# Patient Record
Sex: Female | Born: 1975 | Race: White | Hispanic: No | Marital: Married | Smoking: Current some day smoker
Health system: Southern US, Community
[De-identification: ages and names within clinical notes are randomized; demographics above are authoritative.]

## PROBLEM LIST (undated history)

## (undated) ENCOUNTER — Inpatient Hospital Stay (HOSPITAL_COMMUNITY): Payer: Self-pay

## (undated) DIAGNOSIS — E119 Type 2 diabetes mellitus without complications: Secondary | ICD-10-CM

## (undated) DIAGNOSIS — R569 Unspecified convulsions: Secondary | ICD-10-CM

## (undated) HISTORY — DX: Type 2 diabetes mellitus without complications: E11.9

---

## 2012-07-09 ENCOUNTER — Emergency Department: Payer: Self-pay | Admitting: Emergency Medicine

## 2012-08-19 ENCOUNTER — Encounter (HOSPITAL_COMMUNITY): Payer: Self-pay | Admitting: Emergency Medicine

## 2012-08-19 ENCOUNTER — Emergency Department (HOSPITAL_COMMUNITY)
Admission: EM | Admit: 2012-08-19 | Discharge: 2012-08-19 | Disposition: A | Discharge status: Home / Self Care | Payer: Medicare Other | Attending: Emergency Medicine | Admitting: Emergency Medicine

## 2012-08-19 ENCOUNTER — Emergency Department (HOSPITAL_COMMUNITY): Payer: Medicare Other

## 2012-08-19 DIAGNOSIS — R110 Nausea: Secondary | ICD-10-CM | POA: Insufficient documentation

## 2012-08-19 DIAGNOSIS — Z3490 Encounter for supervision of normal pregnancy, unspecified, unspecified trimester: Secondary | ICD-10-CM

## 2012-08-19 DIAGNOSIS — R109 Unspecified abdominal pain: Secondary | ICD-10-CM

## 2012-08-19 DIAGNOSIS — Z8669 Personal history of other diseases of the nervous system and sense organs: Secondary | ICD-10-CM | POA: Insufficient documentation

## 2012-08-19 DIAGNOSIS — Z794 Long term (current) use of insulin: Secondary | ICD-10-CM | POA: Insufficient documentation

## 2012-08-19 DIAGNOSIS — Z3201 Encounter for pregnancy test, result positive: Secondary | ICD-10-CM | POA: Insufficient documentation

## 2012-08-19 DIAGNOSIS — O9989 Other specified diseases and conditions complicating pregnancy, childbirth and the puerperium: Secondary | ICD-10-CM | POA: Insufficient documentation

## 2012-08-19 HISTORY — DX: Unspecified convulsions: R56.9

## 2012-08-19 LAB — COMPREHENSIVE METABOLIC PANEL
BUN: 14 mg/dL (ref 6–23)
CO2: 20 mEq/L (ref 19–32)
Calcium: 9 mg/dL (ref 8.4–10.5)
Creatinine, Ser: 0.5 mg/dL (ref 0.50–1.10)
GFR calc Af Amer: >90 mL/min (ref >90)
GFR calc non Af Amer: >90 mL/min (ref >90)
Glucose, Bld: 277 mg/dL — ABNORMAL HIGH (ref 70–99)

## 2012-08-19 LAB — CBC WITH DIFFERENTIAL/PLATELET
Basophils Absolute: 0 10*3/uL (ref 0.0–0.1)
Eosinophils Relative: 1 % (ref 0–5)
HCT: 37.1 % (ref 36.0–46.0)
Lymphocytes Relative: 32 % (ref 12–46)
MCV: 80.1 fL (ref 78.0–100.0)
Monocytes Absolute: 0.5 10*3/uL (ref 0.1–1.0)
Monocytes Relative: 4 % (ref 3–12)
RDW: 12.6 % (ref 11.5–15.5)
WBC: 12.4 10*3/uL — ABNORMAL HIGH (ref 4.0–10.5)

## 2012-08-19 LAB — URINE MICROSCOPIC-ADD ON

## 2012-08-19 LAB — URINALYSIS, ROUTINE W REFLEX MICROSCOPIC
Nitrite: NEGATIVE
Protein, ur: 100 mg/dL — AB
Specific Gravity, Urine: 1.037 — ABNORMAL HIGH (ref 1.005–1.030)
Urobilinogen, UA: 0.2 mg/dL (ref 0.0–1.0)

## 2012-08-19 LAB — POCT PREGNANCY, URINE: Preg Test, Ur: POSITIVE — AB

## 2012-08-19 MED ORDER — ONDANSETRON HCL 4 MG/2ML IJ SOLN
4.0000 mg | Freq: Once | INTRAMUSCULAR | Status: AC
  Administered 2012-08-19: 4 mg via INTRAVENOUS
  Filled 2012-08-19: qty 2

## 2012-08-19 MED ORDER — SODIUM CHLORIDE 0.9 % IV BOLUS (SEPSIS)
1000.0000 mL | Freq: Once | INTRAVENOUS | Status: AC
  Administered 2012-08-19: 1000 mL via INTRAVENOUS

## 2012-08-19 MED ORDER — ACETAMINOPHEN-CODEINE #3 300-30 MG PO TABS: 1.0000 | ORAL_TABLET | Freq: Four times a day (QID) | ORAL | Status: DC | PRN

## 2012-08-19 MED ORDER — MORPHINE SULFATE 4 MG/ML IJ SOLN
4.0000 mg | Freq: Once | INTRAMUSCULAR | Status: AC
  Administered 2012-08-19: 4 mg via INTRAVENOUS
  Filled 2012-08-19: qty 1

## 2012-08-19 MED ORDER — ONDANSETRON 8 MG PO TBDP: 8.0000 mg | ORAL_TABLET | Freq: Three times a day (TID) | ORAL | Status: DC | PRN

## 2012-08-19 NOTE — ED Notes (Signed)
Pt c/o right sided headach for 3 days, unrelieved by ibuprofen. Pt also c/o right side abd pain, tender to palpation. Pt states she has vomited 2 times this morning. C/o mild nausea at this time.

## 2012-08-19 NOTE — ED Notes (Signed)
Pt advised of the wait time 

## 2012-08-19 NOTE — ED Provider Notes (Signed)
History     CSN: [id number]  Arrival date & time 08/19/12  1429   First MD Initiated Contact with Patient 08/19/12 1727      Chief Complaint  Patient presents with  . Abdominal Pain    (Consider location/radiation/quality/duration/timing/severity/associated sxs/prior treatment) HPI Comments: Pt comes in with cc of abd pain. Pt has hx of RUQ abd pain, started few days back - but now constant. There is some nausea, no emesis, and no uti like sx, no hx of renal stones, GERD, surgeries. The pain has bo specific aggravating or relieving factors. No hx of gall stones.  Patient is a 37 y.o. female presenting with abdominal pain. The history is provided by the patient.  Abdominal Pain Associated symptoms: nausea   Associated symptoms: no chest pain, no diarrhea, no dysuria, no shortness of breath and no vomiting     Past Medical History  Diagnosis Date  . Seizures     History reviewed. No pertinent past surgical history.  History reviewed. No pertinent family history.  History  Substance Use Topics  . Smoking status: Never Smoker   . Smokeless tobacco: Not on file  . Alcohol Use: No    OB History   Grav Para Term Preterm Abortions TAB SAB Ect Mult Living                  Review of Systems  Constitutional: Negative for activity change.  HENT: Negative for facial swelling and neck pain.   Respiratory: Negative for shortness of breath.   Cardiovascular: Negative for chest pain.  Gastrointestinal: Positive for nausea and abdominal pain. Negative for vomiting and diarrhea.  Genitourinary: Negative for dysuria.  Neurological: Negative for headaches.    Allergies  Dilantin; Levemir; Phenobarbital; Ciprofloxacin; Humalog; Lantus; Novolin 70-30; and Penicillins  Home Medications   Current Outpatient Rx  Name  Route  Sig  Dispense  Refill  . insulin glulisine (APIDRA) 100 UNIT/ML injection   Subcutaneous   Inject 30 Units into the skin 2 (two) times daily after a  meal.           BP 141/91  Pulse 109  Temp(Src) 98.5 F (36.9 C) (Oral)  Resp 18  SpO2 96%  Physical Exam  Nursing note and vitals reviewed. Constitutional: She is oriented to person, place, and time. She appears well-developed and well-nourished.  HENT:  Head: Normocephalic and atraumatic.  Eyes: EOM are normal. Pupils are equal, round, and reactive to light.  Neck: Neck supple.  Cardiovascular: Normal rate, regular rhythm and normal heart sounds.   No murmur heard. Pulmonary/Chest: Effort normal. No respiratory distress.  Abdominal: Soft. She exhibits no distension. There is tenderness. There is no rebound and no guarding.  RUQ tenderness, no murphy's.  Neurological: She is alert and oriented to person, place, and time.  Skin: Skin is warm and dry.    ED Course  Procedures (including critical care time)  Labs Reviewed  CBC WITH DIFFERENTIAL - Abnormal; Notable for the following:    WBC 12.4 (*)    Neutro Abs 7.8 (*)    All other components within normal limits  COMPREHENSIVE METABOLIC PANEL - Abnormal; Notable for the following:    Sodium 131 (*)    Glucose, Bld 277 (*)    Albumin 3.0 (*)    All other components within normal limits  URINALYSIS, ROUTINE W REFLEX MICROSCOPIC - Abnormal; Notable for the following:    APPearance CLOUDY (*)    Specific Gravity, Urine 1.037 (*)  Glucose, UA >1000 (*)    Protein, ur 100 (*)    All other components within normal limits  URINE MICROSCOPIC-ADD ON - Abnormal; Notable for the following:    Squamous Epithelial / LPF MANY (*)    Casts HYALINE CASTS (*)    All other components within normal limits  HCG, QUANTITATIVE, PREGNANCY - Abnormal; Notable for the following:    hCG, Beta Chain, Quant, S 9739 (*)    All other components within normal limits  POCT PREGNANCY, URINE - Abnormal; Notable for the following:    Preg Test, Ur POSITIVE (*)    All other components within normal limits   No results found.   No diagnosis  found.    MDM  DDx includes: Pancreatitis Hepatobiliary pathology including cholecystitis Gastritis/PUD SBO  Pt comes in with cc of RUQ pain. Her Upreg is also positive. She was last pregnant 17 years ago.  We will get [place] abd and pelvis.   Derwood Kaplan, MD 08/19/12 2003

## 2012-08-19 NOTE — ED Notes (Signed)
Pt c/o RLQ pain with N/V and HA x 3 days

## 2012-09-22 DIAGNOSIS — E109 Type 1 diabetes mellitus without complications: Secondary | ICD-10-CM | POA: Insufficient documentation

## 2012-09-22 DIAGNOSIS — G40909 Epilepsy, unspecified, not intractable, without status epilepticus: Secondary | ICD-10-CM | POA: Insufficient documentation

## 2012-09-27 ENCOUNTER — Encounter: Payer: Medicare Other | Admitting: Obstetrics and Gynecology

## 2012-10-04 ENCOUNTER — Encounter: Payer: Medicare Other | Admitting: Advanced Practice Midwife

## 2012-10-23 ENCOUNTER — Emergency Department: Payer: Self-pay | Admitting: Emergency Medicine

## 2012-10-23 LAB — COMPREHENSIVE METABOLIC PANEL
Anion Gap: 8 (ref 7–16)
BUN: 8 mg/dL (ref 7–18)
Bilirubin,Total: 0.3 mg/dL (ref 0.2–1.0)
Calcium, Total: 8.6 mg/dL (ref 8.5–10.1)
Chloride: 103 mmol/L (ref 98–107)
EGFR (Non-African Amer.): >60
Osmolality: 274 (ref 275–301)
SGOT(AST): 13 U/L — ABNORMAL LOW (ref 15–37)
Total Protein: 6.9 g/dL (ref 6.4–8.2)

## 2012-10-23 LAB — URINALYSIS, COMPLETE
Glucose,UR: >=500 mg/dL (ref 0–75)
Nitrite: NEGATIVE
Ph: 6 (ref 4.5–8.0)
Protein: NEGATIVE
WBC UR: 2 /HPF (ref 0–5)

## 2012-10-23 LAB — CBC
HCT: 34 % — ABNORMAL LOW (ref 35.0–47.0)
Platelet: 367 10*3/uL (ref 150–440)

## 2012-10-25 ENCOUNTER — Other Ambulatory Visit (HOSPITAL_COMMUNITY)
Admission: RE | Admit: 2012-10-25 | Discharge: 2012-10-25 | Disposition: A | Discharge status: Home / Self Care | Payer: Medicare Other | Source: Ambulatory Visit | Attending: Obstetrics & Gynecology | Admitting: Obstetrics & Gynecology

## 2012-10-25 ENCOUNTER — Other Ambulatory Visit: Payer: Self-pay | Admitting: Obstetrics & Gynecology

## 2012-10-25 ENCOUNTER — Encounter: Payer: Self-pay | Admitting: Obstetrics & Gynecology

## 2012-10-25 ENCOUNTER — Encounter (HOSPITAL_COMMUNITY): Payer: Self-pay | Admitting: Obstetrics & Gynecology

## 2012-10-25 ENCOUNTER — Ambulatory Visit (INDEPENDENT_AMBULATORY_CARE_PROVIDER_SITE_OTHER): Payer: Medicaid Other | Admitting: Obstetrics & Gynecology

## 2012-10-25 VITALS — BP 150/97 | Temp 97.3°F | Ht 63.0 in | Wt 194.2 lb

## 2012-10-25 DIAGNOSIS — O10919 Unspecified pre-existing hypertension complicating pregnancy, unspecified trimester: Secondary | ICD-10-CM | POA: Insufficient documentation

## 2012-10-25 DIAGNOSIS — O26899 Other specified pregnancy related conditions, unspecified trimester: Secondary | ICD-10-CM | POA: Insufficient documentation

## 2012-10-25 DIAGNOSIS — IMO0002 Reserved for concepts with insufficient information to code with codable children: Secondary | ICD-10-CM | POA: Insufficient documentation

## 2012-10-25 DIAGNOSIS — O09529 Supervision of elderly multigravida, unspecified trimester: Secondary | ICD-10-CM

## 2012-10-25 DIAGNOSIS — Z01419 Encounter for gynecological examination (general) (routine) without abnormal findings: Secondary | ICD-10-CM | POA: Insufficient documentation

## 2012-10-25 DIAGNOSIS — Z113 Encounter for screening for infections with a predominantly sexual mode of transmission: Secondary | ICD-10-CM | POA: Insufficient documentation

## 2012-10-25 DIAGNOSIS — O0992 Supervision of high risk pregnancy, unspecified, second trimester: Secondary | ICD-10-CM

## 2012-10-25 DIAGNOSIS — O34219 Maternal care for unspecified type scar from previous cesarean delivery: Secondary | ICD-10-CM | POA: Insufficient documentation

## 2012-10-25 DIAGNOSIS — E118 Type 2 diabetes mellitus with unspecified complications: Secondary | ICD-10-CM

## 2012-10-25 DIAGNOSIS — O219 Vomiting of pregnancy, unspecified: Secondary | ICD-10-CM

## 2012-10-25 DIAGNOSIS — B9789 Other viral agents as the cause of diseases classified elsewhere: Secondary | ICD-10-CM

## 2012-10-25 DIAGNOSIS — O24312 Unspecified pre-existing diabetes mellitus in pregnancy, second trimester: Secondary | ICD-10-CM

## 2012-10-25 DIAGNOSIS — O9989 Other specified diseases and conditions complicating pregnancy, childbirth and the puerperium: Secondary | ICD-10-CM

## 2012-10-25 DIAGNOSIS — O10912 Unspecified pre-existing hypertension complicating pregnancy, second trimester: Secondary | ICD-10-CM

## 2012-10-25 DIAGNOSIS — O10019 Pre-existing essential hypertension complicating pregnancy, unspecified trimester: Secondary | ICD-10-CM

## 2012-10-25 DIAGNOSIS — O9853 Other viral diseases complicating the puerperium: Secondary | ICD-10-CM

## 2012-10-25 DIAGNOSIS — O24919 Unspecified diabetes mellitus in pregnancy, unspecified trimester: Secondary | ICD-10-CM

## 2012-10-25 DIAGNOSIS — Z1151 Encounter for screening for human papillomavirus (HPV): Secondary | ICD-10-CM | POA: Insufficient documentation

## 2012-10-25 DIAGNOSIS — IMO0001 Reserved for inherently not codable concepts without codable children: Secondary | ICD-10-CM

## 2012-10-25 DIAGNOSIS — R519 Headache, unspecified: Secondary | ICD-10-CM

## 2012-10-25 DIAGNOSIS — O26892 Other specified pregnancy related conditions, second trimester: Secondary | ICD-10-CM

## 2012-10-25 DIAGNOSIS — O24319 Unspecified pre-existing diabetes mellitus in pregnancy, unspecified trimester: Secondary | ICD-10-CM | POA: Insufficient documentation

## 2012-10-25 DIAGNOSIS — O09522 Supervision of elderly multigravida, second trimester: Secondary | ICD-10-CM

## 2012-10-25 LAB — CBC
MCH: 28.6 pg (ref 26.0–34.0)
MCV: 82.5 fL (ref 78.0–100.0)
Platelets: 380 10*3/uL (ref 150–400)
RBC: 4.05 MIL/uL (ref 3.87–5.11)
RDW: 13.7 % (ref 11.5–15.5)

## 2012-10-25 LAB — POCT URINALYSIS DIP (DEVICE)
Bilirubin Urine: NEGATIVE
Glucose, UA: 500 mg/dL — AB
Leukocytes, UA: NEGATIVE
Nitrite: NEGATIVE
Urobilinogen, UA: 0.2 mg/dL (ref 0.0–1.0)

## 2012-10-25 LAB — HEMOGLOBIN A1C: Hgb A1c MFr Bld: 10 % — ABNORMAL HIGH (ref <5.7)

## 2012-10-25 MED ORDER — BUTALBITAL-APAP-CAFFEINE 50-500-40 MG PO TABS: 1.0000 | ORAL_TABLET | ORAL | Status: DC | PRN

## 2012-10-25 MED ORDER — ONDANSETRON 8 MG PO TBDP: 8.0000 mg | ORAL_TABLET | Freq: Three times a day (TID) | ORAL | Status: DC | PRN

## 2012-10-25 NOTE — Progress Notes (Signed)
Nutrition note: 1st visit consult Pt has h/o diabetes and obesity. Pt has gained 10.2# @ [redacted]w[redacted]d, which is > expected. Pt reports eating 3 meals & 3 snacks/d. Pt is taking PNV. Pt reports having N&V and heartburn. NKFA. Pt reports walking most days.  Pt received verbal & written education on general nutrition during pregnancy. Disc tips to decrease N&V and heartburn. Disc wt gain goals of 11-20# or 0.5#/wk.  Pt agrees to follow DM diet with 3 meals & 3 snacks including proper CHO/ protein combination.  Pt has WIC in [place] & plans to BF. F/u in 2-4 wks Blondell Reveal, MS, RD, LDN

## 2012-10-25 NOTE — Progress Notes (Signed)
  Subjective:    Kristy Mcmillan is a 37 y.o. G2P1001 [redacted]w[redacted]d being seen today for her first obstetrical visit.  Her obstetrical history is significant for Type II diabetes diagnosed in 1996 after her previous pregnancy. She reports being allergic to many forms of insulin including NPH, Lantus, Novolog, Humalog or regular insulin.  Only tolerates Apidra (insulin glulisine) which is rapid acting like Novolog, as per [facility] May our RDE.   No current endocrinologist or PCP, she takes 30 units of Apidra twice a day and has variable range of blood sugars.  Also reports hisotry of headaches/migraines, only alleviated by Tylenol #3, wants a Rx of this..  Also wants Zofran as needed for nausea.  Patient does intend to breast feed. Pregnancy history fully reviewed.  Patient Active Problem List   Diagnosis Date Noted  . Preexisting diabetes complicating pregnancy, antepartum 10/25/2012  . Chronic hypertension complicating pregnancy, antepartum 10/25/2012  . Advanced maternal age, antepartum 10/25/2012  . Previous cesarean section complicating pregnancy, antepartum condition or complication 10/25/2012    HISTORY: OB History   Grav Para Term Preterm Abortions TAB SAB Ect Mult Living   2 1 1       1      # Outc Date GA Lbr Len/2nd Wgt Sex Del Anes PTL Lv   1 TRM 11/96 [redacted]w[redacted]d  6lb2oz(2.778kg)   None No Yes   2 CUR              Past Medical History  Diagnosis Date  . Seizures   . Diabetes mellitus without complication    Past Surgical History  Procedure Laterality Date  . Cesarean section     Family History  Problem Relation Age of Onset  . Diabetes Mother   . Diabetes Father    Exam    Uterus:     Pelvic Exam:    Perineum: No Hemorrhoids, Normal Perineum   Vulva: normal   Vagina:  normal mucosa, normal discharge   Cervix: no bleeding following Pap   Adnexa: normal adnexa and no mass, fullness, tenderness   Bony Pelvis: gynecoid  System: Breast:  normal appearance, no masses or tenderness    Skin: normal coloration and turgor, no rashes   Neurologic: oriented, normal   Extremities: normal strength, tone, and muscle mass   HEENT PERRLA   Mouth/Teeth mucous membranes moist, pharynx normal without lesions and dental hygiene good   Neck supple and no masses   Cardiovascular: regular rate and rhythm   Respiratory:  appears well, vitals normal, no respiratory distress, acyanotic, normal RR, chest clear, no wheezing, crepitations, rhonchi, normal symmetric air entry   Abdomen: soft, non-tender; bowel sounds normal; no masses,  no organomegaly   Urinary: urethral meatus normal      Assessment:    Pregnancy: G2P1001 Patient Active Problem List   Diagnosis Date Noted  . Preexisting diabetes complicating pregnancy, antepartum 10/25/2012  . Chronic hypertension complicating pregnancy, antepartum 10/25/2012  . Advanced maternal age, antepartum 10/25/2012  . Previous cesarean section complicating pregnancy, antepartum condition or complication 10/25/2012     Plan:     Initial labs drawn.  Continue Prenatal vitamins. Problem list reviewed and updated. Referred to Endocrinology for help in treating her DM given her allergies; may need insulin pump with Apidra if that is all she can tolerate.   Genetic Screening discussed; Harmony test ordered at MFM Ultrasound discussed; fetal survey: ordered. Follow up in 2 weeks.    Kristy Mcmillan A 10/25/2012

## 2012-10-25 NOTE — Progress Notes (Signed)
Patient checked her sugar this morning and it was 229.  She has a high blood pressure reading this morning and is c/o of extreme dizziness and extreme shaking.  HR= 107

## 2012-10-25 NOTE — Progress Notes (Signed)
ML on [facility] VM to call to set up an appointment for this patient.([phone])MFM appointment for [facility] and U/S scheduled 11/12/12 at 215 pm. Patient given contact information for Fetal Echo and Endocrinology and advised she will be called with these appointments. Patient given an appointment with Dr. Elvera Lennox ([place]) for 10/26/12 at 3 pm.

## 2012-10-25 NOTE — Patient Instructions (Addendum)
Vaginal Birth After Cesarean Delivery Vaginal birth after Cesarean delivery (VBAC) is giving birth vaginally after previously delivering a baby by a cesarean. In the past, if a woman had a Cesarean delivery, all births afterwards would be done by Cesarean delivery. This is no longer true. It can be safe for the mother to try a vaginal delivery after having a Cesarean. The final decision to have a VBAC or repeat Cesarean delivery should be between the patient and her caregiver. The risks and benefits can be discussed relative to the reason for, and the type of the previous Cesarean delivery. WOMEN WHO PLAN TO HAVE A VBAC SHOULD CHECK WITH THEIR DOCTOR TO BE SURE THAT:  The previous Cesarean was done with a low transverse uterine incision (not a vertical classical incision).  The birth canal is big enough for the baby.  There were no other operations on the uterus.  They will have an electronic fetal monitor (EFM) on at all times during labor.  An operating room would be available and ready in case an emergency Cesarean is needed.  A doctor and surgical nursing staff would be available at all times during labor to be ready to do an emergency Cesarean if necessary.  An anesthesiologist would be present in case an emergency Cesarean is needed.  The nursery is prepared and has adequate personnel and necessary equipment available to care for the baby in case of an emergency Cesarean. BENEFITS OF VBAC:  Shorter stay in the hospital.  Lower delivery, nursery and hospital costs.  Less blood loss and need for blood transfusions.  Less fever and discomfort from major surgery.  Lower risk of blood clots.  Lower risk of infection.  Shorter recovery after going home.  Lower risk of other surgical complications, such as opening of the incision or hernia in the incision.  Decreased risk of injury to other organs.  Decreased risk for having to remove the uterus (hysterectomy).  Decreased risk  for the placenta to completely or partially cover the opening of the uterus (placenta previa) with a future pregnancy.  Ability to have a larger family if desired. RISKS OF A VBAC:  Rupture of the uterus.  Having to remove the uterus (hysterectomy) if it ruptures.  All the complications of major surgery and/or injury to other organs.  Excessive bleeding, blood clots and infection.  Lower Apgar scores (method to evaluate the newborn based on appearance, pulse, grimace, activity, and respiration) and more risks to the baby.  There is a higher risk of uterine rupture if you induce or augment labor.  There is a higher risk of uterine rupture if you use medications to ripen the cervix. VBAC SHOULD NOT BE DONE IF:  The previous Cesarean was done with a vertical (classical) or T-shaped incision, or you do not know what kind of an incision was made.  You had a ruptured uterus.  You had surgery on your uterus.  You have medical or obstetrical problems.  There are problems with the baby.  There were two previous Cesarean deliveries and no vaginal deliveries. OTHER FACTS TO KNOW ABOUT VBAC:  It is safe to have an epidural anesthetic with VBAC.  It is safe to turn the baby from a breech position (attempt an external cephalic version).  It is safe to try a VBAC with twins.  Pregnancies later than 40 weeks have not been successful with VBAC.  There is an increased failure rate of a VBAC in obese pregnant women.  There is  an increased failure rate with VABC if the baby weighs 8.8 pounds (4000 grams) or more.  There is an increased failure rate if the time between the Cesarean and VBAC is less than 19 months.  There is an increased failure rate if pre-eclampsia is present (high blood pressure, protein in the urine and swelling of face and extremities).  VBAC is very successful if there was a previous vaginal birth.  VBAC is very successful when the labor starts spontaneously before  the due date.  Delivery of VBAC is similar to having a normal spontaneous vaginal delivery. It is important to discuss VBAC with your caregiver early in the pregnancy so you can understand the risks, benefits and options. It will give you time to decide what is best in your particular case relevant to the reason for your previous Cesarean delivery. It should be understood that medical changes in the mother or pregnancy may occur during the pregnancy, which make it necessary to change you or your caregiver's initial decision. The counseling, concerns and decisions should be documented in the medical record and signed by all parties. Document Released: 12/07/2006 Document Revised: 09/08/2011 Document Reviewed: 07/28/2008 [facility] Patient Information 2013 [place], [place].   Hypertension During Pregnancy Hypertension is also called high blood pressure. It can occur at any time in life and during pregnancy. When you have hypertension, there is extra pressure inside your blood vessels that carry blood from the heart to the rest of your body (arteries). Hypertension during pregnancy can cause problems for you and your baby. Your baby might not weigh as much as it should at birth or might be born early (premature). Very bad cases of hypertension during pregnancy can be life-threatening.  There are different types of hypertension during pregnancy.   Chronic hypertension. This happens when a woman has hypertension before pregnancy and it continues during pregnancy.  Gestational hypertension. This is when hypertension develops during pregnancy.  Preeclampsia or toxemia of pregnancy. This is a very serious type of hypertension that develops only during pregnancy. It is a disease that affects the whole body (systemic) and can be very dangerous for both mother and baby.  Gestational hypertension and preeclampsia usually go away after your baby is born. Blood pressure generally stabilizes within 6 weeks. Women  who have hypertension during pregnancy have a greater chance of developing hypertension later in life or with future pregnancies. UNDERSTANDING BLOOD PRESSURE Blood pressure moves blood in your body. Sometimes, the force that moves the blood becomes too strong.  A blood pressure reading is given in 2 numbers and looks like a fraction.  The top number is called the systolic pressure. When your heart beats, it forces more blood to flow through the arteries. Pressure inside the arteries goes up.  The bottom number is the diastolic pressure. Pressure goes down between beats. That is when the heart is resting.  You may have hypertension if:  Your systolic blood pressure is above 140.  Your diastolic pressure is above 90. RISK FACTORS Some factors make you more likely to develop hypertension during pregnancy. Risk factors include:  Having hypertension before pregnancy.  Having hypertension during a previous pregnancy.  Being overweight.  Being older than 40.  Being pregnant with more than 1 baby (multiples).  Having diabetes or kidney problems. SYMPTOMS Chronic and gestational hypertension may not cause symptoms. Preeclampsia has symptoms, which may include:  Increased protein in your urine. Your caregiver will check for this at every prenatal visit.  Swelling of your  hands and face.  Rapid weight gain.  Headaches.  Visual changes.  Being bothered by light.  Abdominal pain, especially in the right upper area.  Chest pain.  Shortness of breath.  Increased reflexes.  Seizures. Seizures occur with a more severe form of preeclampsia, called eclampsia. DIAGNOSIS   You may be diagnosed with hypertension during pregnancy during a regular prenatal exam. At each visit, tests may include:  Blood pressure checks.  A urine test to check for protein in your urine.  The type of hypertension you are diagnosed with depends on when you developed it. It also depends on your  specific blood pressure reading.  Developing hypertension before 20 weeks of pregnancy is consistent with chronic hypertension.  Developing hypertension after 20 weeks of pregnancy is consistent with gestational hypertension.  Hypertension with increased urinary protein is diagnosed as preeclampsia.  Blood pressure measurements that stay above 160 systolic or 110 diastolic are a sign of severe preeclampsia. TREATMENT Treatment for hypertension during pregnancy varies. Treatment depends on the type of hypertension and how serious it is.  If you take medicine for chronic hypertension, you may need to switch medicines.  Drugs called ACE inhibitors should not be taken during pregnancy.  Low-dose aspirin may be suggested for women who have risk factors for preeclampsia.  If you have gestational hypertension, you may need to take a blood pressure medicine that is safe during pregnancy. Your caregiver will recommend the appropriate medicine.  If you have severe preeclampsia, you may need to be in the hospital. Caregivers will watch you and the baby very closely. You also may need to take medicine (magnesium sulfate) to prevent seizures and lower blood pressure.  Sometimes an early delivery is needed. This may be the case if the condition worsens. It would be done to protect you and the baby. The only cure for preeclampsia is delivery. HOME CARE INSTRUCTIONS  Schedule and keep all of your regular prenatal care.  Follow your caregiver's instructions for taking medicines. Tell your caregiver about all medicines you take. This includes over-the-counter medicines.  Eat as little salt as possible.  Get regular exercise.  Do not drink alcohol.  Do not use tobacco products.  Do not drink products with caffeine.  Lie on your left side when resting.  Tell your doctor if you have any preeclampsia symptoms. SEEK IMMEDIATE MEDICAL CARE IF:  You have severe abdominal pain.  You have sudden  swelling in the hands, ankles, or face.  You gain 4 pounds (1.8 kg) or more in 1 week.  You vomit repeatedly.  You have vaginal bleeding.  You do not feel the baby moving as much.  You have a headache.  You have blurred or double vision.  You have muscle twitching or spasms.  You have shortness of breath.  You have blue fingernails and lips.  You have blood in your urine. MAKE SURE YOU:  Understand these instructions.  Will watch your condition.  Will get help right away if you are not doing well. Document Released: 03/04/2011 Document Revised: 09/08/2011 Document Reviewed: 03/04/2011 [facility] Patient Information 2013 [place], [place].

## 2012-10-26 ENCOUNTER — Ambulatory Visit: Payer: Medicaid Other | Admitting: Internal Medicine

## 2012-10-26 ENCOUNTER — Encounter: Payer: Self-pay | Admitting: Obstetrics & Gynecology

## 2012-10-26 LAB — OBSTETRIC PANEL
Antibody Screen: NEGATIVE
Basophils Relative: 0 % (ref 0–1)
Eosinophils Absolute: 0.2 10*3/uL (ref 0.0–0.7)
HCT: 33.4 % — ABNORMAL LOW (ref 36.0–46.0)
Hemoglobin: 11.6 g/dL — ABNORMAL LOW (ref 12.0–15.0)
Hepatitis B Surface Ag: NEGATIVE
Lymphs Abs: 3.2 10*3/uL (ref 0.7–4.0)
MCH: 28.6 pg (ref 26.0–34.0)
MCHC: 34.7 g/dL (ref 30.0–36.0)
Monocytes Absolute: 0.4 10*3/uL (ref 0.1–1.0)
Monocytes Relative: 3 % (ref 3–12)
Neutro Abs: 7.6 10*3/uL (ref 1.7–7.7)
RBC: 4.05 MIL/uL (ref 3.87–5.11)
Rh Type: POSITIVE

## 2012-10-26 LAB — COMPREHENSIVE METABOLIC PANEL
ALT: 9 U/L (ref 0–35)
AST: 8 U/L (ref 0–37)
Alkaline Phosphatase: 46 U/L (ref 39–117)
CO2: 20 mEq/L (ref 19–32)
Creat: 0.59 mg/dL (ref 0.50–1.10)
Sodium: 133 mEq/L — ABNORMAL LOW (ref 135–145)
Total Bilirubin: 0.5 mg/dL (ref 0.3–1.2)
Total Protein: 6.6 g/dL (ref 6.0–8.3)

## 2012-10-27 ENCOUNTER — Telehealth: Payer: Self-pay | Admitting: *Deleted

## 2012-10-27 LAB — CULTURE, OB URINE: Colony Count: 25000

## 2012-10-27 NOTE — Telephone Encounter (Addendum)
Message copied by Jill Side on Wed Oct 27, 2012  2:49 PM ------      Message from: Jaynie Collins A      Created: Tue Oct 26, 2012  9:00 PM       Patient was supposed to see the endocrinologist on 10/26/12 and there is no note, vitals or encounter there as of 2100 on 10/26/12.  Please call patient and find out if she went to this appointment. If not, please advise her to reschedule as the endocrinologist is critical in helping [place] manage her poorly controlled diabetes with hemoglobin A1C of 10.0. ------called pt and phone was answered by her husband. He stated that she is not available right now. I asked him to have Asha call and leave message on the nurse voice mail as to when she can be reached.

## 2012-10-27 NOTE — Telephone Encounter (Addendum)
Message copied by Barbara Cower on Wed Oct 27, 2012 11:55 AM ------      Message from: Jaynie Collins A      Created: Mon Oct 25, 2012 10:42 AM      Regarding: Schedule follow up appointment       Patient seen for initial visit [facility] at [facility]; lives in [place]. Schedule followup with me or Dr.Pratt in two weeks.  Call patient with appointment.  Thanks!            Ugonna ------Patient will set up appointment to be seen sooner.  She is still not feeling well.

## 2012-10-28 NOTE — Telephone Encounter (Signed)
Called pt and left message that we are trying to reach her concerning an appt.  If she could please give [place] a call at the clinics.

## 2012-10-28 NOTE — Telephone Encounter (Signed)
Pt returned call and I asked pt if she went to appt and pt stated that she could not go to the appt due to other events but she has rescheduled for May 15th.  I highly encourage pt to please go to the appt due to having uncontrolled diabetes and being pregnant.  Pt stated understanding and did not have any other questions.

## 2012-11-04 ENCOUNTER — Inpatient Hospital Stay (HOSPITAL_COMMUNITY)
Admission: AD | Admit: 2012-11-04 | Discharge: 2012-11-06 | DRG: 781 | Disposition: A | Discharge status: Home / Self Care | Payer: Medicare Other | Source: Ambulatory Visit | Attending: Obstetrics & Gynecology | Admitting: Obstetrics & Gynecology

## 2012-11-04 ENCOUNTER — Ambulatory Visit (INDEPENDENT_AMBULATORY_CARE_PROVIDER_SITE_OTHER): Payer: Medicare Other | Admitting: Obstetrics & Gynecology

## 2012-11-04 ENCOUNTER — Encounter (HOSPITAL_COMMUNITY): Payer: Self-pay | Admitting: Obstetrics and Gynecology

## 2012-11-04 VITALS — BP 156/91 | Wt 196.0 lb

## 2012-11-04 DIAGNOSIS — E119 Type 2 diabetes mellitus without complications: Secondary | ICD-10-CM | POA: Diagnosis present

## 2012-11-04 DIAGNOSIS — O10019 Pre-existing essential hypertension complicating pregnancy, unspecified trimester: Secondary | ICD-10-CM | POA: Diagnosis present

## 2012-11-04 DIAGNOSIS — O99891 Other specified diseases and conditions complicating pregnancy: Secondary | ICD-10-CM | POA: Diagnosis present

## 2012-11-04 DIAGNOSIS — O0990 Supervision of high risk pregnancy, unspecified, unspecified trimester: Secondary | ICD-10-CM

## 2012-11-04 DIAGNOSIS — Z794 Long term (current) use of insulin: Secondary | ICD-10-CM

## 2012-11-04 DIAGNOSIS — O09529 Supervision of elderly multigravida, unspecified trimester: Secondary | ICD-10-CM

## 2012-11-04 DIAGNOSIS — O1002 Pre-existing essential hypertension complicating childbirth: Secondary | ICD-10-CM

## 2012-11-04 DIAGNOSIS — O09522 Supervision of elderly multigravida, second trimester: Secondary | ICD-10-CM

## 2012-11-04 DIAGNOSIS — O0992 Supervision of high risk pregnancy, unspecified, second trimester: Secondary | ICD-10-CM

## 2012-11-04 DIAGNOSIS — O24919 Unspecified diabetes mellitus in pregnancy, unspecified trimester: Principal | ICD-10-CM | POA: Diagnosis present

## 2012-11-04 DIAGNOSIS — O10912 Unspecified pre-existing hypertension complicating pregnancy, second trimester: Secondary | ICD-10-CM

## 2012-11-04 DIAGNOSIS — R51 Headache: Secondary | ICD-10-CM | POA: Diagnosis present

## 2012-11-04 DIAGNOSIS — O219 Vomiting of pregnancy, unspecified: Secondary | ICD-10-CM

## 2012-11-04 DIAGNOSIS — O34219 Maternal care for unspecified type scar from previous cesarean delivery: Secondary | ICD-10-CM | POA: Diagnosis present

## 2012-11-04 LAB — GLUCOSE, CAPILLARY: Glucose-Capillary: 148 mg/dL — ABNORMAL HIGH (ref 70–99)

## 2012-11-04 MED ORDER — ACETAMINOPHEN-CODEINE #3 300-30 MG PO TABS: 1.0000 | ORAL_TABLET | Freq: Four times a day (QID) | ORAL | Status: DC | PRN

## 2012-11-04 MED ORDER — LABETALOL HCL 100 MG PO TABS: 200.0000 mg | ORAL_TABLET | Freq: Two times a day (BID) | ORAL | Status: DC

## 2012-11-04 MED ORDER — INSULIN ASPART 100 UNIT/ML ~~LOC~~ SOLN
0.0000 [IU] | Freq: Three times a day (TID) | SUBCUTANEOUS | Status: DC
  Administered 2012-11-05: 4 [IU] via SUBCUTANEOUS

## 2012-11-04 MED ORDER — PRENATAL MULTIVITAMIN CH
1.0000 | ORAL_TABLET | Freq: Every day | ORAL | Status: DC
  Administered 2012-11-05 – 2012-11-06 (×2): 1 via ORAL
  Filled 2012-11-04 (×2): qty 1

## 2012-11-04 MED ORDER — DOCUSATE SODIUM 100 MG PO CAPS
100.0000 mg | ORAL_CAPSULE | Freq: Every day | ORAL | Status: DC
  Filled 2012-11-04: qty 1

## 2012-11-04 MED ORDER — METFORMIN HCL 500 MG PO TABS
500.0000 mg | ORAL_TABLET | Freq: Two times a day (BID) | ORAL | Status: DC
  Administered 2012-11-05: 500 mg via ORAL
  Filled 2012-11-04: qty 1

## 2012-11-04 MED ORDER — CALCIUM CARBONATE ANTACID 500 MG PO CHEW: 2.0000 | CHEWABLE_TABLET | ORAL | Status: DC | PRN

## 2012-11-04 MED ORDER — INSULIN ASPART 100 UNIT/ML ~~LOC~~ SOLN: 6.0000 [IU] | Freq: Three times a day (TID) | SUBCUTANEOUS | Status: DC

## 2012-11-04 MED ORDER — ONDANSETRON 4 MG PO TBDP: 4.0000 mg | ORAL_TABLET | Freq: Three times a day (TID) | ORAL | Status: DC | PRN

## 2012-11-04 MED ORDER — ZOLPIDEM TARTRATE 5 MG PO TABS
5.0000 mg | ORAL_TABLET | Freq: Every evening | ORAL | Status: DC | PRN
  Administered 2012-11-05: 5 mg via ORAL
  Filled 2012-11-04: qty 1

## 2012-11-04 MED ORDER — ACETAMINOPHEN 325 MG PO TABS
650.0000 mg | ORAL_TABLET | ORAL | Status: DC | PRN
  Administered 2012-11-04 – 2012-11-05 (×3): 650 mg via ORAL
  Filled 2012-11-04 (×3): qty 2

## 2012-11-04 NOTE — H&P (Signed)
Kristy Mcmillan is a 37 y.o. female presenting for  Diabetic control, diabetes education, diet education, dental consult re: caries, infected. History  Patient with unique diabetic history, with alleged "allergies" to multiple insulin regimens including lantus, nph,   With pt having a rash with the medicines. No seizures or anaphylactic responses.  Patient history challenging to elicit  Current management on Apidra is atypical, as pt takes it twice a day , not with meals, and has the expected low blood sugars after taking it,when higher doses (30u)taken, so pt has reduced doses to 13 units bid to eliminate hypoglycemia. She describes her goal as keeping CBG below 200.  Since Apidra not aviailable in hospital will substitute Novolog 6 units with meals and monitor response.  Will begin Metformin 500  Bid also. Pt desribes prior use of Metformin with associated loose bm's    OB History   Grav Para Term Preterm Abortions TAB SAB Ect Mult Living   2 1 1       1      Past Medical History  Diagnosis Date  . Seizures   . Diabetes mellitus without complication    Past Surgical History  Procedure Laterality Date  . Cesarean section    allegedly had seizure then taken for cesarean Family History: family history includes Diabetes in her father and mother. Social History:  reports that she has never smoked. She does not have any smokeless tobacco history on file. She reports that she does not drink alcohol or use illicit drugs.   ROS + for dental caries, that are frequently infected and drain. Currently mild soreness left upper molar where tooth broken off    Blood pressure 131/83, pulse 11, temperature 98.2 F (36.8 C), temperature source Oral, resp. rate 18, height 5\' 3"  (1.6 m), weight 196 lb (88.905 kg), last menstrual period 06/25/2012, SpO2 100.00%. Exam Physical Exam  Physical Examination: General appearance - alert, well appearing, and in no distress, oriented to person, place, and time,  overweight  Mental status - alert, oriented to person, place, and time, slow affect, with limited understanding of diabetes, medical care of self Eyes - pupils equal and reactive, extraocular eye movements intact Mouth - mucous membranes moist, pharynx normal without lesions, dental hygiene poor and broken caries at gum on left upper and right upper molar, numerous missing molars Neck - supple, no significant adenopathy bruise 2 cm under chin in midline, attributed to fall in shower. Lymphatics -  Chest - clear to auscultation, no wheezes, rales or rhonchi, symmetric air entry Heart - normal rate and regular rhythm Abdomen - soft, nontender, nondistended, no masses or organomegaly Obesity limits exam : pt notes llq pain lateral to ant superior iliac crest that she thinks is the baby moving ( unlikely) Breasts - bruising on left upper breast from recent fall in shower. Pelvic - examination not indicated Extremities - peripheral pulses normal, no pedal edema, no clubbing or cyanosis,  No injuries or bruising Skin - bruise under chin, on upper left breast, and an injection bruise on upper abd on left  Prenatal labs: ABO, Rh: O/POS/-- (04/28 1207) Antibody: NEG (04/28 1207) Rubella: 5.66 (04/28 1207) RPR: NON REAC (04/28 1207)  HBsAg: NEGATIVE (04/28 1207)  HIV: NON REACTIVE (04/28 1207)  GBS:     Assessment/Plan : Class C diabetes mellitus, poorly controlled Pregnancy 15+weeks Poor medical understanding of diabetes Poor diabetic compliance Alleged insulin intolerances due to rash Poor dentition with caries, infected right upper molar chronic  Plan:  Begin Novolog 6 units with meals, with SSI backup Begin Metformin 500 bid 10a/ hs Diabetic educator Consult made to Dr Everardo All, DDS .Message left Dietary consult.  Kristy Mcmillan V 11/04/2012, 8:15 PM

## 2012-11-04 NOTE — Progress Notes (Signed)
P - 110 - Pt states she has bad headache and the caffeine tabs that were prescribed are making headaches worse

## 2012-11-04 NOTE — Patient Instructions (Addendum)
Return to clinic for any obstetric concerns or go to [facility] for evaluation  

## 2012-11-05 ENCOUNTER — Encounter (HOSPITAL_COMMUNITY): Payer: Self-pay | Admitting: *Deleted

## 2012-11-05 LAB — RAPID URINE DRUG SCREEN, HOSP PERFORMED
Amphetamines: NOT DETECTED
Barbiturates: NOT DETECTED
Benzodiazepines: NOT DETECTED
Tetrahydrocannabinol: NOT DETECTED

## 2012-11-05 LAB — HEMOGLOBIN A1C: Hgb A1c MFr Bld: 8.5 % — ABNORMAL HIGH (ref <5.7)

## 2012-11-05 LAB — GLUCOSE, CAPILLARY
Glucose-Capillary: 200 mg/dL — ABNORMAL HIGH (ref 70–99)
Glucose-Capillary: 144 mg/dL — ABNORMAL HIGH (ref 70–99)

## 2012-11-05 MED ORDER — INSULIN ASPART 100 UNIT/ML ~~LOC~~ SOLN: 12.0000 [IU] | Freq: Three times a day (TID) | SUBCUTANEOUS | Status: DC

## 2012-11-05 MED ORDER — IBUPROFEN 600 MG PO TABS
600.0000 mg | ORAL_TABLET | Freq: Four times a day (QID) | ORAL | Status: DC | PRN
  Administered 2012-11-06 (×2): 600 mg via ORAL
  Filled 2012-11-05 (×3): qty 1

## 2012-11-05 MED ORDER — INSULIN ASPART 100 UNIT/ML ~~LOC~~ SOLN
6.0000 [IU] | Freq: Three times a day (TID) | SUBCUTANEOUS | Status: DC
  Administered 2012-11-05 (×2): 6 [IU] via SUBCUTANEOUS

## 2012-11-05 MED ORDER — INSULIN ASPART 100 UNIT/ML ~~LOC~~ SOLN
4.0000 [IU] | Freq: Three times a day (TID) | SUBCUTANEOUS | Status: DC
  Administered 2012-11-05 – 2012-11-06 (×2): 4 [IU] via SUBCUTANEOUS

## 2012-11-05 MED ORDER — INSULIN NPH (HUMAN) (ISOPHANE) 100 UNIT/ML ~~LOC~~ SUSP
13.0000 [IU] | Freq: Two times a day (BID) | SUBCUTANEOUS | Status: DC
  Administered 2012-11-05 – 2012-11-06 (×2): 13 [IU] via SUBCUTANEOUS
  Filled 2012-11-05: qty 10

## 2012-11-05 MED ORDER — METFORMIN HCL 500 MG PO TABS
500.0000 mg | ORAL_TABLET | Freq: Two times a day (BID) | ORAL | Status: DC
  Filled 2012-11-05 (×2): qty 1

## 2012-11-05 MED ORDER — INSULIN ASPART 100 UNIT/ML ~~LOC~~ SOLN
3.0000 [IU] | Freq: Four times a day (QID) | SUBCUTANEOUS | Status: DC
  Administered 2012-11-05 – 2012-11-06 (×2): 6 [IU] via SUBCUTANEOUS

## 2012-11-05 NOTE — Progress Notes (Signed)
Novolog insulin administered, subcutaneous, right upper arm at 0820.  Patient denies itching or discomfort at this time.  Area is free of redness.  Will continue to monitor.  Osvaldo Angst, RN-----------

## 2012-11-05 NOTE — Progress Notes (Signed)
Patient has a persistent headache and wants Tylenol #3.  Not taking Apidra anymore, self medicating with 40 units twice a day of an old supply of Levemir (which she said gives her headaches and is listed as an allergy).  She missed the appointment scheduled for her with an endocrinologist at [facility].  She is not checking her blood sugars at home.  It is very difficult to talk to her and she does not understand how serious her Class C DM is and her [facility]!!!  Recommended inpatient admission for glycemic and hypertensive control; needs to be seen by Lenor Coffin.  Patient will go to hospital for admission; [facility] staff aware.

## 2012-11-05 NOTE — Progress Notes (Signed)
   Nutrition Dx: Food and nutrition-related knowledge deficit r/t limited comprehension of previous education aeb pt report.  Diet education completed in [facility] on 10/25/12  Nutrition education consult for Carbohydrate Modified Gestational Diabetic Diet completed.  "Meal  plan for gestational diabetics" handout given to patient.  Basic concepts reviewed.  Questions answered.  Patient verbalizes understanding but may not be strictly compliant to diet as she is addiment about consumption of some foods ( sweet tea ) despite being made aware of consequences to her baby.  Elisabeth Cara M.Odis Luster LDN Neonatal Nutrition Support Specialist Pager [phone]

## 2012-11-05 NOTE — Progress Notes (Signed)
Noted CM referral to verify that pt's Medicaid is active,  CM spoke w/ admitting at [facility] who did verify that the Medicaid is active.  Notified pt's Nurse.  CM available to assist as needed.  TJohnson, RNBSN   [phone]

## 2012-11-05 NOTE — Progress Notes (Signed)
Inpatient Diabetes Program Recommendations  Diabetes Treatment Program Recommendations  ADA Standards of Care 2012 Diabetes in Pregnancy Target Glucose Ranges:  Fasting: 60 - 90 mg/dL Preprandial: 60 - 454 mg/dL 1 hr postprandial: Less than 140mg /dL (from first bite of meal) 2 hr postprandial: Less than 120 mg/dL (from first bit of meal)   Reason for Visit: Consult received for medication regimen and education Noted dietician consult has been ordered.  Noted that patient has allergy to all insulins except for apidra. Allergies to the other insulins is typically caused by the zinc; apidra is the only insulin that does not have zinc. I have calculated doses of NPH and analogue should we be able to use without causing a reaction. Spoke with Dr. Penne Lash however and will go to [facility] around 12:45 to assess further the situation.   The other option to be safe would be to use an insulin pump using apidra.  The pump can be pre-set so that pt only has to enter cbg's and food intake.   Will order a case management consult to be sure pt has Medicaid to cover an insulin pump and supplies. Will see pt mid-day and consult with Dr. Penne Lash.   Thank you, Lenor Coffin, RN, CNS, Diabetes Coordinator [phone])

## 2012-11-05 NOTE — Progress Notes (Signed)
FACULTY PRACTICE ANTEPARTUM(COMPREHENSIVE) NOTE  Kristy Mcmillan is a 37 y.o. G2P1001 at [redacted]w[redacted]d by early ultrasound who is admitted for diabetic regulation.    Length of Stay:  1  Days  Subjective: Pt did not receive insulin or metformin last night due to ordering miscommunication. CBG's are without any meds. CBG (last 3)   Recent Labs  11/05/12 0033 11/05/12 0545 11/05/12 0804  GLUCAP 219* 180* 200*     Vitals:  Blood pressure 101/69, pulse 94, temperature 97.5 F (36.4 C), temperature source Oral, resp. rate 18, height 5\' 3"  (1.6 m), weight 91.201 kg (201 lb 1 oz), last menstrual period 06/25/2012, SpO2 97.00%. Physical Examination:  General appearance - alert, well appearing, and in no distress and overweight Fetal Monitoring:  daily fht check ordered  Labs:  Results for orders placed during the hospital encounter of 11/04/12 (from the past 24 hour(s))  GLUCOSE, CAPILLARY   Collection Time    11/04/12  5:43 PM      Result Value Range   Glucose-Capillary 148 (*) 70 - 99 mg/dL  HEMOGLOBIN Z6X   Collection Time    11/04/12  8:30 PM      Result Value Range   Hemoglobin A1C 8.5 (*) <5.7 %   Mean Plasma Glucose 197 (*) <117 mg/dL  GLUCOSE, CAPILLARY   Collection Time    11/05/12 12:33 AM      Result Value Range   Glucose-Capillary 219 (*) 70 - 99 mg/dL  GLUCOSE, CAPILLARY   Collection Time    11/05/12  5:45 AM      Result Value Range   Glucose-Capillary 180 (*) 70 - 99 mg/dL  GLUCOSE, CAPILLARY   Collection Time    11/05/12  8:04 AM      Result Value Range   Glucose-Capillary 200 (*) 70 - 99 mg/dL    Medications:  Scheduled . docusate sodium  100 mg Oral Daily  . insulin aspart  0-20 Units Subcutaneous TID WC  . insulin aspart  6 Units Subcutaneous TID WC  . metFORMIN  500 mg Oral BID  . prenatal multivitamin  1 tablet Oral Q1200   I have reviewed the patient's current medications.  ASSESSMENT: Patient Active Problem List   Diagnosis Date Noted  .  Preexisting diabetes complicating pregnancy, antepartum 10/25/2012  . Chronic hypertension complicating pregnancy, antepartum 10/25/2012  . Advanced maternal age, antepartum 10/25/2012  . Previous cesarean section complicating pregnancy, antepartum condition or complication 10/25/2012  . Headache in pregnancy, antepartum 10/25/2012    PLAN: Insulin novolog 6 units tid c meals, started metformin 500 bid to be taken 8a,1800. 5x daily cbg's ordered  Kristy Mcmillan V 11/05/2012,8:06 AM

## 2012-11-05 NOTE — Progress Notes (Addendum)
Have talked with patient and attended session with dietician.  I am not sure pt will follow a diet and insulin regimen given to her, but all we can do is teach and try. Regarding the "insulin allergy", pt states that sometimes she gets a rash at the injection site, but more importantly she states it makes her headache worse. Pt got 6 units Novolog for lunch today, and there is no rash or reaction at injection site. Pt states that she [place] Apidra [place] and was told to take to cover all her insulin needs.  This obviously caused hypoglycemia several times per day. (Apidra has not been researched or tried in pregnancy). I would like to try the gestational age/wt based protocol tonight with NPH and Novolog and RN's are to observe pt closely for local rash or other reaction. Pt has not had her morning NPH, so her glucose may run high this afternoon and HS.  True test will start tomorrow am. Have ordered one correction scale for fasting and 2 hrs post-prandial in order to simplify for all.  Pt is agreeable to try this regimen as explained. I am on call all weekend and am glad to review glucose results and recommend insulin adjustments/changes. Called Dr. Penne Lash who gave verbal orders to discontinue the previous orders and enter the orders per the diabetic pregnant patient order set.  At discharge, please order pt a glucose meter approved by her medicaid.Marland Kitchen  Her medicaid is active and will cover a one touch meter (which is preferable to the meter she is using. She can take the prescription to the pharmacy and pharmacist can give her choices and preferences.  Thank you, Lenor Coffin, RN, CNS, Diabetes Coordinator [phone]) [phone])

## 2012-11-06 LAB — GLUCOSE, CAPILLARY
Glucose-Capillary: 164 mg/dL — ABNORMAL HIGH (ref 70–99)
Glucose-Capillary: 81 mg/dL (ref 70–99)
Glucose-Capillary: 113 mg/dL — ABNORMAL HIGH (ref 70–99)

## 2012-11-06 MED ORDER — ACETAMINOPHEN-CODEINE #3 300-30 MG PO TABS: 1.0000 | ORAL_TABLET | ORAL | Status: DC | PRN

## 2012-11-06 MED ORDER — ACCU-CHEK NANO SMARTVIEW W/DEVICE KIT: 1.0000 | PACK | Status: DC

## 2012-11-06 MED ORDER — INSULIN ASPART 100 UNIT/ML ~~LOC~~ SOLN: 3.0000 [IU] | Freq: Four times a day (QID) | SUBCUTANEOUS | Status: DC

## 2012-11-06 MED ORDER — INSULIN ASPART 100 UNIT/ML ~~LOC~~ SOLN: 4.0000 [IU] | Freq: Three times a day (TID) | SUBCUTANEOUS | Status: DC

## 2012-11-06 MED ORDER — ONDANSETRON 4 MG PO TBDP: 4.0000 mg | ORAL_TABLET | Freq: Four times a day (QID) | ORAL | Status: DC | PRN

## 2012-11-06 MED ORDER — GLUCOSE BLOOD VI STRP: ORAL_STRIP | Status: DC

## 2012-11-06 MED ORDER — INSULIN NPH (HUMAN) (ISOPHANE) 100 UNIT/ML ~~LOC~~ SUSP: 13.0000 [IU] | Freq: Two times a day (BID) | SUBCUTANEOUS | Status: DC

## 2012-11-06 NOTE — Discharge Summary (Signed)
Physician Discharge Summary  Patient ID: Kristy Mcmillan MRN: [id number] DOB/AGE: 1975-09-06 37 y.o.  Admit date: 11/04/2012 Discharge date: 11/06/2012  Admission Diagnoses: Class C DM, pregnancy, for diabetic control  Discharge Diagnoses: same Active Problems:   * No active hospital problems. *   Discharged Condition: good  Hospital Course: She was admitted for diabetic control. Her insulin management was changed from Apidra and metformin to NPH and Novalog. Her HBA1C was 10, and now is 8. Her sugars were initially greater than 200 and by the day of discharge were 70-160. She and her husband state that they are comfortable with the insulin changes. They did request a receipt for another glucometer as her is not accurate. She requested another prescription for Tylenol #3 for the treatment of her headaches. She already has an appt for an anatomy scan on 11-12-12.  Consults: Lenor Coffin  Significant Diagnostic Studies: labs: as above  Treatments: insulin: Humalog and NPH  Discharge Exam: Blood pressure 102/68, pulse 95, temperature 98 F (36.7 C), temperature source Oral, resp. rate 16, height 5\' 3"  (1.6 m), weight 91.797 kg (202 lb 6 oz), last menstrual period 06/25/2012, SpO2 97.00%. General appearance: alert Resp: clear to auscultation bilaterally Cardio: regular rate and rhythm, S1, S2 normal, no murmur, click, rub or gallop GI: soft, non-tender; bowel sounds normal; no masses,  no organomegaly  Disposition: 01-Home or Self Care   Future Appointments Provider Department Dept Phone   11/12/2012 2:15 PM [facility] [place] [facility] [phone]   11/12/2012 3:00 PM [facility]ing Rm [facility] [phone]   11/12/2012 4:00 PM [facility] Lab [facility] [phone]   12/03/2012 10:45 AM Catalina Antigua, MD [facility] [phone]       Medication List    STOP taking these  medications       insulin glulisine 100 UNIT/ML injection  Commonly known as:  APIDRA      TAKE these medications       ACCU-CHEK NANO SMARTVIEW W/DEVICE Kit  1 Device by Does not apply route as directed. Check fasting, and two hours after breakfast, lunch and dinner     acetaminophen-codeine 300-30 MG per tablet  Commonly known as:  TYLENOL #3  Take 1-2 tablets by mouth every 6 (six) hours as needed for pain.     acetaminophen-codeine 300-30 MG per tablet  Commonly known as:  TYLENOL #3  Take 1 tablet by mouth every 4 (four) hours as needed for pain.     glucose blood test strip  Commonly known as:  ACCU-CHEK SMARTVIEW  Use as instructed to check blood sugars     insulin aspart 100 UNIT/ML injection  Commonly known as:  novoLOG  Inject 3-21 Units into the skin 4 (four) times daily.     insulin aspart 100 UNIT/ML injection  Commonly known as:  novoLOG  Inject 4-8 Units into the skin 3 (three) times daily with meals.     insulin NPH 100 UNIT/ML injection  Commonly known as:  HUMULIN N,NOVOLIN N  Inject 13 Units into the skin 2 (two) times daily.     naproxen sodium 220 MG tablet  Commonly known as:  ANAPROX  Take 880 mg by mouth 2 (two) times daily as needed (for pain).     ondansetron 4 MG disintegrating tablet  Commonly known as:  ZOFRAN ODT  Take 1-2 tablets (4-8 mg total) by mouth every 8 (eight) hours  as needed for nausea.     ondansetron 4 MG disintegrating tablet  Commonly known as:  ZOFRAN ODT  Take 1 tablet (4 mg total) by mouth every 6 (six) hours as needed for nausea.     prenatal multivitamin Tabs  Take 1 tablet by mouth at bedtime.           Follow-up Information   Follow up with [facility]. Schedule an appointment as soon as possible for a visit in 1 week.   Contact information:   [address] [place] [address] [phone]      Signed: Allie Bossier. 11/06/2012, 3:02 PM

## 2012-11-06 NOTE — Progress Notes (Signed)
Discharge instructions given to patient and significant other at bedside.  Diet teaching and handouts provided.  Teaching in regards to insulin administration provided.  Patient able to teach back information provided.  No questions at this time.  Patient left unit in stable condition with all personal belongings and tylenol 3 prescription.  Osvaldo Angst, RN-------

## 2012-11-09 ENCOUNTER — Telehealth: Payer: Self-pay | Admitting: Obstetrics & Gynecology

## 2012-11-09 ENCOUNTER — Telehealth: Payer: Self-pay | Admitting: *Deleted

## 2012-11-09 ENCOUNTER — Other Ambulatory Visit: Payer: Self-pay | Admitting: Obstetrics & Gynecology

## 2012-11-09 DIAGNOSIS — O24019 Pre-existing diabetes mellitus, type 1, in pregnancy, unspecified trimester: Secondary | ICD-10-CM

## 2012-11-09 DIAGNOSIS — O09529 Supervision of elderly multigravida, unspecified trimester: Secondary | ICD-10-CM

## 2012-11-09 MED ORDER — GLUCOSE BLOOD VI STRP: ORAL_STRIP | Status: DC

## 2012-11-09 NOTE — Telephone Encounter (Signed)
Patient called to ask about an appointment. Appointment was given for 11/11/2012 @ 9:30. Patient stated she was suppose to be going to [facility] per Dr. Macon Large. I asked her what she wanted to do because Dr. Marice Potter said you were to f/u here. She stated Dr. Marice Potter must know what she's talking about. I said okay. So you have an appointment on Thursday at 9:30. She said that is not a week and Dr. Marice Potter said a week. I told her Dr. Marice Potter wanted her to be seen ASAP, so this is the next appointment. She hung up.

## 2012-11-09 NOTE — Telephone Encounter (Addendum)
Message received by Kristy Mcmillan stating that Kristy Mcmillan needs a Rx for different diabetes test strips because she got a new meter and her old ones do not fit. I called pt's pharmacy to verify the type of meter that she received. I was told that she received the Accu-chek Nano and that Medicare requires a paper Rx. I called pt and left message that she can pick up the Rx today by 1530 or tomorrow from 0730-1700.

## 2012-11-10 ENCOUNTER — Telehealth: Payer: Self-pay | Admitting: *Deleted

## 2012-11-10 ENCOUNTER — Other Ambulatory Visit: Payer: Self-pay | Admitting: Family Medicine

## 2012-11-10 MED ORDER — GLUCOSE BLOOD VI STRP: ORAL_STRIP | Status: DC

## 2012-11-10 MED ORDER — ZOLPIDEM TARTRATE 10 MG PO TABS: 10.0000 mg | ORAL_TABLET | Freq: Every evening | ORAL | Status: DC | PRN

## 2012-11-10 NOTE — Telephone Encounter (Signed)
Called pt to be sure that she had received my message from yesterday regarding need to pick up paper Rx here at clinic. Pt asked if she could get the Rx from the [facility] office so that she would not have to drive so far. She also asked for Rx for Ambien. I stated that I would talk with Dr. Shawnie Pons since she is at the [facility] office today. Kristy Mcmillan may pick up her Rx today before 3pm.  Pt voiced understanding.

## 2012-11-11 ENCOUNTER — Encounter: Payer: Medicare Other | Admitting: Obstetrics & Gynecology

## 2012-11-12 ENCOUNTER — Ambulatory Visit (HOSPITAL_COMMUNITY): Payer: Medicare Other

## 2012-11-12 ENCOUNTER — Encounter: Payer: Self-pay | Admitting: Obstetrics & Gynecology

## 2012-11-12 LAB — HARMONY WITH XY ANALYSIS: NUMBER OF FETUSES: 1

## 2012-11-18 ENCOUNTER — Encounter: Payer: Self-pay | Admitting: *Deleted

## 2012-11-19 ENCOUNTER — Ambulatory Visit (HOSPITAL_COMMUNITY): Admission: RE | Admit: 2012-11-19 | Payer: Medicare Other | Source: Ambulatory Visit

## 2012-11-19 ENCOUNTER — Ambulatory Visit (HOSPITAL_COMMUNITY)
Admission: RE | Admit: 2012-11-19 | Discharge: 2012-11-19 | Disposition: A | Discharge status: Home / Self Care | Payer: Medicare Other | Source: Ambulatory Visit | Attending: Obstetrics & Gynecology | Admitting: Obstetrics & Gynecology

## 2012-11-19 ENCOUNTER — Encounter (HOSPITAL_COMMUNITY): Payer: Self-pay

## 2012-11-19 VITALS — BP 117/72 | HR 95 | Wt 199.8 lb

## 2012-11-19 DIAGNOSIS — O10912 Unspecified pre-existing hypertension complicating pregnancy, second trimester: Secondary | ICD-10-CM

## 2012-11-19 DIAGNOSIS — Z363 Encounter for antenatal screening for malformations: Secondary | ICD-10-CM | POA: Insufficient documentation

## 2012-11-19 DIAGNOSIS — O24919 Unspecified diabetes mellitus in pregnancy, unspecified trimester: Secondary | ICD-10-CM | POA: Insufficient documentation

## 2012-11-19 DIAGNOSIS — O34219 Maternal care for unspecified type scar from previous cesarean delivery: Secondary | ICD-10-CM | POA: Insufficient documentation

## 2012-11-19 DIAGNOSIS — O09522 Supervision of elderly multigravida, second trimester: Secondary | ICD-10-CM

## 2012-11-19 DIAGNOSIS — O99350 Diseases of the nervous system complicating pregnancy, unspecified trimester: Secondary | ICD-10-CM | POA: Insufficient documentation

## 2012-11-19 DIAGNOSIS — O10019 Pre-existing essential hypertension complicating pregnancy, unspecified trimester: Secondary | ICD-10-CM | POA: Insufficient documentation

## 2012-11-19 DIAGNOSIS — O358XX0 Maternal care for other (suspected) fetal abnormality and damage, not applicable or unspecified: Secondary | ICD-10-CM | POA: Insufficient documentation

## 2012-11-19 DIAGNOSIS — O09529 Supervision of elderly multigravida, unspecified trimester: Secondary | ICD-10-CM

## 2012-11-19 DIAGNOSIS — G40909 Epilepsy, unspecified, not intractable, without status epilepticus: Secondary | ICD-10-CM | POA: Insufficient documentation

## 2012-11-19 DIAGNOSIS — Z1389 Encounter for screening for other disorder: Secondary | ICD-10-CM | POA: Insufficient documentation

## 2012-11-19 DIAGNOSIS — O24019 Pre-existing diabetes mellitus, type 1, in pregnancy, unspecified trimester: Secondary | ICD-10-CM

## 2012-11-19 NOTE — Progress Notes (Signed)
[facility]  Indication: 37 yr old G2P1001 at [redacted]w[redacted]d with chronic hypertension, type II diabetes, seizure disorder, and obesity for fetal anatomic survey.  Findings:  1. Single intrauterine pregnancy. 2. Fetal biometry is consistent with dating. 3. Posterior placenta without evidence of previa. 4. Normal amniotic fluid volume. 5. Normal transabdominal cervical length. 6. The views of the posterior fossa, palate, orbits, heart, spine, ankles, diaphragm, and hands are limited. 7. The remainder of the limited anatomy survey is normal.  Recommendations: 1. Appropriate fetal growth. 2. Limited anatomy survey: - recommend follow up in 3 weeks to complete fetal anatomic survey 3. Chronic hypertension: - no consult requested - not on medication - recommend start anti-hypertensives if BPs >150/100 - normal baseline labs; needs baseline 24 hour urine or protein/creatinine ratio - recommend fetal growth every 4 weeks - recommend antenatal testing starting at [redacted] weeks gestation - recommend close surveillance for the development of signs/symptoms of preeclampsia - recommend delivery by estimated due date but not prior to 38 weeks in the absence of other complications 4. Diabetes: - no consult requested - on insulin - recommend strict glucose control - fetal growth, antenatal testing, and delivery recommendations as above - HgbA1C was 10%; recommend fetal echocardiogram at 20-[redacted] weeks gestation- scheduled 5. Seizure disorder: - no consult requested - not on medication 6. Advanced maternal age: - patient met with the genetic counselor; see separate report - had normal [place] screen  Eulis Foster, MD

## 2012-11-23 NOTE — Progress Notes (Signed)
Genetic Counseling  High-Risk Gestation Note  Appointment Date:  11/19/2012 Referred By: Tereso Newcomer, MD Date of Birth:  04-21-1976    Pregnancy History: [id number] Estimated Date of Delivery: 04/16/13 Estimated Gestational Age: [redacted]w[redacted]d Attending: Eulis Foster, MD    [name] was seen for genetic counseling regarding a maternal age of 37 y.o..  She was counseled regarding maternal age and the association with risk for chromosome conditions due to nondisjunction with aging of the ova.   We reviewed chromosomes, nondisjunction, and the associated 1 in 26 risk for fetal aneuploidy related to a maternal age of 37 y.o. at [redacted]w[redacted]d weeks gestation.  She was counseled that the risk for aneuploidy decreases as gestational age increases, accounting for those pregnancies which spontaneously abort.  We specifically discussed Down syndrome (trisomy 30), trisomies 47 and 40, and sex chromosome aneuploidies (47,XXX and 47,XXY) including the common features and prognoses of each.   We also reviewed [name] cell free fetal DNA testing/noninvasive prenatal screening (NIPS) result ([facility]) that was performed through her primary OB. We discussed that NIPS analyzes cell free fetal DNA found in the maternal circulation. This test is not diagnostic for chromosome conditions, but can provide information regarding the presence or absence of extra fetal DNA for chromosomes 13, 18, and 21, as well as additional or missing material from chromosomes X and Y. Thus, it would not identify or rule out all fetal aneuploidy. The reported detection rate is greater than 99% for Trisomy 21, greater than 98% for Trisomy 18, and is approximately 80% (8 out of 10) for Trisomy 13. The false positive rate is reported to be less than 0.1% for any of these conditions. We reviewed that Kristy Mcmillan's results are within normal limits, showing a less than 1 in 10,000 risk for trisomies 21, 18 and 13. Testing was also performed  for X and Y chromosome analysis. This did not show evidence of aneuploidy for X or Y. It was also consistent with female gender. Y analysis has a detection rate of approximately 99%.   We reviewed the approximate 1 in 300-500 risk for complications, including spontaneous pregnancy loss. We reviewed the available screening option of detailed ultrasound.   She was also counseled regarding diagnostic testing via amniocentesis. We reviewed the approximate 1 in 300-500 risk for complications for amniocentesis, including spontaneous pregnancy loss.  Kristy Mcmillan declined amniocentesis and elected to proceed with detailed ultrasound only.  A complete ultrasound was performed today. The ultrasound report will be sent under separate cover. There were no visualized fetal anomalies or markers suggestive of aneuploidy. We discussed that screening tests cannot rule out all birth defects or genetic syndromes. The patient was advised of this limitation and states she still does not want additional testing at this time.   [name] was provided with written information regarding cystic fibrosis (CF) including the carrier frequency and incidence in the Caucasian population, the availability of carrier testing and prenatal diagnosis if indicated.  In addition, we discussed that CF is routinely screened for as part of the [place] newborn screening panel.  She declined testing today.   Both family histories were reviewed and found to be contributory for the couple's son born with an extra ureter on his kidney, requiring surgical correction. He is currently 37 years old and otherwise healthy. The patient reported that she was taking Depakote during that pregnancy with her son. We discussed that use of Depakote (valproic acid) is associated with an increased  risk for open neural tube defects. Its use in pregnancy has been reported, less commonly with additional birth defects. We reviewed that if this was the underlying  etiology for her son's additional ureter, then recurrence risk would not likely be increased for the current pregnancy, given that the patient does not report taking valproic acid during this pregnancy. However, in the case of a different underlying cause or in the case of multifactorial inheritance, recurrence risk may be increased. We reviewed that detailed ultrasound is available in pregnancy to assess fetal growth and anatomy. The patient understands that ultrasound cannot diagnose or rule out all birth defect prenatally. Without further information regarding the provided family history, an accurate genetic risk cannot be calculated. Further genetic counseling is warranted if more information is obtained.  Ms. [name] denied exposure to environmental toxins or chemical agents. She denied the use of alcohol, tobacco or street drugs. She denied significant viral illnesses during the course of her pregnancy. Her medical and surgical histories were contributory for diabetes, for which she is currently treated with Novolog. [name] has previously met with diabetes education regarding diabetic management in pregnancy. [name] reported a history of seizures but reported that her last seizure occurred in 2005. She reported that she has not required medication for treatment of seizures since 2005.    I counseled [name] regarding the above risks and available options.  The approximate face-to-face time with the genetic counselor was 25 minutes.  Quinn Plowman, MS Certified Genetic Counselor 11/23/2012

## 2012-11-30 ENCOUNTER — Encounter: Payer: Medicare Other | Admitting: Family Medicine

## 2012-12-01 ENCOUNTER — Ambulatory Visit (INDEPENDENT_AMBULATORY_CARE_PROVIDER_SITE_OTHER): Payer: Medicare Other | Admitting: Family Medicine

## 2012-12-01 ENCOUNTER — Encounter: Payer: Self-pay | Admitting: Family Medicine

## 2012-12-01 VITALS — BP 150/87 | Wt 197.0 lb

## 2012-12-01 DIAGNOSIS — O10019 Pre-existing essential hypertension complicating pregnancy, unspecified trimester: Secondary | ICD-10-CM

## 2012-12-01 DIAGNOSIS — Z3480 Encounter for supervision of other normal pregnancy, unspecified trimester: Secondary | ICD-10-CM

## 2012-12-01 DIAGNOSIS — O34219 Maternal care for unspecified type scar from previous cesarean delivery: Secondary | ICD-10-CM

## 2012-12-01 DIAGNOSIS — R519 Headache, unspecified: Secondary | ICD-10-CM

## 2012-12-01 DIAGNOSIS — O24312 Unspecified pre-existing diabetes mellitus in pregnancy, second trimester: Secondary | ICD-10-CM

## 2012-12-01 DIAGNOSIS — K219 Gastro-esophageal reflux disease without esophagitis: Secondary | ICD-10-CM

## 2012-12-01 DIAGNOSIS — O9989 Other specified diseases and conditions complicating pregnancy, childbirth and the puerperium: Secondary | ICD-10-CM

## 2012-12-01 DIAGNOSIS — S30861A Insect bite (nonvenomous) of abdominal wall, initial encounter: Secondary | ICD-10-CM

## 2012-12-01 DIAGNOSIS — W57XXXA Bitten or stung by nonvenomous insect and other nonvenomous arthropods, initial encounter: Secondary | ICD-10-CM | POA: Insufficient documentation

## 2012-12-01 DIAGNOSIS — O24919 Unspecified diabetes mellitus in pregnancy, unspecified trimester: Secondary | ICD-10-CM

## 2012-12-01 DIAGNOSIS — O10912 Unspecified pre-existing hypertension complicating pregnancy, second trimester: Secondary | ICD-10-CM

## 2012-12-01 DIAGNOSIS — O26892 Other specified pregnancy related conditions, second trimester: Secondary | ICD-10-CM

## 2012-12-01 MED ORDER — SULFAMETHOXAZOLE-TRIMETHOPRIM 800-160 MG PO TABS: 1.0000 | ORAL_TABLET | Freq: Two times a day (BID) | ORAL | Status: DC

## 2012-12-01 MED ORDER — LABETALOL HCL 200 MG PO TABS: 200.0000 mg | ORAL_TABLET | Freq: Two times a day (BID) | ORAL | Status: DC

## 2012-12-01 MED ORDER — ACETAMINOPHEN-CODEINE #3 300-30 MG PO TABS: 1.0000 | ORAL_TABLET | ORAL | Status: DC | PRN

## 2012-12-01 MED ORDER — OMEPRAZOLE MAGNESIUM 20 MG PO TBEC: 20.0000 mg | DELAYED_RELEASE_TABLET | Freq: Every day | ORAL | Status: DC

## 2012-12-01 NOTE — Progress Notes (Signed)
P - 100 - Pt thinks she was bit by a tic - she also states she needs meds for acid reflux

## 2012-12-01 NOTE — Progress Notes (Signed)
BP remains elevated--start labetalol Requests Tylenol #3 refill-given # 10 Requests rx for GERD-Prilosec Tic bite with superficial infection--bactrim ds x 10 days No BS--unclear if taking insulin correctly--lengthy amount of time taken to work out diet, and insulin regimen Advised risk of stillbirth Fetal ECHO is scheduled No 24 hr urine done as yet--she will bring it back on return Schedule optho

## 2012-12-01 NOTE — Patient Instructions (Addendum)
Vaginal Birth After Cesarean Delivery Vaginal birth after Cesarean delivery (VBAC) is giving birth vaginally after previously delivering a baby by a cesarean. In the past, if a woman had a Cesarean delivery, all births afterwards would be done by Cesarean delivery. This is no longer true. It can be safe for the mother to try a vaginal delivery after having a Cesarean. The final decision to have a VBAC or repeat Cesarean delivery should be between the patient and her caregiver. The risks and benefits can be discussed relative to the reason for, and the type of the previous Cesarean delivery. WOMEN WHO PLAN TO HAVE A VBAC SHOULD CHECK WITH THEIR DOCTOR TO BE SURE THAT:  The previous Cesarean was done with a low transverse uterine incision (not a vertical classical incision).  The birth canal is big enough for the baby.  There were no other operations on the uterus.  They will have an electronic fetal monitor (EFM) on at all times during labor.  An operating room would be available and ready in case an emergency Cesarean is needed.  A doctor and surgical nursing staff would be available at all times during labor to be ready to do an emergency Cesarean if necessary.  An anesthesiologist would be present in case an emergency Cesarean is needed.  The nursery is prepared and has adequate personnel and necessary equipment available to care for the baby in case of an emergency Cesarean. BENEFITS OF VBAC:  Shorter stay in the hospital.  Lower delivery, nursery and hospital costs.  Less blood loss and need for blood transfusions.  Less fever and discomfort from major surgery.  Lower risk of blood clots.  Lower risk of infection.  Shorter recovery after going home.  Lower risk of other surgical complications, such as opening of the incision or hernia in the incision.  Decreased risk of injury to other organs.  Decreased risk for having to remove the uterus (hysterectomy).  Decreased risk  for the placenta to completely or partially cover the opening of the uterus (placenta previa) with a future pregnancy.  Ability to have a larger family if desired. RISKS OF A VBAC:  Rupture of the uterus.  Having to remove the uterus (hysterectomy) if it ruptures.  All the complications of major surgery and/or injury to other organs.  Excessive bleeding, blood clots and infection.  Lower Apgar scores (method to evaluate the newborn based on appearance, pulse, grimace, activity, and respiration) and more risks to the baby.  There is a higher risk of uterine rupture if you induce or augment labor.  There is a higher risk of uterine rupture if you use medications to ripen the cervix. VBAC SHOULD NOT BE DONE IF:  The previous Cesarean was done with a vertical (classical) or T-shaped incision, or you do not know what kind of an incision was made.  You had a ruptured uterus.  You had surgery on your uterus.  You have medical or obstetrical problems.  There are problems with the baby.  There were two previous Cesarean deliveries and no vaginal deliveries. OTHER FACTS TO KNOW ABOUT VBAC:  It is safe to have an epidural anesthetic with VBAC.  It is safe to turn the baby from a breech position (attempt an external cephalic version).  It is safe to try a VBAC with twins.  Pregnancies later than 40 weeks have not been successful with VBAC.  There is an increased failure rate of a VBAC in obese pregnant women.  There is  an increased failure rate with VABC if the baby weighs 8.8 pounds (4000 grams) or more.  There is an increased failure rate if the time between the Cesarean and VBAC is less than 19 months.  There is an increased failure rate if pre-eclampsia is present (high blood pressure, protein in the urine and swelling of face and extremities).  VBAC is very successful if there was a previous vaginal birth.  VBAC is very successful when the labor starts spontaneously before  the due date.  Delivery of VBAC is similar to having a normal spontaneous vaginal delivery. It is important to discuss VBAC with your caregiver early in the pregnancy so you can understand the risks, benefits and options. It will give you time to decide what is best in your particular case relevant to the reason for your previous Cesarean delivery. It should be understood that medical changes in the mother or pregnancy may occur during the pregnancy, which make it necessary to change you or your caregiver's initial decision. The counseling, concerns and decisions should be documented in the medical record and signed by all parties. Document Released: 12/07/2006 Document Revised: 09/08/2011 Document Reviewed: 07/28/2008 [facility] Patient Information 2014 [place], [place].  Gestational Diabetes Mellitus Gestational diabetes mellitus, often simply referred to as gestational diabetes, is a type of diabetes that some women develop during pregnancy. In gestational diabetes, the pancreas does not make enough insulin (a hormone), the cells are less responsive to the insulin that is made (insulin resistance), or both.Normally, insulin moves sugars from food into the tissue cells. The tissue cells use the sugars for energy. The lack of insulin or the lack of normal response to insulin causes excess sugars to build up in the blood instead of going into the tissue cells. As a result, high blood sugar (hyperglycemia) develops. The effect of high sugar (glucose) levels can cause many complications.  RISK FACTORS You have an increased chance of developing gestational diabetes if you have a family history of diabetes and also have one or more of the following risk factors:  A body mass index over 30 (obesity).  A previous pregnancy with gestational diabetes.  An older age at the time of pregnancy. If blood glucose levels are kept in the normal range during pregnancy, women can have a healthy pregnancy. If your blood  glucose levels are not well controlled, there may be risks to you, your unborn baby (fetus), your labor and delivery, or your newborn baby.  SYMPTOMS  If symptoms are experienced, they are much like symptoms you would normally expect during pregnancy. The symptoms of gestational diabetes include:   Increased thirst (polydipsia).  Increased urination (polyuria).  Increased urination during the night (nocturia).  Weight loss. This weight loss may be rapid.  Frequent, recurring infections.  Tiredness (fatigue).  Weakness.  Vision changes, such as blurred vision.  Fruity smell to your breath.  Abdominal pain. DIAGNOSIS Diabetes is diagnosed when blood glucose levels are increased. Your blood glucose level may be checked by one or more of the following blood tests:  A fasting blood glucose test. You will not be allowed to eat for at least 8 hours before a blood sample is taken.  A random blood glucose test. Your blood glucose is checked at any time of the day regardless of when you ate.  A hemoglobin A1c blood glucose test. A hemoglobin A1c test provides information about blood glucose control over the previous 3 months.  An oral glucose tolerance test (OGTT). Your blood  glucose is measured after you have not eaten (fasted) for 1 3 hours and then after you drink a glucose-containing beverage. Since the hormones that cause insulin resistance are highest at about 24 28 weeks of a pregnancy, an OGTT is usually performed during that time. If you have risk factors for gestational diabetes, your caregiver may test you for gestational diabetes earlier than 24 weeks of pregnancy. TREATMENT   You will need to take diabetes medicine or insulin daily to keep blood glucose levels in the desired range.  You will need to match insulin dosing with exercise and healthy food choices. The treatment goal is to maintain the before meal (preprandial), bedtime, and overnight blood glucose level at 60 99  mg/dL during pregnancy. The treatment goal is to further maintain peak after meal blood sugar (postprandial glucose) level at 100 140 mg/dL.  HOME CARE INSTRUCTIONS   Have your hemoglobin A1c level checked twice a year.  Perform daily blood glucose monitoring as directed by your caregiver. It is common to perform frequent blood glucose monitoring.  Monitor urine ketones when you are ill and as directed by your caregiver.  Take your diabetes medicine and insulin as directed by your caregiver to maintain your blood glucose level in the desired range.  Never run out of diabetes medicine or insulin. It is needed every day.  Adjust insulin based on your intake of carbohydrates. Carbohydrates can raise blood glucose levels but need to be included in your diet. Carbohydrates provide vitamins, minerals, and fiber which are an essential part of a healthy diet. Carbohydrates are found in fruits, vegetables, whole grains, dairy products, legumes, and foods containing added sugars.    Eat healthy foods. Alternate 3 meals with 3 snacks.  Maintain a healthy weight gain. The usual total expected weight gain varies according to your prepregnancy body mass index (BMI).  Carry a medical alert card or wear your medical alert jewelry.  Carry a 15 gram carbohydrate snack with you at all times to treat low blood glucose (hypoglycemia). Some examples of 15 gram carbohydrate snacks include:  Glucose tablets, 3 or 4   Glucose gel, 15 gram tube  Raisins, 2 tablespoons (24 g)  Jelly beans, 6  Animal crackers, 8  Fruit juice, regular soda, or low fat milk, 4 ounces (120 mL)  Gummy treats, 9    Recognize hypoglycemia. Hypoglycemia during pregnancy occurs with blood glucose levels of 60 mg/dL and below. The risk for hypoglycemia increases when fasting or skipping meals, during or after intense exercise, and during sleep. Hypoglycemia symptoms can include:  Tremors or shakes.  Decreased ability to  concentrate.  Sweating.  Increased heart rate.  Headache.  Dry mouth.  Hunger.  Irritability.  Anxiety.  Restless sleep.  Altered speech or coordination.  Confusion.  Treat hypoglycemia promptly. If you are alert and able to safely swallow, follow the 15:15 rule:  Take 15 20 grams of rapid-acting glucose or carbohydrate. Rapid-acting options include glucose gel, glucose tablets, or 4 ounces (120 mL) of fruit juice, regular soda, or low fat milk.  Check your blood glucose level 15 minutes after taking the glucose.   Take 15 20 grams more of glucose if the repeat blood glucose level is still 70 mg/dL or below.  Eat a meal or snack within 1 hour once blood glucose levels return to normal.  Be alert to polyuria and polydipsia which are early signs of hyperglycemia. An early awareness of hyperglycemia allows for prompt treatment. Treat hyperglycemia as directed  by your caregiver.  Engage in at least 30 minutes of physical activity a day or as directed by your caregiver. Ten minutes of physical activity timed 30 minutes after each meal is encouraged to control postprandial blood glucose levels.  Adjust your insulin dosing and food intake as needed if you start a new exercise or sport.  Follow your sick day plan at any time you are unable to eat or drink as usual.  Avoid tobacco and alcohol use.  Follow up with your caregiver regularly.  Follow the advice of your caregiver regarding your prenatal and post-delivery (postpartum) appointments, meal planning, exercise, medicines, vitamins, blood tests, other medical tests, and physical activities.  Perform daily skin and foot care. Examine your skin and feet daily for cuts, bruises, redness, nail problems, bleeding, blisters, or sores.  Brush your teeth and gums at least twice a day and floss at least once a day. Follow up with your dentist regularly.  Schedule an eye exam during the first trimester of your pregnancy or as  directed by your caregiver.  Share your diabetes management plan with your workplace or school.  Stay up-to-date with immunizations.  Learn to manage stress.  Obtain ongoing diabetes education and support as needed. SEEK MEDICAL CARE IF:   You are unable to eat food or drink fluids for more than 6 hours.  You have nausea and vomiting for more than 6 hours.  You have a blood glucose level of 200 mg/dL and you have ketones in your urine.  There is a change in mental status.  You develop vision problems.  You have a persistent headache.  You have upper abdominal pain or discomfort.  You develop an additional serious illness.  You have diarrhea for more than 6 hours.  You have been sick or have had a fever for a couple of days and are not getting better. SEEK IMMEDIATE MEDICAL CARE IF:   You have difficulty breathing.  You no longer feel the baby moving.  You are bleeding or have discharge from your vagina.  You start having premature contractions or labor. MAKE SURE YOU:  Understand these instructions.  Will watch your condition.  Will get help right away if you are not doing well or get worse. Document Released: 09/22/2000 Document Revised: 03/10/2012 Document Reviewed: 01/13/2012 [facility] Patient Information 2014 [place], [place].  Pregnancy - Second Trimester The second trimester of pregnancy (3 to 6 months) is a period of rapid growth for you and your baby. At the end of the sixth month, your baby is about 9 inches long and weighs 1 1/2 pounds. You will begin to feel the baby move between 18 and 20 weeks of the pregnancy. This is called quickening. Weight gain is faster. A clear fluid (colostrum) may leak out of your breasts. You may feel small contractions of the womb (uterus). This is known as false labor or Braxton-Hicks contractions. This is like a practice for labor when the baby is ready to be born. Usually, the problems with morning sickness have usually  passed by the end of your first trimester. Some women develop small dark blotches (called cholasma, mask of pregnancy) on their face that usually goes away after the baby is born. Exposure to the sun makes the blotches worse. Acne may also develop in some pregnant women and pregnant women who have acne, may find that it goes away. PRENATAL EXAMS  Blood work may continue to be done during prenatal exams. These tests are done to check  on your health and the probable health of your baby. Blood work is used to follow your blood levels (hemoglobin). Anemia (low hemoglobin) is common during pregnancy. Iron and vitamins are given to help prevent this. You will also be checked for diabetes between 24 and 28 weeks of the pregnancy. Some of the previous blood tests may be repeated.  The size of the uterus is measured during each visit. This is to make sure that the baby is continuing to grow properly according to the dates of the pregnancy.  Your blood pressure is checked every prenatal visit. This is to make sure you are not getting toxemia.  Your urine is checked to make sure you do not have an infection, diabetes or protein in the urine.  Your weight is checked often to make sure gains are happening at the suggested rate. This is to ensure that both you and your baby are growing normally.  Sometimes, an ultrasound is performed to confirm the proper growth and development of the baby. This is a test which bounces harmless sound waves off the baby so your caregiver can more accurately determine due dates. Sometimes, a test is done on the amniotic fluid surrounding the baby. This test is called an amniocentesis. The amniotic fluid is obtained by sticking a needle into the belly (abdomen). This is done to check the chromosomes in instances where there is a concern about possible genetic problems with the baby. It is also sometimes done near the end of pregnancy if an early delivery is required. In this case, it  is done to help make sure the baby's lungs are mature enough for the baby to live outside of the womb. CHANGES OCCURING IN THE SECOND TRIMESTER OF PREGNANCY Your body goes through many changes during pregnancy. They vary from person to person. Talk to your caregiver about changes you notice that you are concerned about.  During the second trimester, you will likely have an increase in your appetite. It is normal to have cravings for certain foods. This varies from person to person and pregnancy to pregnancy.  Your lower abdomen will begin to bulge.  You may have to urinate more often because the uterus and baby are pressing on your bladder. It is also common to get more bladder infections during pregnancy. You can help this by drinking lots of fluids and emptying your bladder before and after intercourse.  You may begin to get stretch marks on your hips, abdomen, and breasts. These are normal changes in the body during pregnancy. There are no exercises or medicines to take that prevent this change.  You may begin to develop swollen and bulging veins (varicose veins) in your legs. Wearing support hose, elevating your feet for 15 minutes, 3 to 4 times a day and limiting salt in your diet helps lessen the problem.  Heartburn may develop as the uterus grows and pushes up against the stomach. Antacids recommended by your caregiver helps with this problem. Also, eating smaller meals 4 to 5 times a day helps.  Constipation can be treated with a stool softener or adding bulk to your diet. Drinking lots of fluids, and eating vegetables, fruits, and whole grains are helpful.  Exercising is also helpful. If you have been very active up until your pregnancy, most of these activities can be continued during your pregnancy. If you have been less active, it is helpful to start an exercise program such as walking.  Hemorrhoids may develop at the end of the  second trimester. Warm sitz baths and hemorrhoid cream  recommended by your caregiver helps hemorrhoid problems.  Backaches may develop during this time of your pregnancy. Avoid heavy lifting, wear low heal shoes, and practice good posture to help with backache problems.  Some pregnant women develop tingling and numbness of their hand and fingers because of swelling and tightening of ligaments in the wrist (carpel tunnel syndrome). This goes away after the baby is born.  As your breasts enlarge, you may have to get a bigger bra. Get a comfortable, cotton, support bra. Do not get a nursing bra until the last month of the pregnancy if you will be nursing the baby.  You may get a dark line from your belly button to the pubic area called the linea nigra.  You may develop rosy cheeks because of increase blood flow to the face.  You may develop spider looking lines of the face, neck, arms, and chest. These go away after the baby is born. HOME CARE INSTRUCTIONS   It is extremely important to avoid all smoking, herbs, alcohol, and unprescribed drugs during your pregnancy. These chemicals affect the formation and growth of the baby. Avoid these chemicals throughout the pregnancy to ensure the delivery of a healthy infant.  Most of your home care instructions are the same as suggested for the first trimester of your pregnancy. Keep your caregiver's appointments. Follow your caregiver's instructions regarding medicine use, exercise, and diet.  During pregnancy, you are providing food for you and your baby. Continue to eat regular, well-balanced meals. Choose foods such as meat, fish, milk and other low fat dairy products, vegetables, fruits, and whole-grain breads and cereals. Your caregiver will tell you of the ideal weight gain.  A physical sexual relationship may be continued up until near the end of pregnancy if there are no other problems. Problems could include early (premature) leaking of amniotic fluid from the membranes, vaginal bleeding, abdominal  pain, or other medical or pregnancy problems.  Exercise regularly if there are no restrictions. Check with your caregiver if you are unsure of the safety of some of your exercises. The greatest weight gain will occur in the last 2 trimesters of pregnancy. Exercise will help you:  Control your weight.  Get you in shape for labor and delivery.  Lose weight after you have the baby.  Wear a good support or jogging bra for breast tenderness during pregnancy. This may help if worn during sleep. Pads or tissues may be used in the bra if you are leaking colostrum.  Do not use hot tubs, steam rooms or saunas throughout the pregnancy.  Wear your seat belt at all times when driving. This protects you and your baby if you are in an accident.  Avoid raw meat, uncooked cheese, cat litter boxes, and soil used by cats. These carry germs that can cause birth defects in the baby.  The second trimester is also a good time to visit your dentist for your dental health if this has not been done yet. Getting your teeth cleaned is okay. Use a soft toothbrush. Brush gently during pregnancy.  It is easier to leak urine during pregnancy. Tightening up and strengthening the pelvic muscles will help with this problem. Practice stopping your urination while you are going to the bathroom. These are the same muscles you need to strengthen. It is also the muscles you would use as if you were trying to stop from passing gas. You can practice tightening these muscles up  10 times a set and repeating this about 3 times per day. Once you know what muscles to tighten up, do not perform these exercises during urination. It is more likely to contribute to an infection by backing up the urine.  Ask for help if you have financial, counseling, or nutritional needs during pregnancy. Your caregiver will be able to offer counseling for these needs as well as refer you for other special needs.  Your skin may become oily. If so, wash your  face with mild soap, use non-greasy moisturizer and oil or cream based makeup. MEDICINES AND DRUG USE IN PREGNANCY  Take prenatal vitamins as directed. The vitamin should contain 1 milligram of folic acid. Keep all vitamins out of reach of children. Only a couple vitamins or tablets containing iron may be fatal to a baby or young child when ingested.  Avoid use of all medicines, including herbs, over-the-counter medicines, not prescribed or suggested by your caregiver. Only take over-the-counter or prescription medicines for pain, discomfort, or fever as directed by your caregiver. Do not use aspirin.  Let your caregiver also know about herbs you may be using.  Alcohol is related to a number of birth defects. This includes fetal alcohol syndrome. All alcohol, in any form, should be avoided completely. Smoking will cause low birth rate and premature babies.  Street or illegal drugs are very harmful to the baby. They are absolutely forbidden. A baby born to an addicted mother will be addicted at birth. The baby will go through the same withdrawal an adult does. SEEK MEDICAL CARE IF:  You have any concerns or worries during your pregnancy. It is better to call with your questions if you feel they cannot wait, rather than worry about them. SEEK IMMEDIATE MEDICAL CARE IF:   An unexplained oral temperature above 102 F (38.9 C) develops, or as your caregiver suggests.  You have leaking of fluid from the vagina (birth canal). If leaking membranes are suspected, take your temperature and tell your caregiver of this when you call.  There is vaginal spotting, bleeding, or passing clots. Tell your caregiver of the amount and how many pads are used. Light spotting in pregnancy is common, especially following intercourse.  You develop a bad smelling vaginal discharge with a change in the color from clear to white.  You continue to feel sick to your stomach (nauseated) and have no relief from remedies  suggested. You vomit blood or coffee ground-like materials.  You lose more than 2 pounds of weight or gain more than 2 pounds of weight over 1 week, or as suggested by your caregiver.  You notice swelling of your face, hands, feet, or legs.  You get exposed to [place] measles and have never had them.  You are exposed to fifth disease or chickenpox.  You develop belly (abdominal) pain. Round ligament discomfort is a common non-cancerous (benign) cause of abdominal pain in pregnancy. Your caregiver still must evaluate you.  You develop a bad headache that does not go away.  You develop fever, diarrhea, pain with urination, or shortness of breath.  You develop visual problems, blurry, or double vision.  You fall or are in a car accident or any kind of trauma.  There is mental or physical violence at home. Document Released: 06/10/2001 Document Revised: 03/10/2012 Document Reviewed: 12/13/2008 [place] [facility] Patient Information 2014 [place], [place].  Contraception Choices Contraception (birth control) is the use of any methods or devices to prevent pregnancy. Below are some methods to help  avoid pregnancy. HORMONAL METHODS   Contraceptive implant. This is a thin, plastic tube containing progesterone hormone. It does not contain estrogen hormone. Your caregiver inserts the tube in the inner part of the upper arm. The tube can remain in place for up to 3 years. After 3 years, the implant must be removed. The implant prevents the ovaries from releasing an egg (ovulation), thickens the cervical mucus which prevents sperm from entering the uterus, and thins the lining of the inside of the uterus.  Progesterone-only injections. These injections are given every 3 months by your caregiver to prevent pregnancy. This synthetic progesterone hormone stops the ovaries from releasing eggs. It also thickens cervical mucus and changes the uterine lining. This makes it harder for sperm to survive in the  uterus.  Birth control pills. These pills contain estrogen and progesterone hormone. They work by stopping the egg from forming in the ovary (ovulation). Birth control pills are prescribed by a caregiver.Birth control pills can also be used to treat heavy periods.  Minipill. This type of birth control pill contains only the progesterone hormone. They are taken every day of each month and must be prescribed by your caregiver.  Birth control patch. The patch contains hormones similar to those in birth control pills. It must be changed once a week and is prescribed by a caregiver.  Vaginal ring. The ring contains hormones similar to those in birth control pills. It is left in the vagina for 3 weeks, removed for 1 week, and then a new one is put back in place. The patient must be comfortable inserting and removing the ring from the vagina.A caregiver's prescription is necessary.  Emergency contraception. Emergency contraceptives prevent pregnancy after unprotected sexual intercourse. This pill can be taken right after sex or up to 5 days after unprotected sex. It is most effective the sooner you take the pills after having sexual intercourse. Emergency contraceptive pills are available without a prescription. Check with your pharmacist. Do not use emergency contraception as your only form of birth control. BARRIER METHODS   Female condom. This is a thin sheath (latex or rubber) that is worn over the penis during sexual intercourse. It can be used with spermicide to increase effectiveness.  Female condom. This is a soft, loose-fitting sheath that is put into the vagina before sexual intercourse.  Diaphragm. This is a soft, latex, dome-shaped barrier that must be fitted by a caregiver. It is inserted into the vagina, along with a spermicidal jelly. It is inserted before intercourse. The diaphragm should be left in the vagina for 6 to 8 hours after intercourse.  Cervical cap. This is a round, soft, latex  or plastic cup that fits over the cervix and must be fitted by a caregiver. The cap can be left in place for up to 48 hours after intercourse.  Sponge. This is a soft, circular piece of polyurethane foam. The sponge has spermicide in it. It is inserted into the vagina after wetting it and before sexual intercourse.  Spermicides. These are chemicals that kill or block sperm from entering the cervix and uterus. They come in the form of creams, jellies, suppositories, foam, or tablets. They do not require a prescription. They are inserted into the vagina with an applicator before having sexual intercourse. The process must be repeated every time you have sexual intercourse. INTRAUTERINE CONTRACEPTION  Intrauterine device (IUD). This is a T-shaped device that is put in a woman's uterus during a menstrual period to prevent pregnancy. There  are 2 types:  Copper IUD. This type of IUD is wrapped in copper wire and is placed inside the uterus. Copper makes the uterus and fallopian tubes produce a fluid that kills sperm. It can stay in place for 10 years.  Hormone IUD. This type of IUD contains the hormone progestin (synthetic progesterone). The hormone thickens the cervical mucus and prevents sperm from entering the uterus, and it also thins the uterine lining to prevent implantation of a fertilized egg. The hormone can weaken or kill the sperm that get into the uterus. It can stay in place for 5 years. PERMANENT METHODS OF CONTRACEPTION  Female tubal ligation. This is when the woman's fallopian tubes are surgically sealed, tied, or blocked to prevent the egg from traveling to the uterus.  Female sterilization. This is when the female has the tubes that carry sperm tied off (vasectomy).This blocks sperm from entering the vagina during sexual intercourse. After the procedure, the man can still ejaculate fluid (semen). NATURAL PLANNING METHODS  Natural family planning. This is not having sexual intercourse or  using a barrier method (condom, diaphragm, cervical cap) on days the woman could become pregnant.  Calendar method. This is keeping track of the length of each menstrual cycle and identifying when you are fertile.  Ovulation method. This is avoiding sexual intercourse during ovulation.  Symptothermal method. This is avoiding sexual intercourse during ovulation, using a thermometer and ovulation symptoms.  Post-ovulation method. This is timing sexual intercourse after you have ovulated. Regardless of which type or method of contraception you choose, it is important that you use condoms to protect against the transmission of sexually transmitted diseases (STDs). Talk with your caregiver about which form of contraception is most appropriate for you. Document Released: 06/16/2005 Document Revised: 09/08/2011 Document Reviewed: 10/23/2010 [facility] Patient Information 2014 [place], [place].  Breastfeeding A change in hormones during your pregnancy causes growth of your breast tissue and an increase in number and size of milk ducts. The hormone prolactin allows proteins, sugars, and fats from your blood supply to make breast milk in your milk-producing glands. The hormone progesterone prevents breast milk from being released before the birth of your baby. After the birth of your baby, your progesterone level decreases allowing breast milk to be released. Thoughts of your baby, as well as his or her sucking or crying, can stimulate the release of milk from the milk-producing glands. Deciding to breastfeed (nurse) is one of the best choices you can make for you and your baby. The information that follows gives a brief review of the benefits, as well as other important skills to know about breastfeeding. BENEFITS OF BREASTFEEDING For your baby  The first milk (colostrum) helps your baby's digestive system function better.   There are antibodies in your milk that help your baby fight off infections.    Your baby has a lower incidence of asthma, allergies, and sudden infant death syndrome (SIDS).   The nutrients in breast milk are better for your baby than infant formulas.  Breast milk improves your baby's brain development.   Your baby will have less gas, colic, and constipation.  Your baby is less likely to develop other conditions, such as childhood obesity, asthma, or diabetes mellitus. For you  Breastfeeding helps develop a very special bond between you and your baby.   Breastfeeding is convenient, always available at the correct temperature, and costs nothing.   Breastfeeding helps to burn calories and helps you lose the weight gained during pregnancy.  Breastfeeding makes your uterus contract back down to normal size faster and slows bleeding following delivery.   Breastfeeding mothers have a lower risk of developing osteoporosis or breast or ovarian cancer later in life.  BREASTFEEDING FREQUENCY  A healthy, full-term baby may breastfeed as often as every hour or space his or her feedings to every 3 hours. Breastfeeding frequency will vary from baby to baby.   Newborns should be fed no less than every 2 3 hours during the day and every 4 5 hours during the night. You should breastfeed a minimum of 8 feedings in a 24 hour period.  Awaken your baby to breastfeed if it has been 3 4 hours since the last feeding.  Breastfeed when you feel the need to reduce the fullness of your breasts or when your newborn shows signs of hunger. Signs that your baby may be hungry include:  Increased alertness or activity.  Stretching.  Movement of the head from side to side.  Movement of the head and opening of the mouth when the corner of the mouth or cheek is stroked (rooting).  Increased sucking sounds, smacking lips, cooing, sighing, or squeaking.  Hand-to-mouth movements.  Increased sucking of fingers or hands.  Fussing.  Intermittent crying.  Signs of extreme  hunger will require calming and consoling before you try to feed your baby. Signs of extreme hunger may include:  Restlessness.  A loud, strong cry.  Screaming.  Frequent feeding will help you make more milk and will help prevent problems, such as sore nipples and engorgement of the breasts.  BREASTFEEDING   Whether lying down or sitting, be sure that the baby's abdomen is facing your abdomen.   Support your breast with 4 fingers under your breast and your thumb above your nipple. Make sure your fingers are well away from your nipple and your baby's mouth.   Stroke your baby's lips gently with your finger or nipple.   When your baby's mouth is open wide enough, place all of your nipple and as much of the colored area around your nipple (areola) as possible into your baby's mouth.  More areola should be visible above his or her upper lip than below his or her lower lip.  Your baby's tongue should be between his or her lower gum and your breast.  Ensure that your baby's mouth is correctly positioned around the nipple (latched). Your baby's lips should create a seal on your breast.  Signs that your baby has effectively latched onto your nipple include:  Tugging or sucking without pain.  Swallowing heard between sucks.  Absent click or smacking sound.  Muscle movement above and in front of his or her ears with sucking.  Your baby must suck about 2 3 minutes in order to get your milk. Allow your baby to feed on each breast as long as he or she wants. Nurse your baby until he or she unlatches or falls asleep at the first breast, then offer the second breast.  Signs that your baby is full and satisfied include:  A gradual decrease in the number of sucks or complete cessation of sucking.  Falling asleep.  Extension or relaxation of his or her body.  Retention of a small amount of milk in his or her mouth.  Letting go of your breast by himself or herself.  Signs of  effective breastfeeding in you include:  Breasts that have increased firmness, weight, and size prior to feeding.  Breasts that are softer after  nursing.  Increased milk volume, as well as a change in milk consistency and color by the 5th day of breastfeeding.  Breast fullness relieved by breastfeeding.  Nipples are not sore, cracked, or bleeding.  If needed, break the suction by putting your finger into the corner of your baby's mouth and sliding your finger between his or her gums. Then, remove your breast from his or her mouth.  It is common for babies to spit up a small amount after a feeding.  Babies often swallow air during feeding. This can make babies fussy. Burping your baby between breasts can help with this.  Vitamin D supplements are recommended for babies who get only breast milk.  Avoid using a pacifier during your baby's first 4 6 weeks.  Avoid supplemental feedings of water, formula, or juice in place of breastfeeding. Breast milk is all the food your baby needs. It is not necessary for your baby to have water or formula. Your breasts will make more milk if supplemental feedings are avoided during the early weeks. HOW TO TELL WHETHER YOUR BABY IS GETTING ENOUGH BREAST MILK Wondering whether or not your baby is getting enough milk is a common concern among mothers. You can be assured that your baby is getting enough milk if:   Your baby is actively sucking and you hear swallowing.   Your baby seems relaxed and satisfied after a feeding.   Your baby nurses at least 8 12 times in a 24 hour time period.  During the first 81 32 days of age:  Your baby is wetting at least 3 5 diapers in a 24 hour period. The urine should be clear and pale yellow.  Your baby is having at least 3 4 stools in a 24 hour period. The stool should be soft and yellow.  At 62 52 days of age, your baby is having at least 3 6 stools in a 24 hour period. The stool should be seedy and yellow by 8  days of age.  Your baby has a weight loss less than 7 10% during the first 65 days of age.  Your baby does not lose weight after 86 65 days of age.  Your baby gains 4 7 ounces each week after he or she is 60 days of age.  Your baby gains weight by 59 days of age and is back to birth weight within 2 weeks. ENGORGEMENT In the first week after your baby is born, you may experience extremely full breasts (engorgement). When engorged, your breasts may feel heavy, warm, or tender to the touch. Engorgement peaks within 24 48 hours after delivery of your baby.  Engorgement may be reduced by:  Continuing to breastfeed.  Increasing the frequency of breastfeeding.  Taking warm showers or applying warm, moist heat to your breasts just before each feeding. This increases circulation and helps the milk flow.   Gently massaging your breast before and during the feedings. With your fingertips, massage from your chest wall towards your nipple in a circular motion.   Ensuring that your baby empties at least one breast at every feeding. It also helps to start the next feeding on the opposite breast.   Expressing breast milk by hand or by using a breast pump to empty the breasts if your baby is sleepy, or not nursing well. You may also want to express milk if you are returning to work oryou feel you are getting engorged.  Ensuring your baby is latched on and positioned  properly while breastfeeding. If you follow these suggestions, your engorgement should improve in 24 48 hours. If you are still experiencing difficulty, call your lactation consultant or caregiver.  CARING FOR YOURSELF Take care of your breasts.  Bathe or shower daily.   Avoid using soap on your nipples.   Wear a supportive bra. Avoid wearing underwire style bras.  Air dry your nipples for a 3 after each feeding.   Use only cotton bra pads to absorb breast milk leakage. Leaking of breast milk between feedings is normal.    Use only pure lanolin on your nipples after nursing. You do not need to wash it off before feeding your baby again. Another option is to express a few drops of breast milk and gently massage that milk into your nipples.  Continue breast self-awareness checks. Take care of yourself.  Eat healthy foods. Alternate 3 meals with 3 snacks.  Avoid foods that you notice affect your baby in a bad way.  Drink milk, fruit juice, and water to satisfy your thirst (about 8 glasses a day).   Rest often, relax, and take your prenatal vitamins to prevent fatigue, stress, and anemia.  Avoid chewing and smoking tobacco.  Avoid alcohol and drug use.  Take over-the-counter and prescribed medicine only as directed by your caregiver or pharmacist. You should always check with your caregiver or pharmacist before taking any new medicine, vitamin, or herbal supplement.  Know that pregnancy is possible while breastfeeding. If desired, talk to your caregiver about family planning and safe birth control methods that may be used while breastfeeding. SEEK MEDICAL CARE IF:   You feel like you want to stop breastfeeding or have become frustrated with breastfeeding.  You have painful breasts or nipples.  Your nipples are cracked or bleeding.  Your breasts are red, tender, or warm.  You have a swollen area on either breast.  You have a fever or chills.  You have nausea or vomiting.  You have drainage from your nipples.  Your breasts do not become full before feedings by the 5th day after delivery.  You feel sad and depressed.  Your baby is too sleepy to eat well.  Your baby is having trouble sleeping.   Your baby is wetting less than 3 diapers in a 24 hour period.  Your baby has less than 3 stools in a 24 hour period.  Your baby's skin or the white part of his or her eyes becomes more yellow.   Your baby is not gaining weight by 33 days of age. MAKE SURE YOU:   Understand these  instructions.  Will watch your condition.  Will get help right away if you are not doing well or get worse. Document Released: 06/16/2005 Document Revised: 03/10/2012 Document Reviewed: 01/21/2012 [facility] Patient Information 2014 [place], [place]. Deer Tick Bite Deer ticks are brown arachnids (spider family) that vary in size from as small as the head of a pin to 1/4 inch (1/2 cm) diameter. They thrive in wooded areas. Deer are the preferred host of adult deer ticks. Small rodents are the host of young ticks (nymphs). When a person walks in a field or wooded area, young and adult ticks in the surrounding grass and vegetation can attach themselves to the skin. They can suck blood for hours to days if unnoticed. Ticks are found all over the [place] Some ticks carry a specific bacteria (Borrelia burgdorferi) that causes an infection called Lyme disease. The bacteria is typically passed into a person  during the blood sucking process. This happens after the tick has been attached for at least a number of hours. While ticks can be found all over the [place] those carrying the bacteria that causes Lyme disease are most common in [place] and the [place]. Only a small proportion of ticks in these areas carry the Lyme disease bacteria and cause human infections. Ticks usually attach to warm spots on the body, such as the:  Head.  Back.  Neck.  Armpits.  Groin. SYMPTOMS  Most of the time, a deer tick bite will not be felt. You may or may not see the attached tick. You may notice mild irritation or redness around the bite site. If the deer tick passes the Lyme disease bacteria to a person, a round, red rash may be noticed 2 to 3 days after the bite. The rash may be clear in the middle, like a bull's-eye or target. If not treated, other symptoms may develop several days to weeks after the onset of the rash. These symptoms may include:  New rash lesions.  Fatigue and weakness.  General ill feeling and  achiness.  Chills.  Headache and neck pain.  Swollen lymph glands.  Sore muscles and joints. 5 to 15% of untreated people with Lyme disease may develop more severe illnesses after several weeks to months. This may include inflammation of the brain lining (meningitis), nerve palsies, an abnormal heartbeat, or severe muscle and joint pain and inflammation (myositis or arthritis). DIAGNOSIS   Physical exam and medical history.  Viewing the tick if it was saved for confirmation.  Blood tests (to check or confirm the presence of Lyme disease). TREATMENT  Most ticks do not carry disease. If found, an attached tick should be removed using tweezers. Tweezers should be placed under the body of the tick so it is removed by its attachment parts (pincers). If there are signs or symptoms of being sick, or Lyme disease is confirmed, medicines (antibiotics) that kill germs are usually prescribed. In more severe cases, antibiotics may be given through an intravenous (IV) access. HOME CARE INSTRUCTIONS   Always remove ticks with tweezers. Do not use petroleum jelly or other methods to kill or remove the tick. Slide the tweezers under the body and pull out as much as you can. If you are not sure what it is, save it in a jar and show your caregiver.  Once you remove the tick, the skin will heal on its own. Wash your hands and the affected area with water and soap. You may place a bandage on the affected area.  Take medicine as directed. You may be advised to take a full course of antibiotics.  Follow up with your caregiver as recommended. FINDING OUT THE RESULTS OF YOUR TEST Not all test results are available during your visit. If your test results are not back during the visit, make an appointment with your caregiver to find out the results. Do not assume everything is normal if you have not heard from your caregiver or the medical facility. It is important for you to follow up on all of your test  results. PROGNOSIS  If Lyme disease is confirmed, early treatment with antibiotics is very effective. Following preventive guidelines is important since it is possible to get the disease more than once. PREVENTION   Wear long sleeves and long pants in wooded or grassy areas. Tuck your pants into your socks.  Use an insect repellent while hiking.  Check yourself, your  children, and your pets regularly for ticks after playing outside.  Clear piles of leaves or brush from your yard. Ticks might live there. SEEK MEDICAL CARE IF:   You or your child has an oral temperature above 102 F (38.9 C).  You develop a severe headache following the bite.  You feel generally ill.  You notice a rash.  You are having trouble removing the tick.  The bite area has red skin or yellow drainage. SEEK IMMEDIATE MEDICAL CARE IF:   Your face is weak and droopy or you have other neurological symptoms.  You have severe joint pain or weakness. MAKE SURE YOU:   Understand these instructions.  Will watch your condition.  Will get help right away if you are not doing well or get worse. FOR MORE INFORMATION Centers for Disease Control and Prevention: FootballExhibition.com.br American [employer]: www.https://powers.com/ Document Released: 09/10/2009 Document Revised: 09/08/2011 Document Reviewed: 09/10/2009 [facility] Patient Information 2014 [place], [place].

## 2012-12-03 ENCOUNTER — Encounter: Payer: Medicare Other | Admitting: Obstetrics and Gynecology

## 2012-12-03 ENCOUNTER — Encounter: Payer: Medicaid Other | Admitting: Obstetrics and Gynecology

## 2012-12-03 ENCOUNTER — Other Ambulatory Visit: Payer: Self-pay | Admitting: Family Medicine

## 2012-12-09 ENCOUNTER — Ambulatory Visit (HOSPITAL_COMMUNITY)
Admission: RE | Admit: 2012-12-09 | Discharge: 2012-12-09 | Disposition: A | Discharge status: Home / Self Care | Payer: Medicare Other | Source: Ambulatory Visit | Attending: Obstetrics & Gynecology | Admitting: Obstetrics & Gynecology

## 2012-12-09 DIAGNOSIS — O10912 Unspecified pre-existing hypertension complicating pregnancy, second trimester: Secondary | ICD-10-CM

## 2012-12-09 DIAGNOSIS — O09522 Supervision of elderly multigravida, second trimester: Secondary | ICD-10-CM

## 2012-12-09 DIAGNOSIS — O10019 Pre-existing essential hypertension complicating pregnancy, unspecified trimester: Secondary | ICD-10-CM | POA: Insufficient documentation

## 2012-12-09 DIAGNOSIS — G40909 Epilepsy, unspecified, not intractable, without status epilepticus: Secondary | ICD-10-CM | POA: Insufficient documentation

## 2012-12-09 DIAGNOSIS — O24919 Unspecified diabetes mellitus in pregnancy, unspecified trimester: Secondary | ICD-10-CM | POA: Insufficient documentation

## 2012-12-09 DIAGNOSIS — O99350 Diseases of the nervous system complicating pregnancy, unspecified trimester: Secondary | ICD-10-CM | POA: Insufficient documentation

## 2012-12-09 DIAGNOSIS — O09529 Supervision of elderly multigravida, unspecified trimester: Secondary | ICD-10-CM | POA: Insufficient documentation

## 2012-12-14 ENCOUNTER — Encounter: Payer: Medicare Other | Admitting: Obstetrics & Gynecology

## 2012-12-27 ENCOUNTER — Other Ambulatory Visit: Payer: Self-pay | Admitting: Obstetrics & Gynecology

## 2012-12-27 DIAGNOSIS — E119 Type 2 diabetes mellitus without complications: Secondary | ICD-10-CM

## 2012-12-27 DIAGNOSIS — O24919 Unspecified diabetes mellitus in pregnancy, unspecified trimester: Secondary | ICD-10-CM

## 2012-12-29 ENCOUNTER — Ambulatory Visit (HOSPITAL_COMMUNITY): Payer: Medicare Other | Attending: Obstetrics & Gynecology

## 2013-02-14 ENCOUNTER — Ambulatory Visit (INDEPENDENT_AMBULATORY_CARE_PROVIDER_SITE_OTHER): Payer: Medicare Other | Admitting: Obstetrics & Gynecology

## 2013-02-14 VITALS — BP 150/99 | Wt 209.0 lb

## 2013-02-14 DIAGNOSIS — R519 Headache, unspecified: Secondary | ICD-10-CM

## 2013-02-14 DIAGNOSIS — O10019 Pre-existing essential hypertension complicating pregnancy, unspecified trimester: Secondary | ICD-10-CM

## 2013-02-14 DIAGNOSIS — O24313 Unspecified pre-existing diabetes mellitus in pregnancy, third trimester: Secondary | ICD-10-CM

## 2013-02-14 DIAGNOSIS — O10913 Unspecified pre-existing hypertension complicating pregnancy, third trimester: Secondary | ICD-10-CM

## 2013-02-14 DIAGNOSIS — Z3480 Encounter for supervision of other normal pregnancy, unspecified trimester: Secondary | ICD-10-CM

## 2013-02-14 DIAGNOSIS — O26893 Other specified pregnancy related conditions, third trimester: Secondary | ICD-10-CM

## 2013-02-14 DIAGNOSIS — O09523 Supervision of elderly multigravida, third trimester: Secondary | ICD-10-CM

## 2013-02-14 DIAGNOSIS — O24919 Unspecified diabetes mellitus in pregnancy, unspecified trimester: Secondary | ICD-10-CM

## 2013-02-14 DIAGNOSIS — O09529 Supervision of elderly multigravida, unspecified trimester: Secondary | ICD-10-CM

## 2013-02-14 DIAGNOSIS — O9989 Other specified diseases and conditions complicating pregnancy, childbirth and the puerperium: Secondary | ICD-10-CM

## 2013-02-14 DIAGNOSIS — O34219 Maternal care for unspecified type scar from previous cesarean delivery: Secondary | ICD-10-CM

## 2013-02-14 NOTE — Progress Notes (Signed)
[id number] 

## 2013-02-14 NOTE — Progress Notes (Signed)
Patient has not been to clinic in 11 weeks. She said she was moving and could not come to her visits.  Does not know what her blood sugar levels are; she said they "could be between 150-200", has 4+ glucosuria today. Only taking one tablet of Labetalol daily, instead of bid as prescribed, and BP 150/99 today.  Reports having constant severe headache not alleviated by Tylenol,  Given her elevated BP and concern about superimposed preeclampsia, she was sent to [facility] for further evaluation.  Also needs CBG check.  She will also need labs: CBC, CMET, protein:creatinine ratio, UDS, HgA1C and a follow up ultrasound.  Patient missed her appointment for fetal ECHO, this needs to be rescheduled. Emphasized that she has a high risk of maternal or fetal morbidity or mortality, but patient doe snot seem to understand there gravity of her situation and her medical conditions.  If she is hospitalized, she will benefit from MFM and NICU consultation. If patient is discharged, she needs to start twice a week testing next week. Of note, patient was hesitant to go to [facility] today, wanted to go to [facility]. She was told that we only practice at [facility] and it is recommended that she go there for evaluation.

## 2013-02-14 NOTE — Patient Instructions (Addendum)
Go to [facility] now because of severe headaches and BP 150/99.  Also need ultrasound to check baby, blood sugar check, labs. No prenatal visit in 10 weeks.  Hypertension During Pregnancy Hypertension is also called high blood pressure. It can occur at any time in life and during pregnancy. When you have hypertension, there is extra pressure inside your blood vessels that carry blood from the heart to the rest of your body (arteries). Hypertension during pregnancy can cause problems for you and your baby. Your baby might not weigh as much as it should at birth or might be born early (premature). Very bad cases of hypertension during pregnancy can be life-threatening.  There are different types of hypertension during pregnancy.   Chronic hypertension. This happens when a woman has hypertension before pregnancy and it continues during pregnancy.  Gestational hypertension. This is when hypertension develops during pregnancy.  Preeclampsia or toxemia of pregnancy. This is a very serious type of hypertension that develops only during pregnancy. It is a disease that affects the whole body (systemic) and can be very dangerous for both mother and baby.  Gestational hypertension and preeclampsia usually go away after your baby is born. Blood pressure generally stabilizes within 6 weeks. Women who have hypertension during pregnancy have a greater chance of developing hypertension later in life or with future pregnancies. UNDERSTANDING BLOOD PRESSURE Blood pressure moves blood in your body. Sometimes, the force that moves the blood becomes too strong.  A blood pressure reading is given in 2 numbers and looks like a fraction.  The top number is called the systolic pressure. When your heart beats, it forces more blood to flow through the arteries. Pressure inside the arteries goes up.  The bottom number is the diastolic pressure. Pressure goes down between beats. That is when the heart is resting.  You may have  hypertension if:  Your systolic blood pressure is above 140.  Your diastolic pressure is above 90. RISK FACTORS Some factors make you more likely to develop hypertension during pregnancy. Risk factors include:  Having hypertension before pregnancy.  Having hypertension during a previous pregnancy.  Being overweight.  Being older than 40.  Being pregnant with more than 1 baby (multiples).  Having diabetes or kidney problems. SYMPTOMS Chronic and gestational hypertension may not cause symptoms. Preeclampsia has symptoms, which may include:  Increased protein in your urine. Your caregiver will check for this at every prenatal visit.  Swelling of your hands and face.  Rapid weight gain.  Headaches.  Visual changes.  Being bothered by light.  Abdominal pain, especially in the right upper area.  Chest pain.  Shortness of breath.  Increased reflexes.  Seizures. Seizures occur with a more severe form of preeclampsia, called eclampsia. DIAGNOSIS   You may be diagnosed with hypertension during pregnancy during a regular prenatal exam. At each visit, tests may include:  Blood pressure checks.  A urine test to check for protein in your urine.  The type of hypertension you are diagnosed with depends on when you developed it. It also depends on your specific blood pressure reading.  Developing hypertension before 20 weeks of pregnancy is consistent with chronic hypertension.  Developing hypertension after 20 weeks of pregnancy is consistent with gestational hypertension.  Hypertension with increased urinary protein is diagnosed as preeclampsia.  Blood pressure measurements that stay above 160 systolic or 110 diastolic are a sign of severe preeclampsia. TREATMENT Treatment for hypertension during pregnancy varies. Treatment depends on the type of hypertension and  how serious it is.  If you take medicine for chronic hypertension, you may need to switch  medicines.  Drugs called ACE inhibitors should not be taken during pregnancy.  Low-dose aspirin may be suggested for women who have risk factors for preeclampsia.  If you have gestational hypertension, you may need to take a blood pressure medicine that is safe during pregnancy. Your caregiver will recommend the appropriate medicine.  If you have severe preeclampsia, you may need to be in the hospital. Caregivers will watch you and the baby very closely. You also may need to take medicine (magnesium sulfate) to prevent seizures and lower blood pressure.  Sometimes an early delivery is needed. This may be the case if the condition worsens. It would be done to protect you and the baby. The only cure for preeclampsia is delivery. HOME CARE INSTRUCTIONS  Schedule and keep all of your regular prenatal care.  Follow your caregiver's instructions for taking medicines. Tell your caregiver about all medicines you take. This includes over-the-counter medicines.  Eat as little salt as possible.  Get regular exercise.  Do not drink alcohol.  Do not use tobacco products.  Do not drink products with caffeine.  Lie on your left side when resting.  Tell your doctor if you have any preeclampsia symptoms. SEEK IMMEDIATE MEDICAL CARE IF:  You have severe abdominal pain.  You have sudden swelling in the hands, ankles, or face.  You gain 4 pounds (1.8 kg) or more in 1 week.  You vomit repeatedly.  You have vaginal bleeding.  You do not feel the baby moving as much.  You have a headache.  You have blurred or double vision.  You have muscle twitching or spasms.  You have shortness of breath.  You have blue fingernails and lips.  You have blood in your urine. MAKE SURE YOU:  Understand these instructions.  Will watch your condition.  Will get help right away if you are not doing well. Document Released: 03/04/2011 Document Revised: 09/08/2011 Document Reviewed:  03/04/2011 [facility] Patient Information 2014 [place], [place].

## 2013-03-02 ENCOUNTER — Encounter (HOSPITAL_COMMUNITY): Payer: Self-pay | Admitting: Obstetrics and Gynecology

## 2013-03-02 ENCOUNTER — Encounter (HOSPITAL_COMMUNITY)
Admission: AD | Disposition: A | Discharge status: Home / Self Care | Payer: Self-pay | Source: Ambulatory Visit | Attending: Obstetrics & Gynecology

## 2013-03-02 ENCOUNTER — Inpatient Hospital Stay (HOSPITAL_COMMUNITY)
Admission: AD | Admit: 2013-03-02 | Discharge: 2013-03-04 | DRG: 765 | Disposition: A | Discharge status: Home / Self Care | Payer: Medicare Other | Source: Ambulatory Visit | Attending: Obstetrics & Gynecology | Admitting: Obstetrics & Gynecology

## 2013-03-02 ENCOUNTER — Inpatient Hospital Stay (HOSPITAL_COMMUNITY): Payer: Medicare Other | Admitting: Anesthesiology

## 2013-03-02 ENCOUNTER — Encounter (HOSPITAL_COMMUNITY): Payer: Self-pay | Admitting: Anesthesiology

## 2013-03-02 ENCOUNTER — Observation Stay: Payer: Self-pay

## 2013-03-02 DIAGNOSIS — O99892 Other specified diseases and conditions complicating childbirth: Secondary | ICD-10-CM

## 2013-03-02 DIAGNOSIS — O9989 Other specified diseases and conditions complicating pregnancy, childbirth and the puerperium: Secondary | ICD-10-CM

## 2013-03-02 DIAGNOSIS — O09522 Supervision of elderly multigravida, second trimester: Secondary | ICD-10-CM

## 2013-03-02 DIAGNOSIS — O24319 Unspecified pre-existing diabetes mellitus in pregnancy, unspecified trimester: Secondary | ICD-10-CM

## 2013-03-02 DIAGNOSIS — O2432 Unspecified pre-existing diabetes mellitus in childbirth: Secondary | ICD-10-CM

## 2013-03-02 DIAGNOSIS — O09529 Supervision of elderly multigravida, unspecified trimester: Secondary | ICD-10-CM | POA: Diagnosis present

## 2013-03-02 DIAGNOSIS — O364XX0 Maternal care for intrauterine death, not applicable or unspecified: Secondary | ICD-10-CM

## 2013-03-02 DIAGNOSIS — IMO0002 Reserved for concepts with insufficient information to code with codable children: Secondary | ICD-10-CM

## 2013-03-02 DIAGNOSIS — O10919 Unspecified pre-existing hypertension complicating pregnancy, unspecified trimester: Secondary | ICD-10-CM

## 2013-03-02 DIAGNOSIS — E119 Type 2 diabetes mellitus without complications: Secondary | ICD-10-CM | POA: Diagnosis present

## 2013-03-02 DIAGNOSIS — Z794 Long term (current) use of insulin: Secondary | ICD-10-CM

## 2013-03-02 DIAGNOSIS — O1002 Pre-existing essential hypertension complicating childbirth: Secondary | ICD-10-CM

## 2013-03-02 DIAGNOSIS — O34219 Maternal care for unspecified type scar from previous cesarean delivery: Secondary | ICD-10-CM | POA: Diagnosis present

## 2013-03-02 DIAGNOSIS — O0930 Supervision of pregnancy with insufficient antenatal care, unspecified trimester: Secondary | ICD-10-CM

## 2013-03-02 DIAGNOSIS — L02219 Cutaneous abscess of trunk, unspecified: Secondary | ICD-10-CM | POA: Diagnosis present

## 2013-03-02 LAB — COMPREHENSIVE METABOLIC PANEL
Albumin: 2.1 g/dL — ABNORMAL LOW (ref 3.5–5.2)
Alkaline Phosphatase: 81 U/L (ref 39–117)
BUN: 8 mg/dL (ref 6–23)
Potassium: 4.3 mEq/L (ref 3.5–5.1)
Sodium: 134 mEq/L — ABNORMAL LOW (ref 135–145)
Total Protein: 6.6 g/dL (ref 6.0–8.3)

## 2013-03-02 LAB — CBC
MCV: 79.8 fL (ref 78.0–100.0)
Platelets: 409 10*3/uL — ABNORMAL HIGH (ref 150–400)
RDW: 13.2 % (ref 11.5–15.5)
WBC: 10.9 10*3/uL — ABNORMAL HIGH (ref 4.0–10.5)

## 2013-03-02 SURGERY — Surgical Case
Anesthesia: Spinal | Site: Abdomen | Wound class: Clean Contaminated

## 2013-03-02 MED ORDER — MEPERIDINE HCL 25 MG/ML IJ SOLN: 6.2500 mg | INTRAMUSCULAR | Status: DC | PRN

## 2013-03-02 MED ORDER — LACTATED RINGERS IV SOLN
INTRAVENOUS | Status: DC
  Administered 2013-03-02 (×3): via INTRAVENOUS

## 2013-03-02 MED ORDER — MORPHINE SULFATE (PF) 0.5 MG/ML IJ SOLN
INTRAMUSCULAR | Status: DC | PRN
  Administered 2013-03-02: .1 ug via INTRATHECAL

## 2013-03-02 MED ORDER — KETOROLAC TROMETHAMINE 60 MG/2ML IM SOLN
60.0000 mg | Freq: Once | INTRAMUSCULAR | Status: AC | PRN
  Administered 2013-03-02: 60 mg via INTRAMUSCULAR

## 2013-03-02 MED ORDER — DIPHENHYDRAMINE HCL 25 MG PO CAPS
25.0000 mg | ORAL_CAPSULE | ORAL | Status: DC | PRN
  Filled 2013-03-02: qty 1

## 2013-03-02 MED ORDER — CITRIC ACID-SODIUM CITRATE 334-500 MG/5ML PO SOLN
30.0000 mL | Freq: Once | ORAL | Status: AC
  Administered 2013-03-02: 30 mL via ORAL
  Filled 2013-03-02: qty 15

## 2013-03-02 MED ORDER — FENTANYL CITRATE 0.05 MG/ML IJ SOLN
INTRAMUSCULAR | Status: AC
  Filled 2013-03-02: qty 2

## 2013-03-02 MED ORDER — ONDANSETRON HCL 4 MG/2ML IJ SOLN
INTRAMUSCULAR | Status: AC
  Filled 2013-03-02: qty 2

## 2013-03-02 MED ORDER — MORPHINE SULFATE 0.5 MG/ML IJ SOLN
INTRAMUSCULAR | Status: AC
  Filled 2013-03-02: qty 10

## 2013-03-02 MED ORDER — KETOROLAC TROMETHAMINE 60 MG/2ML IM SOLN
INTRAMUSCULAR | Status: AC
  Filled 2013-03-02: qty 2

## 2013-03-02 MED ORDER — SCOPOLAMINE 1 MG/3DAYS TD PT72
1.0000 | MEDICATED_PATCH | Freq: Once | TRANSDERMAL | Status: DC
  Administered 2013-03-02: 1.5 mg via TRANSDERMAL

## 2013-03-02 MED ORDER — LABETALOL HCL 200 MG PO TABS
200.0000 mg | ORAL_TABLET | Freq: Two times a day (BID) | ORAL | Status: DC
  Administered 2013-03-02: 200 mg via ORAL

## 2013-03-02 MED ORDER — OXYTOCIN 10 UNIT/ML IJ SOLN
40.0000 [IU] | INTRAVENOUS | Status: DC | PRN
  Administered 2013-03-02: 40 [IU] via INTRAVENOUS

## 2013-03-02 MED ORDER — OXYTOCIN BOLUS FROM INFUSION: 500.0000 mL | INTRAVENOUS | Status: DC

## 2013-03-02 MED ORDER — DIPHENHYDRAMINE HCL 50 MG/ML IJ SOLN: 25.0000 mg | INTRAMUSCULAR | Status: DC | PRN

## 2013-03-02 MED ORDER — BUPIVACAINE LIPOSOME 1.3 % IJ SUSP
20.0000 mL | Freq: Once | INTRAMUSCULAR | Status: DC
  Filled 2013-03-02: qty 20

## 2013-03-02 MED ORDER — LABETALOL HCL 200 MG PO TABS
200.0000 mg | ORAL_TABLET | Freq: Once | ORAL | Status: DC
  Filled 2013-03-02: qty 1

## 2013-03-02 MED ORDER — SODIUM CHLORIDE 0.9 % IJ SOLN
INTRAMUSCULAR | Status: DC | PRN
  Administered 2013-03-02: 40 mL via INTRAVENOUS

## 2013-03-02 MED ORDER — 0.9 % SODIUM CHLORIDE (POUR BTL) OPTIME
TOPICAL | Status: DC | PRN
  Administered 2013-03-02: 1000 mL

## 2013-03-02 MED ORDER — BUPIVACAINE LIPOSOME 1.3 % IJ SUSP
INTRAMUSCULAR | Status: DC | PRN
  Administered 2013-03-02: 20 mL

## 2013-03-02 MED ORDER — FENTANYL CITRATE 0.05 MG/ML IJ SOLN
25.0000 ug | INTRAMUSCULAR | Status: DC | PRN
  Administered 2013-03-02 – 2013-03-03 (×2): 50 ug via INTRAVENOUS

## 2013-03-02 MED ORDER — OXYTOCIN 40 UNITS IN LACTATED RINGERS INFUSION - SIMPLE MED: 62.5000 mL/h | INTRAVENOUS | Status: DC

## 2013-03-02 MED ORDER — CEFAZOLIN SODIUM-DEXTROSE 2-3 GM-% IV SOLR
2.0000 g | Freq: Once | INTRAVENOUS | Status: AC
  Administered 2013-03-02: 2 g via INTRAVENOUS
  Filled 2013-03-02: qty 50

## 2013-03-02 MED ORDER — DIPHENHYDRAMINE HCL 50 MG/ML IJ SOLN
12.5000 mg | INTRAMUSCULAR | Status: DC | PRN
  Administered 2013-03-02: 12.5 mg via INTRAVENOUS

## 2013-03-02 MED ORDER — LABETALOL HCL 200 MG PO TABS
200.0000 mg | ORAL_TABLET | Freq: Two times a day (BID) | ORAL | Status: DC
  Filled 2013-03-02 (×3): qty 1

## 2013-03-02 MED ORDER — FENTANYL CITRATE 0.05 MG/ML IJ SOLN
INTRAMUSCULAR | Status: DC | PRN
  Administered 2013-03-02: 15 ug via INTRATHECAL

## 2013-03-02 MED ORDER — DIPHENHYDRAMINE HCL 50 MG/ML IJ SOLN
INTRAMUSCULAR | Status: AC
  Filled 2013-03-02: qty 1

## 2013-03-02 MED ORDER — CEFAZOLIN (ANCEF) 1 G IV SOLR
2.0000 g | INTRAVENOUS | Status: DC
  Filled 2013-03-02: qty 2

## 2013-03-02 MED ORDER — BUPIVACAINE IN DEXTROSE 0.75-8.25 % IT SOLN
INTRATHECAL | Status: DC | PRN
  Administered 2013-03-02: 10.5 mg via INTRATHECAL

## 2013-03-02 MED ORDER — OXYTOCIN 10 UNIT/ML IJ SOLN
INTRAMUSCULAR | Status: AC
  Filled 2013-03-02: qty 2

## 2013-03-02 MED ORDER — METOCLOPRAMIDE HCL 5 MG/ML IJ SOLN: 10.0000 mg | Freq: Once | INTRAMUSCULAR | Status: AC | PRN

## 2013-03-02 MED ORDER — SCOPOLAMINE 1 MG/3DAYS TD PT72
MEDICATED_PATCH | TRANSDERMAL | Status: AC
  Filled 2013-03-02: qty 1

## 2013-03-02 MED ORDER — ONDANSETRON HCL 4 MG/2ML IJ SOLN
INTRAMUSCULAR | Status: DC | PRN
  Administered 2013-03-02: 4 mg via INTRAVENOUS

## 2013-03-02 SURGICAL SUPPLY — 36 items
CLAMP CORD UMBIL (MISCELLANEOUS) IMPLANT
CLOTH BEACON ORANGE TIMEOUT ST (SAFETY) ×2 IMPLANT
DERMABOND ADVANCED (GAUZE/BANDAGES/DRESSINGS) ×1
DERMABOND ADVANCED .7 DNX12 (GAUZE/BANDAGES/DRESSINGS) ×1 IMPLANT
DRAPE LG THREE QUARTER DISP (DRAPES) ×2 IMPLANT
DRSG OPSITE POSTOP 4X10 (GAUZE/BANDAGES/DRESSINGS) ×2 IMPLANT
DURAPREP 26ML APPLICATOR (WOUND CARE) ×4 IMPLANT
ELECT REM PT RETURN 9FT ADLT (ELECTROSURGICAL) ×2
ELECTRODE REM PT RTRN 9FT ADLT (ELECTROSURGICAL) ×1 IMPLANT
EXTRACTOR VACUUM BELL STYLE (SUCTIONS) IMPLANT
GLOVE BIOGEL PI IND STRL 8 (GLOVE) ×1 IMPLANT
GLOVE BIOGEL PI INDICATOR 8 (GLOVE) ×1
GLOVE ECLIPSE 8.0 STRL XLNG CF (GLOVE) ×2 IMPLANT
GOWN STRL REIN XL XLG (GOWN DISPOSABLE) ×4 IMPLANT
KIT ABG SYR 3ML LUER SLIP (SYRINGE) IMPLANT
NEEDLE HYPO 18GX1.5 BLUNT FILL (NEEDLE) ×2 IMPLANT
NEEDLE HYPO 22GX1.5 SAFETY (NEEDLE) ×2 IMPLANT
NEEDLE HYPO 25X5/8 SAFETYGLIDE (NEEDLE) IMPLANT
NS IRRIG 1000ML POUR BTL (IV SOLUTION) ×2 IMPLANT
PACK C SECTION WH (CUSTOM PROCEDURE TRAY) ×2 IMPLANT
PAD OB MATERNITY 4.3X12.25 (PERSONAL CARE ITEMS) ×2 IMPLANT
RTRCTR C-SECT PINK 25CM LRG (MISCELLANEOUS) IMPLANT
STAPLER VISISTAT 35W (STAPLE) IMPLANT
SUT CHROMIC 0 CT 1 (SUTURE) ×2 IMPLANT
SUT MNCRL 0 VIOLET CTX 36 (SUTURE) ×3 IMPLANT
SUT MONOCRYL 0 CTX 36 (SUTURE) ×3
SUT PLAIN 2 0 (SUTURE)
SUT PLAIN 2 0 XLH (SUTURE) IMPLANT
SUT PLAIN ABS 2-0 CT1 27XMFL (SUTURE) IMPLANT
SUT VIC AB 0 CTX 36 (SUTURE) ×1
SUT VIC AB 0 CTX36XBRD ANBCTRL (SUTURE) ×1 IMPLANT
SUT VIC AB 4-0 KS 27 (SUTURE) ×2 IMPLANT
SYR 20CC LL (SYRINGE) ×4 IMPLANT
TOWEL OR 17X24 6PK STRL BLUE (TOWEL DISPOSABLE) ×2 IMPLANT
TRAY FOLEY CATH 14FR (SET/KITS/TRAYS/PACK) ×2 IMPLANT
WATER STERILE IRR 1000ML POUR (IV SOLUTION) IMPLANT

## 2013-03-02 NOTE — Op Note (Deleted)
reoperative diagnosis:  1.  Intrauterine pregnancy at [redacted]w[redacted]d weeks gestation                                         2.  Intraunterine fetal demise                                         3.  Previous c/s x1   Postoperative diagnosis:  Same as above   Procedure:  Repeat cesarean section  Surgeon:  Lazaro Arms MD  Assistant:  Dr. Rulon Abide  Anesthesia: Spinal  FINDINGS:  nonviable female infant in cephalic presentation appearing to have been deceased for several days.  Dark meconium stained amniotic fluid.  Intact placenta with calcification, three vessel cord.  Normal uterus, fallopian tubes and ovaries bilaterally.    Description of operation:   Patient was taken to the operating room and placed in the sitting position where she underwent a spinal anesthetic. She was then placed in the supine position with tilt to the left side. When adequate anesthetic level was obtained she was prepped and draped in usual sterile fashion and a Foley catheter was placed. A Pfannenstiel skin incision was made and carried down sharply to the rectus fascia which was scored in the midline extended laterally. The fascia was taken off the muscles both superiorly and without difficulty. The muscles were divided.  The peritoneal cavity was entered.  Bladder blade was placed, no bladder flap was created.  A low transverse hysterotomy incision was made and delivered a [redacted]w[redacted]d fetal demise with a tight double nuchal cord.  The uterus was exteriorized. It was closed in 2 layers, the first being a running interlocking layer and the second being an imbricating layer using 0 monocryl on a CTX needle. There was good resulting hemostasis. The uterus tubes and ovaries were all normal. Peritoneal cavity was irrigated vigorously. The muscles and peritoneum were reapproximated loosely. The fascia was closed using 0 Vicryl in running fashion. Subcutaneous tissue was made hemostatic and irrigated. The skin was closed using 4-0 Vicryl on  a Keith needle in a subcuticular fashion.  Dermabond was placed for additional wound integrity and to serve as a barrier. Blood loss for the procedure was 250 cc. The patient received a gram of Ancef prophylactically. The patient was taken to the recovery room in good stable condition with all counts being correct x3.  EBL 250 cc

## 2013-03-02 NOTE — Op Note (Signed)
Preoperative diagnosis:  1.  Intrauterine fetal demise at 34 4/[redacted] weeks gestation                                         2.  Class C Diabetes, poor compliance with prenatal care                                         3.  Chronic Hypertension                                         4.  Previous Caesarean section                                         5.  Multiple abdominal sites of cellulitis and developing into boils  Postoperative diagnosis:  Same as above plus double nuchal cord x 2, appears to have passed away several days ago  Procedure:  Repeat cesarean section  Surgeon:  Lazaro Arms MD  Assistant:  Dia Crawford, MD  Anesthesia: Spinal  Findings:  The patient has had poor compliance with her prenatal care up until this point and has had poor compliance with her insulin therapy .  She states that she checks her blood sugar 4 times a day in the average 1:30 fasting and 175-250 after meals.  She had an appointment in June 8 and did not return to the clinic at [facility] until August 13.  In she subsequently missed her next appointment is well.  The patient states that she experienced pain today around 1 PM today.  She denies having had bleeding or rupture of membranes or cyclical nature of the pain.  EMS was called and the patient was taken to the [facility].  There she was found to have an intrauterine fetal demise. Subsequently she was transferred to our care here at [facility].  I counseled the patient at some length regarding  management of a fetal demise and she was completely uninterested in a vaginal trial and that was confirmed vehemently by the father of the baby .  I had no choice but to offer the patient a repeat cesarean section and she was not willing to entertain a cervical ripening or induction of labor   Over a low transverse incision was delivered a non viable female with Apgars of 0 and 0 weighing  Pending.   the fetus was macerated appear to  have been dead for quite a number of days up to a week.  There was a double nuchal cord.  The placenta appeared to be normal there was no abruption.  There were no gross abnormalities with the fetus other than the changes consistent with having died in utero several days ago.  The parents were considering an autopsy.  Uterus, tubes and ovaries were all normal.  There were no other significant findings.  Description of operation:  Patient was taken to the operating room and placed in the sitting position where she underwent a spinal anesthetic. She was then placed in the supine position with tilt to the left  side. When adequate anesthetic level was obtained she was prepped and draped in usual sterile fashion and a Foley catheter was placed. A Pfannenstiel skin incision was made and carried down sharply to the rectus fascia which was scored in the midline extended laterally. The fascia was taken off the muscles both superiorly and without difficulty. The muscles were divided.  The peritoneal cavity was entered.  Bladder blade was placed, no bladder flap was created.  A low transverse hysterotomy incision was made and delivered a non viable female  infant  with Apgars of 0 and 0 weighing lbs pending.    The uterus was exteriorized. It was closed in 2 layers, the first being a running interlocking layer and the second being an imbricating layer using 0 monocryl on a CTX needle. There was good resulting hemostasis. The uterus tubes and ovaries were all normal. Peritoneal cavity was irrigated vigorously. The muscles and peritoneum were reapproximated loosely. The fascia was closed using 0 Vicryl in running fashion. Subcutaneous tissue was made hemostatic and irrigated. The skin was closed using 4-0 Vicryl on a Keith needle in a subcuticular fashion.  Dermabond was placed for additional wound integrity and to serve as a barrier. Blood loss for the procedure was 500 cc. The patient received 2 gram of Ancef  prophylactically. The patient was taken to the recovery room in good stable condition with all counts being correct x3.  EBL 500 cc  Bertran Zeimet H 03/02/2013 10:45 PM

## 2013-03-02 NOTE — Progress Notes (Signed)
Upon walking in to room, pt asks "yall aren't going to make me push out a dead baby are you?"; informed pt that providers will come talk to her about POC; faculty practice notified - E. Kanon Colunga RNC

## 2013-03-02 NOTE — Transfer of Care (Signed)
Immediate Anesthesia Transfer of Care Note  Patient: Kristy Mcmillan  Procedure(s) Performed: Procedure(s) with comments: CESAREAN SECTION (N/A) - Repeat Cesarean Section Delivery Nonviable Baby Girl @ 2158  Patient Location: PACU  Anesthesia Type:Spinal  Level of Consciousness: awake, alert , oriented and patient cooperative  Airway & Oxygen Therapy: Patient Spontanous Breathing  Post-op Assessment: Report given to PACU RN and Post -op Vital signs reviewed and stable  Post vital signs: Reviewed and stable  Complications: No apparent anesthesia complications

## 2013-03-02 NOTE — Progress Notes (Deleted)
Pt's CBG was 204 at 2250 after eating dinner for a second time... Dr. Ellyn Hack notified... No new orders at this time... Encouraged pt to incorporate more fresh foods into diet, and to drink more water to promote incisional healing... Pt verbalizes understanding... Will continue to monitor pt.

## 2013-03-02 NOTE — Anesthesia Preprocedure Evaluation (Addendum)
Anesthesia Evaluation  Patient identified by MRN, date of birth, ID band Patient awake    Reviewed: Allergy & Precautions, H&P , NPO status , Patient's Chart, lab work & pertinent test results, reviewed documented beta blocker date and time   History of Anesthesia Complications Negative for: history of anesthetic complications  Airway Mallampati: I TM Distance: >3 FB Neck ROM: full    Dental  (+)    Pulmonary neg pulmonary ROS,  breath sounds clear to auscultation        Cardiovascular hypertension, On Home Beta Blockers Rhythm:regular Rate:Normal     Neuro/Psych  Headaches, Seizures - (last was in 2005, no meds in 5 years),  negative psych ROS   GI/Hepatic Neg liver ROS, GERD-  Medicated,  Endo/Other  diabetes, Poorly Controlled, Type 2, Insulin DependentMorbid obesity  Renal/GU negative Renal ROS  negative genitourinary   Musculoskeletal   Abdominal   Peds  Hematology negative hematology ROS (+)   Anesthesia Other Findings   Reproductive/Obstetrics (+) Pregnancy (33 wks, IUFD, h/o C/S x1 for repeat C/S)                         Anesthesia Physical Anesthesia Plan  ASA: III  Anesthesia Plan: Spinal   Post-op Pain Management:    Induction:   Airway Management Planned:   Additional Equipment:   Intra-op Plan:   Post-operative Plan:   Informed Consent: I have reviewed the patients History and Physical, chart, labs and discussed the procedure including the risks, benefits and alternatives for the proposed anesthesia with the patient or authorized representative who has indicated his/her understanding and acceptance.     Plan Discussed with: CRNA and Surgeon  Anesthesia Plan Comments:         Anesthesia Quick Evaluation

## 2013-03-02 NOTE — H&P (Signed)
Kristy Mcmillan is a 37 y.o. female G2P1001 at [redacted]w[redacted]d dated by 6 wk scan transferred from [facility] where she presented for lower abdominal pain and was found to have fetal demise. PNC at [facility] with lapses in care. Taking hr labetalol for CHTN and insulin as directed. CBGs elevated, no log book, reports 171 this afternoon. Last solids 12:30. Ice at 1600.   Maternal Medical History:  Reason for admission: Nausea.    OB History   Grav Para Term Preterm Abortions TAB SAB Ect Mult Living   2 1 1       1     P1: 1996 in [place] had CS at 40 wks d/t sz (sz d/o)  wt 6#2  Past Medical History  Diagnosis Date  . Seizures 2005 Last sz    childhood febrile sz, then ?eclampsia  . Diabetes mellitus without complication    Past Surgical History  Procedure Laterality Date  . Cesarean section     Family History: family history includes Diabetes in her father and mother. Social History:  reports that she has never smoked. She does not have any smokeless tobacco history on file. She reports that she does not drink alcohol or use illicit drugs.    Review of Systems  Constitutional: Negative for fever and malaise/fatigue.  Eyes: Negative for blurred vision.  Respiratory: Negative for cough.   Cardiovascular: Negative for chest pain and palpitations.  Gastrointestinal: Positive for abdominal pain. Negative for nausea and vomiting.       Mild intermittent lower abdominal pain  Genitourinary: Negative for dysuria.  Musculoskeletal: Negative for back pain.  Skin: Negative for rash.  Neurological: Negative for headaches.  Psychiatric/Behavioral: Negative for depression, suicidal ideas and substance abuse.      Blood pressure 147/86, pulse 109, temperature 99 F (37.2 C), temperature source Oral, resp. rate 20, last menstrual period 06/25/2012. Maternal Exam:  Uterine Assessment: Contraction strength is mild.  Contraction frequency is irregular.   Abdomen: Surgical scars: low  transverse.   Fundal height is approx 30 wk size.    Introitus: Not examined  Cervix: Not examined  Fetal Exam Fetal Monitor Review: n/a  Fetal State Assessment: n/a  Physical Exam  Constitutional: She is oriented to person, place, and time. She appears well-developed and well-nourished.  HENT:  Head: Normocephalic and atraumatic.  Eyes: Pupils are equal, round, and reactive to light.  Neck: Normal range of motion. Neck supple. No thyromegaly present.  Cardiovascular: Normal rate, regular rhythm and normal heart sounds.   Respiratory: Breath sounds normal. She is in respiratory distress.  GI: Soft. She exhibits no distension. There is no tenderness.  Genitourinary:  Not examined  Musculoskeletal: Normal range of motion.  Neurological: She is alert and oriented to person, place, and time.  Psychiatric: She has a normal mood and affect. Her behavior is normal. Judgment and thought content normal.  Appropriate grief    Prenatal labs: ABO, Rh: O/POS/-- (04/28 1207) Antibody: NEG (04/28 1207) Rubella: 5.66 (04/28 1207) RPR: NON REAC (04/28 1207)  HBsAg: NEGATIVE (04/28 1207)  HIV: NON REACTIVE (04/28 1207)  GBS:   unknown  Assessment/Plan: 37 yo G2P1001 at [redacted]w[redacted]d with fetal demise> counseled on TOLAC vs R C/S and desires repeat C/S. Dr. Despina Hidden will see pt. Type 2 DM CHTN Lapses in [facility]   Kristy Mcmillan 03/02/2013, 7:36 PM

## 2013-03-02 NOTE — Anesthesia Procedure Notes (Signed)
Spinal  Patient location during procedure: OR Start time: 03/02/2013 9:29 PM Staffing Anesthesiologist: CASSIDY, AMY Performed by: anesthesiologist  Preanesthetic Checklist Completed: patient identified, site marked, surgical consent, pre-op evaluation, timeout performed, IV checked, risks and benefits discussed and monitors and equipment checked Spinal Block Patient position: sitting Prep: site prepped and draped and DuraPrep Patient monitoring: heart rate, cardiac monitor, continuous pulse ox and blood pressure Approach: midline Location: L3-4 Injection technique: single-shot Needle Needle type: Pencan  Needle gauge: 24 G Needle length: 9 cm Assessment Sensory level: T4 Additional Notes Clear free flow CSF on first attempt.  No paresthesia.  Patient tolerated procedure well with no apparent complications.  Jasmine December, MD

## 2013-03-03 ENCOUNTER — Encounter (HOSPITAL_COMMUNITY): Payer: Self-pay | Admitting: *Deleted

## 2013-03-03 LAB — CBC
Hemoglobin: 10.2 g/dL — ABNORMAL LOW (ref 12.0–15.0)
MCH: 28 pg (ref 26.0–34.0)
MCHC: 34.6 g/dL (ref 30.0–36.0)
Platelets: 281 10*3/uL (ref 150–400)

## 2013-03-03 LAB — GLUCOSE, CAPILLARY: Glucose-Capillary: 165 mg/dL — ABNORMAL HIGH (ref 70–99)

## 2013-03-03 LAB — COMPREHENSIVE METABOLIC PANEL
ALT: 5 U/L (ref 0–35)
Albumin: 1.7 g/dL — ABNORMAL LOW (ref 3.5–5.2)
Alkaline Phosphatase: 60 U/L (ref 39–117)
BUN: 9 mg/dL (ref 6–23)
Chloride: 102 mEq/L (ref 96–112)
GFR calc Af Amer: >90 mL/min (ref >90)
Glucose, Bld: 203 mg/dL — ABNORMAL HIGH (ref 70–99)
Potassium: 3.8 mEq/L (ref 3.5–5.1)
Total Bilirubin: 0.2 mg/dL — ABNORMAL LOW (ref 0.3–1.2)

## 2013-03-03 MED ORDER — IBUPROFEN 600 MG PO TABS
600.0000 mg | ORAL_TABLET | Freq: Four times a day (QID) | ORAL | Status: DC
  Administered 2013-03-03 – 2013-03-04 (×5): 600 mg via ORAL
  Filled 2013-03-03 (×5): qty 1

## 2013-03-03 MED ORDER — ONDANSETRON HCL 4 MG/2ML IJ SOLN: 4.0000 mg | INTRAMUSCULAR | Status: DC | PRN

## 2013-03-03 MED ORDER — TETANUS-DIPHTH-ACELL PERTUSSIS 5-2.5-18.5 LF-MCG/0.5 IM SUSP: 0.5000 mL | Freq: Once | INTRAMUSCULAR | Status: DC

## 2013-03-03 MED ORDER — NALOXONE HCL 0.4 MG/ML IJ SOLN: 0.4000 mg | INTRAMUSCULAR | Status: DC | PRN

## 2013-03-03 MED ORDER — DIPHENHYDRAMINE HCL 25 MG PO CAPS: 25.0000 mg | ORAL_CAPSULE | Freq: Four times a day (QID) | ORAL | Status: DC | PRN

## 2013-03-03 MED ORDER — LACTATED RINGERS IV BOLUS (SEPSIS)
500.0000 mL | Freq: Once | INTRAVENOUS | Status: AC
  Administered 2013-03-03: 500 mL via INTRAVENOUS

## 2013-03-03 MED ORDER — KETOROLAC TROMETHAMINE 30 MG/ML IJ SOLN: 30.0000 mg | Freq: Four times a day (QID) | INTRAMUSCULAR | Status: DC | PRN

## 2013-03-03 MED ORDER — INSULIN ASPART 100 UNIT/ML ~~LOC~~ SOLN
0.0000 [IU] | Freq: Three times a day (TID) | SUBCUTANEOUS | Status: DC
  Administered 2013-03-03 (×2): 2 [IU] via SUBCUTANEOUS
  Administered 2013-03-04: 3 [IU] via SUBCUTANEOUS

## 2013-03-03 MED ORDER — LACTATED RINGERS IV SOLN: INTRAVENOUS | Status: DC

## 2013-03-03 MED ORDER — LANOLIN HYDROUS EX OINT: 1.0000 "application " | TOPICAL_OINTMENT | CUTANEOUS | Status: DC | PRN

## 2013-03-03 MED ORDER — INSULIN ASPART 100 UNIT/ML ~~LOC~~ SOLN
6.0000 [IU] | Freq: Three times a day (TID) | SUBCUTANEOUS | Status: DC
  Administered 2013-03-03 – 2013-03-04 (×3): 6 [IU] via SUBCUTANEOUS

## 2013-03-03 MED ORDER — DIBUCAINE 1 % RE OINT: 1.0000 "application " | TOPICAL_OINTMENT | RECTAL | Status: DC | PRN

## 2013-03-03 MED ORDER — SIMETHICONE 80 MG PO CHEW
80.0000 mg | CHEWABLE_TABLET | Freq: Three times a day (TID) | ORAL | Status: DC
  Administered 2013-03-03 – 2013-03-04 (×3): 80 mg via ORAL

## 2013-03-03 MED ORDER — NALBUPHINE SYRINGE 5 MG/0.5 ML
5.0000 mg | INJECTION | INTRAMUSCULAR | Status: DC | PRN
  Filled 2013-03-03: qty 1

## 2013-03-03 MED ORDER — NALOXONE HCL 1 MG/ML IJ SOLN
1.0000 ug/kg/h | INTRAVENOUS | Status: DC | PRN
  Filled 2013-03-03: qty 2

## 2013-03-03 MED ORDER — INSULIN ASPART 100 UNIT/ML ~~LOC~~ SOLN
0.0000 [IU] | Freq: Every day | SUBCUTANEOUS | Status: DC
Start: 2013-03-03 — End: 2013-03-04
  Administered 2013-03-03: 3 [IU] via SUBCUTANEOUS

## 2013-03-03 MED ORDER — INSULIN ASPART 100 UNIT/ML ~~LOC~~ SOLN
4.0000 [IU] | Freq: Three times a day (TID) | SUBCUTANEOUS | Status: DC
  Administered 2013-03-03: 4 [IU] via SUBCUTANEOUS

## 2013-03-03 MED ORDER — INSULIN NPH (HUMAN) (ISOPHANE) 100 UNIT/ML ~~LOC~~ SUSP
6.0000 [IU] | Freq: Two times a day (BID) | SUBCUTANEOUS | Status: DC
  Administered 2013-03-03: 6 [IU] via SUBCUTANEOUS
  Filled 2013-03-03: qty 10

## 2013-03-03 MED ORDER — PRENATAL MULTIVITAMIN CH: 1.0000 | ORAL_TABLET | Freq: Every day | ORAL | Status: DC

## 2013-03-03 MED ORDER — ONDANSETRON HCL 4 MG PO TABS: 4.0000 mg | ORAL_TABLET | ORAL | Status: DC | PRN

## 2013-03-03 MED ORDER — INSULIN GLARGINE 100 UNIT/ML ~~LOC~~ SOLN
40.0000 [IU] | Freq: Every day | SUBCUTANEOUS | Status: DC
  Filled 2013-03-03 (×2): qty 0.4

## 2013-03-03 MED ORDER — WITCH HAZEL-GLYCERIN EX PADS: 1.0000 "application " | MEDICATED_PAD | CUTANEOUS | Status: DC | PRN

## 2013-03-03 MED ORDER — SIMETHICONE 80 MG PO CHEW
80.0000 mg | CHEWABLE_TABLET | ORAL | Status: DC | PRN
  Administered 2013-03-03: 80 mg via ORAL

## 2013-03-03 MED ORDER — OXYTOCIN 40 UNITS IN LACTATED RINGERS INFUSION - SIMPLE MED: 62.5000 mL/h | INTRAVENOUS | Status: DC

## 2013-03-03 MED ORDER — MENTHOL 3 MG MT LOZG: 1.0000 | LOZENGE | OROMUCOSAL | Status: DC | PRN

## 2013-03-03 MED ORDER — ONDANSETRON HCL 4 MG/2ML IJ SOLN: 4.0000 mg | Freq: Three times a day (TID) | INTRAMUSCULAR | Status: DC | PRN

## 2013-03-03 MED ORDER — ZOLPIDEM TARTRATE 5 MG PO TABS: 5.0000 mg | ORAL_TABLET | Freq: Every evening | ORAL | Status: DC | PRN

## 2013-03-03 MED ORDER — OXYCODONE-ACETAMINOPHEN 5-325 MG PO TABS
1.0000 | ORAL_TABLET | ORAL | Status: DC | PRN
  Administered 2013-03-03: 2 via ORAL
  Administered 2013-03-03: 1 via ORAL
  Administered 2013-03-03 – 2013-03-04 (×3): 2 via ORAL
  Filled 2013-03-03 (×4): qty 2
  Filled 2013-03-03: qty 1

## 2013-03-03 MED ORDER — LACTATED RINGERS IV SOLN
INTRAVENOUS | Status: DC
  Administered 2013-03-03 (×2): via INTRAVENOUS

## 2013-03-03 MED ORDER — SENNOSIDES-DOCUSATE SODIUM 8.6-50 MG PO TABS
2.0000 | ORAL_TABLET | Freq: Every day | ORAL | Status: DC
  Administered 2013-03-03: 2 via ORAL

## 2013-03-03 MED ORDER — METOCLOPRAMIDE HCL 5 MG/ML IJ SOLN: 10.0000 mg | Freq: Three times a day (TID) | INTRAMUSCULAR | Status: DC | PRN

## 2013-03-03 MED ORDER — DOXYCYCLINE HYCLATE 100 MG PO TABS
100.0000 mg | ORAL_TABLET | Freq: Two times a day (BID) | ORAL | Status: DC
  Administered 2013-03-03 – 2013-03-04 (×3): 100 mg via ORAL
  Filled 2013-03-03 (×3): qty 1

## 2013-03-03 MED ORDER — SODIUM CHLORIDE 0.9 % IJ SOLN: 3.0000 mL | INTRAMUSCULAR | Status: DC | PRN

## 2013-03-03 NOTE — Progress Notes (Signed)
UR completed 

## 2013-03-03 NOTE — Progress Notes (Signed)
Subjective: Postpartum Day 1: Cesarean Delivery for IUFD  Patient has slept most of the night. Foley still in and has decreased UOP.  Pain well controlled. Has passed gas. Not been up yet.  Emotionally doing "ok"  Objective: Vital signs in last 24 hours: Temp:  [97.7 F (36.5 C)-99 F (37.2 C)] 98.4 F (36.9 C) (09/04 0745) Pulse Rate:  [84-115] 95 (09/04 0745) Resp:  [12-29] 18 (09/04 0745) BP: (103-149)/(59-95) 103/62 mmHg (09/04 0745) SpO2:  [95 %-100 %] 97 % (09/04 0745) Weight:  [94.802 kg (209 lb)] 94.802 kg (209 lb) (09/03 2048)  Physical Exam:  General: alert, cooperative and no distress Lochia: appropriate Uterine Fundus: firm Incision: healing well, no significant drainage, no dehiscence, no significant erythema DVT Evaluation: No evidence of DVT seen on physical exam.   Recent Labs  03/02/13 2023 03/03/13 0530  HGB 13.2 10.2*  HCT 36.7 29.5*    Assessment/Plan: Status post Cesarean section. Doing well postoperatively.  UOP decreased- increase fluids to 200cc/hr as responded well to bolus overnight and check CMP gHTN- pressures WNL  DM- fasting still high this am. Cont NPH 6U BID and SSI for now to determine total daily need and adjust accordingly  Continue current care.  Chaniya Genter L 03/03/2013, 8:32 AM

## 2013-03-03 NOTE — Progress Notes (Signed)
03/03/13 1300  Clinical Encounter Type  Visited With Patient;Patient and family together  Visit Type Initial  Referral From Nurse Cristie Hem, RN)  Consult/Referral To Social work Nobie Put[place], [place])   Made brief initial visit to offer bereavement support.  Kristy Mcmillan was just beginning to process the loss of her baby  Provided pastoral presence and brief grief education.  Our visit was interrupted by arrival of visitors (significant other Kristy Mcmillan, pastor Kristy Mcmillan of [employer] [address] [facility] [place], Kristy Mcmillan's son (age 37), and others).    [address] [place], [place] [phone]

## 2013-03-03 NOTE — Anesthesia Postprocedure Evaluation (Signed)
Anesthesia Post Note  Patient: Kristy Mcmillan  Procedure(s) Performed: Procedure(s) (LRB): CESAREAN SECTION (N/A)  Anesthesia type: SAB  Patient location: Mother/Baby  Post pain: Pain level controlled  Post assessment: Post-op Vital signs reviewed  Last Vitals:  Filed Vitals:   03/03/13 0745  BP: 103/62  Pulse: 95  Temp: 36.9 C  Resp: 18    Post vital signs: Reviewed  Level of consciousness: awake  Complications: No apparent anesthesia complications

## 2013-03-03 NOTE — Progress Notes (Addendum)
Inpatient Diabetes Program Recommendations  AACE/ADA: New Consensus Statement on Inpatient Glycemic Control (2013)  Target Ranges:  Prepandial:   less than 140 mg/dL      Peak postprandial:   less than 180 mg/dL (1-2 hours)      Critically ill patients:  140 - 180 mg/dL   Post-partum pt with pre-exisitng type 2 diabetes (IUFD by c/section) Noted plan to restart her 60 units bid and correction scale that she was suppose to be on during the pregnancy. Now that pt is delivered, I would recommend that she be started on Lantus and Novolog according to weight.  Using the Glycemic control order set Total daily dose based on weight of 95 kg, pt should require a basal dose of approxmately 38-40 units daily.of lantus or levemir. Noting that pt will not count carbs on her own, would suggest starting her correction insulin with the resistant scale which begins with 3 units at 121 and the HS scale.  (All orders can be found by using the "Order Set" search for glycemic control order set."   Please feel free to call for assistance if needed.  AD  Spoke with Dr. Debroah Loop who agrees to start basal lantus and novolog meal coverage of 6 units tidwc and sensitive correction tidwc. (It may be doubtful that a correction scale at home may not be appropriate due to the fact that she probably won't check cbg's often enough to use. However, we can assess insulin needs farther by amount of correction needed regarding basal and meal coverage.  AD Spoke with pharmacy regarding the warnings for allergic type reactions to the insulins ordered stating levemir causes headache and seizure, lispro and lantus causing rash.  Pt needs some insulin presently, therefore, agreed with recommendations from pharmacy to try the lantus and novolog since they have the least potential side-effects.  Thank you, Lenor Coffin, RN, Diabetes CNS [phone])

## 2013-03-03 NOTE — Anesthesia Postprocedure Evaluation (Deleted)
Anesthesia Post Note  Patient: Kristy Mcmillan  Procedure(s) Performed: Procedure(s) (LRB): CESAREAN SECTION (N/A)  Anesthesia type: General  Patient location: Mother/Baby  Post pain: Pain level controlled  Post assessment: Post-op Vital signs reviewed  Last Vitals:  Filed Vitals:   03/03/13 0745  BP: 103/62  Pulse: 95  Temp: 36.9 C  Resp: 18    Post vital signs: Reviewed  Level of consciousness: awake and alert   Complications: No apparent anesthesia complications

## 2013-03-03 NOTE — Anesthesia Postprocedure Evaluation (Signed)
  Anesthesia Post-op Note  Anesthesia Post Note  Patient: Kristy Mcmillan  Procedure(s) Performed: Procedure(s) (LRB): CESAREAN SECTION (N/A)  Anesthesia type: Spinal  Patient location: PACU  Post pain: Pain level controlled  Post assessment: Post-op Vital signs reviewed  Last Vitals:  Filed Vitals:   03/03/13 0015  BP: 124/76  Pulse: 89  Temp:   Resp: 12    Post vital signs: Reviewed  Level of consciousness: awake  Complications: No apparent anesthesia complications

## 2013-03-04 MED ORDER — INSULIN NPH (HUMAN) (ISOPHANE) 100 UNIT/ML ~~LOC~~ SUSP: 10.0000 [IU] | Freq: Two times a day (BID) | SUBCUTANEOUS | Status: DC

## 2013-03-04 MED ORDER — OXYCODONE-ACETAMINOPHEN 5-325 MG PO TABS: 1.0000 | ORAL_TABLET | ORAL | Status: DC | PRN

## 2013-03-04 MED ORDER — INSULIN ASPART 100 UNIT/ML ~~LOC~~ SOLN: 6.0000 [IU] | Freq: Three times a day (TID) | SUBCUTANEOUS | Status: DC

## 2013-03-04 MED ORDER — DOXYCYCLINE HYCLATE 100 MG PO TABS: 100.0000 mg | ORAL_TABLET | Freq: Two times a day (BID) | ORAL | Status: DC

## 2013-03-04 MED ORDER — IBUPROFEN 600 MG PO TABS: 600.0000 mg | ORAL_TABLET | Freq: Four times a day (QID) | ORAL | Status: DC
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
## 2013-03-04 NOTE — Progress Notes (Signed)
I spent time with family to do some grief education and follow-up grief care.  Shamari and her husband were raising appropriate spiritual and emotional concerns related to their loss.  When Hetal and I were alone for a few moments, I asked if there was anything I could do to help her--her concerns were related to grief and not about her relationship with FOB.  I provided reflective listening and care.  [employer] Pager, [phone] 12:13 PM

## 2013-03-04 NOTE — Discharge Summary (Signed)
Obstetric Discharge Summary Reason for Admission: IUFD, and repeat cesarean section Prenatal Procedures: limited PNC Intrapartum Procedures: cesarean: low cervical, transverse Postpartum Procedures: antibiotics for cellulitis of infected insect bites Complications-Operative and Postpartum: none Hemoglobin  Date Value Range Status  03/03/2013 10.2* 12.0 - 15.0 g/dL Final     DELTA CHECK NOTED     REPEATED TO VERIFY     HCT  Date Value Range Status  03/03/2013 29.5* 36.0 - 46.0 % Final    Physical Exam:  General: alert, cooperative and no distress Lochia: appropriate Uterine Fundus: firm Incision: healing well, no significant drainage, no dehiscence, no significant erythema DVT Evaluation: No evidence of DVT seen on physical exam.  Discharge Diagnoses: IUFD, RLTCS  Discharge Information: Date: 03/04/2013 Activity: pelvic rest Diet: diabetic diet Medications: NPH 10U BID, Aspart 6U with meals, doxycycline 100mg  BID x 10 days, labetalol,  Condition: stable Instructions: refer to practice specific booklet Discharge to: home Follow-up Information   Follow up with [facility] at [facility]. Schedule an appointment as soon as possible for a visit in 1 week.   Specialty:  Obstetrics and Gynecology   Contact information:   [address] [place] [address] [phone]      Follow up with [facility] at [facility]. Schedule an appointment as soon as possible for a visit in 4 weeks. (for your post partum visit)    Specialty:  Obstetrics and Gynecology   Contact information:   [address] [place] [address] [phone]      Newborn Data: Fetal demise Birth Weight: 2 lb 4.2 oz (1025 g) APGAR: 0, 0  Pt is G2P1101 with a hx of poorly controlled DM, HTN, and limited PNC  who presented at [redacted]w[redacted]d with an IUFD. Pt refused to labor and was thus taken for a repeat LTCS.  No complications intra-operatively.  Her post partum  care was uneventful. She was discharged on her dose of labetalol and NPH 10U BID wit 6U of aspart with meals. She will keep a log of her sugars and f/u at [facility] in 1 week for a f/u for her diabetes care.  Also of note, she has multiple bites on her stomach that had secondary infection for which she was placed on doxycycline x 10 days.  Pt was coping appropriately at time of discharge.  Kristy Mcmillan L 03/04/2013, 7:35 AM

## 2013-03-04 NOTE — Progress Notes (Addendum)
Inpatient Diabetes Program Recommendations  AACE/ADA: New Consensus Statement on Inpatient Glycemic Control (2013)  Target Ranges:  Prepandial:   less than 140 mg/dL      Peak postprandial:   less than 180 mg/dL (1-2 hours)      Critically ill patients:  140 - 180 mg/dL   Results for KAMERON, GLAZEBROOK (MRN [id number]) as of 03/04/2013 08:15  Ref. Range 03/03/2013 07:52 03/03/2013 11:37 03/03/2013 17:18 03/03/2013 21:56 03/04/2013 05:59 03/04/2013 07:46  Glucose-Capillary Latest Range: 70-99 mg/dL 409 (H) 811 (H) 914 (H) 274 (H) 209 (H) 246 (H)    Inpatient Diabetes Program Recommendations Insulin - Basal: Please discontinue Lantus and order Novolog 70/30 25 units BID and continue to adjust accordingly. Insulin - Meal Coverage: If 70/30 is ordered, please discontinue Novolog meal coverage.  Note: In reviewing the chart, noted that patient was not given Lantus 40 units at bedtime last night (RN documented: Not given due to allergy in chart).  Patient has allergies to several different to types of insulins.  Patient's allergy list states she has an allergy to Novolin 70/30 (insulin isophane/regular).  Therefore, would recommend ordering Novolog 70/30 (insulin aspart/ insulin protamine) 25 units BID and adjust accordingly and continue Novolog sensitive correction scale.  If Novolog 70/30 is ordered, please discontinue Novolog meal coverage.   Thanks, Orlando Penner, RN, MSN, CCRN Diabetes Coordinator Inpatient Diabetes Program [phone]    [facility] [place], [place] at 9:00 to discuss recommendations.  Wynona Canes, RN reports that the patient will be discharged and Dr. Debroah Loop will be discharging the patient on NPH and Novolog.

## 2013-03-04 NOTE — Progress Notes (Signed)
Pt discharged to home  States family  Minister will pick them up upon d/c  And get them home   Chaplain and social worker  Saw pt this am  Pt refused TDAp and pneumonia  vaccine

## 2013-03-07 ENCOUNTER — Encounter: Payer: Medicare Other | Admitting: Family Medicine

## 2013-03-07 ENCOUNTER — Telehealth: Payer: Self-pay | Admitting: *Deleted

## 2013-03-07 DIAGNOSIS — G8918 Other acute postprocedural pain: Secondary | ICD-10-CM

## 2013-03-07 LAB — GLUCOSE, CAPILLARY: Glucose-Capillary: 185 mg/dL — ABNORMAL HIGH (ref 70–99)

## 2013-03-07 MED ORDER — HYDROCODONE-ACETAMINOPHEN 7.5-300 MG PO TABS: 1.0000 | ORAL_TABLET | Freq: Four times a day (QID) | ORAL | Status: DC

## 2013-03-07 NOTE — Telephone Encounter (Signed)
Called patient to let her know we were calling in number 10 tablets of vicoden to get her through until her follow up appointment with [place] or the clinic.  Patient is to call us back to let us know when and where her next appointment is.

## 2013-07-19 ENCOUNTER — Encounter: Payer: Self-pay | Admitting: Obstetrics & Gynecology

## 2013-07-19 ENCOUNTER — Ambulatory Visit (INDEPENDENT_AMBULATORY_CARE_PROVIDER_SITE_OTHER): Payer: Medicare Other | Admitting: Obstetrics & Gynecology

## 2013-07-19 DIAGNOSIS — O200 Threatened abortion: Secondary | ICD-10-CM

## 2013-07-19 NOTE — Progress Notes (Signed)
P-118  Patient had a positive pregnancy test last week at home.  She started bleeding moderately on Sunday and has been consistent.  Bedside ultrasound shows [redacted] week gestational sack but no yolk sac or fetal pole.

## 2013-07-19 NOTE — Progress Notes (Signed)
Kristy Mcmillan was scheduled today for a NOB visit. However, she has had heavy bleeding since yesterday with some cramping. U/S here shows an empty intrauterine sac with moderate blood noted. I will draw a QBHCG today. I have given her bleeding precautions. Her blood type is O+. She will have an u/s at radiology at [facility] in 1 week.

## 2013-07-20 LAB — HCG, QUANTITATIVE, PREGNANCY

## 2013-07-25 ENCOUNTER — Ambulatory Visit (HOSPITAL_COMMUNITY)
Admission: RE | Admit: 2013-07-25 | Discharge: 2013-07-25 | Disposition: A | Discharge status: Home / Self Care | Payer: Medicare Other | Source: Ambulatory Visit | Attending: Obstetrics & Gynecology | Admitting: Obstetrics & Gynecology

## 2013-07-25 DIAGNOSIS — N938 Other specified abnormal uterine and vaginal bleeding: Secondary | ICD-10-CM | POA: Insufficient documentation

## 2013-07-25 DIAGNOSIS — N859 Noninflammatory disorder of uterus, unspecified: Secondary | ICD-10-CM | POA: Insufficient documentation

## 2013-07-25 DIAGNOSIS — O200 Threatened abortion: Secondary | ICD-10-CM

## 2013-07-25 DIAGNOSIS — N949 Unspecified condition associated with female genital organs and menstrual cycle: Secondary | ICD-10-CM | POA: Insufficient documentation

## 2013-07-26 ENCOUNTER — Telehealth: Payer: Self-pay | Admitting: *Deleted

## 2013-07-26 NOTE — Telephone Encounter (Signed)
Patient called to get test results.  Her hcg quant was less than two and the ultrasound did not show any signs of pregnancy.  Patient is reassured that her body has taken care of the miscarriage and she does not have to take any further steps for this matter.  She will call us back if there are any concerns or questions.  She has currently stopped all bleeding the day following her last appointment here.

## 2013-09-24 ENCOUNTER — Other Ambulatory Visit (HOSPITAL_COMMUNITY): Payer: Self-pay | Admitting: Obstetrics & Gynecology

## 2013-12-19 ENCOUNTER — Encounter (HOSPITAL_COMMUNITY): Payer: Self-pay

## 2014-05-01 ENCOUNTER — Encounter (HOSPITAL_COMMUNITY): Payer: Self-pay

## 2014-05-24 ENCOUNTER — Encounter (HOSPITAL_COMMUNITY): Payer: Self-pay | Admitting: *Deleted

## 2014-11-07 NOTE — H&P (Signed)
L&D Evaluation:  History:  HPI 39 year old G2 P1001 with EDC=04/16/2013 per patient hx and now 67 3/7 weeks presented with c/o lower abdominal pain and pressure. Hx is significant AMA, IDDM, CHTN, sizure disorder and prior C-section. She is receiving care at [facility] in [place], but has been noncompliant. Last seen 2 weeks ago for an ultrasound. Last felt the baby move this AM. She reports that her blood sugars were 300 at 1400 today. She took 20 U of Novolog at that time. She states she takes 20 U Novolog with each meal and a "different" insulin at hs. Her blood sugars have been in the 200-300 range. Has not gone for her NST appts. Denies VB or LOF. Hx of Csection  in 1996.   Presents with abdominal pain, pelvic pressure   Patient's Medical History Diabetes  Epilepsy  Hypertension   Patient's Surgical History Previous C-Section   Medications Pre Natal Vitamins  Insulin   Allergies PCN, Cipro, Dilantin, phenobarb, Neurontin   Social History none   Family History Non-Contributory   Exam:  Vital Signs stable  FSBS 2 hrs after insulin=185   General no acute pain   Mental Status clear   Chest clear    Heart normal sinus rhythm, no murmur/gallop/rubs   Abdomen gravid, non-tender   Fetal Position vertex on [place]   Edema no edema    Reflexes 2+    FHT No FCA seen on ultrasound, little amnoitic fluid seen   Skin multiple boils on abdomen   Impression:  Impression IUFD at 33 3/7 weeks. IDDM Poor glucose control.   Plan:  Plan Ultrasound to confirm IUFD-no FCA seen. Will transfer to [facility] for further care (IOL). Discussed case with Dr Kennon Rounds who accepts patient.   Electronic Signatures: Karene Fry (CNM)  (Signed 03-Sep-14 16:52)  Authored: L&D Evaluation   Last Updated: 03-Sep-14 16:52 by Karene Fry (CNM)

## 2018-02-18 ENCOUNTER — Other Ambulatory Visit: Payer: Self-pay

## 2018-02-18 ENCOUNTER — Encounter: Payer: Self-pay | Admitting: Emergency Medicine

## 2018-02-18 ENCOUNTER — Emergency Department
Admission: EM | Admit: 2018-02-18 | Discharge: 2018-02-18 | Discharge status: Against Medical Advice | Payer: Medicare Other | Attending: Emergency Medicine | Admitting: Emergency Medicine

## 2018-02-18 DIAGNOSIS — Z4801 Encounter for change or removal of surgical wound dressing: Secondary | ICD-10-CM | POA: Diagnosis not present

## 2018-02-18 DIAGNOSIS — Z5321 Procedure and treatment not carried out due to patient leaving prior to being seen by health care provider: Secondary | ICD-10-CM | POA: Diagnosis not present

## 2018-02-18 LAB — CBC WITH DIFFERENTIAL/PLATELET
BASOS ABS: 0.1 10*3/uL (ref 0–0.1)
BASOS PCT: 1 %
EOS ABS: 0.2 10*3/uL (ref 0–0.7)
EOS PCT: 1 %
HEMATOCRIT: 39.2 % (ref 35.0–47.0)
Hemoglobin: 13.6 g/dL (ref 12.0–16.0)
Lymphocytes Relative: 23 %
Lymphs Abs: 3.4 10*3/uL (ref 1.0–3.6)
MCH: 28.8 pg (ref 26.0–34.0)
MCHC: 34.6 g/dL (ref 32.0–36.0)
MCV: 83.3 fL (ref 80.0–100.0)
MONO ABS: 0.8 10*3/uL (ref 0.2–0.9)
MONOS PCT: 5 %
NEUTROS ABS: 10.7 10*3/uL — AB (ref 1.4–6.5)
Neutrophils Relative %: 70 %
PLATELETS: 632 10*3/uL — AB (ref 150–440)
RBC: 4.7 MIL/uL (ref 3.80–5.20)
RDW: 13.4 % (ref 11.5–14.5)
WBC: 15.2 10*3/uL — ABNORMAL HIGH (ref 3.6–11.0)

## 2018-02-18 LAB — COMPREHENSIVE METABOLIC PANEL
ALBUMIN: 3 g/dL — AB (ref 3.5–5.0)
ALK PHOS: 67 U/L (ref 38–126)
ALT: 8 U/L (ref 0–44)
ANION GAP: 11 (ref 5–15)
AST: 21 U/L (ref 15–41)
BILIRUBIN TOTAL: 0.8 mg/dL (ref 0.3–1.2)
BUN: 17 mg/dL (ref 6–20)
CALCIUM: 8.5 mg/dL — AB (ref 8.9–10.3)
CO2: 24 mmol/L (ref 22–32)
CREATININE: 0.88 mg/dL (ref 0.44–1.00)
Chloride: 99 mmol/L (ref 98–111)
GFR calc Af Amer: >60 mL/min (ref >60)
GFR calc non Af Amer: >60 mL/min (ref >60)
GLUCOSE: 380 mg/dL — AB (ref 70–99)
Potassium: 3.6 mmol/L (ref 3.5–5.1)
SODIUM: 134 mmol/L — AB (ref 135–145)
TOTAL PROTEIN: 7.3 g/dL (ref 6.5–8.1)

## 2018-02-18 NOTE — ED Notes (Signed)
Patient called to be roomed x2, no response noted at this time from patient.  Staff checked restrooms, lobby, and outdoor facility.

## 2018-02-18 NOTE — ED Notes (Signed)
Patient called to be roomed x3, no response noted at this time from patient.  Staff checked restrooms, lobby, and outdoor facility.

## 2018-02-18 NOTE — ED Notes (Signed)
Patient called to be roomed x1, no response noted at this time from patient.  Staff checked restrooms, lobby, and outdoor facility.

## 2018-02-18 NOTE — ED Triage Notes (Addendum)
Says she had bug bite that was drained on neck in [place].  Then it spread.  Now it is healing long wound and she is [place] from the beach and needs recheck.  No fever.  Wound is open but healing . Says she does feel lightheaded at times.  Has another little red spot n4ext to it that started today.

## 2018-02-19 ENCOUNTER — Telehealth: Payer: Self-pay | Admitting: Emergency Medicine

## 2018-02-19 NOTE — Telephone Encounter (Signed)
Called patient due to lwot to inquire about condition and follow up plans. Number does not work.

## 2018-03-17 ENCOUNTER — Encounter: Payer: Self-pay | Admitting: Emergency Medicine

## 2018-03-17 ENCOUNTER — Other Ambulatory Visit: Payer: Self-pay

## 2018-03-17 ENCOUNTER — Emergency Department
Admission: EM | Admit: 2018-03-17 | Discharge: 2018-03-17 | Disposition: A | Discharge status: Home / Self Care | Payer: Medicare Other | Attending: Emergency Medicine | Admitting: Emergency Medicine

## 2018-03-17 DIAGNOSIS — E1165 Type 2 diabetes mellitus with hyperglycemia: Secondary | ICD-10-CM | POA: Diagnosis present

## 2018-03-17 DIAGNOSIS — R739 Hyperglycemia, unspecified: Secondary | ICD-10-CM

## 2018-03-17 LAB — CBC
HCT: 33.9 % — ABNORMAL LOW (ref 35.0–47.0)
HEMOGLOBIN: 11.8 g/dL — AB (ref 12.0–16.0)
MCH: 28.1 pg (ref 26.0–34.0)
MCHC: 34.8 g/dL (ref 32.0–36.0)
MCV: 80.6 fL (ref 80.0–100.0)
PLATELETS: 470 10*3/uL — AB (ref 150–440)
RBC: 4.2 MIL/uL (ref 3.80–5.20)
RDW: 13.3 % (ref 11.5–14.5)
WBC: 8 10*3/uL (ref 3.6–11.0)

## 2018-03-17 LAB — GLUCOSE, CAPILLARY
GLUCOSE-CAPILLARY: 320 mg/dL — AB (ref 70–99)
GLUCOSE-CAPILLARY: 89 mg/dL (ref 70–99)

## 2018-03-17 LAB — BASIC METABOLIC PANEL
ANION GAP: 8 (ref 5–15)
BUN: 10 mg/dL (ref 6–20)
CHLORIDE: 101 mmol/L (ref 98–111)
CO2: 26 mmol/L (ref 22–32)
Calcium: 8.4 mg/dL — ABNORMAL LOW (ref 8.9–10.3)
Creatinine, Ser: 0.62 mg/dL (ref 0.44–1.00)
GFR calc non Af Amer: >60 mL/min (ref >60)
Glucose, Bld: 356 mg/dL — ABNORMAL HIGH (ref 70–99)
POTASSIUM: 4 mmol/L (ref 3.5–5.1)
SODIUM: 135 mmol/L (ref 135–145)

## 2018-03-17 MED ORDER — PREGABALIN 25 MG PO CAPS: 25.0000 mg | ORAL_CAPSULE | Freq: Three times a day (TID) | ORAL | 0 refills | Status: DC

## 2018-03-17 MED ORDER — SODIUM CHLORIDE 0.9 % IV BOLUS
1000.0000 mL | Freq: Once | INTRAVENOUS | Status: AC
  Administered 2018-03-17: 1000 mL via INTRAVENOUS

## 2018-03-17 MED ORDER — SODIUM CHLORIDE 0.9 % IV SOLN
Freq: Once | INTRAVENOUS | Status: AC
  Administered 2018-03-17: 15:00:00 via INTRAVENOUS

## 2018-03-17 MED ORDER — INSULIN ASPART 100 UNIT/ML ~~LOC~~ SOLN
5.0000 [IU] | Freq: Once | SUBCUTANEOUS | Status: AC
  Administered 2018-03-17: 5 [IU] via SUBCUTANEOUS
  Filled 2018-03-17: qty 1
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
## 2018-03-17 NOTE — ED Notes (Signed)
Alert, oriented, was in wheelchair. No distress noted.

## 2018-03-17 NOTE — ED Notes (Signed)
Pt CBG meter states 299 per pt.

## 2018-03-17 NOTE — ED Triage Notes (Signed)
PT to ED via EMS from home with c/o hyperglycemia, EMS states 438 in route. PT states she is allergic to 4 different types of insulin and in noncompliant with other insulin due to upset stomach side effects. Pt A&OX4. VSS

## 2018-03-17 NOTE — ED Triage Notes (Signed)
Pt in via EMS from home with c/o hyperglycemia. FSBS 328. Pt hx of diabetic and seizures. Pt is allergic to 4 different types of insulin and metformin.

## 2018-04-06 DIAGNOSIS — L0889 Other specified local infections of the skin and subcutaneous tissue: Secondary | ICD-10-CM | POA: Diagnosis not present

## 2018-05-12 DIAGNOSIS — L0889 Other specified local infections of the skin and subcutaneous tissue: Secondary | ICD-10-CM | POA: Diagnosis not present

## 2018-05-25 ENCOUNTER — Encounter: Payer: Self-pay | Admitting: Emergency Medicine

## 2018-05-25 ENCOUNTER — Emergency Department
Admission: EM | Admit: 2018-05-25 | Discharge: 2018-05-25 | Disposition: A | Discharge status: Home / Self Care | Payer: Medicare HMO | Attending: Emergency Medicine | Admitting: Emergency Medicine

## 2018-05-25 ENCOUNTER — Other Ambulatory Visit: Payer: Self-pay

## 2018-05-25 DIAGNOSIS — E1165 Type 2 diabetes mellitus with hyperglycemia: Secondary | ICD-10-CM | POA: Diagnosis not present

## 2018-05-25 DIAGNOSIS — M79672 Pain in left foot: Secondary | ICD-10-CM | POA: Insufficient documentation

## 2018-05-25 DIAGNOSIS — E1140 Type 2 diabetes mellitus with diabetic neuropathy, unspecified: Secondary | ICD-10-CM | POA: Diagnosis not present

## 2018-05-25 DIAGNOSIS — Z794 Long term (current) use of insulin: Secondary | ICD-10-CM | POA: Diagnosis not present

## 2018-05-25 DIAGNOSIS — G629 Polyneuropathy, unspecified: Secondary | ICD-10-CM | POA: Diagnosis not present

## 2018-05-25 DIAGNOSIS — M79671 Pain in right foot: Secondary | ICD-10-CM | POA: Diagnosis present

## 2018-05-25 LAB — GLUCOSE, CAPILLARY: Glucose-Capillary: 425 mg/dL — ABNORMAL HIGH (ref 70–99)

## 2018-05-25 MED ORDER — PREGABALIN 25 MG PO CAPS: 50.0000 mg | ORAL_CAPSULE | Freq: Two times a day (BID) | ORAL | 0 refills | Status: DC

## 2018-05-25 NOTE — Discharge Instructions (Signed)
Your exam and evaluation are overall reassuring in the emergency department today, and as we discussed I suspect that this is neuropathy.  You may increase your Lyrica dose to 50 mg twice per day (this is an increase from 25mg  three times per day).  Please see a primary care doctor for follow-up as you may need additional increased dose.  Return to the emergency room immediately for any worsening or uncontrolled pain, joint pain, joint swelling, skin rash or redness, weakness, numbness, or fever.

## 2018-05-25 NOTE — ED Triage Notes (Signed)
Patient reports neuropathic pain in both feet on and off for several weeks. States she has been tried on lyrica for it but with no improvement. Patient also states she has not checked her sugar or taken any insulin this morning. CBG in triage 425

## 2018-05-25 NOTE — ED Provider Notes (Signed)
[facility] Emergency Department Provider Note ____________________________________________   I have reviewed the triage vital signs and the triage nursing note.  HISTORY  Chief Complaint Foot Pain   Historian Patient  HPI Kristy Mcmillan is a 42 y.o. female with a history of diabetes and seizures as well as history of what sounds like neuropathy for which she is on Lyrica, presents for continued both toes tingling and painful.  It sounds like this been going on for  several weeks at least.  She does not have a primary care doctor right now, states that she wants to switch providers.  No skin rash.  No focal weakness.  No calf pain.  No swelling.  Pain at times is moderate to severe.  Sound like is little worse when she walks.  Dose of Lyrica 25 mg 3 times per day.  That she cannot take Neurontin.    Past Medical History:  Diagnosis Date  . Diabetes mellitus without complication ([place])   . Seizures ([place]) 2005 Last sz   childhood febrile sz, then ?eclampsia    Patient Active Problem List   Diagnosis Date Noted  . Morbid obesity ([place]) 07/19/2013  . IUFD (intrauterine fetal death) Apr 01, 2013  . GERD (gastroesophageal reflux disease) 12/01/2012  . Tick bite of abdomen 12/01/2012  . Previous cesarean section complicating pregnancy, antepartum condition or complication 78/46/9629  . Headache in pregnancy, antepartum 10/25/2012    Past Surgical History:  Procedure Laterality Date  . CESAREAN SECTION    . CESAREAN SECTION N/A 2013/04/01   Procedure: CESAREAN SECTION;  Surgeon: Florian Buff, MD;  Location: [facility];  Service: Obstetrics;  Laterality: N/A;  Repeat Cesarean Section Delivery Nonviable Baby Girl @ 2158    Prior to Admission medications   Medication Sig Start Date End Date Taking? Authorizing Provider  insulin aspart (NOVOLOG) 100 UNIT/ML injection Inject 6 Units into the skin 3 (three) times daily with meals. 03/04/13  Yes Beck, Keli L, MD   insulin detemir (LEVEMIR) 100 UNIT/ML injection Inject 7 Units into the skin daily.   Yes [provider]  omeprazole (PRILOSEC OTC) 20 MG tablet Take 1 tablet (20 mg total) by mouth daily. 12/01/12  Yes Donnamae Jude, MD  ibuprofen (ADVIL,MOTRIN) 600 MG tablet Take 1 tablet (600 mg total) by mouth every 6 (six) hours. 03/04/13   Kassie Mends, MD  insulin NPH (HUMULIN N) 100 UNIT/ML injection Inject 10 Units into the skin 2 (two) times daily at 8 am and 10 pm. 03/04/13   Kassie Mends, MD  labetalol (NORMODYNE) 200 MG tablet Take 1 tablet (200 mg total) by mouth 2 (two) times daily. 12/01/12   Donnamae Jude, MD  NOVOLIN N 100 UNIT/ML injection INJECT 13 UNITS INTO THE SKIN 2 (TWO) TIMES DAILY. Patient not taking: Reported on 05/25/2018    Emily Filbert, MD  oxyCODONE-acetaminophen (PERCOCET/ROXICET) 5-325 MG per tablet Take 1-2 tablets by mouth every 4 (four) hours as needed. 03/04/13   Kassie Mends, MD  pregabalin (LYRICA) 25 MG capsule Take 2 capsules (50 mg total) by mouth 2 (two) times daily. 05/25/18 05/25/19  Lisa Roca, MD    Allergies  Allergen Reactions  . Dilantin [Phenytoin Sodium Extended] Other (See Comments)    Causes seizures  . Levemir [Insulin Detemir] Other (See Comments)    Headaches and seizures  . Phenobarbital Other (See Comments)    Causes seizures  . Aspartame And Phenylalanine Diarrhea and Nausea And Vomiting  .  Ciprofloxacin Rash    blisters  . Humalog [Insulin Lispro] Rash  . Lantus [Insulin Glargine] Rash  . Novolin 70-30 [Insulin Nph Isophane & Regular] Itching and Rash  . Penicillins Itching and Rash    Family History  Problem Relation Age of Onset  . Diabetes Mother   . Diabetes Father     Social History Social History   Tobacco Use  . Smoking status: Never Smoker  . Smokeless tobacco: Never Used  Substance Use Topics  . Alcohol use: No  . Drug use: No    Review of Systems  Constitutional: Negative for fever. Eyes: Negative for visual  changes. ENT: Negative for sore throat. Cardiovascular: Negative for chest pain. Respiratory: Negative for shortness of breath. Gastrointestinal: Negative for abdominal pain, vomiting and diarrhea. Genitourinary: Negative for dysuria. Musculoskeletal: Negative for back pain. Skin: Negative for rash. Neurological: Negative for headache.  ____________________________________________   PHYSICAL EXAM:  VITAL SIGNS: ED Triage Vitals  Enc Vitals Group     BP 05/25/18 1002 132/87     Pulse Rate 05/25/18 1002 (!) 120     Resp 05/25/18 1002 15     Temp 05/25/18 1002 97.8 F (36.6 C)     Temp Source 05/25/18 1002 Oral     SpO2 05/25/18 1002 98 %     Weight 05/25/18 1005 140 lb 6 oz (63.7 kg)     Height 05/25/18 1005 5\' 2"  (1.575 m)     Head Circumference --      Peak Flow --      Pain Score 05/25/18 1011 10     Pain Loc --      Pain Edu? --      Excl. in [place]? --      Constitutional: Alert and oriented.  HEENT      Head: Normocephalic and atraumatic.      Eyes: Conjunctivae are normal. Pupils equal and round.       Ears:         Nose: No congestion/rhinnorhea.      Mouth/Throat: Mucous membranes are moist.      Neck: No stridor. Cardiovascular/Chest: Normal rate, regular rhythm.  No murmurs, rubs, or gallops. Respiratory: Normal respiratory effort without tachypnea nor retractions. Breath sounds are clear and equal bilaterally. No wheezes/rales/rhonchi. Gastrointestinal: Soft. No distention, no guarding, no rebound. Nontender.    Genitourinary/rectal:Deferred Musculoskeletal: Nontender with normal range of motion in all extremities. No joint effusions.  No edema.  A little tender on to the skin on both feet without any redness or rash or joint swelling.  No calf tenderness. Neurologic:  Normal speech and language. No gross or focal neurologic deficits are appreciated. Skin:  Skin is warm, dry and intact. No rash noted. Psychiatric: Mood and affect are normal. Speech and  behavior are normal. Patient exhibits appropriate insight and judgment.   ____________________________________________  LABS (pertinent positives/negatives) I, Lisa Roca, MD the attending physician have reviewed the labs noted below.  Labs Reviewed  GLUCOSE, CAPILLARY - Abnormal; Notable for the following components:      Result Value   Glucose-Capillary 425 (*)    All other components within normal limits    ____________________________________________    EKG I, Lisa Roca, MD, the attending physician have personally viewed and interpreted all ECGs.  None ____________________________________________  RADIOLOGY   None __________________________________________  PROCEDURES  Procedure(s) performed: None  Procedures  Critical Care performed: None   ____________________________________________  ED COURSE / ASSESSMENT AND PLAN  Pertinent labs &  imaging results that were available during my care of the patient were reviewed by me and considered in my medical decision making (see chart for details).     Clinically consistent with neuropathy.  No symptoms concerning for Ethelene Hal.  No evidence of cellulitis.  Not consistent or suspicious for arthropathy or gout.  I reviewed the Lyrica dosing, will go to twice daily dosing and increase from total of 75 mg daily to 100 mg daily, discussed with the patient that this could also go up as well.  She does need to establish new primary care doctor and she was given to office referral numbers to call.    CONSULTATIONS:   None   Patient / Family / Caregiver informed of clinical course, medical decision-making process, and agree with plan.   I discussed return precautions, follow-up instructions, and discharge instructions with patient and/or family.  Discharge Instructions : Your exam and evaluation are overall reassuring in the emergency department today, and as we discussed I suspect that this is  neuropathy.  You may increase your Lyrica dose to 50 mg twice per day (this is an increase from 25mg  three times per day).  Please see a primary care doctor for follow-up as you may need additional increased dose.  Return to the emergency room immediately for any worsening or uncontrolled pain, joint pain, joint swelling, skin rash or redness, weakness, numbness, or fever.    ___________________________________________   FINAL CLINICAL IMPRESSION(S) / ED DIAGNOSES   Final diagnoses:  Neuropathy      ___________________________________________         Note: This dictation was prepared with Dragon dictation. Any transcriptional errors that result from this process are unintentional    Lisa Roca, MD 05/25/18 1207

## 2018-06-24 ENCOUNTER — Other Ambulatory Visit: Payer: Self-pay

## 2018-06-24 ENCOUNTER — Emergency Department
Admission: EM | Admit: 2018-06-24 | Discharge: 2018-06-25 | Disposition: A | Discharge status: Home / Self Care | Payer: Medicare HMO | Attending: Emergency Medicine | Admitting: Emergency Medicine

## 2018-06-24 ENCOUNTER — Encounter: Payer: Self-pay | Admitting: Emergency Medicine

## 2018-06-24 DIAGNOSIS — R222 Localized swelling, mass and lump, trunk: Secondary | ICD-10-CM | POA: Diagnosis not present

## 2018-06-24 DIAGNOSIS — T161XXA Foreign body in right ear, initial encounter: Secondary | ICD-10-CM | POA: Insufficient documentation

## 2018-06-24 DIAGNOSIS — E119 Type 2 diabetes mellitus without complications: Secondary | ICD-10-CM | POA: Diagnosis not present

## 2018-06-24 DIAGNOSIS — Z9114 Patient's other noncompliance with medication regimen: Secondary | ICD-10-CM | POA: Diagnosis not present

## 2018-06-24 DIAGNOSIS — Y939 Activity, unspecified: Secondary | ICD-10-CM | POA: Insufficient documentation

## 2018-06-24 DIAGNOSIS — E1165 Type 2 diabetes mellitus with hyperglycemia: Secondary | ICD-10-CM | POA: Diagnosis not present

## 2018-06-24 DIAGNOSIS — X58XXXA Exposure to other specified factors, initial encounter: Secondary | ICD-10-CM | POA: Diagnosis not present

## 2018-06-24 DIAGNOSIS — M545 Low back pain, unspecified: Secondary | ICD-10-CM

## 2018-06-24 DIAGNOSIS — Y999 Unspecified external cause status: Secondary | ICD-10-CM | POA: Insufficient documentation

## 2018-06-24 DIAGNOSIS — Y929 Unspecified place or not applicable: Secondary | ICD-10-CM | POA: Insufficient documentation

## 2018-06-24 DIAGNOSIS — Z79899 Other long term (current) drug therapy: Secondary | ICD-10-CM | POA: Diagnosis not present

## 2018-06-24 DIAGNOSIS — R52 Pain, unspecified: Secondary | ICD-10-CM | POA: Diagnosis not present

## 2018-06-24 DIAGNOSIS — R609 Edema, unspecified: Secondary | ICD-10-CM | POA: Diagnosis not present

## 2018-06-24 LAB — COMPREHENSIVE METABOLIC PANEL
ALT: 10 U/L (ref 0–44)
ANION GAP: 6 (ref 5–15)
AST: 10 U/L — ABNORMAL LOW (ref 15–41)
Albumin: 2.8 g/dL — ABNORMAL LOW (ref 3.5–5.0)
Alkaline Phosphatase: 69 U/L (ref 38–126)
BUN: 20 mg/dL (ref 6–20)
CALCIUM: 8.2 mg/dL — AB (ref 8.9–10.3)
CHLORIDE: 105 mmol/L (ref 98–111)
CO2: 25 mmol/L (ref 22–32)
Creatinine, Ser: 0.66 mg/dL (ref 0.44–1.00)
Glucose, Bld: 332 mg/dL — ABNORMAL HIGH (ref 70–99)
Potassium: 3.2 mmol/L — ABNORMAL LOW (ref 3.5–5.1)
SODIUM: 136 mmol/L (ref 135–145)
Total Bilirubin: 0.5 mg/dL (ref 0.3–1.2)
Total Protein: 6.3 g/dL — ABNORMAL LOW (ref 6.5–8.1)

## 2018-06-24 LAB — URINALYSIS, COMPLETE (UACMP) WITH MICROSCOPIC
BILIRUBIN URINE: NEGATIVE
Bacteria, UA: NONE SEEN
KETONES UR: NEGATIVE mg/dL
NITRITE: NEGATIVE
PROTEIN: 100 mg/dL — AB
Specific Gravity, Urine: 1.029 (ref 1.005–1.030)
pH: 5 (ref 5.0–8.0)

## 2018-06-24 LAB — CBC
HCT: 33.5 % — ABNORMAL LOW (ref 36.0–46.0)
Hemoglobin: 11.5 g/dL — ABNORMAL LOW (ref 12.0–15.0)
MCH: 27.5 pg (ref 26.0–34.0)
MCHC: 34.3 g/dL (ref 30.0–36.0)
MCV: 80.1 fL (ref 80.0–100.0)
PLATELETS: 412 10*3/uL — AB (ref 150–400)
RBC: 4.18 MIL/uL (ref 3.87–5.11)
RDW: 12.8 % (ref 11.5–15.5)
WBC: 11.2 10*3/uL — ABNORMAL HIGH (ref 4.0–10.5)
nRBC: 0 % (ref 0.0–0.2)

## 2018-06-24 NOTE — ED Notes (Signed)
ED Provider at bedside. 

## 2018-06-24 NOTE — ED Triage Notes (Addendum)
Pt presents from home with c/o mass in upper neck and lower spine that developed today according to pt. Pt hs hx of spider bite that was drained by a hospital in [employer]. In October, pt was diagnosed at Urgent care with MRSA at the incision site. Pt prescribed antibiotics, but did not complete them. Pt reports pain in right arm as well. Blood sugar 400 for EMS, pt non-compliant with diabetes tx. 18G in left AC, 150 cc given in route to hospital.

## 2018-06-24 NOTE — ED Provider Notes (Signed)
[facility] Emergency Department Provider Note _________________________   First MD Initiated Contact with Patient 06/24/18 2313     (approximate)  I have reviewed the triage vital signs and the nursing notes.   HISTORY  Chief Complaint Mass and Arm Pain    HPI Kristy Mcmillan is a 42 y.o. female with below list of chronic medical conditions including diabetes mellitus MRSA and  cervical spine infection presents to the emergency department the EMS with multiple complaints.  Patient states that she has had acute onset of low back pain and swelling in the area of the spine which began today.  Patient states that pain is worse with movement.  Patient denies any fever no nausea or vomiting.  Of note the patient states that she had a spine infection secondary to a  recluse bite on the cervical spine 5 months ago.  Of note EMS states that the patient's glucose was 400 as patient is noncompliant with diabetic management.  In addition the patient also admits to concern for possible insect in her right ear canal as she "feels something crawling in her ear.  EMS notified us that the patient has bedbug infestation of her home with the patient confirms   Past Medical History:  Diagnosis Date  . Diabetes mellitus without complication ([place])   . Seizures ([place]) 2005 Last sz   childhood febrile sz, then ?eclampsia    Patient Active Problem List   Diagnosis Date Noted  . Morbid obesity ([place]) 07/19/2013  . IUFD (intrauterine fetal death) March 31, 2013  . GERD (gastroesophageal reflux disease) 12/01/2012  . Tick bite of abdomen 12/01/2012  . Previous cesarean section complicating pregnancy, antepartum condition or complication 30/86/5784  . Headache in pregnancy, antepartum 10/25/2012    Past Surgical History:  Procedure Laterality Date  . CESAREAN SECTION    . CESAREAN SECTION N/A Mar 31, 2013   Procedure: CESAREAN SECTION;  Surgeon: Florian Buff, MD;  Location: [facility];   Service: Obstetrics;  Laterality: N/A;  Repeat Cesarean Section Delivery Nonviable Baby Girl @ 2158    Prior to Admission medications   Medication Sig Start Date End Date Taking? Authorizing Provider  cyclobenzaprine (FLEXERIL) 10 MG tablet Take 1 tablet (10 mg total) by mouth 3 (three) times daily as needed. 06/25/18   Gregor Hams, MD  ibuprofen (ADVIL,MOTRIN) 600 MG tablet Take 1 tablet (600 mg total) by mouth every 6 (six) hours. 03/04/13   Kassie Mends, MD  insulin aspart (NOVOLOG) 100 UNIT/ML injection Inject 6 Units into the skin 3 (three) times daily with meals. 03/04/13   Kassie Mends, MD  insulin detemir (LEVEMIR) 100 UNIT/ML injection Inject 7 Units into the skin daily.    [provider]  insulin NPH (HUMULIN N) 100 UNIT/ML injection Inject 10 Units into the skin 2 (two) times daily at 8 am and 10 pm. 03/04/13   Kassie Mends, MD  labetalol (NORMODYNE) 200 MG tablet Take 1 tablet (200 mg total) by mouth 2 (two) times daily. 12/01/12   Donnamae Jude, MD  NOVOLIN N 100 UNIT/ML injection INJECT 13 UNITS INTO THE SKIN 2 (TWO) TIMES DAILY. Patient not taking: Reported on 05/25/2018    Emily Filbert, MD  omeprazole (PRILOSEC OTC) 20 MG tablet Take 1 tablet (20 mg total) by mouth daily. 12/01/12   Donnamae Jude, MD  oxyCODONE-acetaminophen (PERCOCET/ROXICET) 5-325 MG per tablet Take 1-2 tablets by mouth every 4 (four) hours as needed. 03/04/13   Olevia Bowens,  Keli L, MD  pregabalin (LYRICA) 25 MG capsule Take 2 capsules (50 mg total) by mouth 2 (two) times daily. 05/25/18 05/25/19  Lisa Roca, MD    Allergies Dilantin [phenytoin sodium extended]; Levemir [insulin detemir]; Phenobarbital; Aspartame and phenylalanine; Ciprofloxacin; Humalog [insulin lispro]; Lantus [insulin glargine]; Novolin 70-30 [insulin nph isophane & regular]; and Penicillins  Family History  Problem Relation Age of Onset  . Diabetes Mother   . Diabetes Father     Social History Social History   Tobacco Use  .  Smoking status: Never Smoker  . Smokeless tobacco: Never Used  Substance Use Topics  . Alcohol use: No  . Drug use: No    Review of Systems Constitutional: No fever/chills Eyes: No visual changes. ENT: No sore throat. Cardiovascular: Denies chest pain. Respiratory: Denies shortness of breath. Gastrointestinal: No abdominal pain.  No nausea, no vomiting.  No diarrhea.  No constipation. Genitourinary: Negative for dysuria. Musculoskeletal: Negative for neck pain.  Negative for back pain. Integumentary: Negative for rash. Neurological: Negative for headaches, focal weakness or numbness.   ____________________________________________   PHYSICAL EXAM:  VITAL SIGNS: ED Triage Vitals  Enc Vitals Group     BP 06/24/18 2300 138/89     Pulse Rate 06/24/18 2300 96     Resp 06/24/18 2303 18     Temp 06/24/18 2303 98.2 F (36.8 C)     Temp Source 06/24/18 2303 Oral     SpO2 06/24/18 2300 97 %     Weight 06/24/18 2304 64 kg (141 lb)     Height 06/24/18 2304 1.575 m (5\' 2" )     Head Circumference --      Peak Flow --      Pain Score 06/24/18 2304 10     Pain Loc --      Pain Edu? --      Excl. in [place]? --     Constitutional: Alert and oriented. Well appearing and in no acute distress. Eyes: Conjunctivae are normal.  Head: Atraumatic. Ears: Obstruction of the right external auditory canal by unknown foreign body Nose: No congestion/rhinnorhea. Mouth/Throat: Mucous membranes are moist.  Oropharynx non-erythematous. Neck: No stridor.  Cardiovascular: Normal rate, regular rhythm. Good peripheral circulation. Grossly normal heart sounds. Respiratory: Normal respiratory effort.  No retractions. Lungs CTAB. Gastrointestinal: Soft and nontender. No distention.  Musculoskeletal: No lower extremity tenderness nor edema. No gross deformities of extremities. Neurologic:  Normal speech and language. No gross focal neurologic deficits are appreciated.  Skin:  Skin is warm, dry and intact.  No rash noted. {**Psychiatric: Mood and affect are normal. Speech and behavior are normal.  ____________________________________________   LABS (all labs ordered are listed, but only abnormal results are displayed)  Labs Reviewed  URINALYSIS, COMPLETE (UACMP) WITH MICROSCOPIC - Abnormal; Notable for the following components:      Result Value   Color, Urine STRAW (*)    APPearance CLEAR (*)    Glucose, UA >=500 (*)    Hgb urine dipstick SMALL (*)    Protein, ur 100 (*)    Leukocytes, UA SMALL (*)    All other components within normal limits  CBC - Abnormal; Notable for the following components:   WBC 11.2 (*)    Hemoglobin 11.5 (*)    HCT 33.5 (*)    Platelets 412 (*)    All other components within normal limits  COMPREHENSIVE METABOLIC PANEL - Abnormal; Notable for the following components:   Potassium 3.2 (*)    Glucose,  Bld 332 (*)    Calcium 8.2 (*)    Total Protein 6.3 (*)    Albumin 2.8 (*)    AST 10 (*)    All other components within normal limits    RADIOLOGY I, [place] , personally viewed and evaluated these images (plain radiographs) as part of my medical decision making, as well as reviewing the written report by the radiologist.  ED MD interpretation:  *No acute process noted on MRI of the lumbar spine.  Official radiology report(s): Mr Lumbar Spine Wo Contrast  Result Date: 06/25/2018 CLINICAL DATA:  Increasing back mass. History of spider bite and October, status post incision and drainage with MRSA infection. EXAM: MRI LUMBAR SPINE WITHOUT CONTRAST TECHNIQUE: Multiplanar, multisequence MR imaging of the lumbar spine was performed. No intravenous contrast was administered. COMPARISON:  None. FINDINGS: SEGMENTATION: For the purposes of this report, the last well-formed intervertebral disc is reported as L5-S1. ALIGNMENT: Maintained lumbar lordosis. Grade 2 L5-S1 anterolisthesis, bilateral L5 chronic pars interarticularis defects. VERTEBRAE:Vertebral  bodies are intact. Severe L5-S1 disc height loss with disc desiccation and severe subacute on chronic discogenic endplate changes. Mild disc desiccation L4-5. No acute or abnormal bone marrow signal. CONUS MEDULLARIS AND CAUDA EQUINA: Conus medullaris terminates at L1-2 and demonstrates normal morphology and signal characteristics. Cauda equina is normal. PARASPINAL AND OTHER SOFT TISSUES: Nonacute. No focal fluid collection. DISC LEVELS: T12-L1 through L3-4: No disc bulge, canal stenosis nor neural foraminal narrowing. L4-5: Small central disc protrusion, mild facet arthropathy and ligamentum flavum redundancy without canal stenosis. Mild-to-moderate bilateral neural foraminal narrowing. L5-S1: Anterolisthesis. Unroofing of the disc. No canal stenosis. Severe bilateral neural foraminal narrowing. IMPRESSION: 1. No acute process. No mass identified though contrast enhanced imaging may be more sensitive. 2. Grade 2 L5-S1 anterolisthesis on the basis of chronic bilateral L5 pars interarticularis defects. 3. No canal stenosis. Neural foraminal narrowing L4-5 and L5-S1: Severe at L5-S1. Electronically Signed   By: Elon Alas M.D.   On: 06/25/2018 01:30    ____________________________________________   PROCEDURES    .Foreign Body Removal Date/Time: 06/24/2018 11:30 PM Performed by: Gregor Hams, MD Authorized by: Gregor Hams, MD  Consent: Verbal consent obtained. Body area: ear Location details: right ear  Sedation: Patient sedated: no  Patient restrained: no Patient cooperative: yes Localization method: ENT speculum Removal mechanism: alligator forceps 1 objects recovered. Objects recovered: insect Post-procedure assessment: foreign body removed Patient tolerance: Patient tolerated the procedure well with no immediate complications     ____________________________________________   INITIAL IMPRESSION / ASSESSMENT AND PLAN / ED COURSE  As part of my medical decision  making, I reviewed the following data within the electronic MEDICAL RECORD NUMBER   42 year old female presenting with multiple medical complaints as stated above.  Foreign body was removed from the patient's right external auditory canal.  MRI of the lumbar spine revealed no acute process.  Patient discharged home with recommendation for outpatient follow-up with PCP ____________________________________________  FINAL CLINICAL IMPRESSION(S) / ED DIAGNOSES  Final diagnoses:  Acute low back pain without sciatica, unspecified back pain laterality     MEDICATIONS GIVEN DURING THIS VISIT:  Medications  ketorolac (TORADOL) tablet 10 mg (10 mg Oral Given 06/25/18 0135)     ED Discharge Orders         Ordered    cyclobenzaprine (FLEXERIL) 10 MG tablet  3 times daily PRN,   Status:  Discontinued     06/25/18 0202    cyclobenzaprine (FLEXERIL) 10 MG tablet  3 times daily PRN     06/25/18 0233           Note:  This document was prepared using Dragon voice recognition software and may include unintentional dictation errors.    Gregor Hams, MD 06/25/18 [phone]

## 2018-06-25 ENCOUNTER — Emergency Department: Payer: Medicare HMO

## 2018-06-25 DIAGNOSIS — Z79899 Other long term (current) drug therapy: Secondary | ICD-10-CM | POA: Diagnosis not present

## 2018-06-25 DIAGNOSIS — E119 Type 2 diabetes mellitus without complications: Secondary | ICD-10-CM | POA: Diagnosis not present

## 2018-06-25 DIAGNOSIS — R222 Localized swelling, mass and lump, trunk: Secondary | ICD-10-CM | POA: Diagnosis not present

## 2018-06-25 DIAGNOSIS — X58XXXA Exposure to other specified factors, initial encounter: Secondary | ICD-10-CM | POA: Diagnosis not present

## 2018-06-25 DIAGNOSIS — T161XXA Foreign body in right ear, initial encounter: Secondary | ICD-10-CM | POA: Diagnosis not present

## 2018-06-25 DIAGNOSIS — M545 Low back pain: Secondary | ICD-10-CM | POA: Diagnosis not present

## 2018-06-25 DIAGNOSIS — Z9114 Patient's other noncompliance with medication regimen: Secondary | ICD-10-CM | POA: Diagnosis not present

## 2018-06-25 MED ORDER — CYCLOBENZAPRINE HCL 10 MG PO TABS: 10.0000 mg | ORAL_TABLET | Freq: Three times a day (TID) | ORAL | 0 refills | Status: DC | PRN

## 2018-06-25 MED ORDER — KETOROLAC TROMETHAMINE 10 MG PO TABS
10.0000 mg | ORAL_TABLET | Freq: Once | ORAL | Status: AC
  Administered 2018-06-25: 10 mg via ORAL
  Filled 2018-06-25: qty 1

## 2018-06-25 NOTE — ED Notes (Signed)
Patient transported to MRI 

## 2018-06-25 NOTE — ED Notes (Signed)
Pt given taxi voucher and ambulatory to lobby at this time.

## 2018-10-22 ENCOUNTER — Other Ambulatory Visit: Payer: Self-pay

## 2018-10-22 ENCOUNTER — Encounter: Payer: Self-pay | Admitting: Podiatry

## 2018-10-22 ENCOUNTER — Ambulatory Visit (INDEPENDENT_AMBULATORY_CARE_PROVIDER_SITE_OTHER): Payer: Medicare Other | Admitting: Podiatry

## 2018-10-22 ENCOUNTER — Ambulatory Visit (INDEPENDENT_AMBULATORY_CARE_PROVIDER_SITE_OTHER): Payer: Medicare Other

## 2018-10-22 VITALS — Temp 96.4°F

## 2018-10-22 DIAGNOSIS — M779 Enthesopathy, unspecified: Secondary | ICD-10-CM

## 2018-10-22 DIAGNOSIS — E1043 Type 1 diabetes mellitus with diabetic autonomic (poly)neuropathy: Secondary | ICD-10-CM | POA: Diagnosis not present

## 2018-10-22 DIAGNOSIS — E0865 Diabetes mellitus due to underlying condition with hyperglycemia: Secondary | ICD-10-CM

## 2018-10-22 DIAGNOSIS — M79673 Pain in unspecified foot: Secondary | ICD-10-CM | POA: Diagnosis not present

## 2018-10-22 NOTE — Progress Notes (Signed)
   HPI: 43 year old female T1DM -uncontrolled presents the office today for evaluation of ice cold sensation to her toes bilateral 1-5.  Patient has a history of uncontrolled diabetes mellitus.  She is currently being managed at [facility] for her diabetes and is currently on insulin.  Pain in the toes is been ongoing for years.  She is tried topical creams without any alleviation.  She cannot take gabapentin due to episodes of seizures secondary to the medication.  She presents for further treatment evaluation  Past Medical History:  Diagnosis Date  . Diabetes mellitus without complication ([place])   . Seizures ([place]) 2005 Last sz   childhood febrile sz, then ?eclampsia     Physical Exam: General: The patient is alert and oriented x3 in no acute distress.  Dermatology: Skin is warm, dry and supple bilateral lower extremities. Negative for open lesions or macerations.  Vascular: Palpable pedal pulses bilaterally. No edema or erythema noted. Capillary refill within normal limits.  Neurological: Epicritic and protective threshold diminished bilaterally.   Musculoskeletal Exam: Range of motion within normal limits to all pedal and ankle joints bilateral. Muscle strength 5/5 in all groups bilateral.   Radiographic Exam:  Normal osseous mineralization. Joint spaces preserved. No fracture/dislocation/boney destruction.    Assessment: 1.  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 Patient evaluated. X-Rays reviewed.  2.  I explained to the patient the best thing she can do for her feet is to get her blood glucose levels under control.  Last hemoglobin A1c was 11.0 07/2018.  3.  Patient states that she cannot take gabapentin as it induces seizures 4.  Recommend good supportive shoe gear 5.  Recommend follow-up with PCP to work on getting blood glucose levels under control 6.  Return to clinic as needed      Edrick Kins, DPM [facility]  Dr. Edrick Kins, DPM    [address], [place] [address]                Office [phone]  Fax [phone]

## 2018-11-03 ENCOUNTER — Other Ambulatory Visit (INDEPENDENT_AMBULATORY_CARE_PROVIDER_SITE_OTHER): Payer: Self-pay | Admitting: Vascular Surgery

## 2018-11-03 DIAGNOSIS — R209 Unspecified disturbances of skin sensation: Secondary | ICD-10-CM

## 2018-11-04 ENCOUNTER — Ambulatory Visit (INDEPENDENT_AMBULATORY_CARE_PROVIDER_SITE_OTHER): Payer: Medicare Other

## 2018-11-04 ENCOUNTER — Ambulatory Visit (INDEPENDENT_AMBULATORY_CARE_PROVIDER_SITE_OTHER): Payer: Medicare Other | Admitting: Vascular Surgery

## 2018-11-04 ENCOUNTER — Encounter (INDEPENDENT_AMBULATORY_CARE_PROVIDER_SITE_OTHER): Payer: Self-pay | Admitting: Vascular Surgery

## 2018-11-04 ENCOUNTER — Other Ambulatory Visit: Payer: Self-pay

## 2018-11-04 VITALS — BP 157/93 | HR 98 | Resp 10 | Ht 61.0 in | Wt 147.0 lb

## 2018-11-04 DIAGNOSIS — I739 Peripheral vascular disease, unspecified: Secondary | ICD-10-CM | POA: Diagnosis not present

## 2018-11-04 DIAGNOSIS — M25569 Pain in unspecified knee: Secondary | ICD-10-CM | POA: Insufficient documentation

## 2018-11-04 DIAGNOSIS — Z79899 Other long term (current) drug therapy: Secondary | ICD-10-CM

## 2018-11-04 DIAGNOSIS — R209 Unspecified disturbances of skin sensation: Secondary | ICD-10-CM

## 2018-11-04 DIAGNOSIS — E1340 Other specified diabetes mellitus with diabetic neuropathy, unspecified: Secondary | ICD-10-CM | POA: Diagnosis not present

## 2018-11-04 DIAGNOSIS — E1140 Type 2 diabetes mellitus with diabetic neuropathy, unspecified: Secondary | ICD-10-CM

## 2018-11-04 DIAGNOSIS — Z794 Long term (current) use of insulin: Secondary | ICD-10-CM

## 2018-11-04 DIAGNOSIS — M79604 Pain in right leg: Secondary | ICD-10-CM

## 2018-11-04 DIAGNOSIS — M79605 Pain in left leg: Secondary | ICD-10-CM | POA: Diagnosis not present

## 2018-11-04 DIAGNOSIS — K219 Gastro-esophageal reflux disease without esophagitis: Secondary | ICD-10-CM

## 2018-11-04 NOTE — Progress Notes (Signed)
MRN : [id number]  Kristy Mcmillan is a 43 y.o. ([birth year]) female who presents with chief complaint of  Chief Complaint  Patient presents with  . New Patient (Initial Visit)  .  History of Present Illness:   The patient is seen for evaluation of dysthesias of the lower extremities. Patient notes the dysthesias are variable and not always associated with activity.  The dysthesia is somewhat consistent day to day occurring on most days. The patient notes the dysthesia also occurs with standing and routinely seems worse as the day wears on. The dysthesia has been progressive over the past several years. The patient states these symptoms are causing  a profound negative impact on quality of life and daily activities.  The patient denies rest pain or dangling of an extremity off the side of the bed during the night for relief. No open wounds or sores at this time. No history of DVT or phlebitis. No prior interventions or surgeries.  There's no history of back problems and DJD of the lumbar and sacral spine.  She does have diabetes  ABI's Rt+[place] and Lt=[place] all Doppler signals are strongly triphasic  Toe tracings are strongly pulsatile  Current Meds  Medication Sig  . amitriptyline (ELAVIL) 25 MG tablet TAKE 2 TABLETS AT NIGHT, INCREASE TO 3 TABLETS AFTER 2 WEEKS IF NEEDED  . ibuprofen (ADVIL,MOTRIN) 600 MG tablet Take 1 tablet (600 mg total) by mouth every 6 (six) hours.  . insulin aspart (NOVOLOG) 100 UNIT/ML injection Inject 6 Units into the skin 3 (three) times daily with meals.  Marland Kitchen omeprazole (PRILOSEC OTC) 20 MG tablet Take 1 tablet (20 mg total) by mouth daily.  . pregabalin (LYRICA) 25 MG capsule Take 2 capsules (50 mg total) by mouth 2 (two) times daily.    Past Medical History:  Diagnosis Date  . Diabetes mellitus without complication ([place])   . Seizures ([place]) 2005 Last sz   childhood febrile sz, then ?eclampsia    Past Surgical History:  Procedure Laterality Date  .  CESAREAN SECTION    . CESAREAN SECTION N/A 03/02/2013   Procedure: CESAREAN SECTION;  Surgeon: Florian Buff, MD;  Location: [facility];  Service: Obstetrics;  Laterality: N/A;  Repeat Cesarean Section Delivery Nonviable Baby Girl @ 2158    Social History Social History   Tobacco Use  . Smoking status: Never Smoker  . Smokeless tobacco: Never Used  Substance Use Topics  . Alcohol use: No  . Drug use: No    Family History Family History  Problem Relation Age of Onset  . Diabetes Mother   . Diabetes Father   No family history of bleeding/clotting disorders, porphyria or autoimmune disease   Allergies  Allergen Reactions  . Dilantin [Phenytoin Sodium Extended] Other (See Comments)    Causes seizures  . Levemir [Insulin Detemir] Other (See Comments)    Headaches and seizures  . Phenobarbital Other (See Comments)    Causes seizures  . Aspartame And Phenylalanine Diarrhea and Nausea And Vomiting  . Carbamazepine Other (See Comments)  . Gabapentin Other (See Comments)  . Ciprofloxacin Rash    blisters  . Humalog [Insulin Lispro] Rash  . Lantus [Insulin Glargine] Rash  . Novolin 70-30 [Insulin Nph Isophane & Regular] Itching and Rash  . Penicillins Itching and Rash     REVIEW OF SYSTEMS (Negative unless checked)  Constitutional: [] Weight loss  [] Fever  [] Chills Cardiac: [] Chest pain   [] Chest pressure   [] Palpitations   [] Shortness of  breath when laying flat   [] Shortness of breath with exertion. Vascular:  [x] Pain in legs with walking   [x] Pain in legs at rest  [] History of DVT   [] Phlebitis   [] Swelling in legs   [] Varicose veins   [] Non-healing ulcers Pulmonary:   [] Uses home oxygen   [] Productive cough   [] Hemoptysis   [] Wheeze  [] COPD   [] Asthma Neurologic:  [] Dizziness   [] Seizures   [] History of stroke   [] History of TIA  [] Aphasia   [] Vissual changes   [] Weakness or numbness in arm   [x] Weakness or numbness in leg Musculoskeletal:   [] Joint swelling   [] Joint pain   [] Low  back pain Hematologic:  [] Easy bruising  [] Easy bleeding   [] Hypercoagulable state   [] Anemic Gastrointestinal:  [] Diarrhea   [] Vomiting  [] Gastroesophageal reflux/heartburn   [] Difficulty swallowing. Genitourinary:  [] Chronic kidney disease   [] Difficult urination  [] Frequent urination   [] Blood in urine Skin:  [] Rashes   [] Ulcers  Psychological:  [] History of anxiety   []  History of major depression.  Physical Examination  Vitals:   11/04/18 1108  BP: (!) 157/93  Pulse: 98  Resp: 10  Weight: 147 lb (66.7 kg)  Height: 5\' 1"  (1.549 m)   Body mass index is 27.78 kg/m. Gen: WD/WN, NAD Head: [place]/AT, No temporalis wasting.  Ear/Nose/Throat: Hearing grossly intact, nares w/o erythema or drainage, poor dentition Eyes: PER, EOMI, sclera nonicteric.  Neck: Supple, no masses.  No bruit or JVD.  Pulmonary:  Good air movement, clear to auscultation bilaterally, no use of accessory muscles.  Cardiac: RRR, normal S1, S2, no Murmurs. Vascular: left foot is cooler to touch than right  Nails are nromal Vessel Right Left  PT Trace Palpable Not Palpable  DP Palpable Palpable  Gastrointestinal: soft, non-distended. No guarding/no peritoneal signs.  Musculoskeletal: M/S 5/5 throughout.  No deformity or atrophy.  Neurologic: CN 2-12 intact. Pain and light touch intact in extremities.  Symmetrical.  Speech is fluent. Motor exam as listed above. Psychiatric: Judgment intact, Mood & affect appropriate for pt's clinical situation. Dermatologic: No rashes or ulcers noted.  No changes consistent with cellulitis. Lymph : No Cervical lymphadenopathy, no lichenification or skin changes of chronic lymphedema.  CBC Lab Results  Component Value Date   WBC 11.2 (H) 06/24/2018   HGB 11.5 (L) 06/24/2018   HCT 33.5 (L) 06/24/2018   MCV 80.1 06/24/2018   PLT 412 (H) 06/24/2018    BMET    Component Value Date/Time   NA 136 06/24/2018 2306   NA 134 (L) 10/23/2012 1807   K 3.2 (L) 06/24/2018 2306   K 4.2  10/23/2012 1807   CL 105 06/24/2018 2306   CL 103 10/23/2012 1807   CO2 25 06/24/2018 2306   CO2 23 10/23/2012 1807   GLUCOSE 332 (H) 06/24/2018 2306   GLUCOSE 238 (H) 10/23/2012 1807   BUN 20 06/24/2018 2306   BUN 8 10/23/2012 1807   CREATININE 0.66 06/24/2018 2306   CREATININE 0.59 10/25/2012 1207   CALCIUM 8.2 (L) 06/24/2018 2306   CALCIUM 8.6 10/23/2012 1807   GFRNONAA >60 06/24/2018 2306   GFRNONAA >60 10/23/2012 1807   GFRAA >60 06/24/2018 2306   GFRAA >60 10/23/2012 1807   CrCl cannot be calculated (Patient's most recent lab result is older than the maximum 21 days allowed.).  COAG No results found for: INR, PROTIME  Radiology No results found.   Assessment/Plan 1. Leg pain, bilateral Recommend:  I do not find evidence of  Vascular pathology that would explain the patient's symptoms  The patient has atypical pain symptoms for vascular disease  Noninvasive studies including venous ultrasound of the legs do not identify vascular problems  The patient should continue walking and begin a more formal exercise program. The patient should continue his antiplatelet therapy and aggressive treatment of the lipid abnormalities. The patient should begin wearing graduated compression socks 15-20 mmHg strength to control her mild edema.  Patient will follow-up with me on a PRN basis  Further work-up of her lower extremity pain is deferred to the primary service     2. Neuropathy due to secondary diabetes mellitus ([place]) See #1 and 2  3. PAD (peripheral artery disease) ([facility]) Recommend:  I do not find evidence of life style limiting vascular disease. The patient specifically denies life style limitation.  Previous noninvasive studies including ABI's of the legs do not identify critical vascular problems.  ABI's Rt+[place] and Lt=[place] all Doppler signals are strongly triphasic  Toe tracings are strongly pulsatile  The patient should continue walking and begin a more formal  exercise program. The patient should continue his antiplatelet therapy and aggressive treatment of the lipid abnormalities.  The patient should begin wearing graduated compression socks 15-20 mmHg strength to control her mild edema.  Patient will follow-up with me on a PRN basis   4. Gastroesophageal reflux disease without esophagitis Continue PPI as already ordered, this medication has been reviewed and there are no changes at this time.  Avoidence of caffeine and alcohol  Moderate elevation of the head of the bed     Hortencia Pilar, MD  11/04/2018 12:57 PM

## 2018-11-17 DIAGNOSIS — R209 Unspecified disturbances of skin sensation: Secondary | ICD-10-CM | POA: Insufficient documentation

## 2018-11-29 ENCOUNTER — Other Ambulatory Visit: Payer: Self-pay | Admitting: Podiatry

## 2018-11-29 DIAGNOSIS — E1043 Type 1 diabetes mellitus with diabetic autonomic (poly)neuropathy: Secondary | ICD-10-CM

## 2019-01-28 DIAGNOSIS — E559 Vitamin D deficiency, unspecified: Secondary | ICD-10-CM | POA: Insufficient documentation

## 2019-01-28 DIAGNOSIS — E538 Deficiency of other specified B group vitamins: Secondary | ICD-10-CM | POA: Insufficient documentation

## 2019-02-22 NOTE — Progress Notes (Signed)
Patient's Name: Kristy Mcmillan  MRN: [id number]  Referring Provider: Anabel Bene, MD  DOB: Oct 31, 1975  [facility] [place]  DOS: 02/23/2019  Note by: Gillis Santa, MD  Service setting: Ambulatory outpatient  Specialty: Interventional Pain Management  Location: [facility] (AMB) Pain Management Facility  Visit type: Initial Patient Evaluation  Patient type: New Patient   Primary Reason(s) for Visit: Encounter for initial evaluation of one or more chronic problems (new to examiner) potentially causing chronic pain, and posing a threat to normal musculoskeletal function. (Level of risk: High) CC: Toe Pain (bilateral)  HPI  Kristy Mcmillan is a 43 y.o. year old, female patient, who comes today to see [place] for the first time for an initial evaluation of her chronic pain. She has Previous cesarean section complicating pregnancy, antepartum condition or complication; Headache in pregnancy, antepartum; GERD (gastroesophageal reflux disease); Tick bite of abdomen; IUFD (intrauterine fetal death); Morbid obesity ([place]); DM (diabetes mellitus), type 1 ([place]); Seizure disorder ([place]); Neuropathy due to secondary diabetes mellitus ([place]); Pain in joint, lower leg; and PAD (peripheral artery disease) (HCC) on their problem list. Today she comes in for evaluation of her Toe Pain (bilateral)  Pain Assessment: Location: Right, Left Toe (Comment which one) Radiating: denies Onset: More than a month ago Duration: Chronic pain Quality: Pins and needles Severity: 10-Worst pain ever/10 (subjective, self-reported pain score)  Note: Reported level is inconsistent with clinical observations.                         When using our objective Pain Scale, levels between 6 and 10/10 are said to belong in an emergency room, as it progressively worsens from a 6/10, described as severely limiting, requiring emergency care not usually available at an outpatient pain management facility. At a 6/10 level, communication becomes  difficult and requires great effort. Assistance to reach the emergency department may be required. Facial flushing and profuse sweating along with potentially dangerous increases in heart rate and blood pressure will be evident. Effect on ADL: "It bothers me trying to walk" Timing: Constant Modifying factors: denies BP: 137/81  HR: 100  Onset and Duration: Gradual and Present longer than 3 months Cause of pain: Unknown Severity: Getting worse, NAS-11 at its worse: 10/10, NAS-11 at its best: 10/10, NAS-11 now: 10/10 and NAS-11 on the average: 10/10 Timing: Not influenced by the time of the day Aggravating Factors: Bending, Climbing, Kneeling, Lifiting, Motion, Prolonged sitting, Prolonged standing, Squatting, Stooping , Twisting, Walking, Walking uphill, Walking downhill and Working Alleviating Factors: it hurts all the time Associated Problems: Numbness, Temperature changes, Tingling, Pain that wakes patient up and Pain that does not allow patient to sleep Quality of Pain: Aching, Constant, Tingling and Uncomfortable Previous Examinations or Tests: The patient denies . Patient states she has been to [facility] Previous Treatments: The patient denies .  Patient states she has tried antidepressants, gabapentin and Lyrica  The patient comes into the clinics today for the first time for a chronic pain management evaluation.   Patient is a 43 year old female with a history of idiopathic lower extremity neuropathy.  She has pain in bilateral toes.  She notes discoloration of some of her toes.  She is a diabetic and is on insulin.  She describes her pain as cold, sharp, tingling and has associated numbness as well.  Patient has tried gabapentin which caused seizures along with Lyrica, Cymbalta, amitriptyline, nortriptyline in the past.  When the patient  came in today, she states that she has only tried "depression medications" for her pain.  I informed her that these are evidence-based medicines  for neuropathic pain and while they are also used as antidepressants at higher doses such as Cymbalta, amitriptyline, nortriptyline they are used for neuropathic pain at lower doses.  Patient is a poor historian and was not very forthcoming about her pain history.  The patient was somewhat dismissive when I informed her that we can try alpha lipoic acid which can be effective for neuropathic pain.  I was very clear with the patient that opioid therapy would not be a treatment option nor is indicated for her pain condition.  Furthermore agonism of the delta opioid receptors and display pro convulsant properties which I informed the patient.  She does have a history of seizure disorder.  Of note patient has been seen by vascular surgery and does not have any evidence of vascular pathology that could explain her symptoms.  Venous ultrasound of the legs was negative.  Historic Controlled Substance Pharmacotherapy Review   05/25/2018  2   05/25/2018  Pregabalin 25 MG Capsule  30.00 7 Re Lor   [id number]   [place] ([id number])   0  0.72 LME  Medicare   [place]    Undesirable effects: Side-effects or Adverse reactions: None reported Historical Monitoring: The patient  reports no history of drug use. List of all UDS Test(s): Lab Results  Component Value Date   COCAINSCRNUR NONE DETECTED 11/05/2012   THCU NONE DETECTED 11/05/2012   List of other Serum/Urine Drug Screening Test(s):  Lab Results  Component Value Date   COCAINSCRNUR NONE DETECTED 11/05/2012   THCU NONE DETECTED 11/05/2012   Historical Background Evaluation: [place] PMP: PDMP not reviewed this encounter. Six (6) year initial data search conducted.             [place] Department of public safety, offender search: Editor, commissioning Information) Non-contributory Risk Assessment Profile: Aberrant behavior: None observed or detected today Risk factors for fatal opioid overdose: None identified today Fatal overdose hazard ratio (HR): Calculation deferred Non-fatal overdose  hazard ratio (HR): Calculation deferred Risk of opioid abuse or dependence: 0.7-3.0% with doses ? 36 MME/day and 6.1-26% with doses ? 120 MME/day. Substance use disorder (SUD) risk level: See below Personal History of Substance Abuse (SUD-Substance use disorder):  Alcohol: Negative  Illegal Drugs: Negative  Rx Drugs: Negative  ORT Risk Level calculation: Low Risk Opioid Risk Tool - 02/23/19 1321      Family History of Substance Abuse   Alcohol  Positive Female    Illegal Drugs  Negative    Rx Drugs  Negative      Personal History of Substance Abuse   Alcohol  Negative    Illegal Drugs  Negative    Rx Drugs  Negative      Age   Age between 61-45 years   No      History of Preadolescent Sexual Abuse   History of Preadolescent Sexual Abuse  Negative or Female      Psychological Disease   Psychological Disease  Negative    Depression  Negative      Total Score   Opioid Risk Tool Scoring  1    Opioid Risk Interpretation  Low Risk      ORT Scoring interpretation table:  Score <3 = Low Risk for SUD  Score between 4-7 = Moderate Risk for SUD  Score >8 = High Risk for Opioid Abuse   PHQ-2  Depression Scale:  Total score: 0  PHQ-2 Scoring interpretation table: (Score and probability of major depressive disorder)  Score 0 = No depression  Score 1 = 15.4% Probability  Score 2 = 21.1% Probability  Score 3 = 38.4% Probability  Score 4 = 45.5% Probability  Score 5 = 56.4% Probability  Score 6 = 78.6% Probability   PHQ-9 Depression Scale:  Total score: 0  PHQ-9 Scoring interpretation table:  Score 0-4 = No depression  Score 5-9 = Mild depression  Score 10-14 = Moderate depression  Score 15-19 = Moderately severe depression  Score 20-27 = Severe depression (2.4 times higher risk of SUD and 2.89 times higher risk of overuse)   Pharmacologic Plan: Non-opioid analgesic therapy offered.            Initial impression: Poor candidate for opioid analgesics.  Meds   Current  Outpatient Medications:  .  amitriptyline (ELAVIL) 25 MG tablet, TAKE 2 TABLETS AT NIGHT, INCREASE TO 3 TABLETS AFTER 2 WEEKS IF NEEDED, Disp: , Rfl:  .  calcium-vitamin D (OSCAL WITH D) 500-200 MG-UNIT tablet, Take 1 tablet by mouth., Disp: , Rfl:  .  cholecalciferol (VITAMIN D) 25 MCG (1000 UT) tablet, Take 1,000 Units by mouth daily., Disp: , Rfl:  .  insulin aspart (NOVOLOG) 100 UNIT/ML injection, Inject 6 Units into the skin 3 (three) times daily with meals., Disp: 1 vial, Rfl: 12 .  linagliptin (TRADJENTA) 5 MG TABS tablet, Take 5 mg by mouth daily., Disp: , Rfl:  .  lisinopril (ZESTRIL) 2.5 MG tablet, Take 2.5 mg by mouth daily., Disp: , Rfl:  .  vitamin B-12 (CYANOCOBALAMIN) 500 MCG tablet, Take 500 mcg by mouth daily., Disp: , Rfl:  .  Alpha-Lipoic Acid 600 MG CAPS, Take 1 capsule (600 mg total) by mouth daily., Disp: 30 capsule, Rfl: 2  Imaging Review  Lumbosacral Imaging: Lumbar MR wo contrast:  Results for orders placed during the hospital encounter of 06/24/18  MR LUMBAR SPINE WO CONTRAST   Narrative CLINICAL DATA:  Increasing back mass. History of spider bite and October, status post incision and drainage with MRSA infection.  EXAM: MRI LUMBAR SPINE WITHOUT CONTRAST  TECHNIQUE: Multiplanar, multisequence MR imaging of the lumbar spine was performed. No intravenous contrast was administered.  COMPARISON:  None.  FINDINGS: SEGMENTATION: For the purposes of this report, the last well-formed intervertebral disc is reported as L5-S1.  ALIGNMENT: Maintained lumbar lordosis. Grade 2 L5-S1 anterolisthesis, bilateral L5 chronic pars interarticularis defects.  VERTEBRAE:Vertebral bodies are intact. Severe L5-S1 disc height loss with disc desiccation and severe subacute on chronic discogenic endplate changes. Mild disc desiccation L4-5. No acute or abnormal bone marrow signal.  CONUS MEDULLARIS AND CAUDA EQUINA: Conus medullaris terminates at L1-2 and demonstrates normal  morphology and signal characteristics. Cauda equina is normal.  PARASPINAL AND OTHER SOFT TISSUES: Nonacute. No focal fluid collection.  DISC LEVELS:  T12-L1 through L3-4: No disc bulge, canal stenosis nor neural foraminal narrowing.  L4-5: Small central disc protrusion, mild facet arthropathy and ligamentum flavum redundancy without canal stenosis. Mild-to-moderate bilateral neural foraminal narrowing.  L5-S1: Anterolisthesis. Unroofing of the disc. No canal stenosis. Severe bilateral neural foraminal narrowing.  IMPRESSION: 1. No acute process. No mass identified though contrast enhanced imaging may be more sensitive. 2. Grade 2 L5-S1 anterolisthesis on the basis of chronic bilateral L5 pars interarticularis defects. 3. No canal stenosis. Neural foraminal narrowing L4-5 and L5-S1: Severe at L5-S1.   Electronically Signed   By: Elon Alas  M.D.   On: 06/25/2018 01:30    Complexity Note: Imaging results reviewed. Results shared with Kristy Mcmillan, using Layman's terms.                         ROS  Cardiovascular: No reported cardiovascular signs or symptoms such as High blood pressure, coronary artery disease, abnormal heart rate or rhythm, heart attack, blood thinner therapy or heart weakness and/or failure Pulmonary or Respiratory: No reported pulmonary signs or symptoms such as wheezing and difficulty taking a deep full breath (Asthma), difficulty blowing air out (Emphysema), coughing up mucus (Bronchitis), persistent dry cough, or temporary stoppage of breathing during sleep Neurological: Seizure disorder Review of Past Neurological Studies: No results found for this or any previous visit. Psychological-Psychiatric: No reported psychological or psychiatric signs or symptoms such as difficulty sleeping, anxiety, depression, delusions or hallucinations (schizophrenial), mood swings (bipolar disorders) or suicidal ideations or attempts Gastrointestinal: No reported  gastrointestinal signs or symptoms such as vomiting or evacuating blood, reflux, heartburn, alternating episodes of diarrhea and constipation, inflamed or scarred liver, or pancreas or irrregular and/or infrequent bowel movements Genitourinary: No reported renal or genitourinary signs or symptoms such as difficulty voiding or producing urine, peeing blood, non-functioning kidney, kidney stones, difficulty emptying the bladder, difficulty controlling the flow of urine, or chronic kidney disease Hematological: No reported hematological signs or symptoms such as prolonged bleeding, low or poor functioning platelets, bruising or bleeding easily, hereditary bleeding problems, low energy levels due to low hemoglobin or being anemic Endocrine: High blood sugar requiring insulin (IDDM) Rheumatologic: No reported rheumatological signs and symptoms such as fatigue, joint pain, tenderness, swelling, redness, heat, stiffness, decreased range of motion, with or without associated rash Musculoskeletal: Negative for myasthenia gravis, muscular dystrophy, multiple sclerosis or malignant hyperthermia Work History: Disabled  Allergies  Kristy Mcmillan is allergic to dilantin [phenytoin sodium extended]; levemir [insulin detemir]; phenobarbital; phenytoin sodium extended; aspartame; aspartame and phenylalanine; carbamazepine; gabapentin; ciprofloxacin; humalog [insulin lispro]; lantus [insulin glargine]; novolin 70-30 [insulin nph isophane & regular]; and penicillins.  Laboratory Chemistry Profile   Screening Lab Results  Component Value Date   HIV NON REACTIVE 10/25/2012   PREGTESTUR POSITIVE (A) 08/19/2012     Renal Lab Results  Component Value Date   BUN 20 06/24/2018   CREATININE 0.66 06/24/2018   GFRAA >60 06/24/2018   GFRNONAA >60 06/24/2018                             Hepatic Lab Results  Component Value Date   AST 10 (L) 06/24/2018   ALT 10 06/24/2018   ALBUMIN 2.8 (L) 06/24/2018   ALKPHOS 69  06/24/2018   LIPASE 132 10/23/2012                        Electrolytes Lab Results  Component Value Date   NA 136 06/24/2018   K 3.2 (L) 06/24/2018   CL 105 06/24/2018   CALCIUM 8.2 (L) 06/24/2018                        Neuropathy Lab Results  Component Value Date   HGBA1C 9.2 (H) 03/03/2013   HIV NON REACTIVE 10/25/2012                        CNS No results found for: COLORCSF, APPEARCSF, RBCCOUNTCSF, WBCCSF, POLYSCSF,  LYMPHSCSF, EOSCSF, PROTEINCSF, GLUCCSF, JCVIRUS, CSFOLI, IGGCSF, LABACHR, ACETBL                      Bone No results found for: VD25OH, [id number], [id number], [id number], 25OHVITD1, 25OHVITD2, 25OHVITD3, TESTOFREE, TESTOSTERONE                       Coagulation Lab Results  Component Value Date   PLT 412 (H) 06/24/2018                        Cardiovascular Lab Results  Component Value Date   HGB 11.5 (L) 06/24/2018   HCT 33.5 (L) 06/24/2018                         ID Lab Results  Component Value Date   HIV NON REACTIVE 10/25/2012   PREGTESTUR POSITIVE (A) 08/19/2012    Cancer No results found for: CEA, CA125, LABCA2                      Endocrine Lab Results  Component Value Date   TSH 1.385 10/25/2012                        Note: Lab results reviewed.  PFSH  Drug: Kristy Mcmillan  reports no history of drug use. Alcohol:  reports no history of alcohol use. Tobacco:  reports that she has never smoked. She has never used smokeless tobacco. Medical:  has a past medical history of Diabetes mellitus without complication ([place]) and Seizures ([place]) (2005 Last sz). Family: family history includes Diabetes in her father and mother.  Past Surgical History:  Procedure Laterality Date  . CESAREAN SECTION    . CESAREAN SECTION N/A 03/15/2013   Procedure: CESAREAN SECTION;  Surgeon: Florian Buff, MD;  Location: [facility];  Service: Obstetrics;  Laterality: N/A;  Repeat Cesarean Section Delivery Nonviable Baby Girl @ 2158   Active Ambulatory Problems     Diagnosis Date Noted  . Previous cesarean section complicating pregnancy, antepartum condition or complication [id number]  . Headache in pregnancy, antepartum 10/25/2012  . GERD (gastroesophageal reflux disease) 12/01/2012  . Tick bite of abdomen 12/01/2012  . IUFD (intrauterine fetal death) 15-Mar-2013  . Morbid obesity ([place]) 07/19/2013  . DM (diabetes mellitus), type 1 ([place]) 09/22/2012  . Seizure disorder ([place]) 09/22/2012  . Neuropathy due to secondary diabetes mellitus ([place]) 11/04/2018  . Pain in joint, lower leg 11/04/2018  . PAD (peripheral artery disease) ([place]) 11/04/2018   Resolved Ambulatory Problems    Diagnosis Date Noted  . Preexisting diabetes complicating pregnancy, antepartum 10/25/2012  . Chronic hypertension complicating pregnancy, antepartum 10/25/2012  . Advanced maternal age, antepartum 10/25/2012   Past Medical History:  Diagnosis Date  . Diabetes mellitus without complication ([place])   . Seizures ([place]) 2005 Last sz   Constitutional Exam  General appearance: Well nourished, well developed, and well hydrated. In no apparent acute distress Vitals:   02/23/19 1310  BP: 137/81  Pulse: 100  Resp: 16  Temp: 97.9 F (36.6 C)  SpO2: 100%  Weight: 167 lb (75.8 kg)  Height: 5\' 4"  (1.626 m)   BMI Assessment: Estimated body mass index is 28.67 kg/m as calculated from the following:   Height as of this encounter: 5\' 4"  (1.626 m).   Weight as of this encounter: 167 lb (75.8 kg).  BMI  interpretation table: BMI level Category Range association with higher incidence of chronic pain  <18 kg/m2 Underweight   18.5-24.9 kg/m2 Ideal body weight   25-29.9 kg/m2 Overweight Increased incidence by 20%  30-34.9 kg/m2 Obese (Class I) Increased incidence by 68%  35-39.9 kg/m2 Severe obesity (Class II) Increased incidence by 136%  >40 kg/m2 Extreme obesity (Class III) Increased incidence by 254%   Patient's current BMI Ideal Body weight  Body mass index is 28.67 kg/m. Ideal  body weight: 54.7 kg (120 lb 9.5 oz) Adjusted ideal body weight: 63.1 kg (139 lb 2.5 oz)   BMI Readings from Last 4 Encounters:  02/23/19 28.67 kg/m  11/04/18 27.78 kg/m  06/24/18 25.79 kg/m  05/25/18 25.67 kg/m   Wt Readings from Last 4 Encounters:  02/23/19 167 lb (75.8 kg)  11/04/18 147 lb (66.7 kg)  06/24/18 141 lb (64 kg)  05/25/18 140 lb 6 oz (63.7 kg)  Psych/Mental status: Alert, oriented x 3 (person, place, & time)       Eyes: PERLA Respiratory: No evidence of acute respiratory distress  Cervical Spine Area Exam  Skin & Axial Inspection: No masses, redness, edema, swelling, or associated skin lesions Alignment: Symmetrical Functional ROM: Unrestricted ROM      Stability: No instability detected Muscle Tone/Strength: Functionally intact. No obvious neuro-muscular anomalies detected. Sensory (Neurological): Unimpaired Palpation: No palpable anomalies              Upper Extremity (UE) Exam    Side: Right upper extremity  Side: Left upper extremity  Skin & Extremity Inspection: Skin color, temperature, and hair growth are WNL. No peripheral edema or cyanosis. No masses, redness, swelling, asymmetry, or associated skin lesions. No contractures.  Skin & Extremity Inspection: Skin color, temperature, and hair growth are WNL. No peripheral edema or cyanosis. No masses, redness, swelling, asymmetry, or associated skin lesions. No contractures.  Functional ROM: Unrestricted ROM          Functional ROM: Unrestricted ROM          Muscle Tone/Strength: Functionally intact. No obvious neuro-muscular anomalies detected.  Muscle Tone/Strength: Functionally intact. No obvious neuro-muscular anomalies detected.  Sensory (Neurological): Unimpaired          Sensory (Neurological): Unimpaired          Palpation: No palpable anomalies              Palpation: No palpable anomalies              Provocative Test(s):  Phalen's test: deferred Tinel's test: deferred Apley's scratch test (touch  opposite shoulder):  Action 1 (Across chest): deferred Action 2 (Overhead): deferred Action 3 (LB reach): deferred   Provocative Test(s):  Phalen's test: deferred Tinel's test: deferred Apley's scratch test (touch opposite shoulder):  Action 1 (Across chest): deferred Action 2 (Overhead): deferred Action 3 (LB reach): deferred    Thoracic Spine Area Exam  Skin & Axial Inspection: No masses, redness, or swelling Alignment: Symmetrical Functional ROM: Unrestricted ROM Stability: No instability detected Muscle Tone/Strength: Functionally intact. No obvious neuro-muscular anomalies detected. Sensory (Neurological): Unimpaired Muscle strength & Tone: No palpable anomalies  Lumbar Spine Area Exam  Skin & Axial Inspection: No masses, redness, or swelling Alignment: Symmetrical Functional ROM: Unrestricted ROM       Stability: No instability detected Muscle Tone/Strength: Functionally intact. No obvious neuro-muscular anomalies detected. Sensory (Neurological): Unimpaired Palpation: No palpable anomalies       Provocative Tests: Hyperextension/rotation test: deferred today  Lumbar quadrant test (Kemp's test): deferred today       Lateral bending test: deferred today       Patrick's Maneuver: deferred today                   FABER* test: deferred today                   S-I anterior distraction/compression test: deferred today         S-I lateral compression test: deferred today         S-I Thigh-thrust test: deferred today         S-I Gaenslen's test: deferred today         *(Flexion, ABduction and External Rotation)  Gait & Posture Assessment  Ambulation: Unassisted Gait: Relatively normal for age and body habitus Posture: WNL   Lower Extremity Exam    Side: Right lower extremity  Side: Left lower extremity  Stability: No instability observed          Stability: No instability observed          Skin & Extremity Inspection: Positive color changes  Skin & Extremity  Inspection: Positive color changes  Functional ROM: Unrestricted ROM                  Functional ROM: Unrestricted ROM                  Muscle Tone/Strength: Functionally intact. No obvious neuro-muscular anomalies detected.  Muscle Tone/Strength: Functionally intact. No obvious neuro-muscular anomalies detected.  Sensory (Neurological): Neuropathic pain pattern        Sensory (Neurological): Neuropathic pain pattern        DTR: Patellar: deferred today Achilles: deferred today Plantar: deferred today  DTR: Patellar: deferred today Achilles: deferred today Plantar: deferred today  Palpation: No palpable anomalies  Palpation: No palpable anomalies   Assessment  Primary Diagnosis & Pertinent Problem List: The primary encounter diagnosis was Diabetic polyneuropathy associated with type 2 diabetes mellitus ([place]). Diagnoses of Neuropathic pain and Bilateral foot pain were also pertinent to this visit.  Visit Diagnosis (New problems to examiner): 1. Diabetic polyneuropathy associated with type 2 diabetes mellitus ([place])   2. Neuropathic pain   3. Bilateral foot pain   I had extensive discussion with the patient about the goals of pain management.  We discussed nonpharmacological approaches to pain management that include physical therapy, dieting, sleep hygiene, psychotherapy, interventional therapy.  We discussed the importance of understanding the type of pain including neuropathic, nociceptive, centralized.  I also stressed the importance of multimodal analgesia with an emphasis on nondrug modalities including self management, behavioral health support and physical therapy.  We discussed the importance of physical therapy and how a individualized physical therapy and occupational therapy program tailored to patient limitations can be helpful at improving physical function. We also discussed the importance of insomnia and disrupted sleep and how improved sleep hygiene and cognitive therapy could be  helpful.  Psychotherapy including CBT, mind-body therapies, pain coping strategies can be helpful for patients whose pain impacts mood, sleep, quality of life, relationships with others.  We discussed avoiding benzodiazepines.  I also had an extensive discussion with the patient about interventional therapies which is my expertise and how these could be incorporated into an effective multimodal pain management plan.  Patient is a 43 year old female with a history of diabetic lower extremity neuropathy.  She has pain in bilateral toes.  She notes discoloration of some of  her toes.  She is a diabetic and is on insulin.  She describes her pain as cold, sharp, tingling and has associated numbness as well.  Patient has tried gabapentin which caused seizures along with Lyrica, Cymbalta, amitriptyline, nortriptyline in the past.  When the patient came in today, she states that she has only tried "depression medications" for her pain.  I informed her that these are evidence-based medicines for neuropathic pain and while they are also used as antidepressants at higher doses such as Cymbalta, amitriptyline, nortriptyline they are used for neuropathic pain at lower doses.  Patient is a poor historian and was not very forthcoming about her pain history.  The patient was somewhat dismissive when I informed her that we can try alpha lipoic acid which can be effective for neuropathic pain.  I was very clear with the patient that opioid therapy would not be a treatment option nor is indicated for her pain condition.  Furthermore agonism of the delta opioid receptors and display pro convulsant properties which I informed the patient.  She does have a history of seizure disorder.  Of note patient has been seen by vascular surgery and does not have any evidence of vascular pathology that could explain her symptoms.  Venous ultrasound of the legs was negative.  In regards to treatment plan, patient has tried and failed various  medication classes including NSAIDs: Ibuprofen, Aleve; muscle relaxants: Flexeril; as well as neuropathic medications including gabapentin, Lyrica, Cymbalta, amitriptyline, nortriptyline.  Vascular work-up was negative.  Lumbar MRI is unremarkable for any neuroforaminal or canal stenosis that could be resulting in her pain.  At this point, the patient's neuropathic pain is likely related to diabetic neuropathy.  I informed the patient that the best treatment for her bilateral foot pain will be to get her blood sugars under better control.  In the meantime she can try alpha lipoic acid, Rx below.  Do not recommend chronic opioid therapy for this patient.  No interventional procedures to offer this patient to help manage her pain.  Patient to follow-up with PCP.   Pharmacotherapy (current): Medications ordered:  Meds ordered this encounter  Medications  . Alpha-Lipoic Acid 600 MG CAPS    Sig: Take 1 capsule (600 mg total) by mouth daily.    Dispense:  30 capsule    Refill:  2    Do not place medication on "Automatic Refill". Fill one day early if pharmacy is closed on scheduled refill date.   Medications administered during this visit: Kristy Mcmillan had no medications administered during this visit.     Provider-requested follow-up: No follow-ups on file.  Primary Care Physician: [facility] [place] Location: [facility] Note by: Gillis Santa, MD Date: 02/23/2019; Time: 1:48 PM  Note: This dictation was prepared with Dragon dictation. Any transcriptional errors that may result from this process are unintentional.

## 2019-02-23 ENCOUNTER — Other Ambulatory Visit: Payer: Self-pay

## 2019-02-23 ENCOUNTER — Encounter: Payer: Self-pay | Admitting: Student in an Organized Health Care Education/Training Program

## 2019-02-23 ENCOUNTER — Ambulatory Visit
Payer: Medicare Other | Attending: Student in an Organized Health Care Education/Training Program | Admitting: Student in an Organized Health Care Education/Training Program

## 2019-02-23 VITALS — BP 137/81 | HR 100 | Temp 97.9°F | Resp 16 | Ht 64.0 in | Wt 167.0 lb

## 2019-02-23 DIAGNOSIS — M792 Neuralgia and neuritis, unspecified: Secondary | ICD-10-CM | POA: Diagnosis present

## 2019-02-23 DIAGNOSIS — E1142 Type 2 diabetes mellitus with diabetic polyneuropathy: Secondary | ICD-10-CM | POA: Diagnosis not present

## 2019-02-23 DIAGNOSIS — M79671 Pain in right foot: Secondary | ICD-10-CM | POA: Insufficient documentation

## 2019-02-23 DIAGNOSIS — M79672 Pain in left foot: Secondary | ICD-10-CM | POA: Insufficient documentation

## 2019-02-23 MED ORDER — ALPHA-LIPOIC ACID 600 MG PO CAPS: 600.0000 mg | ORAL_CAPSULE | Freq: Every day | ORAL | 2 refills | Status: AC

## 2019-02-23 NOTE — Progress Notes (Signed)
Safety precautions to be maintained throughout the outpatient stay will include: orient to surroundings, keep bed in low position, maintain call bell within reach at all times, provide assistance with transfer out of bed and ambulation.  

## 2019-02-23 NOTE — Patient Instructions (Signed)
Follow- up with PCP   Alpha lipoic acid prescribed

## 2019-02-24 DIAGNOSIS — G5793 Unspecified mononeuropathy of bilateral lower limbs: Secondary | ICD-10-CM | POA: Insufficient documentation

## 2019-03-28 DIAGNOSIS — M545 Low back pain, unspecified: Secondary | ICD-10-CM | POA: Insufficient documentation

## 2019-03-28 DIAGNOSIS — M79671 Pain in right foot: Secondary | ICD-10-CM | POA: Insufficient documentation

## 2019-03-28 DIAGNOSIS — R202 Paresthesia of skin: Secondary | ICD-10-CM | POA: Insufficient documentation

## 2019-03-28 DIAGNOSIS — R200 Anesthesia of skin: Secondary | ICD-10-CM | POA: Insufficient documentation

## 2019-07-31 ENCOUNTER — Emergency Department
Admission: EM | Admit: 2019-07-31 | Discharge: 2019-07-31 | Disposition: A | Discharge status: Home / Self Care | Payer: Medicare Other | Attending: Emergency Medicine | Admitting: Emergency Medicine

## 2019-07-31 ENCOUNTER — Emergency Department: Payer: Medicare Other

## 2019-07-31 ENCOUNTER — Other Ambulatory Visit: Payer: Self-pay

## 2019-07-31 DIAGNOSIS — R079 Chest pain, unspecified: Secondary | ICD-10-CM | POA: Diagnosis present

## 2019-07-31 DIAGNOSIS — E1040 Type 1 diabetes mellitus with diabetic neuropathy, unspecified: Secondary | ICD-10-CM | POA: Insufficient documentation

## 2019-07-31 DIAGNOSIS — R0789 Other chest pain: Secondary | ICD-10-CM | POA: Insufficient documentation

## 2019-07-31 DIAGNOSIS — Z7984 Long term (current) use of oral hypoglycemic drugs: Secondary | ICD-10-CM | POA: Insufficient documentation

## 2019-07-31 DIAGNOSIS — Z79899 Other long term (current) drug therapy: Secondary | ICD-10-CM | POA: Insufficient documentation

## 2019-07-31 LAB — TROPONIN I (HIGH SENSITIVITY)
Troponin I (High Sensitivity): <2 ng/L (ref <18)
Troponin I (High Sensitivity): 3 ng/L (ref <18)

## 2019-07-31 LAB — BASIC METABOLIC PANEL
Anion gap: 10 (ref 5–15)
BUN: 26 mg/dL — ABNORMAL HIGH (ref 6–20)
CO2: 23 mmol/L (ref 22–32)
Calcium: 8.9 mg/dL (ref 8.9–10.3)
Chloride: 103 mmol/L (ref 98–111)
Creatinine, Ser: 0.9 mg/dL (ref 0.44–1.00)
GFR calc Af Amer: >60 mL/min (ref >60)
GFR calc non Af Amer: >60 mL/min (ref >60)
Glucose, Bld: 211 mg/dL — ABNORMAL HIGH (ref 70–99)
Potassium: 4.4 mmol/L (ref 3.5–5.1)
Sodium: 136 mmol/L (ref 135–145)

## 2019-07-31 LAB — CBC
HCT: 37 % (ref 36.0–46.0)
Hemoglobin: 12.4 g/dL (ref 12.0–15.0)
MCH: 27.6 pg (ref 26.0–34.0)
MCHC: 33.5 g/dL (ref 30.0–36.0)
MCV: 82.2 fL (ref 80.0–100.0)
Platelets: 398 10*3/uL (ref 150–400)
RBC: 4.5 MIL/uL (ref 3.87–5.11)
RDW: 12.5 % (ref 11.5–15.5)
WBC: 10.8 10*3/uL — ABNORMAL HIGH (ref 4.0–10.5)
nRBC: 0 % (ref 0.0–0.2)

## 2019-07-31 NOTE — Discharge Instructions (Signed)
Please follow-up with your primary care provider this coming week.  Take Tylenol or ibuprofen if needed for pain.  Return to the emergency department for symptoms that change or worsen if unable to schedule an appointment.

## 2019-07-31 NOTE — ED Provider Notes (Signed)
[facility] Emergency Department Provider Note  ____________________________________________   None    (approximate)  I have reviewed the triage vital signs and the nursing notes.   HISTORY  Chief Complaint Chest Pain   HPI Kristy Mcmillan is a 44 y.o. female presents to the emergency department for treatment and evaluation of right side shoulder  and chest pain that radiates down into her right arm.  She states that she also has neuropathy in bilateral feet that are hurting worse today.  She reports that she is compliant with her medications.  She was at rest when the pain started.  Past Medical History:  Diagnosis Date  . Diabetes mellitus without complication ([place])   . Seizures ([place]) 2005 Last sz   childhood febrile sz, then ?eclampsia    Patient Active Problem List   Diagnosis Date Noted  . Neuropathy due to secondary diabetes mellitus ([place]) 11/04/2018  . Pain in joint, lower leg 11/04/2018  . PAD (peripheral artery disease) ([place]) 11/04/2018  . Morbid obesity ([place]) 07/19/2013  . IUFD (intrauterine fetal death) 03-08-13  . GERD (gastroesophageal reflux disease) 12/01/2012  . Tick bite of abdomen 12/01/2012  . Previous cesarean section complicating pregnancy, antepartum condition or complication [id number]  . Headache in pregnancy, antepartum 10/25/2012  . DM (diabetes mellitus), type 1 ([place]) 09/22/2012  . Seizure disorder ([place]) 09/22/2012    Past Surgical History:  Procedure Laterality Date  . CESAREAN SECTION    . CESAREAN SECTION N/A March 08, 2013   Procedure: CESAREAN SECTION;  Surgeon: Florian Buff, MD;  Location: [facility];  Service: Obstetrics;  Laterality: N/A;  Repeat Cesarean Section Delivery Nonviable Baby Girl @ 2158    Prior to Admission medications   Medication Sig Start Date End Date Taking? Authorizing Provider  amitriptyline (ELAVIL) 25 MG tablet TAKE 2 TABLETS AT NIGHT, INCREASE TO 3 TABLETS AFTER 2 WEEKS IF NEEDED 09/20/18    [provider]  calcium-vitamin D (OSCAL WITH D) 500-200 MG-UNIT tablet Take 1 tablet by mouth.    [provider]  cholecalciferol (VITAMIN D) 25 MCG (1000 UT) tablet Take 1,000 Units by mouth daily.    [provider]  insulin aspart (NOVOLOG) 100 UNIT/ML injection Inject 6 Units into the skin 3 (three) times daily with meals. 03/04/13   Kassie Mends, MD  linagliptin (TRADJENTA) 5 MG TABS tablet Take 5 mg by mouth daily.    [provider]  lisinopril (ZESTRIL) 2.5 MG tablet Take 2.5 mg by mouth daily.    [provider]  vitamin B-12 (CYANOCOBALAMIN) 500 MCG tablet Take 500 mcg by mouth daily.    [provider]    Allergies Dilantin [phenytoin sodium extended], Levemir [insulin detemir], Phenobarbital, Phenytoin sodium extended, Aspartame, Aspartame and phenylalanine, Carbamazepine, Gabapentin, Ciprofloxacin, Humalog [insulin lispro], Lantus [insulin glargine], Novolin 70-30 [insulin nph isophane & regular], and Penicillins  Family History  Problem Relation Age of Onset  . Diabetes Mother   . Diabetes Father     Social History Social History   Tobacco Use  . Smoking status: Never Smoker  . Smokeless tobacco: Never Used  Substance Use Topics  . Alcohol use: No  . Drug use: No    Review of Systems  Constitutional: No fever/chills. Eyes: No visual changes. ENT: No sore throat. Cardiovascular: Positive for chest pain. Negative for pleuritic pain. Negative for palpitations. Negative for leg pain. Respiratory: Negative shortness of breath. Gastrointestinal: No abdominal pain. no nausea, no vomiting.  No diarrhea.  No constipation. Genitourinary: Negative for dysuria. Musculoskeletal: Negative for back pain.  Skin: Negative for rash, lesion, wound. Neurological: Negative for headaches, focal weakness or numbness. ___________________________________________   PHYSICAL EXAM:  VITAL SIGNS: ED Triage Vitals  Enc Vitals  Group     BP --      Pulse --      Resp --      Temp --      Temp src --      SpO2 --      Weight 07/31/19 1402 169 lb (76.7 kg)     Height 07/31/19 1402 5\' 3"  (1.6 m)     Head Circumference --      Peak Flow --      Pain Score 07/31/19 1401 10     Pain Loc --      Pain Edu? --      Excl. in [place]? --     Constitutional: Alert and oriented. Well appearing and in no acute distress. Eyes: Conjunctivae are normal. PERRL. Head: Atraumatic. Nose: No congestion/rhinnorhea. Mouth/Throat: Mucous membranes are moist.  Oropharynx non-erythematous. Tongue normal in size and color. Neck: No stridor. No carotid bruit appreciated on exam. Hematological/Lymphatic/Immunilogical: No cervical lymphadenopathy. Cardiovascular: Normal rate, regular rhythm. Grossly normal heart sounds.  Good peripheral circulation. Respiratory: Normal respiratory effort.  No retractions. Lungs CTAB. Gastrointestinal: Soft and nontender. No distention. No abdominal bruits. No CVA tenderness. Genitourinary: Exam deferred. Musculoskeletal: No lower extremity tenderness. No edema of extremities. Neurologic:  Normal speech and language. No gross focal neurologic deficits are appreciated. Skin:  Skin is warm, dry and intact. No rash noted. Psychiatric: Mood and affect are normal. Speech and behavior are normal.  ____________________________________________   LABS (all labs ordered are listed, but only abnormal results are displayed)  Labs Reviewed  BASIC METABOLIC PANEL - Abnormal; Notable for the following components:      Result Value   Glucose, Bld 211 (*)    BUN 26 (*)    All other components within normal limits  CBC - Abnormal; Notable for the following components:   WBC 10.8 (*)    All other components within normal limits  TROPONIN I (HIGH SENSITIVITY)  TROPONIN I (HIGH SENSITIVITY)   ____________________________________________  EKG  ED ECG REPORT I, Jenney Brester, FNP-BC personally viewed and  interpreted this ECG.   Date: 07/31/2019  EKG Time: 1408  Rate: 106  Rhythm: sinus tachycardia  Axis: normal  Intervals:none  ST&T Change: no ST elevation  ____________________________________________  RADIOLOGY  Low lung volumes or acute cardiopulmonary abnormality.  ED MD interpretation:    I, Sherrie George, personally viewed and evaluated these images (plain radiographs) as part of my medical decision making, as well as reviewing the written report by the radiologist.  Official radiology report(s): No results found.  ____________________________________________   PROCEDURES  Procedure(s) performed: None  Procedures  Critical Care performed: No  ____________________________________________   INITIAL IMPRESSION / ASSESSMENT AND PLAN / ED COURSE  44 year old female presenting to the emergency department for acute onset right side shoulder, and anterior chest pain.  Pain does not worsen with deep breath or movement.  She has had this pain in the past.  She is also experiencing worsening bilateral lower extremity neuropathy.  She denies fever, cough, or any other symptoms of concern.  See HPI for further details.  Plan will be to get a chest x-ray and draw some labs.  Exam is reassuring. ____________________________________________  Differential diagnosis includes, but not limited to:  Cardiac event, pulmonary embolus, musculoskeletal pain,   FINAL CLINICAL IMPRESSION(S) / ED DIAGNOSES  Serial cardiac enzymes are reassuring.  CBC and BMP are unremarkable.  All appears to be at or near patient's baseline.  Patient is requesting discharge.  She states that her pain is gone and she does not want to be in the emergency department any longer.  She was advised to follow-up with her primary care provider this week.  She was advised to take Tylenol or ibuprofen if needed for pain.  If her symptoms change or worsen if she is unable to schedule appointment, she was encouraged to  return to the emergency department.  Final diagnoses:  Atypical chest pain     ED Discharge Orders    None       Maeleigh D Easterday was evaluated in Emergency Department on 08/01/2019 for the symptoms described in the history of present illness. She was evaluated in the context of the global COVID-19 pandemic, which necessitated consideration that the patient might be at risk for infection with the SARS-CoV-2 virus that causes COVID-19. Institutional protocols and algorithms that pertain to the evaluation of patients at risk for COVID-19 are in a state of rapid change based on information released by regulatory bodies including the CDC and federal and state organizations. These policies and algorithms were followed during the patient's care in the ED.   Note:  This document was prepared using Dragon voice recognition software and may include unintentional dictation errors.   Victorino Dike, FNP 08/01/19 [place], MD 08/01/19 [phone]

## 2019-07-31 NOTE — ED Notes (Signed)
Pt transported to xray 

## 2019-07-31 NOTE — ED Triage Notes (Signed)
Pt arrived via EMS for report of right sided chest pain that radiates down right arm - reports neuropathy in bilat feet and are hurting today - reports that she has seizures and is on no medication (last had seizure 2005) - pt reports that every 15 years she has seizure

## 2020-01-25 DIAGNOSIS — H539 Unspecified visual disturbance: Secondary | ICD-10-CM | POA: Insufficient documentation

## 2020-02-19 ENCOUNTER — Emergency Department: Payer: Medicare Other

## 2020-02-19 ENCOUNTER — Emergency Department
Admission: EM | Admit: 2020-02-19 | Discharge: 2020-02-20 | Disposition: A | Discharge status: Home / Self Care | Payer: Medicare Other | Attending: Emergency Medicine | Admitting: Emergency Medicine

## 2020-02-19 ENCOUNTER — Other Ambulatory Visit: Payer: Self-pay

## 2020-02-19 DIAGNOSIS — Y999 Unspecified external cause status: Secondary | ICD-10-CM | POA: Insufficient documentation

## 2020-02-19 DIAGNOSIS — Y9389 Activity, other specified: Secondary | ICD-10-CM | POA: Insufficient documentation

## 2020-02-19 DIAGNOSIS — Y92009 Unspecified place in unspecified non-institutional (private) residence as the place of occurrence of the external cause: Secondary | ICD-10-CM | POA: Diagnosis not present

## 2020-02-19 DIAGNOSIS — Z20822 Contact with and (suspected) exposure to covid-19: Secondary | ICD-10-CM | POA: Diagnosis not present

## 2020-02-19 DIAGNOSIS — E1140 Type 2 diabetes mellitus with diabetic neuropathy, unspecified: Secondary | ICD-10-CM | POA: Diagnosis not present

## 2020-02-19 DIAGNOSIS — F17200 Nicotine dependence, unspecified, uncomplicated: Secondary | ICD-10-CM | POA: Insufficient documentation

## 2020-02-19 DIAGNOSIS — S0993XA Unspecified injury of face, initial encounter: Secondary | ICD-10-CM | POA: Diagnosis present

## 2020-02-19 DIAGNOSIS — S0181XA Laceration without foreign body of other part of head, initial encounter: Secondary | ICD-10-CM | POA: Diagnosis not present

## 2020-02-19 DIAGNOSIS — Z794 Long term (current) use of insulin: Secondary | ICD-10-CM | POA: Insufficient documentation

## 2020-02-19 DIAGNOSIS — T07XXXA Unspecified multiple injuries, initial encounter: Secondary | ICD-10-CM

## 2020-02-19 DIAGNOSIS — Z79899 Other long term (current) drug therapy: Secondary | ICD-10-CM | POA: Diagnosis not present

## 2020-02-19 LAB — COMPREHENSIVE METABOLIC PANEL
ALT: 12 U/L (ref 0–44)
AST: 15 U/L (ref 15–41)
Albumin: 3.6 g/dL (ref 3.5–5.0)
Alkaline Phosphatase: 63 U/L (ref 38–126)
Anion gap: 12 (ref 5–15)
BUN: 24 mg/dL — ABNORMAL HIGH (ref 6–20)
CO2: 24 mmol/L (ref 22–32)
Calcium: 8.9 mg/dL (ref 8.9–10.3)
Chloride: 104 mmol/L (ref 98–111)
Creatinine, Ser: 0.88 mg/dL (ref 0.44–1.00)
GFR calc Af Amer: >60 mL/min (ref >60)
GFR calc non Af Amer: >60 mL/min (ref >60)
Glucose, Bld: 276 mg/dL — ABNORMAL HIGH (ref 70–99)
Potassium: 4.1 mmol/L (ref 3.5–5.1)
Sodium: 140 mmol/L (ref 135–145)
Total Bilirubin: 0.7 mg/dL (ref 0.3–1.2)
Total Protein: 7.5 g/dL (ref 6.5–8.1)

## 2020-02-19 LAB — CBC
HCT: 39.1 % (ref 36.0–46.0)
Hemoglobin: 13.3 g/dL (ref 12.0–15.0)
MCH: 28.2 pg (ref 26.0–34.0)
MCHC: 34 g/dL (ref 30.0–36.0)
MCV: 83 fL (ref 80.0–100.0)
Platelets: 416 10*3/uL — ABNORMAL HIGH (ref 150–400)
RBC: 4.71 MIL/uL (ref 3.87–5.11)
RDW: 12.1 % (ref 11.5–15.5)
WBC: 13.5 10*3/uL — ABNORMAL HIGH (ref 4.0–10.5)
nRBC: 0 % (ref 0.0–0.2)

## 2020-02-19 LAB — GLUCOSE, CAPILLARY: Glucose-Capillary: 242 mg/dL — ABNORMAL HIGH (ref 70–99)

## 2020-02-19 LAB — ETHANOL: Alcohol, Ethyl (B): <10 mg/dL (ref <10)

## 2020-02-19 LAB — SALICYLATE LEVEL: Salicylate Lvl: <7 mg/dL — ABNORMAL LOW (ref 7.0–30.0)

## 2020-02-19 LAB — SARS CORONAVIRUS 2 BY RT PCR (HOSPITAL ORDER, PERFORMED IN ~~LOC~~ HOSPITAL LAB): SARS Coronavirus 2: NEGATIVE

## 2020-02-19 LAB — ACETAMINOPHEN LEVEL: Acetaminophen (Tylenol), Serum: <10 ug/mL — ABNORMAL LOW (ref 10–30)

## 2020-02-19 MED ORDER — INSULIN DETEMIR 100 UNIT/ML ~~LOC~~ SOLN
10.0000 [IU] | Freq: Every day | SUBCUTANEOUS | Status: DC
  Filled 2020-02-19: qty 0.1

## 2020-02-19 MED ORDER — LINAGLIPTIN 5 MG PO TABS: 5.0000 mg | ORAL_TABLET | Freq: Every day | ORAL | Status: DC

## 2020-02-19 MED ORDER — DULOXETINE HCL 30 MG PO CPEP
30.0000 mg | ORAL_CAPSULE | Freq: Two times a day (BID) | ORAL | Status: DC
  Filled 2020-02-19 (×2): qty 1

## 2020-02-19 MED ORDER — LISINOPRIL 5 MG PO TABS: 2.5000 mg | ORAL_TABLET | Freq: Every day | ORAL | Status: DC

## 2020-02-19 MED ORDER — ZONISAMIDE 100 MG PO CAPS: 100.0000 mg | ORAL_CAPSULE | Freq: Every day | ORAL | Status: DC

## 2020-02-19 MED ORDER — INSULIN ASPART 100 UNIT/ML ~~LOC~~ SOLN: 6.0000 [IU] | Freq: Three times a day (TID) | SUBCUTANEOUS | Status: DC
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
## 2020-02-19 NOTE — ED Notes (Signed)
Hourly rounding reveals patient sleeping in room. No complaints, stable, in no acute distress. Q15 minute rounds and monitoring via Rover and Officer to continue.  
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
## 2020-02-19 NOTE — ED Notes (Signed)
Pt placed in hospital scrubs by this RN and Clifton Custard. Belongings placed in pt belongings bag and labeled. Belongings bag given to primary nurse.

## 2020-02-19 NOTE — ED Notes (Signed)
Pt. Transferred from Triage to 20 hall after dressing out and screening for contraband. Report to include Situation, Background, Assessment and Recommendations from [facility]. Pt. Oriented to Quad including Q15 minute rounds as well as Engineer, drilling for their protection. Patient is alert and oriented, warm and dry in no acute distress. Patient denies SI, HI, and AVH. Pt. Encouraged to let me know if needs arise.

## 2020-02-20 LAB — GLUCOSE, CAPILLARY: Glucose-Capillary: 222 mg/dL — ABNORMAL HIGH (ref 70–99)

## 2020-02-20 NOTE — ED Notes (Signed)
Dr. Owens Shark and this Nurse in to see patient, reference her refusal to take any of the prescribed medications including Levamir for her elevated blood sugar. Patient refused to "take her medications" multiple times.

## 2020-02-20 NOTE — ED Notes (Signed)
Hourly rounding reveals patient sleeping in room. No complaints, stable, in no acute distress. Q15 minute rounds and monitoring via Rover and Officer to continue.  

## 2020-02-20 NOTE — ED Notes (Addendum)
Hourly rounding reveals patient awake in room. Stable, in no acute distress. Q15 minute rounds and monitoring via Rover and Officer to continue.  

## 2020-03-08 ENCOUNTER — Emergency Department: Payer: Medicare Other

## 2020-03-08 ENCOUNTER — Other Ambulatory Visit: Payer: Self-pay

## 2020-03-08 DIAGNOSIS — E1165 Type 2 diabetes mellitus with hyperglycemia: Secondary | ICD-10-CM | POA: Diagnosis present

## 2020-03-08 DIAGNOSIS — G40909 Epilepsy, unspecified, not intractable, without status epilepticus: Secondary | ICD-10-CM | POA: Diagnosis present

## 2020-03-08 DIAGNOSIS — T8131XA Disruption of external operation (surgical) wound, not elsewhere classified, initial encounter: Secondary | ICD-10-CM | POA: Diagnosis not present

## 2020-03-08 DIAGNOSIS — E663 Overweight: Secondary | ICD-10-CM | POA: Diagnosis present

## 2020-03-08 DIAGNOSIS — B9562 Methicillin resistant Staphylococcus aureus infection as the cause of diseases classified elsewhere: Secondary | ICD-10-CM | POA: Diagnosis present

## 2020-03-08 DIAGNOSIS — N179 Acute kidney failure, unspecified: Secondary | ICD-10-CM | POA: Diagnosis present

## 2020-03-08 DIAGNOSIS — L02611 Cutaneous abscess of right foot: Secondary | ICD-10-CM | POA: Diagnosis present

## 2020-03-08 DIAGNOSIS — Z833 Family history of diabetes mellitus: Secondary | ICD-10-CM

## 2020-03-08 DIAGNOSIS — E113599 Type 2 diabetes mellitus with proliferative diabetic retinopathy without macular edema, unspecified eye: Secondary | ICD-10-CM | POA: Diagnosis present

## 2020-03-08 DIAGNOSIS — E872 Acidosis: Secondary | ICD-10-CM | POA: Diagnosis present

## 2020-03-08 DIAGNOSIS — R4781 Slurred speech: Secondary | ICD-10-CM | POA: Diagnosis not present

## 2020-03-08 DIAGNOSIS — G9340 Encephalopathy, unspecified: Secondary | ICD-10-CM | POA: Diagnosis not present

## 2020-03-08 DIAGNOSIS — Z79899 Other long term (current) drug therapy: Secondary | ICD-10-CM

## 2020-03-08 DIAGNOSIS — J189 Pneumonia, unspecified organism: Secondary | ICD-10-CM | POA: Diagnosis not present

## 2020-03-08 DIAGNOSIS — E785 Hyperlipidemia, unspecified: Secondary | ICD-10-CM | POA: Diagnosis present

## 2020-03-08 DIAGNOSIS — J9601 Acute respiratory failure with hypoxia: Secondary | ICD-10-CM | POA: Diagnosis not present

## 2020-03-08 DIAGNOSIS — A419 Sepsis, unspecified organism: Principal | ICD-10-CM | POA: Diagnosis present

## 2020-03-08 DIAGNOSIS — E876 Hypokalemia: Secondary | ICD-10-CM | POA: Diagnosis not present

## 2020-03-08 DIAGNOSIS — M869 Osteomyelitis, unspecified: Secondary | ICD-10-CM | POA: Diagnosis present

## 2020-03-08 DIAGNOSIS — K219 Gastro-esophageal reflux disease without esophagitis: Secondary | ICD-10-CM | POA: Diagnosis present

## 2020-03-08 DIAGNOSIS — Z20822 Contact with and (suspected) exposure to covid-19: Secondary | ICD-10-CM | POA: Diagnosis present

## 2020-03-08 DIAGNOSIS — E1152 Type 2 diabetes mellitus with diabetic peripheral angiopathy with gangrene: Secondary | ICD-10-CM | POA: Diagnosis present

## 2020-03-08 DIAGNOSIS — D509 Iron deficiency anemia, unspecified: Secondary | ICD-10-CM | POA: Diagnosis present

## 2020-03-08 DIAGNOSIS — F17200 Nicotine dependence, unspecified, uncomplicated: Secondary | ICD-10-CM | POA: Diagnosis present

## 2020-03-08 DIAGNOSIS — E11621 Type 2 diabetes mellitus with foot ulcer: Secondary | ICD-10-CM | POA: Diagnosis present

## 2020-03-08 DIAGNOSIS — Z6828 Body mass index (BMI) 28.0-28.9, adult: Secondary | ICD-10-CM

## 2020-03-08 DIAGNOSIS — L03115 Cellulitis of right lower limb: Secondary | ICD-10-CM | POA: Diagnosis present

## 2020-03-08 DIAGNOSIS — E878 Other disorders of electrolyte and fluid balance, not elsewhere classified: Secondary | ICD-10-CM | POA: Diagnosis present

## 2020-03-08 DIAGNOSIS — Z794 Long term (current) use of insulin: Secondary | ICD-10-CM

## 2020-03-08 DIAGNOSIS — E871 Hypo-osmolality and hyponatremia: Secondary | ICD-10-CM | POA: Diagnosis present

## 2020-03-08 DIAGNOSIS — I10 Essential (primary) hypertension: Secondary | ICD-10-CM | POA: Diagnosis present

## 2020-03-08 DIAGNOSIS — E1142 Type 2 diabetes mellitus with diabetic polyneuropathy: Secondary | ICD-10-CM | POA: Diagnosis present

## 2020-03-08 DIAGNOSIS — M009 Pyogenic arthritis, unspecified: Secondary | ICD-10-CM | POA: Diagnosis present

## 2020-03-08 DIAGNOSIS — M609 Myositis, unspecified: Secondary | ICD-10-CM | POA: Diagnosis present

## 2020-03-08 DIAGNOSIS — E1169 Type 2 diabetes mellitus with other specified complication: Secondary | ICD-10-CM | POA: Diagnosis present

## 2020-03-08 DIAGNOSIS — E11649 Type 2 diabetes mellitus with hypoglycemia without coma: Secondary | ICD-10-CM | POA: Diagnosis not present

## 2020-03-08 DIAGNOSIS — L97518 Non-pressure chronic ulcer of other part of right foot with other specified severity: Secondary | ICD-10-CM | POA: Diagnosis present

## 2020-03-08 LAB — COMPREHENSIVE METABOLIC PANEL
ALT: 10 U/L (ref 0–44)
AST: 11 U/L — ABNORMAL LOW (ref 15–41)
Albumin: 2.4 g/dL — ABNORMAL LOW (ref 3.5–5.0)
Alkaline Phosphatase: 122 U/L (ref 38–126)
Anion gap: 12 (ref 5–15)
BUN: 18 mg/dL (ref 6–20)
CO2: 25 mmol/L (ref 22–32)
Calcium: 8.5 mg/dL — ABNORMAL LOW (ref 8.9–10.3)
Chloride: 93 mmol/L — ABNORMAL LOW (ref 98–111)
Creatinine, Ser: 1.17 mg/dL — ABNORMAL HIGH (ref 0.44–1.00)
GFR calc Af Amer: >60 mL/min (ref >60)
GFR calc non Af Amer: 57 mL/min — ABNORMAL LOW (ref >60)
Glucose, Bld: 428 mg/dL — ABNORMAL HIGH (ref 70–99)
Potassium: 3.5 mmol/L (ref 3.5–5.1)
Sodium: 130 mmol/L — ABNORMAL LOW (ref 135–145)
Total Bilirubin: 0.7 mg/dL (ref 0.3–1.2)
Total Protein: 8.4 g/dL — ABNORMAL HIGH (ref 6.5–8.1)

## 2020-03-08 LAB — CBC WITH DIFFERENTIAL/PLATELET
Abs Immature Granulocytes: 0.38 10*3/uL — ABNORMAL HIGH (ref 0.00–0.07)
Basophils Absolute: 0.1 10*3/uL (ref 0.0–0.1)
Basophils Relative: 0 %
Eosinophils Absolute: 0.1 10*3/uL (ref 0.0–0.5)
Eosinophils Relative: 1 %
HCT: 30.8 % — ABNORMAL LOW (ref 36.0–46.0)
Hemoglobin: 10.5 g/dL — ABNORMAL LOW (ref 12.0–15.0)
Immature Granulocytes: 1 %
Lymphocytes Relative: 7 %
Lymphs Abs: 1.9 10*3/uL (ref 0.7–4.0)
MCH: 27.9 pg (ref 26.0–34.0)
MCHC: 34.1 g/dL (ref 30.0–36.0)
MCV: 81.9 fL (ref 80.0–100.0)
Monocytes Absolute: 1.2 10*3/uL — ABNORMAL HIGH (ref 0.1–1.0)
Monocytes Relative: 5 %
Neutro Abs: 22.9 10*3/uL — ABNORMAL HIGH (ref 1.7–7.7)
Neutrophils Relative %: 86 %
Platelets: 578 10*3/uL — ABNORMAL HIGH (ref 150–400)
RBC: 3.76 MIL/uL — ABNORMAL LOW (ref 3.87–5.11)
RDW: 12.4 % (ref 11.5–15.5)
WBC: 26.8 10*3/uL — ABNORMAL HIGH (ref 4.0–10.5)
nRBC: 0 % (ref 0.0–0.2)

## 2020-03-08 LAB — LACTIC ACID, PLASMA: Lactic Acid, Venous: 1.3 mmol/L (ref 0.5–1.9)

## 2020-03-08 MED ORDER — ACETAMINOPHEN 500 MG PO TABS
1000.0000 mg | ORAL_TABLET | Freq: Once | ORAL | Status: AC
  Administered 2020-03-08: 1000 mg via ORAL
  Filled 2020-03-08: qty 2

## 2020-03-08 NOTE — ED Notes (Signed)
x1 failed attempt to collect blood by this nurse

## 2020-03-08 NOTE — ED Notes (Signed)
#  1 set of BC collected by Wille Glaser, RN

## 2020-03-08 NOTE — ED Triage Notes (Signed)
Pt comes from home with [facility] due to foot pain, swelling and possible necrosis of the right small toe. Pt also complains of potential infection of the mouth. Pt states that she has experienced fever at home and treated with tylenol and ibuprofen with no relief. Pt right foot is red, swollen and warm to touch. Pt is alert, oriented x 4.

## 2020-03-09 ENCOUNTER — Inpatient Hospital Stay: Payer: Medicare Other

## 2020-03-09 ENCOUNTER — Inpatient Hospital Stay
Admission: EM | Admit: 2020-03-09 | Discharge: 2020-03-17 | DRG: 853 | Disposition: A | Discharge status: Home Health | Payer: Medicare Other | Attending: Internal Medicine | Admitting: Internal Medicine

## 2020-03-09 DIAGNOSIS — E1165 Type 2 diabetes mellitus with hyperglycemia: Secondary | ICD-10-CM | POA: Diagnosis present

## 2020-03-09 DIAGNOSIS — E11649 Type 2 diabetes mellitus with hypoglycemia without coma: Secondary | ICD-10-CM | POA: Diagnosis not present

## 2020-03-09 DIAGNOSIS — L03115 Cellulitis of right lower limb: Secondary | ICD-10-CM

## 2020-03-09 DIAGNOSIS — A419 Sepsis, unspecified organism: Secondary | ICD-10-CM | POA: Diagnosis present

## 2020-03-09 DIAGNOSIS — N179 Acute kidney failure, unspecified: Secondary | ICD-10-CM | POA: Diagnosis present

## 2020-03-09 DIAGNOSIS — G9340 Encephalopathy, unspecified: Secondary | ICD-10-CM | POA: Diagnosis not present

## 2020-03-09 DIAGNOSIS — D696 Thrombocytopenia, unspecified: Secondary | ICD-10-CM | POA: Diagnosis not present

## 2020-03-09 DIAGNOSIS — L97518 Non-pressure chronic ulcer of other part of right foot with other specified severity: Secondary | ICD-10-CM | POA: Diagnosis present

## 2020-03-09 DIAGNOSIS — M869 Osteomyelitis, unspecified: Secondary | ICD-10-CM | POA: Diagnosis present

## 2020-03-09 DIAGNOSIS — M009 Pyogenic arthritis, unspecified: Secondary | ICD-10-CM | POA: Diagnosis present

## 2020-03-09 DIAGNOSIS — B353 Tinea pedis: Secondary | ICD-10-CM

## 2020-03-09 DIAGNOSIS — J9601 Acute respiratory failure with hypoxia: Secondary | ICD-10-CM | POA: Diagnosis not present

## 2020-03-09 DIAGNOSIS — D508 Other iron deficiency anemias: Secondary | ICD-10-CM | POA: Diagnosis not present

## 2020-03-09 DIAGNOSIS — G629 Polyneuropathy, unspecified: Secondary | ICD-10-CM | POA: Diagnosis not present

## 2020-03-09 DIAGNOSIS — F17200 Nicotine dependence, unspecified, uncomplicated: Secondary | ICD-10-CM | POA: Diagnosis present

## 2020-03-09 DIAGNOSIS — R0602 Shortness of breath: Secondary | ICD-10-CM | POA: Diagnosis not present

## 2020-03-09 DIAGNOSIS — E119 Type 2 diabetes mellitus without complications: Secondary | ICD-10-CM | POA: Diagnosis not present

## 2020-03-09 DIAGNOSIS — E785 Hyperlipidemia, unspecified: Secondary | ICD-10-CM

## 2020-03-09 DIAGNOSIS — L03119 Cellulitis of unspecified part of limb: Secondary | ICD-10-CM | POA: Diagnosis present

## 2020-03-09 DIAGNOSIS — R0902 Hypoxemia: Secondary | ICD-10-CM

## 2020-03-09 DIAGNOSIS — I10 Essential (primary) hypertension: Secondary | ICD-10-CM

## 2020-03-09 DIAGNOSIS — E878 Other disorders of electrolyte and fluid balance, not elsewhere classified: Secondary | ICD-10-CM | POA: Diagnosis present

## 2020-03-09 DIAGNOSIS — E871 Hypo-osmolality and hyponatremia: Secondary | ICD-10-CM | POA: Diagnosis present

## 2020-03-09 DIAGNOSIS — A4902 Methicillin resistant Staphylococcus aureus infection, unspecified site: Secondary | ICD-10-CM | POA: Diagnosis not present

## 2020-03-09 DIAGNOSIS — Z20822 Contact with and (suspected) exposure to covid-19: Secondary | ICD-10-CM | POA: Diagnosis present

## 2020-03-09 DIAGNOSIS — L0390 Cellulitis, unspecified: Secondary | ICD-10-CM

## 2020-03-09 DIAGNOSIS — T8131XA Disruption of external operation (surgical) wound, not elsewhere classified, initial encounter: Secondary | ICD-10-CM | POA: Diagnosis not present

## 2020-03-09 DIAGNOSIS — L02611 Cutaneous abscess of right foot: Secondary | ICD-10-CM | POA: Diagnosis present

## 2020-03-09 DIAGNOSIS — E1142 Type 2 diabetes mellitus with diabetic polyneuropathy: Secondary | ICD-10-CM | POA: Diagnosis present

## 2020-03-09 DIAGNOSIS — G9341 Metabolic encephalopathy: Secondary | ICD-10-CM | POA: Diagnosis not present

## 2020-03-09 DIAGNOSIS — I70234 Atherosclerosis of native arteries of right leg with ulceration of heel and midfoot: Secondary | ICD-10-CM | POA: Diagnosis not present

## 2020-03-09 DIAGNOSIS — E876 Hypokalemia: Secondary | ICD-10-CM | POA: Diagnosis not present

## 2020-03-09 DIAGNOSIS — K219 Gastro-esophageal reflux disease without esophagitis: Secondary | ICD-10-CM | POA: Diagnosis present

## 2020-03-09 DIAGNOSIS — L089 Local infection of the skin and subcutaneous tissue, unspecified: Secondary | ICD-10-CM | POA: Diagnosis not present

## 2020-03-09 DIAGNOSIS — Z87898 Personal history of other specified conditions: Secondary | ICD-10-CM | POA: Diagnosis not present

## 2020-03-09 DIAGNOSIS — G40909 Epilepsy, unspecified, not intractable, without status epilepticus: Secondary | ICD-10-CM | POA: Diagnosis present

## 2020-03-09 DIAGNOSIS — D509 Iron deficiency anemia, unspecified: Secondary | ICD-10-CM | POA: Diagnosis not present

## 2020-03-09 DIAGNOSIS — J189 Pneumonia, unspecified organism: Secondary | ICD-10-CM | POA: Diagnosis not present

## 2020-03-09 DIAGNOSIS — I96 Gangrene, not elsewhere classified: Secondary | ICD-10-CM

## 2020-03-09 DIAGNOSIS — D473 Essential (hemorrhagic) thrombocythemia: Secondary | ICD-10-CM | POA: Diagnosis not present

## 2020-03-09 DIAGNOSIS — E872 Acidosis: Secondary | ICD-10-CM | POA: Diagnosis present

## 2020-03-09 DIAGNOSIS — E11621 Type 2 diabetes mellitus with foot ulcer: Secondary | ICD-10-CM | POA: Diagnosis present

## 2020-03-09 DIAGNOSIS — G63 Polyneuropathy in diseases classified elsewhere: Secondary | ICD-10-CM | POA: Diagnosis not present

## 2020-03-09 DIAGNOSIS — I739 Peripheral vascular disease, unspecified: Secondary | ICD-10-CM | POA: Diagnosis not present

## 2020-03-09 DIAGNOSIS — E1152 Type 2 diabetes mellitus with diabetic peripheral angiopathy with gangrene: Secondary | ICD-10-CM | POA: Diagnosis present

## 2020-03-09 DIAGNOSIS — R739 Hyperglycemia, unspecified: Secondary | ICD-10-CM | POA: Diagnosis not present

## 2020-03-09 LAB — CBC
HCT: 25.4 % — ABNORMAL LOW (ref 36.0–46.0)
Hemoglobin: 8.7 g/dL — ABNORMAL LOW (ref 12.0–15.0)
MCH: 28.2 pg (ref 26.0–34.0)
MCHC: 34.3 g/dL (ref 30.0–36.0)
MCV: 82.2 fL (ref 80.0–100.0)
Platelets: 432 10*3/uL — ABNORMAL HIGH (ref 150–400)
RBC: 3.09 MIL/uL — ABNORMAL LOW (ref 3.87–5.11)
RDW: 12.4 % (ref 11.5–15.5)
WBC: 24 10*3/uL — ABNORMAL HIGH (ref 4.0–10.5)
nRBC: 0 % (ref 0.0–0.2)

## 2020-03-09 LAB — BASIC METABOLIC PANEL
Anion gap: 9 (ref 5–15)
BUN: 17 mg/dL (ref 6–20)
CO2: 24 mmol/L (ref 22–32)
Calcium: 7.8 mg/dL — ABNORMAL LOW (ref 8.9–10.3)
Chloride: 96 mmol/L — ABNORMAL LOW (ref 98–111)
Creatinine, Ser: 1.09 mg/dL — ABNORMAL HIGH (ref 0.44–1.00)
GFR calc Af Amer: >60 mL/min (ref >60)
GFR calc non Af Amer: >60 mL/min (ref >60)
Glucose, Bld: 422 mg/dL — ABNORMAL HIGH (ref 70–99)
Potassium: 3.4 mmol/L — ABNORMAL LOW (ref 3.5–5.1)
Sodium: 129 mmol/L — ABNORMAL LOW (ref 135–145)

## 2020-03-09 LAB — GLUCOSE, CAPILLARY
Glucose-Capillary: 471 mg/dL — ABNORMAL HIGH (ref 70–99)
Glucose-Capillary: 453 mg/dL — ABNORMAL HIGH (ref 70–99)
Glucose-Capillary: 392 mg/dL — ABNORMAL HIGH (ref 70–99)
Glucose-Capillary: 125 mg/dL — ABNORMAL HIGH (ref 70–99)
Glucose-Capillary: 59 mg/dL — ABNORMAL LOW (ref 70–99)
Glucose-Capillary: 109 mg/dL — ABNORMAL HIGH (ref 70–99)
Glucose-Capillary: 101 mg/dL — ABNORMAL HIGH (ref 70–99)
Glucose-Capillary: 119 mg/dL — ABNORMAL HIGH (ref 70–99)
Glucose-Capillary: 121 mg/dL — ABNORMAL HIGH (ref 70–99)

## 2020-03-09 LAB — SARS CORONAVIRUS 2 BY RT PCR (HOSPITAL ORDER, PERFORMED IN ~~LOC~~ HOSPITAL LAB): SARS Coronavirus 2: NEGATIVE

## 2020-03-09 LAB — HEMOGLOBIN A1C
Hgb A1c MFr Bld: 9.7 % — ABNORMAL HIGH (ref 4.8–5.6)
Mean Plasma Glucose: 231.69 mg/dL

## 2020-03-09 LAB — HIV ANTIBODY (ROUTINE TESTING W REFLEX): HIV Screen 4th Generation wRfx: NONREACTIVE

## 2020-03-09 MED ORDER — ACETAMINOPHEN 325 MG PO TABS
650.0000 mg | ORAL_TABLET | Freq: Four times a day (QID) | ORAL | Status: DC | PRN
  Administered 2020-03-09 – 2020-03-15 (×5): 650 mg via ORAL
  Filled 2020-03-09 (×6): qty 2

## 2020-03-09 MED ORDER — LACTATED RINGERS IV SOLN: INTRAVENOUS | Status: DC

## 2020-03-09 MED ORDER — SODIUM CHLORIDE 0.9 % IV SOLN: INTRAVENOUS | Status: DC

## 2020-03-09 MED ORDER — METRONIDAZOLE IN NACL 5-0.79 MG/ML-% IV SOLN
500.0000 mg | Freq: Once | INTRAVENOUS | Status: AC
  Administered 2020-03-09: 500 mg via INTRAVENOUS
  Filled 2020-03-09: qty 100

## 2020-03-09 MED ORDER — POTASSIUM CHLORIDE CRYS ER 20 MEQ PO TBCR
40.0000 meq | EXTENDED_RELEASE_TABLET | Freq: Once | ORAL | Status: AC
  Administered 2020-03-09: 40 meq via ORAL
  Filled 2020-03-09: qty 2

## 2020-03-09 MED ORDER — VANCOMYCIN HCL 500 MG/100ML IV SOLN
500.0000 mg | Freq: Once | INTRAVENOUS | Status: AC
  Administered 2020-03-09: 500 mg via INTRAVENOUS
  Filled 2020-03-09: qty 100

## 2020-03-09 MED ORDER — SODIUM CHLORIDE 0.9 % IV SOLN
2.0000 g | Freq: Once | INTRAVENOUS | Status: AC
  Administered 2020-03-09: 2 g via INTRAVENOUS
  Filled 2020-03-09: qty 2

## 2020-03-09 MED ORDER — ZONISAMIDE 100 MG PO CAPS
100.0000 mg | ORAL_CAPSULE | Freq: Every day | ORAL | Status: DC
  Administered 2020-03-09 – 2020-03-17 (×7): 100 mg via ORAL
  Filled 2020-03-09 (×9): qty 1

## 2020-03-09 MED ORDER — INSULIN ASPART 100 UNIT/ML ~~LOC~~ SOLN
0.0000 [IU] | SUBCUTANEOUS | Status: DC
  Administered 2020-03-09 (×3): 20 [IU] via SUBCUTANEOUS
  Filled 2020-03-09 (×4): qty 1

## 2020-03-09 MED ORDER — TERBINAFINE HCL 1 % EX CREA
TOPICAL_CREAM | Freq: Two times a day (BID) | CUTANEOUS | Status: DC
  Administered 2020-03-16: 1 via TOPICAL
  Filled 2020-03-09: qty 12

## 2020-03-09 MED ORDER — ONDANSETRON HCL 4 MG PO TABS: 4.0000 mg | ORAL_TABLET | Freq: Four times a day (QID) | ORAL | Status: DC | PRN

## 2020-03-09 MED ORDER — INSULIN ASPART 100 UNIT/ML ~~LOC~~ SOLN
0.0000 [IU] | Freq: Every day | SUBCUTANEOUS | Status: DC
  Administered 2020-03-12: 2 [IU] via SUBCUTANEOUS
  Filled 2020-03-09 (×2): qty 1

## 2020-03-09 MED ORDER — ONDANSETRON HCL 4 MG/2ML IJ SOLN
4.0000 mg | Freq: Once | INTRAMUSCULAR | Status: AC
  Administered 2020-03-09: 4 mg via INTRAVENOUS
  Filled 2020-03-09: qty 2

## 2020-03-09 MED ORDER — INSULIN ASPART 100 UNIT/ML ~~LOC~~ SOLN
0.0000 [IU] | Freq: Three times a day (TID) | SUBCUTANEOUS | Status: DC
  Administered 2020-03-10 (×3): 2 [IU] via SUBCUTANEOUS
  Administered 2020-03-11 (×2): 1 [IU] via SUBCUTANEOUS
  Administered 2020-03-11: 2 [IU] via SUBCUTANEOUS
  Administered 2020-03-12 – 2020-03-15 (×3): 3 [IU] via SUBCUTANEOUS
  Administered 2020-03-17: 1 [IU] via SUBCUTANEOUS
  Filled 2020-03-09 (×11): qty 1

## 2020-03-09 MED ORDER — ENOXAPARIN SODIUM 40 MG/0.4ML ~~LOC~~ SOLN
40.0000 mg | SUBCUTANEOUS | Status: DC
  Administered 2020-03-09 – 2020-03-10 (×2): 40 mg via SUBCUTANEOUS
  Filled 2020-03-09 (×3): qty 0.4

## 2020-03-09 MED ORDER — MORPHINE SULFATE (PF) 2 MG/ML IV SOLN
2.0000 mg | Freq: Once | INTRAVENOUS | Status: AC
  Administered 2020-03-09: 2 mg via INTRAVENOUS
  Filled 2020-03-09: qty 1

## 2020-03-09 MED ORDER — ATORVASTATIN CALCIUM 20 MG PO TABS
40.0000 mg | ORAL_TABLET | Freq: Every day | ORAL | Status: DC
  Administered 2020-03-09 – 2020-03-15 (×5): 40 mg via ORAL
  Filled 2020-03-09 (×8): qty 2

## 2020-03-09 MED ORDER — VANCOMYCIN HCL IN DEXTROSE 1-5 GM/200ML-% IV SOLN
1000.0000 mg | Freq: Two times a day (BID) | INTRAVENOUS | Status: DC
  Administered 2020-03-09 – 2020-03-12 (×6): 1000 mg via INTRAVENOUS
  Filled 2020-03-09 (×7): qty 200

## 2020-03-09 MED ORDER — CALCIUM CARBONATE-VITAMIN D 500-200 MG-UNIT PO TABS
1.0000 | ORAL_TABLET | Freq: Every day | ORAL | Status: DC
  Administered 2020-03-09 – 2020-03-17 (×7): 1 via ORAL
  Filled 2020-03-09 (×8): qty 1

## 2020-03-09 MED ORDER — VANCOMYCIN HCL IN DEXTROSE 1-5 GM/200ML-% IV SOLN: 1000.0000 mg | Freq: Once | INTRAVENOUS | Status: DC

## 2020-03-09 MED ORDER — SODIUM CHLORIDE 0.9 % IV SOLN
2.0000 g | Freq: Three times a day (TID) | INTRAVENOUS | Status: DC
  Administered 2020-03-09 – 2020-03-12 (×10): 2 g via INTRAVENOUS
  Filled 2020-03-09 (×13): qty 2

## 2020-03-09 MED ORDER — TRAZODONE HCL 50 MG PO TABS: 25.0000 mg | ORAL_TABLET | Freq: Every evening | ORAL | Status: DC | PRN

## 2020-03-09 MED ORDER — LISINOPRIL 5 MG PO TABS
2.5000 mg | ORAL_TABLET | Freq: Every day | ORAL | Status: DC
  Administered 2020-03-09 – 2020-03-17 (×5): 2.5 mg via ORAL
  Filled 2020-03-09 (×7): qty 1

## 2020-03-09 MED ORDER — VANCOMYCIN HCL IN DEXTROSE 1-5 GM/200ML-% IV SOLN
1000.0000 mg | Freq: Once | INTRAVENOUS | Status: AC
  Administered 2020-03-09: 1000 mg via INTRAVENOUS
  Filled 2020-03-09: qty 200

## 2020-03-09 MED ORDER — ONDANSETRON HCL 4 MG/2ML IJ SOLN: 4.0000 mg | Freq: Four times a day (QID) | INTRAMUSCULAR | Status: DC | PRN

## 2020-03-09 MED ORDER — POTASSIUM CHLORIDE 20 MEQ PO PACK
40.0000 meq | PACK | Freq: Once | ORAL | Status: AC
  Administered 2020-03-09: 40 meq via ORAL
  Filled 2020-03-09: qty 2

## 2020-03-09 MED ORDER — LINAGLIPTIN 5 MG PO TABS
5.0000 mg | ORAL_TABLET | Freq: Every day | ORAL | Status: DC
  Administered 2020-03-09 – 2020-03-17 (×7): 5 mg via ORAL
  Filled 2020-03-09 (×11): qty 1

## 2020-03-09 MED ORDER — MAGNESIUM HYDROXIDE 400 MG/5ML PO SUSP: 30.0000 mL | Freq: Every day | ORAL | Status: DC | PRN

## 2020-03-09 MED ORDER — VITAMIN D 25 MCG (1000 UNIT) PO TABS
1000.0000 [IU] | ORAL_TABLET | Freq: Every day | ORAL | Status: DC
  Administered 2020-03-09 – 2020-03-17 (×5): 1000 [IU] via ORAL
  Filled 2020-03-09 (×7): qty 1

## 2020-03-09 MED ORDER — DULOXETINE HCL 30 MG PO CPEP
30.0000 mg | ORAL_CAPSULE | Freq: Two times a day (BID) | ORAL | Status: DC
  Administered 2020-03-09 – 2020-03-10 (×2): 30 mg via ORAL
  Filled 2020-03-09 (×14): qty 1

## 2020-03-09 MED ORDER — ACETAMINOPHEN 650 MG RE SUPP: 650.0000 mg | Freq: Four times a day (QID) | RECTAL | Status: DC | PRN

## 2020-03-09 MED ORDER — VITAMIN B-12 1000 MCG PO TABS
500.0000 ug | ORAL_TABLET | Freq: Every day | ORAL | Status: DC
  Administered 2020-03-09 – 2020-03-17 (×7): 500 ug via ORAL
  Filled 2020-03-09 (×5): qty 1
  Filled 2020-03-09: qty 0.5
  Filled 2020-03-09: qty 1

## 2020-03-09 MED ORDER — INSULIN DETEMIR 100 UNIT/ML ~~LOC~~ SOLN
10.0000 [IU] | Freq: Every day | SUBCUTANEOUS | Status: DC
  Administered 2020-03-09 – 2020-03-16 (×6): 10 [IU] via SUBCUTANEOUS
  Filled 2020-03-09 (×10): qty 0.1

## 2020-03-09 NOTE — H&P (Signed)
[facility]   PATIENT NAME: Kristy Mcmillan    MR#:  [id number]  DATE OF BIRTH:  1976/04/02  DATE OF ADMISSION:  03/09/2020  PRIMARY CARE PHYSICIAN: [facility] [place]   REQUESTING/REFERRING PHYSICIAN: Marjean Donna, MD  CHIEF COMPLAINT:   Chief Complaint  Patient presents with  . Foot Swelling  . Fever    HISTORY OF PRESENT ILLNESS:  Kristy Mcmillan  is a 44 y.o. Caucasian female with a known history of diabetes mellitus and seizure disorder, who presented to the emergency room with acute onset of worsening right foot swelling with associated erythema, pain and tenderness.  She had a pedicure of her feet on 8/30 right before her symptoms started.  She has been having fever over the last couple of days with associated chills.  No nausea or vomiting or abdominal pain.  No cough or wheezing or dyspnea or palpitations.  She has not been vaccinated against COVID-19.  No headache or dizziness or blurred vision.  No paresthesias or focal muscle weakness.  Upon presentation to the emergency room, temperature was 100.5/38.1 with a heart rate of 118, respiratory rate of 20 and later 29.  Labs revealed borderline potassium of 3.5 and hyponatremia 130 with hypochloremia 93 and hyperglycemia 428.  CMP was otherwise unremarkable.  Lactic acid was 1.3.  CBC showed significant cytosis 26.8 with neutrophilia and anemia as well as thrombocytosis.  Blood cultures were drawn. EKG showed sinus tachycardia with rate of 117 with poor R progression and T wave inversion inferiorly.  The patient was given a gram of p.o. Tylenol, IV cefepime, Flagyl and vancomycin, IV morphine sulfate and IV Zofran and I added 40 mill: P.o. potassium chloride.  She will be admitted to a medically monitored bed for further evaluation and management. PAST MEDICAL HISTORY:   Past Medical History:  Diagnosis Date  . Diabetes mellitus without complication ([place])   . Seizures ([place]) 2005 Last sz   childhood febrile  sz, then ?eclampsia    PAST SURGICAL HISTORY:   Past Surgical History:  Procedure Laterality Date  . CESAREAN SECTION    . CESAREAN SECTION N/A 03/02/2013   Procedure: CESAREAN SECTION;  Surgeon: Florian Buff, MD;  Location: [facility];  Service: Obstetrics;  Laterality: N/A;  Repeat Cesarean Section Delivery Nonviable Baby Girl @ 2158    SOCIAL HISTORY:   Social History   Tobacco Use  . Smoking status: Current Some Day Smoker  . Smokeless tobacco: Never Used  Substance Use Topics  . Alcohol use: No    FAMILY HISTORY:   Family History  Problem Relation Age of Onset  . Diabetes Mother   . Diabetes Father     DRUG ALLERGIES:   Allergies  Allergen Reactions  . Dilantin [Phenytoin Sodium Extended] Other (See Comments)    Causes seizures  . Phenobarbital Other (See Comments)    Causes seizures Other reaction(s): Unknown Causes seizures  . Phenytoin Sodium Extended     Other reaction(s): Other (See Comments) Causes seizures  . Aspartame Diarrhea and Nausea And Vomiting  . Aspartame And Phenylalanine Diarrhea and Nausea And Vomiting  . Carbamazepine Other (See Comments)    Other reaction(s): Unknown  . Gabapentin Other (See Comments)    Other reaction(s): Unknown Feels like she is going to have a seizure  . Ciprofloxacin Rash    blisters  . Humalog [Insulin Lispro] Rash  . Lantus [Insulin Glargine] Rash  . Novolin 70-30 [Insulin Nph Isophane & Regular] Itching  and Rash  . Penicillins Itching and Rash    REVIEW OF SYSTEMS:   ROS As per history of present illness. All pertinent systems were reviewed above. Constitutional, HEENT, cardiovascular, respiratory, GI, GU, musculoskeletal, neuro, psychiatric, endocrine, integumentary and hematologic systems were reviewed and are otherwise negative/unremarkable except for positive findings mentioned above in the HPI.   MEDICATIONS AT HOME:   Prior to Admission medications   Medication Sig Start Date End Date Taking?  Authorizing Provider  amitriptyline (ELAVIL) 25 MG tablet TAKE 2 TABLETS AT NIGHT, INCREASE TO 3 TABLETS AFTER 2 WEEKS IF NEEDED 09/20/18   [provider]  atorvastatin (LIPITOR) 40 MG tablet Take 40 mg by mouth at bedtime. 11/29/19   [provider]  calcium-vitamin D (OSCAL WITH D) 500-200 MG-UNIT tablet Take 1 tablet by mouth.    [provider]  cholecalciferol (VITAMIN D) 25 MCG (1000 UT) tablet Take 1,000 Units by mouth daily.    [provider]  DULoxetine (CYMBALTA) 30 MG capsule Take 1 capsule by mouth 2 (two) times daily. 11/16/19   [provider]  insulin aspart (NOVOLOG) 100 UNIT/ML injection Inject 6 Units into the skin 3 (three) times daily with meals. 03/04/13   Kassie Mends, MD  insulin detemir (LEVEMIR) 100 UNIT/ML FlexPen Inject 10 Units into the skin at bedtime. 01/20/20 01/19/21  [provider]  linagliptin (TRADJENTA) 5 MG TABS tablet Take 5 mg by mouth daily.    [provider]  lisinopril (ZESTRIL) 2.5 MG tablet Take 2.5 mg by mouth daily.    [provider]  vitamin B-12 (CYANOCOBALAMIN) 500 MCG tablet Take 500 mcg by mouth daily.    [provider]  zonisamide (ZONEGRAN) 100 MG capsule Take 100 mg by mouth daily. 01/26/20 01/25/21  [provider]      VITAL SIGNS:  Blood pressure 140/80, pulse 100, temperature (!) 100.5 F (38.1 C), temperature source Oral, resp. rate (!) 24, height 5\' 4"  (1.626 m), weight 75.8 kg, SpO2 96 %, unknown if currently breastfeeding.  PHYSICAL EXAMINATION:  Physical Exam  GENERAL:  44 y.o.-year-old Caucasian female patient lying in the bed with no acute distress.  EYES: Pupils equal, round, reactive to light and accommodation. No scleral icterus. Extraocular muscles intact.  HEENT: Head atraumatic, normocephalic. Oropharynx and nasopharynx clear.  NECK:  Supple, no jugular venous distention. No thyroid enlargement, no tenderness.  LUNGS: Normal breath  sounds bilaterally, no wheezing, rales,rhonchi or crepitation. No use of accessory muscles of respiration.  CARDIOVASCULAR: Regular rate and rhythm, S1, S2 normal. No murmurs, rubs, or gallops.  ABDOMEN: Soft, nondistended, nontender. Bowel sounds present. No organomegaly or mass.  EXTREMITIES: As below in skin, right foot swelling with erythema and pitting edema slightly extending to the mid leg with no cyanosis, or clubbing.  NEUROLOGIC: Cranial nerves II through XII are intact. Muscle strength 5/5 in all extremities. Sensation intact. Gait not checked.  PSYCHIATRIC: The patient is alert and oriented x 3.  Normal affect and good eye contact. SKIN: Right with erythema, warmth, tenderness and swelling with skin sloughing of the fifth toe that is more swollen and tender with posterior black discoloration.        LABORATORY PANEL:   CBC Recent Labs  Lab 03/08/20 1925  WBC 26.8*  HGB 10.5*  HCT 30.8*  PLT 578*   ------------------------------------------------------------------------------------------------------------------  Chemistries  Recent Labs  Lab 03/08/20 1925  NA 130*  K 3.5  CL 93*  CO2 25  GLUCOSE 428*  BUN 18  CREATININE 1.17*  CALCIUM 8.5*  AST 11*  ALT 10  ALKPHOS 122  BILITOT 0.7   ------------------------------------------------------------------------------------------------------------------  Cardiac Enzymes No results for input(s): TROPONINI in the last 168 hours. ------------------------------------------------------------------------------------------------------------------  RADIOLOGY:  DG Foot Complete Right  Result Date: 03/08/2020 CLINICAL DATA:  Foot infection EXAM: RIGHT FOOT COMPLETE - 3+ VIEW COMPARISON:  11/29/2018 FINDINGS: No fracture or malalignment. Vascular calcifications. Small plantar calcaneal spur. Joint spaces are maintained. IMPRESSION: No acute osseous abnormality. Electronically Signed   By: Donavan Foil M.D.   On:  03/08/2020 19:49      IMPRESSION AND PLAN:   1.  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  The patient has subsequent sepsis without severe sepsis or septic shock as manifested by significant leuko-cytosis, fever, tachycardia and tachypnea. -The patient will be admitted to a medically monitored bed. -We will continue antibiotic therapy with IV cefepime and vancomycin. -Wound care consult to be obtained. -General surgery consult to be obtained. -Given the likelihood of underlying tinea pedis that could be preceding bacterial superinfection, I will place the patient on Lamisil cream twice daily. -The patient will be placed on resistant subcutaneous NovoLog with frequent fingerstick blood glucose measures in addition to hydration with IV normal saline. -We will continue basal coverage and p.o. Tradjenta.  2.  Essential hypertension. -We will continue Zestril.  3.  GERD. -PPI therapy will be resumed.  4.  Peripheral neuropathy. -We will continue Cymbalta.  5.  DVT prophylaxis. -Subcutaneous Lovenox.   All the records are reviewed and case discussed with ED provider. The plan of care was discussed in details with the patient (and family). I answered all questions. The patient agreed to proceed with the above mentioned plan. Further management will depend upon hospital course.   CODE STATUS: Full code  Status is: Inpatient  Remains inpatient appropriate because:Ongoing active pain requiring inpatient pain management, Ongoing diagnostic testing needed not appropriate for outpatient work up, Unsafe d/c plan, IV treatments appropriate due to intensity of illness or inability to take PO and Inpatient level of care appropriate due to severity of illness   Dispo: The patient is from: Home              Anticipated d/c is to: Home              Anticipated d/c date is: 3 days               Patient currently is not medically stable to d/c.   TOTAL TIME TAKING CARE OF THIS PATIENT: 55 minutes.    Christel Mormon M.D on 03/09/2020 at 1:25 AM  [facility]   From 7 PM-7 AM, contact night-coverage [email or web address]  CC: Primary care physician; [facility]   Note: This dictation was prepared with Dragon dictation along with smaller phrase technology. Any transcriptional typo errors that result from this process are unintentional.

## 2020-03-09 NOTE — Progress Notes (Signed)
CODE SEPSIS - PHARMACY COMMUNICATION  **Broad Spectrum Antibiotics should be administered within 1 hour of Sepsis diagnosis**  Time Code Sepsis Called/Page Received: 1217  Antibiotics Ordered: cefepime/vanc  Time of 1st antibiotic administration: 0103  Additional action taken by pharmacy:   If necessary, Name of Provider/Nurse Contacted:     Tobie Lords ,PharmD Clinical Pharmacist  03/09/2020  2:13 AM

## 2020-03-09 NOTE — ED Notes (Addendum)
Per admitting MD Mansy, pt to have q1h BG checks x2, then q2h x2, then q4h. Pt now NPO until sugar controlled per MD Mansy.

## 2020-03-09 NOTE — ED Notes (Signed)
fsbs is low.  She is eating breakfast now--eating a bit of oatmeal.  I brought her some orange juice and encouraged her to drink that and her husband will continue to try to get her to eat more.

## 2020-03-09 NOTE — Progress Notes (Signed)
Care started prior to midnight in the emergency room and patient has been earliest morning after midnight by Dr. Eugenie Norrie and I'm in current agreement with his assessment and plan.  Additional changes to the plan of care been made accordingly.  The patient is a 44 year old overweight Caucasian female with a past medical history significant for but not limited to is mellitus type II, history of seizure disorder who presented to the emergency room with acute onset of worsening right foot swelling associated with erythema, pain and tenderness.  She had a pedicure on 02/27/2020 before symptoms and began having a fever over the last couple days associated with chills.  She denies any nausea or vomiting.  She is not vaccinated against COVID-19 disease and upon presentation she is found to be febrile with a tachycardia and a respiratory rate of 29.  Labs revealed borderline potassium of 2.5 and she did have a hyponatremia and hypochloremia and elevated blood sugar with a hyperglycemia of 428.  In the ED sepsis protocol was started and she is given acetaminophen, IV cefepime, Flagyl and IV vancomycin as well as morphine sulfate IV Zofran.  Her potassium was repleted as well.  She was admitted for sepsis secondary to right foot extremity cellulitis with right fifth toe necrosis in the setting of an uncontrolled diabetic foot.  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   -The patient has subsequent sepsis without severe sepsis or septic shock as manifested by significant leuko-cytosis, fever, tachycardia and tachypnea. -DG of the foot showed "No fracture or malalignment. Vascular calcifications. Small plantar calcaneal spur. Joint spaces are maintained." -The patient will be admitted to a medically monitored bed. -We will continue antibiotic therapy with  IV cefepime and vancomycin. -Wound care consult to be obtained. -Podiatry consult has been obtained and they request vascular surgery evaluation as well -Vascular surgery has been consulted as well -We'll check ABIs -May need further imaging of the foot and will consider an MRI of the foot -Blood cultures x2 have been obtained -Given the likelihood of underlying tinea pedis that could be preceding bacterial superinfection, she was started n Lamisil cream twice daily. -She is both on lactated Ringer's as well as normal saline we discontinue lactated Ringer's 150 MLS per hour and continue normal saline at 100 mL's per hour -Continue to follow blood cultures and temperature curve and repeat CBC in a.m. -WBC on admission was 26.8 and is improved to 24.0 slightly -Continue supportive care and continue with antiemetics 4 mg p.o. every 6 as needed for nausea  Uncontrolled hyperglycemia in the setting of type 2 diabetes mellitus -Patient hemoglobin A1c was 9.7 -The patient will be placed on resistant subcutaneous NovoLog with frequent fingerstick blood glucose measures in addition to hydration with IV normal saline however she was changed to sensitive NovoLog sliding scale before meals and at bedtime given her hypoglycemic episode -We will continue basal coverage Levemir 10 units subcu and p.o. Tradjenta.  Essential Hypertension. -Continue with lisinopril 2.5 mg p.o. daily  Hyperlipidemia -Continue with Atorvastatin 40 g p.o. nightly  GERD. -PPI therapy will be resumed.  Hyponatremia/Hypochloremia -Mild patient's sodium went from 130 is now 129 -Patient's chloride level of 93 is now 96 -Continue with normal saline at 100 MLS per hour -Continue monitor and trend and repeat CMP in a.m.  Hypokalemia -Patient's potassium this morning  was 3.4 -Check Magnesium Level  -Replete p.o. KCl 40 mEQ BID x2 -Continue to Monitor and Replete as Necessary -Repeat CMP in the  AM  Thrombocytosis -Patient's platelet count on admission was 570 and is improved to 532 -Likely is Reactive in the setting of Infection -Continue monitor and trend and Repeat CBC in a.m.  Mild AKI -Patient's BUNs/creatinine on admission was 18/1.17 and improving and is now 17/1.09 -Baseline appears to be from 0.6-0.8 -Continue with IV fluid hydration as above -Avoid further nephrotoxic medications, contrast dyes, and hypotension and renally adjust medications  -Repeat CBC in a.m.  Normocytic Anemia -Patient's hemoglobin/hematocrit admission was 10.5/30.8 and is now dropped to 8.7/25.4 likely in the setting of a dilutional drop from IV fluids -Continue with IV fluids as above next-check anemia panel in the morning -Continue to monitor for signs and symptoms of bleeding; currently no overt bleeding noted -Repeat CBC in the AM   Diabetic Peripheral Neuropathy. -Continue with Duloxetine 30 mg q. twice daily  History of Seizures  -Continue with Zonisamide 100 g p.o. daily  -Has had childhood febrile seizure and questionable eclampsia  Continue to monitor patient's clinical response to intervention and repeat blood work and follow-up on specialist recommendations

## 2020-03-09 NOTE — Progress Notes (Signed)
[facility]  Received consult for possible PAD Patient last seen by Dr. Delana Meyer 11/04/19, during that visit: ABI's   Right: Non-Compressable   Left: Non-Compressable  Doppler signals are strongly triphasic    Toe tracings are strongly pulsatile  Encouraged conservative management in regard to managing lower extremity edema including medical grade one compression stockings and elevation.  ABI is about a year and a half along - pending current ABI Based on ABI possible angiogram Monday / Tuesday Full consult to follow  Discussed with Dr. Ellis Parents Tong Pieczynski PA-C 03/09/2020 2:13 PM

## 2020-03-09 NOTE — ED Notes (Signed)
Pt

## 2020-03-09 NOTE — Progress Notes (Signed)
PHARMACY -  BRIEF ANTIBIOTIC NOTE   Pharmacy has received consult(s) for vanc/cefepime from an ED provider.  The patient's profile has been reviewed for ht/wt/allergies/indication/available labs.    One time order(s) placed for vanc 1.5g IV load and cefepime 2g IV x 1  Further antibiotics/pharmacy consults should be ordered by admitting physician if indicated.                       Thank you,  Tobie Lords, PharmD, BCPS Clinical Pharmacist 03/09/2020  2:11 AM

## 2020-03-09 NOTE — ED Provider Notes (Signed)
[facility] Emergency Department Provider Note  ____________________________________________   First MD Initiated Contact with Patient 03/09/20 0026     (approximate)  I have reviewed the triage vital signs and the nursing notes.   HISTORY  Chief Complaint Foot Swelling and Fever    HPI Kristy Mcmillan is a 44 y.o. female with below list of previous medical conditions including diabetes mellitus presents to the emergency department secondary to progressive right lower extremity foot pain swelling redness since February 27, 2020.  Patient admits to fever the past 2 days.  Patient also admits to chills.  Patient states that symptoms began after she had a pedicure performed.        Past Medical History:  Diagnosis Date  . Diabetes mellitus without complication ([place])   . Seizures ([place]) 2005 Last sz   childhood febrile sz, then ?eclampsia    Patient Active Problem List   Diagnosis Date Noted  . Neuropathy due to secondary diabetes mellitus ([place]) 11/04/2018  . Pain in joint, lower leg 11/04/2018  . PAD (peripheral artery disease) ([place]) 11/04/2018  . Morbid obesity ([place]) 07/19/2013  . IUFD (intrauterine fetal death) 03-29-13  . GERD (gastroesophageal reflux disease) 12/01/2012  . Tick bite of abdomen 12/01/2012  . Previous cesarean section complicating pregnancy, antepartum condition or complication 58/52/7782  . Headache in pregnancy, antepartum 10/25/2012  . DM (diabetes mellitus), type 1 ([place]) 09/22/2012  . Seizure disorder ([place]) 09/22/2012    Past Surgical History:  Procedure Laterality Date  . CESAREAN SECTION    . CESAREAN SECTION N/A 2013/03/29   Procedure: CESAREAN SECTION;  Surgeon: Florian Buff, MD;  Location: [facility];  Service: Obstetrics;  Laterality: N/A;  Repeat Cesarean Section Delivery Nonviable Baby Girl @ 2158    Prior to Admission medications   Medication Sig Start Date End Date Taking? Authorizing Provider  amitriptyline  (ELAVIL) 25 MG tablet TAKE 2 TABLETS AT NIGHT, INCREASE TO 3 TABLETS AFTER 2 WEEKS IF NEEDED 09/20/18   [provider]  atorvastatin (LIPITOR) 40 MG tablet Take 40 mg by mouth at bedtime. 11/29/19   [provider]  calcium-vitamin D (OSCAL WITH D) 500-200 MG-UNIT tablet Take 1 tablet by mouth.    [provider]  cholecalciferol (VITAMIN D) 25 MCG (1000 UT) tablet Take 1,000 Units by mouth daily.    [provider]  DULoxetine (CYMBALTA) 30 MG capsule Take 1 capsule by mouth 2 (two) times daily. 11/16/19   [provider]  insulin aspart (NOVOLOG) 100 UNIT/ML injection Inject 6 Units into the skin 3 (three) times daily with meals. 03/04/13   Kassie Mends, MD  insulin detemir (LEVEMIR) 100 UNIT/ML FlexPen Inject 10 Units into the skin at bedtime. 01/20/20 01/19/21  [provider]  linagliptin (TRADJENTA) 5 MG TABS tablet Take 5 mg by mouth daily.    [provider]  lisinopril (ZESTRIL) 2.5 MG tablet Take 2.5 mg by mouth daily.    [provider]  vitamin B-12 (CYANOCOBALAMIN) 500 MCG tablet Take 500 mcg by mouth daily.    [provider]  zonisamide (ZONEGRAN) 100 MG capsule Take 100 mg by mouth daily. 01/26/20 01/25/21  [provider]    Allergies Dilantin [phenytoin sodium extended], Phenobarbital, Phenytoin sodium extended, Aspartame, Aspartame and phenylalanine, Carbamazepine, Gabapentin, Ciprofloxacin, Humalog [insulin lispro], Lantus [insulin glargine], Novolin 70-30 [insulin nph isophane & regular], and Penicillins  Family History  Problem Relation Age of Onset  . Diabetes Mother   .  Diabetes Father     Social History Social History   Tobacco Use  . Smoking status: Current Some Day Smoker  . Smokeless tobacco: Never Used  Substance Use Topics  . Alcohol use: No  . Drug use: No    Review of Systems Constitutional: Positive for fever/chills Eyes: No visual changes. ENT: No sore  throat. Cardiovascular: Denies chest pain. Respiratory: Denies shortness of breath. Gastrointestinal: No abdominal pain.  No nausea, no vomiting.  No diarrhea.  No constipation. Genitourinary: Negative for dysuria. Musculoskeletal: Positive for right lower extremity pain swelling redness. Integumentary: Negative for rash. Neurological: Negative for headaches, focal weakness or numbness.   ____________________________________________   PHYSICAL EXAM:  VITAL SIGNS: ED Triage Vitals  Enc Vitals Group     BP 03/08/20 1910 113/82     Pulse Rate 03/08/20 1910 (!) 118     Resp 03/08/20 1910 20     Temp 03/08/20 1910 (!) 100.5 F (38.1 C)     Temp Source 03/08/20 1910 Oral     SpO2 03/08/20 1910 100 %     Weight 03/08/20 1911 75.8 kg (167 lb)     Height 03/08/20 1911 1.626 m (5\' 4" )     Head Circumference --      Peak Flow --      Pain Score 03/08/20 1910 10     Pain Loc --      Pain Edu? --      Excl. in [place]? --     Constitutional: Alert and oriented.  Eyes: Conjunctivae are normal.  Head: Atraumatic. Ears:  Healthy appearing ear canals and TMs bilaterally Nose: No congestion/rhinnorhea. Mouth/Throat: Patient is wearing a mask. Neck: No stridor.  No meningeal signs.   Cardiovascular: Normal rate, regular rhythm. Good peripheral circulation. Grossly normal heart sounds. Respiratory: Normal respiratory effort.  No retractions. Gastrointestinal: Soft and nontender. No distention.  Musculoskeletal: Refer to picture below       Neurologic:  Normal speech and language. No gross focal neurologic deficits are appreciated.  Skin: Please refer to musculoskeletal exam.  Hot blanching erythema right foot extending to the medial leg to the knee. Psychiatric: Mood and affect are normal. Speech and behavior are normal.  ____________________________________________   LABS (all labs ordered are listed, but only abnormal results are displayed)  Labs Reviewed  CBC WITH  DIFFERENTIAL/PLATELET - Abnormal; Notable for the following components:      Result Value   WBC 26.8 (*)    RBC 3.76 (*)    Hemoglobin 10.5 (*)    HCT 30.8 (*)    Platelets 578 (*)    Neutro Abs 22.9 (*)    Monocytes Absolute 1.2 (*)    Abs Immature Granulocytes 0.38 (*)    All other components within normal limits  COMPREHENSIVE METABOLIC PANEL - Abnormal; Notable for the following components:   Sodium 130 (*)    Chloride 93 (*)    Glucose, Bld 428 (*)    Creatinine, Ser 1.17 (*)    Calcium 8.5 (*)    Total Protein 8.4 (*)    Albumin 2.4 (*)    AST 11 (*)    GFR calc non Af Amer 57 (*)    All other components within normal limits  CULTURE, BLOOD (ROUTINE X 2)  CULTURE, BLOOD (ROUTINE X 2)  SARS CORONAVIRUS 2 BY RT PCR (HOSPITAL ORDER, [facility])  LACTIC ACID, PLASMA  LACTIC ACID, PLASMA  URINALYSIS, COMPLETE (UACMP) WITH MICROSCOPIC  ____________________________________________  EKG  ED ECG REPORT I, [place] Kandra Graven, the attending physician, personally viewed and interpreted this ECG.   Date: 03/08/2020  EKG Time: 7:19 PM  Rate: 117  Rhythm: Sinus tachycardia  Axis: Normal  Intervals: Normal  ST&T Change: None  ____________________________________________  RADIOLOGY I, [place] Rachana Malesky, personally viewed and evaluated these images (plain radiographs) as part of my medical decision making, as well as reviewing the written report by the radiologist.  ED MD interpretation: No acute osseous abnormality noted on right foot x-ray per radiologist.  Official radiology report(s): DG Foot Complete Right  Result Date: 03/08/2020 CLINICAL DATA:  Foot infection EXAM: RIGHT FOOT COMPLETE - 3+ VIEW COMPARISON:  11/29/2018 FINDINGS: No fracture or malalignment. Vascular calcifications. Small plantar calcaneal spur. Joint spaces are maintained. IMPRESSION: No acute osseous abnormality. Electronically Signed   By: Donavan Foil M.D.   On: 03/08/2020  19:49    ____________________________________________   PROCEDURES    .Critical Care Performed by: Gregor Hams, MD Authorized by: Gregor Hams, MD   Critical care provider statement:    Critical care time (minutes):  30   Critical care time was exclusive of:  Separately billable procedures and treating other patients   Critical care was necessary to treat or prevent imminent or life-threatening deterioration of the following conditions:  Sepsis   Critical care was time spent personally by me on the following activities:  Development of treatment plan with patient or surrogate, discussions with consultants, evaluation of patient's response to treatment, examination of patient, obtaining history from patient or surrogate, ordering and performing treatments and interventions, ordering and review of laboratory studies, ordering and review of radiographic studies, pulse oximetry, re-evaluation of patient's condition and review of old charts     ____________________________________________   INITIAL IMPRESSION / MDM / [place] / ED COURSE  As part of my medical decision making, I reviewed the following data within the electronic MEDICAL RECORD NUMBER   44 year old female presented with above-stated history and physical exam meeting sepsis criteria.  As such sepsis protocol was initiated.  Clinical exam findings consistent with right foot cellulitis with extension to the leg.  Patient received appropriate IV antibiotic therapy.  X-ray revealed no evidence of osteomyelitis.  Patient discussed with Dr. Gayla Medicus for hospital admission for further evaluation and management  ____________________________________________  FINAL CLINICAL IMPRESSION(S) / ED DIAGNOSES  Final diagnoses:  Sepsis, due to unspecified organism, unspecified whether acute organ dysfunction present ([place])  Cellulitis of right foot     MEDICATIONS GIVEN DURING THIS VISIT:  Medications  lactated  ringers infusion (has no administration in time range)  ceFEPIme (MAXIPIME) 2 g in sodium chloride 0.9 % 100 mL IVPB (has no administration in time range)  metroNIDAZOLE (FLAGYL) IVPB 500 mg (has no administration in time range)  vancomycin (VANCOCIN) IVPB 1000 mg/200 mL premix (has no administration in time range)  acetaminophen (TYLENOL) tablet 1,000 mg (1,000 mg Oral Given 03/08/20 1923)     ED Discharge Orders    None      *Please note:  Willona D Schneck was evaluated in Emergency Department on 03/09/2020 for the symptoms described in the history of present illness. She was evaluated in the context of the global COVID-19 pandemic, which necessitated consideration that the patient might be at risk for infection with the SARS-CoV-2 virus that causes COVID-19. Institutional protocols and algorithms that pertain to the evaluation of patients at risk for COVID-19 are in a state of rapid  change based on information released by regulatory bodies including the CDC and federal and state organizations. These policies and algorithms were followed during the patient's care in the ED.  Some ED evaluations and interventions may be delayed as a result of limited staffing during and after the pandemic.*  Note:  This document was prepared using Dragon voice recognition software and may include unintentional dictation errors.   Gregor Hams, MD 03/09/20 [phone]

## 2020-03-09 NOTE — Progress Notes (Signed)
Pharmacy Antibiotic Note  Kristy Mcmillan is a 44 y.o. female admitted on 03/09/2020 with cellulitis.  Pharmacy has been consulted for vanc/cefepime dosing.  Plan: Patient received vanc 1.5g IV load in the ED. Will continue w/ vanc 1g IV q12h; however, appears to be in an AKI (baseline Scr 0.6 - 0.8; current 1.17), will monitor renal function closely and adjust doses w/ changes in renal function. Will continue cefepime 2g IV q8h per CrCl > 60 ml/min.  Goal trough 15 - 20 mcg/mL Ke 0.0551 T1/2 ~ 12 hrs  Height: 5\' 4"  (162.6 cm) Weight: 75.8 kg (167 lb) IBW/kg (Calculated) : 54.7  Temp (24hrs), Avg:100.5 F (38.1 C), Min:100.5 F (38.1 C), Max:100.5 F (38.1 C)  Recent Labs  Lab 03/08/20 1925  WBC 26.8*  CREATININE 1.17*  LATICACIDVEN 1.3    Estimated Creatinine Clearance: 61.1 mL/min (A) (by C-G formula based on SCr of 1.17 mg/dL (H)).    Allergies  Allergen Reactions  . Dilantin [Phenytoin Sodium Extended] Other (See Comments)    Causes seizures  . Phenobarbital Other (See Comments)    Causes seizures Other reaction(s): Unknown Causes seizures  . Phenytoin Sodium Extended     Other reaction(s): Other (See Comments) Causes seizures  . Aspartame Diarrhea and Nausea And Vomiting  . Aspartame And Phenylalanine Diarrhea and Nausea And Vomiting  . Carbamazepine Other (See Comments)    Other reaction(s): Unknown  . Gabapentin Other (See Comments)    Other reaction(s): Unknown Feels like she is going to have a seizure  . Ciprofloxacin Rash    blisters  . Humalog [Insulin Lispro] Rash  . Lantus [Insulin Glargine] Rash  . Novolin 70-30 [Insulin Nph Isophane & Regular] Itching and Rash  . Penicillins Itching and Rash    Thank you for allowing pharmacy to be a part of this patient's care.  Tobie Lords, PharmD, BCPS Clinical Pharmacist 03/09/2020 3:02 AM

## 2020-03-09 NOTE — Consult Note (Signed)
Reason for Consult: Diabetes with infection right foot. Referring Physician: Blane Ohara is an 44 y.o. female.  HPI: This is a 44 year old female with a recent development of redness and infection in her right foot that she states started after a pedicure on August 30.  Recently having some fever and chills and feeling very poorly and presented to the emergency department for evaluation where decision was made for admission.  Past Medical History:  Diagnosis Date  . Diabetes mellitus without complication ([place])   . Seizures ([place]) 2005 Last sz   childhood febrile sz, then ?eclampsia    Past Surgical History:  Procedure Laterality Date  . CESAREAN SECTION    . CESAREAN SECTION N/A 03/02/2013   Procedure: CESAREAN SECTION;  Surgeon: Florian Buff, MD;  Location: [facility];  Service: Obstetrics;  Laterality: N/A;  Repeat Cesarean Section Delivery Nonviable Baby Girl @ 2158    Family History  Problem Relation Age of Onset  . Diabetes Mother   . Diabetes Father     Social History:  reports that she has been smoking. She has never used smokeless tobacco. She reports that she does not drink alcohol and does not use drugs.  Allergies:  Allergies  Allergen Reactions  . Dilantin [Phenytoin Sodium Extended] Other (See Comments)    Causes seizures  . Phenobarbital Other (See Comments)    Causes seizures Other reaction(s): Unknown Causes seizures  . Phenytoin Sodium Extended     Other reaction(s): Other (See Comments) Causes seizures  . Aspartame Diarrhea and Nausea And Vomiting  . Aspartame And Phenylalanine Diarrhea and Nausea And Vomiting  . Carbamazepine Other (See Comments)    Other reaction(s): Unknown  . Gabapentin Other (See Comments)    Other reaction(s): Unknown Feels like she is going to have a seizure  . Ciprofloxacin Rash    blisters  . Humalog [Insulin Lispro] Rash  . Lantus [Insulin Glargine] Rash  . Novolin 70-30 [Insulin Nph Isophane & Regular] Itching and  Rash  . Penicillins Itching and Rash    Medications:  Scheduled: . atorvastatin  40 mg Oral QHS  . calcium-vitamin D  1 tablet Oral Q breakfast  . cholecalciferol  1,000 Units Oral Daily  . DULoxetine  30 mg Oral BID  . enoxaparin (LOVENOX) injection  40 mg Subcutaneous Q24H  . insulin aspart  0-5 Units Subcutaneous QHS  . insulin aspart  0-9 Units Subcutaneous TID WC  . insulin detemir  10 Units Subcutaneous QHS  . linagliptin  5 mg Oral Daily  . lisinopril  2.5 mg Oral Daily  . terbinafine   Topical BID  . vitamin B-12  500 mcg Oral Daily  . zonisamide  100 mg Oral Daily    Results for orders placed or performed during the hospital encounter of 03/09/20 (from the past 48 hour(s))  CBC with Differential     Status: Abnormal   Collection Time: 03/08/20  7:25 PM  Result Value Ref Range   WBC 26.8 (H) 4.0 - 10.5 K/uL   RBC 3.76 (L) 3.87 - 5.11 MIL/uL   Hemoglobin 10.5 (L) 12.0 - 15.0 g/dL   HCT 30.8 (L) 36 - 46 %   MCV 81.9 80.0 - 100.0 fL   MCH 27.9 26.0 - 34.0 pg   MCHC 34.1 30.0 - 36.0 g/dL   RDW 12.4 11.5 - 15.5 %   Platelets 578 (H) 150 - 400 K/uL   nRBC 0.0 0.0 - 0.2 %   Neutrophils Relative %  86 %   Neutro Abs 22.9 (H) 1.7 - 7.7 K/uL   Lymphocytes Relative 7 %   Lymphs Abs 1.9 0.7 - 4.0 K/uL   Monocytes Relative 5 %   Monocytes Absolute 1.2 (H) 0 - 1 K/uL   Eosinophils Relative 1 %   Eosinophils Absolute 0.1 0 - 0 K/uL   Basophils Relative 0 %   Basophils Absolute 0.1 0 - 0 K/uL   Immature Granulocytes 1 %   Abs Immature Granulocytes 0.38 (H) 0.00 - 0.07 K/uL    Comment: Performed at [facility], [address], [place], [place] [address]  Comprehensive metabolic panel     Status: Abnormal   Collection Time: 03/08/20  7:25 PM  Result Value Ref Range   Sodium 130 (L) 135 - 145 mmol/L   Potassium 3.5 3.5 - 5.1 mmol/L   Chloride 93 (L) 98 - 111 mmol/L   CO2 25 22 - 32 mmol/L   Glucose, Bld 428 (H) 70 - 99 mg/dL    Comment: Glucose reference range  applies only to samples taken after fasting for at least 8 hours.   BUN 18 6 - 20 mg/dL   Creatinine, Ser 1.17 (H) 0.44 - 1.00 mg/dL   Calcium 8.5 (L) 8.9 - 10.3 mg/dL   Total Protein 8.4 (H) 6.5 - 8.1 g/dL   Albumin 2.4 (L) 3.5 - 5.0 g/dL   AST 11 (L) 15 - 41 U/L   ALT 10 0 - 44 U/L   Alkaline Phosphatase 122 38 - 126 U/L   Total Bilirubin 0.7 0.3 - 1.2 mg/dL   GFR calc non Af Amer 57 (L) >60 mL/min   GFR calc Af Amer >60 >60 mL/min   Anion gap 12 5 - 15    Comment: Performed at [facility], [address] [place], [place] [address]  Lactic acid, plasma     Status: None   Collection Time: 03/08/20  7:25 PM  Result Value Ref Range   Lactic Acid, Venous 1.3 0.5 - 1.9 mmol/L    Comment: Performed at [facility], [address] [place], [place] [address]  Blood culture (routine x 2)     Status: None (Preliminary result)   Collection Time: 03/08/20  7:26 PM   Specimen: BLOOD  Result Value Ref Range   Specimen Description BLOOD RIGHT ANTECUBITAL    Special Requests      BOTTLES DRAWN AEROBIC AND ANAEROBIC Blood Culture results may not be optimal due to an excessive volume of blood received in culture bottles   Culture      NO GROWTH < 12 HOURS Performed at [facility] [address] [place], [place] [address]    Report Status PENDING   Blood culture (routine x 2)     Status: None (Preliminary result)   Collection Time: 03/09/20 12:55 AM   Specimen: BLOOD  Result Value Ref Range   Specimen Description BLOOD LEFT AC    Special Requests      BOTTLES DRAWN AEROBIC AND ANAEROBIC Blood Culture adequate volume   Culture      NO GROWTH < 12 HOURS Performed at [facility], [address] [place], [place] [address]    Report Status PENDING   SARS Coronavirus 2 by RT PCR (hospital order, performed in [facility] hospital lab) Nasopharyngeal Nasopharyngeal Swab     Status: None   Collection Time: 03/09/20 12:55 AM   Specimen: Nasopharyngeal  Swab  Result Value Ref Range  SARS Coronavirus 2 NEGATIVE NEGATIVE    Comment: (NOTE) SARS-CoV-2 target nucleic acids are NOT DETECTED.  The SARS-CoV-2 RNA is generally detectable in upper and lower respiratory specimens during the acute phase of infection. The lowest concentration of SARS-CoV-2 viral copies this assay can detect is 250 copies / mL. A negative result does not preclude SARS-CoV-2 infection and should not be used as the sole basis for treatment or other patient management decisions.  A negative result may occur with improper specimen collection / handling, submission of specimen other than nasopharyngeal swab, presence of viral mutation(s) within the areas targeted by this assay, and inadequate number of viral copies (<250 copies / mL). A negative result must be combined with clinical observations, patient history, and epidemiological information.  Fact Sheet for Patients:   StrictlyIdeas.no  Fact Sheet for Healthcare Providers: BankingDealers.co.za  This test is not yet approved or  cleared by the [place] FDA and has been authorized for detection and/or diagnosis of SARS-CoV-2 by FDA under an Emergency Use Authorization (EUA).  This EUA will remain in effect (meaning this test can be used) for the duration of the COVID-19 declaration under Section 564(b)(1) of the Act, 21 U.S.C. section 360bbb-3(b)(1), unless the authorization is terminated or revoked sooner.  Performed at [facility], [address] [place], [place] [address]   Glucose, capillary     Status: Abnormal   Collection Time: 03/09/20  2:33 AM  Result Value Ref Range   Glucose-Capillary 471 (H) 70 - 99 mg/dL    Comment: Glucose reference range applies only to samples taken after fasting for at least 8 hours.  Glucose, capillary     Status: Abnormal   Collection Time: 03/09/20  4:14 AM  Result Value Ref Range   Glucose-Capillary 453  (H) 70 - 99 mg/dL    Comment: Glucose reference range applies only to samples taken after fasting for at least 8 hours.  Glucose, capillary     Status: Abnormal   Collection Time: 03/09/20  5:10 AM  Result Value Ref Range   Glucose-Capillary 392 (H) 70 - 99 mg/dL    Comment: Glucose reference range applies only to samples taken after fasting for at least 8 hours.  HIV Antibody (routine testing w rflx)     Status: None   Collection Time: 03/09/20  5:14 AM  Result Value Ref Range   HIV Screen 4th Generation wRfx Non Reactive Non Reactive    Comment: Performed at [facility], [place] [address], [place], [place] [address]  Basic metabolic panel     Status: Abnormal   Collection Time: 03/09/20  5:14 AM  Result Value Ref Range   Sodium 129 (L) 135 - 145 mmol/L   Potassium 3.4 (L) 3.5 - 5.1 mmol/L   Chloride 96 (L) 98 - 111 mmol/L   CO2 24 22 - 32 mmol/L   Glucose, Bld 422 (H) 70 - 99 mg/dL    Comment: Glucose reference range applies only to samples taken after fasting for at least 8 hours.   BUN 17 6 - 20 mg/dL   Creatinine, Ser 1.09 (H) 0.44 - 1.00 mg/dL   Calcium 7.8 (L) 8.9 - 10.3 mg/dL   GFR calc non Af Amer >60 >60 mL/min   GFR calc Af Amer >60 >60 mL/min   Anion gap 9 5 - 15    Comment: Performed at [facility], [address] [place], [place] [address]  CBC     Status: Abnormal  Collection Time: 03/09/20  5:14 AM  Result Value Ref Range   WBC 24.0 (H) 4.0 - 10.5 K/uL   RBC 3.09 (L) 3.87 - 5.11 MIL/uL   Hemoglobin 8.7 (L) 12.0 - 15.0 g/dL   HCT 25.4 (L) 36 - 46 %   MCV 82.2 80.0 - 100.0 fL   MCH 28.2 26.0 - 34.0 pg   MCHC 34.3 30.0 - 36.0 g/dL   RDW 12.4 11.5 - 15.5 %   Platelets 432 (H) 150 - 400 K/uL   nRBC 0.0 0.0 - 0.2 %    Comment: Performed at [facility], [address] [place], [place] [address]  Hemoglobin A1c     Status: Abnormal   Collection Time: 03/09/20  5:14 AM  Result Value Ref Range   Hgb A1c MFr Bld 9.7 (H) 4.8 - 5.6 %     Comment: (NOTE) Pre diabetes:          5.7%-6.4%  Diabetes:              >6.4%  Glycemic control for   <7.0% adults with diabetes    Mean Plasma Glucose 231.69 mg/dL    Comment: Performed at [facility], [place] [address] [place], [place] [address]  Glucose, capillary     Status: Abnormal   Collection Time: 03/09/20  7:22 AM  Result Value Ref Range   Glucose-Capillary 125 (H) 70 - 99 mg/dL    Comment: Glucose reference range applies only to samples taken after fasting for at least 8 hours.  Glucose, capillary     Status: Abnormal   Collection Time: 03/09/20  9:30 AM  Result Value Ref Range   Glucose-Capillary 59 (L) 70 - 99 mg/dL    Comment: Glucose reference range applies only to samples taken after fasting for at least 8 hours.  Glucose, capillary     Status: Abnormal   Collection Time: 03/09/20 10:54 AM  Result Value Ref Range   Glucose-Capillary 109 (H) 70 - 99 mg/dL    Comment: Glucose reference range applies only to samples taken after fasting for at least 8 hours.  Glucose, capillary     Status: Abnormal   Collection Time: 03/09/20 12:02 PM  Result Value Ref Range   Glucose-Capillary 101 (H) 70 - 99 mg/dL    Comment: Glucose reference range applies only to samples taken after fasting for at least 8 hours.    US ARTERIAL ABI (SCREENING LOWER EXTREMITY)  Result Date: 03/09/2020 CLINICAL DATA:  44 year old female with a history of toe necrosis EXAM: NONINVASIVE PHYSIOLOGIC VASCULAR STUDY OF BILATERAL LOWER EXTREMITIES TECHNIQUE: Evaluation of both lower extremities was performed at rest, including calculation of ankle-brachial indices, multiple segmental pressure evaluation, segmental Doppler and segmental pulse volume recording. COMPARISON:  None. FINDINGS: Right ABI:  1.52 Left ABI:  1.05 Right Lower Extremity: Segmental Doppler on the right lower extremity demonstrates biphasic posterior tibial artery and monophasic dorsalis pedis. Left Lower Extremity: Segmental Doppler  of the left lower extremity demonstrates triphasic dorsalis pedis and biphasic posterior tibial artery. IMPRESSION: Right: Resting ABI of the right lower extremity likely falsely elevated, potentially from noncompressible vessels. Segmental Doppler at the right ankle demonstrates evidence of developing occlusive disease, though waveforms maintained. Left: Resting ABI within normal limits. Segmental Doppler at the left ankle demonstrates evidence of development occlusive disease, with waveforms relatively maintained. Signed, Dulcy Fanny. Dellia Nims, [facility] Electronically Signed   By: Corrie Mckusick D.O.  On: 03/09/2020 14:36   DG Foot Complete Right  Result Date: 03/08/2020 CLINICAL DATA:  Foot infection EXAM: RIGHT FOOT COMPLETE - 3+ VIEW COMPARISON:  11/29/2018 FINDINGS: No fracture or malalignment. Vascular calcifications. Small plantar calcaneal spur. Joint spaces are maintained. IMPRESSION: No acute osseous abnormality. Electronically Signed   By: Donavan Foil M.D.   On: 03/08/2020 19:49    Review of Systems  Constitutional: Negative for chills and fever.  HENT: Negative for sinus pain and sore throat.   Respiratory: Negative for cough and shortness of breath.   Cardiovascular: Negative for chest pain and palpitations.  Gastrointestinal: Positive for nausea. Negative for vomiting.  Endocrine: Negative for polyphagia and polyuria.  Genitourinary: Negative for frequency and hematuria.  Musculoskeletal:       Relates some pain with her right foot.  Denies injury.  Skin:       Redness and swelling with some drainage from her right foot.  States this started August 30.  Neurological:       Does not specifically relate numbness or paresthesias associated with her diabetes.  Psychiatric/Behavioral: Negative for confusion. The patient is not nervous/anxious.    Blood pressure 114/68, pulse (!) 103, temperature 99.7 F (37.6 C),  temperature source Oral, resp. rate 20, height 5\' 4"  (1.626 m), weight 75.8 kg, SpO2 98 %, unknown if currently breastfeeding. Physical Exam Cardiovascular:     Comments: DP and PT pulses on the right foot are thready and barely palpable.  DP pulse on the left foot 2/4 with thready PT pulse. Musculoskeletal:     Comments: Guarded range of motion in the right foot.  Muscle testing deferred.  Skin:    Comments: Significant erythema and edema with some ecchymotic appearance in the entire right foot and especially the forefoot.  Some early gangrenous changes at the distal tip of the right fifth toe with some skin slough in the fourth interspace with an underlying full-thickness ulceration on the medial aspect of the fifth toe probing down towards the level of bone.  Ulceration measures approximately 1.5 cm x 8 to 10 mm with some purulent discharge.  A plantar blister type abscess is noted in the right foot with some cellulitis and erythema from the forefoot streaking towards the medial arch.  Upon debridement there is an underlying ulceration with mostly serous abscess with post debridement measurement approximately 2.5 cm x 1.5 cm with a granular base.  Neurological:     Comments: Protective threshold with a monofilament wire appears to be grossly intact and symmetric to the digits.     Assessment/Plan: Assessment: Full-thickness ulceration with abscess and early gangrenous changes right fifth toe. 2.  Superficial abscess with underlying superficial ulceration right arch. 3.  Diabetes with possibly some degree of vascular compromise. 4.  Possible abscess in the right forefoot due to clinical presentation.  Plan: Debrided devitalized tissue from the ulceration on the right fifth toe sharply using tissue nippers including the epidermal and dermal layers as well as the deeper subcutaneous fibrotic infected area.  Debrided devitalized tissue from the plantar ulceration sharply using tissue nippers  including the epidermal layer.  Sterile bandages applied to both of these areas.  Discussed with the patient the need to obtain an MRI to evaluate for deeper abscess or possible bone involvement.  Discussed that she is most likely going to need some type of I&D for the infection in her right foot and is at significant risk for amputation of the right fifth toe.  Also will  await for vascular input.  Plan for MRI this evening or tomorrow and evaluate accordingly.  Durward Fortes 03/09/2020, 5:28 PM

## 2020-03-09 NOTE — Progress Notes (Addendum)
Inpatient Diabetes Program Recommendations  AACE/ADA: New Consensus Statement on Inpatient Glycemic Control (2015)  Target Ranges:  Prepandial:   less than 140 mg/dL      Peak postprandial:   less than 180 mg/dL (1-2 hours)      Critically ill patients:  140 - 180 mg/dL   Lab Results  Component Value Date   GLUCAP 59 (L) 03/09/2020   HGBA1C 9.2 (H) 03/03/2013    Review of Glycemic Control Results for Kristy Mcmillan, Kristy Mcmillan (MRN [id number]) as of 03/09/2020 09:53  Ref. Range 03/09/2020 02:33 03/09/2020 04:14 03/09/2020 05:10 03/09/2020 07:22 03/09/2020 09:30  Glucose-Capillary Latest Ref Range: 70 - 99 mg/dL 471 (H) Novolog 20 units 453 (H) Novolog 20 units 392 (H) Novolog 20 units 125 (H) 59 (L)   Diabetes history: DM Outpatient Diabetes medications: Levemir 10 units + Novolog 6 units tid meal coverage + Tradjenta 5 mg Current orders for Inpatient glycemic control: Levemir 10 units + Novolog resistant 0-20 units q 4 hrs. + Tradjenta 5 mg qd  Inpatient Diabetes Program Recommendations:   -Adjust Novolog correction to sensitive tid with meals + hs 0-5 units Noted hypoglycemia after received Novolog 60 units total given  Secure chat to RN Caesar Chestnut to check on CBG post hypoglycemia.  Thank you, Nani Gasser. Hajira Verhagen, RN, MSN, CDE  Diabetes Coordinator Inpatient Glycemic Control Team Team Pager [phone] (8am-5pm) 03/09/2020 9:58 AM

## 2020-03-09 NOTE — ED Notes (Signed)
retruned from ultrasound

## 2020-03-10 ENCOUNTER — Inpatient Hospital Stay: Payer: Medicare Other | Admitting: Certified Registered"

## 2020-03-10 ENCOUNTER — Inpatient Hospital Stay: Payer: Medicare Other

## 2020-03-10 ENCOUNTER — Encounter
Admission: EM | Disposition: A | Discharge status: Home / Self Care | Payer: Self-pay | Source: Home / Self Care | Attending: Internal Medicine

## 2020-03-10 DIAGNOSIS — E876 Hypokalemia: Secondary | ICD-10-CM

## 2020-03-10 HISTORY — PX: AMPUTATION TOE: SHX6595

## 2020-03-10 LAB — COMPREHENSIVE METABOLIC PANEL
ALT: 9 U/L (ref 0–44)
AST: 12 U/L — ABNORMAL LOW (ref 15–41)
Albumin: 1.8 g/dL — ABNORMAL LOW (ref 3.5–5.0)
Alkaline Phosphatase: 113 U/L (ref 38–126)
Anion gap: 12 (ref 5–15)
BUN: 14 mg/dL (ref 6–20)
CO2: 20 mmol/L — ABNORMAL LOW (ref 22–32)
Calcium: 8 mg/dL — ABNORMAL LOW (ref 8.9–10.3)
Chloride: 100 mmol/L (ref 98–111)
Creatinine, Ser: 0.75 mg/dL (ref 0.44–1.00)
GFR calc Af Amer: >60 mL/min (ref >60)
GFR calc non Af Amer: >60 mL/min (ref >60)
Glucose, Bld: 188 mg/dL — ABNORMAL HIGH (ref 70–99)
Potassium: 3.6 mmol/L (ref 3.5–5.1)
Sodium: 132 mmol/L — ABNORMAL LOW (ref 135–145)
Total Bilirubin: 0.9 mg/dL (ref 0.3–1.2)
Total Protein: 6.3 g/dL — ABNORMAL LOW (ref 6.5–8.1)

## 2020-03-10 LAB — GLUCOSE, CAPILLARY
Glucose-Capillary: 191 mg/dL — ABNORMAL HIGH (ref 70–99)
Glucose-Capillary: 160 mg/dL — ABNORMAL HIGH (ref 70–99)
Glucose-Capillary: 194 mg/dL — ABNORMAL HIGH (ref 70–99)
Glucose-Capillary: 173 mg/dL — ABNORMAL HIGH (ref 70–99)
Glucose-Capillary: 139 mg/dL — ABNORMAL HIGH (ref 70–99)
Glucose-Capillary: 138 mg/dL — ABNORMAL HIGH (ref 70–99)

## 2020-03-10 LAB — CBC WITH DIFFERENTIAL/PLATELET
Abs Immature Granulocytes: 0.66 10*3/uL — ABNORMAL HIGH (ref 0.00–0.07)
Basophils Absolute: 0.1 10*3/uL (ref 0.0–0.1)
Basophils Relative: 0 %
Eosinophils Absolute: 0.2 10*3/uL (ref 0.0–0.5)
Eosinophils Relative: 1 %
HCT: 24.3 % — ABNORMAL LOW (ref 36.0–46.0)
Hemoglobin: 8.3 g/dL — ABNORMAL LOW (ref 12.0–15.0)
Immature Granulocytes: 3 %
Lymphocytes Relative: 10 %
Lymphs Abs: 2.6 10*3/uL (ref 0.7–4.0)
MCH: 27.9 pg (ref 26.0–34.0)
MCHC: 34.2 g/dL (ref 30.0–36.0)
MCV: 81.8 fL (ref 80.0–100.0)
Monocytes Absolute: 1.5 10*3/uL — ABNORMAL HIGH (ref 0.1–1.0)
Monocytes Relative: 6 %
Neutro Abs: 20.5 10*3/uL — ABNORMAL HIGH (ref 1.7–7.7)
Neutrophils Relative %: 80 %
Platelets: 454 10*3/uL — ABNORMAL HIGH (ref 150–400)
RBC: 2.97 MIL/uL — ABNORMAL LOW (ref 3.87–5.11)
RDW: 12.6 % (ref 11.5–15.5)
Smear Review: NORMAL
WBC: 25.6 10*3/uL — ABNORMAL HIGH (ref 4.0–10.5)
nRBC: 0 % (ref 0.0–0.2)

## 2020-03-10 LAB — MAGNESIUM: Magnesium: 1.5 mg/dL — ABNORMAL LOW (ref 1.7–2.4)

## 2020-03-10 LAB — PHOSPHORUS: Phosphorus: 3 mg/dL (ref 2.5–4.6)

## 2020-03-10 SURGERY — AMPUTATION, TOE
Anesthesia: General | Site: Toe | Laterality: Right

## 2020-03-10 MED ORDER — FENTANYL CITRATE (PF) 100 MCG/2ML IJ SOLN: 25.0000 ug | INTRAMUSCULAR | Status: DC | PRN

## 2020-03-10 MED ORDER — BUPIVACAINE HCL 0.5 % IJ SOLN
INTRAMUSCULAR | Status: DC | PRN
  Administered 2020-03-10: 20 mL

## 2020-03-10 MED ORDER — ONDANSETRON HCL 4 MG/2ML IJ SOLN
INTRAMUSCULAR | Status: DC | PRN
  Administered 2020-03-10: 4 mg via INTRAVENOUS

## 2020-03-10 MED ORDER — FENTANYL CITRATE (PF) 100 MCG/2ML IJ SOLN
INTRAMUSCULAR | Status: DC | PRN
Start: 2020-03-10 — End: 2020-03-10
  Administered 2020-03-10 (×4): 25 ug via INTRAVENOUS

## 2020-03-10 MED ORDER — MAGNESIUM SULFATE 2 GM/50ML IV SOLN
2.0000 g | Freq: Once | INTRAVENOUS | Status: AC
  Administered 2020-03-10: 2 g via INTRAVENOUS
  Filled 2020-03-10: qty 50

## 2020-03-10 MED ORDER — SODIUM CHLORIDE 0.9 % IV SOLN: INTRAVENOUS | Status: AC

## 2020-03-10 MED ORDER — PHENYLEPHRINE HCL (PRESSORS) 10 MG/ML IV SOLN
INTRAVENOUS | Status: DC | PRN
  Administered 2020-03-10 (×4): 100 ug via INTRAVENOUS

## 2020-03-10 MED ORDER — MORPHINE SULFATE (PF) 2 MG/ML IV SOLN
2.0000 mg | INTRAVENOUS | Status: DC | PRN
  Administered 2020-03-10 – 2020-03-12 (×7): 2 mg via INTRAVENOUS
  Filled 2020-03-10 (×8): qty 1

## 2020-03-10 MED ORDER — OXYCODONE-ACETAMINOPHEN 5-325 MG PO TABS
1.0000 | ORAL_TABLET | ORAL | Status: DC | PRN
  Administered 2020-03-10 – 2020-03-11 (×3): 2 via ORAL
  Administered 2020-03-12: 1 via ORAL
  Administered 2020-03-13 (×2): 2 via ORAL
  Administered 2020-03-14 – 2020-03-16 (×3): 1 via ORAL
  Administered 2020-03-17: 2 via ORAL
  Administered 2020-03-17: 1 via ORAL
  Filled 2020-03-10 (×5): qty 2
  Filled 2020-03-10 (×3): qty 1
  Filled 2020-03-10 (×3): qty 2

## 2020-03-10 MED ORDER — POTASSIUM CHLORIDE 20 MEQ PO PACK
40.0000 meq | PACK | Freq: Two times a day (BID) | ORAL | Status: AC
  Administered 2020-03-10: 40 meq via ORAL
  Filled 2020-03-10: qty 2

## 2020-03-10 MED ORDER — VANCOMYCIN HCL 1000 MG IV SOLR
INTRAVENOUS | Status: DC | PRN
  Administered 2020-03-10: 1000 mg

## 2020-03-10 MED ORDER — FENTANYL CITRATE (PF) 100 MCG/2ML IJ SOLN
INTRAMUSCULAR | Status: AC
  Filled 2020-03-10: qty 2

## 2020-03-10 MED ORDER — PROPOFOL 10 MG/ML IV BOLUS
INTRAVENOUS | Status: DC | PRN
  Administered 2020-03-10: 100 mg via INTRAVENOUS

## 2020-03-10 MED ORDER — LACTATED RINGERS IV SOLN: INTRAVENOUS | Status: DC | PRN

## 2020-03-10 MED ORDER — ONDANSETRON HCL 4 MG/2ML IJ SOLN: 4.0000 mg | Freq: Once | INTRAMUSCULAR | Status: DC | PRN

## 2020-03-10 MED ORDER — LIDOCAINE HCL (CARDIAC) PF 100 MG/5ML IV SOSY
PREFILLED_SYRINGE | INTRAVENOUS | Status: DC | PRN
  Administered 2020-03-10: 80 mg via INTRAVENOUS

## 2020-03-10 SURGICAL SUPPLY — 48 items
BLADE MED AGGRESSIVE (BLADE) ×2 IMPLANT
BLADE OSC/SAGITTAL MD 5.5X18 (BLADE) ×2 IMPLANT
BLADE SURG 15 STRL LF DISP TIS (BLADE) ×2 IMPLANT
BLADE SURG 15 STRL SS (BLADE) ×2
BLADE SURG MINI STRL (BLADE) ×2 IMPLANT
BNDG CONFORM 2 STRL LF (GAUZE/BANDAGES/DRESSINGS) ×2 IMPLANT
BNDG ELASTIC 4X5.8 VLCR STR LF (GAUZE/BANDAGES/DRESSINGS) ×2 IMPLANT
BNDG ESMARK 4X12 TAN STRL LF (GAUZE/BANDAGES/DRESSINGS) ×2 IMPLANT
BNDG GAUZE 4.5X4.1 6PLY STRL (MISCELLANEOUS) ×2 IMPLANT
CANISTER SUCT 1200ML W/VALVE (MISCELLANEOUS) ×2 IMPLANT
COVER WAND RF STERILE (DRAPES) ×2 IMPLANT
CUFF TOURN 18 STER (MISCELLANEOUS) ×2 IMPLANT
CUFF TOURN DUAL PL 12 NO SLV (MISCELLANEOUS) ×2 IMPLANT
DRAPE FLUOR MINI C-ARM 54X84 (DRAPES) ×2 IMPLANT
DURAPREP 26ML APPLICATOR (WOUND CARE) ×2 IMPLANT
ELECT REM PT RETURN 9FT ADLT (ELECTROSURGICAL) ×2
ELECTRODE REM PT RTRN 9FT ADLT (ELECTROSURGICAL) ×1 IMPLANT
GAUZE SPONGE 4X4 12PLY STRL (GAUZE/BANDAGES/DRESSINGS) ×2 IMPLANT
GAUZE XEROFORM 1X8 LF (GAUZE/BANDAGES/DRESSINGS) ×2 IMPLANT
GLOVE BIO SURGEON STRL SZ7.5 (GLOVE) ×2 IMPLANT
GLOVE INDICATOR 8.0 STRL GRN (GLOVE) ×2 IMPLANT
GOWN STRL REUS W/ TWL LRG LVL3 (GOWN DISPOSABLE) ×2 IMPLANT
GOWN STRL REUS W/TWL LRG LVL3 (GOWN DISPOSABLE) ×2
HANDPIECE VERSAJET DEBRIDEMENT (MISCELLANEOUS) ×2 IMPLANT
KIT STIMULAN RAPID CURE 5CC (Orthopedic Implant) ×1 IMPLANT
KIT TURNOVER KIT A (KITS) ×2 IMPLANT
LABEL OR SOLS (LABEL) ×2 IMPLANT
NDL FILTER BLUNT 18X1 1/2 (NEEDLE) ×1 IMPLANT
NDL HYPO 25X1 1.5 SAFETY (NEEDLE) ×2 IMPLANT
NEEDLE FILTER BLUNT 18X 1/2SAF (NEEDLE) ×1
NEEDLE FILTER BLUNT 18X1 1/2 (NEEDLE) ×1 IMPLANT
NEEDLE HYPO 25X1 1.5 SAFETY (NEEDLE) ×4 IMPLANT
NS IRRIG 500ML POUR BTL (IV SOLUTION) ×2 IMPLANT
PACK EXTREMITY (MISCELLANEOUS) ×2 IMPLANT
SOL .9 NS 3000ML IRR  AL (IV SOLUTION) ×1
SOL .9 NS 3000ML IRR UROMATIC (IV SOLUTION) ×1 IMPLANT
SOL PREP PVP 2OZ (MISCELLANEOUS) ×2
SOLUTION PREP PVP 2OZ (MISCELLANEOUS) ×1 IMPLANT
STOCKINETTE BIAS CUT 4 980044 (GAUZE/BANDAGES/DRESSINGS) ×2 IMPLANT
STOCKINETTE STRL 6IN 960660 (GAUZE/BANDAGES/DRESSINGS) ×2 IMPLANT
STRIP CLOSURE SKIN 1/4X4 (GAUZE/BANDAGES/DRESSINGS) ×2 IMPLANT
SUT ETHILON 3-0 FS-10 30 BLK (SUTURE) ×2
SUT ETHILON 4-0 (SUTURE)
SUT ETHILON 4-0 FS2 18XMFL BLK (SUTURE)
SUTURE EHLN 3-0 FS-10 30 BLK (SUTURE) ×1 IMPLANT
SUTURE ETHLN 4-0 FS2 18XMF BLK (SUTURE) IMPLANT
SWAB DUAL CULTURE TRANS RED ST (MISCELLANEOUS) ×2 IMPLANT
SYR 10ML LL (SYRINGE) ×2 IMPLANT

## 2020-03-10 NOTE — Progress Notes (Signed)
Subjective/Chief Complaint: Patient seen.  Still complains of severe pain in her right foot.  She was able to have her MRI obtained after administering some pain medication.   Objective: Vital signs in last 24 hours: Temp:  [98 F (36.7 C)-101.5 F (38.6 C)] 98 F (36.7 C) (09/11 0743) Pulse Rate:  [93-110] 93 (09/11 0743) Resp:  [17-20] 17 (09/11 0743) BP: (109-122)/(59-68) 109/62 (09/11 0743) SpO2:  [94 %-98 %] 97 % (09/11 0743)    Intake/Output from previous day: 09/10 0701 - 09/11 0700 In: 671.6 [I.V.:400.7; IV Piggyback:270.9] Out: -  Intake/Output this shift: No intake/output data recorded.  There is some drainage on the bandaging from the fifth toe ulceration site as well as the plantar arch.  Palpable fluctuance with some early signs of skin necrosis dorsally from abscess.  MRI did show a large abscess on the dorsum of the foot as well as a smaller one plantarly.  Also suspected osteomyelitis or septic joint in the fifth metatarsal and toe.          Lab Results:  Recent Labs    03/09/20 0514 03/10/20 0458  WBC 24.0* 25.6*  HGB 8.7* 8.3*  HCT 25.4* 24.3*  PLT 432* 454*   BMET Recent Labs    03/09/20 0514 03/10/20 0458  NA 129* 132*  K 3.4* 3.6  CL 96* 100  CO2 24 20*  GLUCOSE 422* 188*  BUN 17 14  CREATININE 1.09* 0.75  CALCIUM 7.8* 8.0*   PT/INR No results for input(s): LABPROT, INR in the last 72 hours. ABG No results for input(s): PHART, HCO3 in the last 72 hours.  Invalid input(s): PCO2, PO2  Studies/Results: MR FOOT RIGHT WO CONTRAST  Result Date: 03/10/2020 CLINICAL DATA:  Right foot pain and swelling.  Diabetic. EXAM: MRI OF THE RIGHT FOREFOOT WITHOUT CONTRAST TECHNIQUE: Multiplanar, multisequence MR imaging of the right foot was performed. No intravenous contrast was administered. COMPARISON:  Radiographs 03/08/2020 FINDINGS: Exam limited by a patient motion and lack of IV contrast. Severe diffuse subcutaneous soft tissue  swelling/edema/fluid mainly involving the dorsum of the foot. I do not see any increased T1 signal intensity to suggest hemorrhage/hematoma. More focal appearance of the fluid signal intensity on the dorsum of the foot is suspicious for an abscess but difficult to be certain without contrast. It measures approximately 5 x 4 x 2 cm. There is also a more focal fluid collection along the plantar aspect of the fifth toe and also along the plantar aspect of the foot overlying the fourth metatarsal region. These could be abscesses also. There is T1 and T2 signal abnormality noted in the fifth metatarsal head and fifth proximal phalanx worrisome for osteomyelitis. Could not exclude septic arthritis at the fifth MTP joint. The surrounding fluid collections are suspicious for abscesses. Diffuse myofasciitis without obvious pyomyositis. The midfoot bony structures are intact. No findings suspicious for osteomyelitis. IMPRESSION: 1. Exam significantly limited by patient motion and lack of contrast. 2. 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 3. Suspect large dorsal and smaller plantar forefoot abscesses as detailed above. 4. Diffuse myofasciitis without obvious pyomyositis. Electronically Signed   By: Marijo Sanes M.D.   On: 03/10/2020 11:58   US ARTERIAL ABI (SCREENING LOWER EXTREMITY)  Result Date: 03/09/2020 CLINICAL DATA:  44 year old female with a history of toe necrosis EXAM: NONINVASIVE PHYSIOLOGIC VASCULAR STUDY OF BILATERAL LOWER EXTREMITIES TECHNIQUE: Evaluation of both lower extremities  was performed at rest, including calculation of ankle-brachial indices, multiple segmental pressure evaluation, segmental Doppler and segmental pulse volume recording. COMPARISON:  None. FINDINGS: Right ABI:  1.52 Left ABI:  1.05 Right Lower Extremity: Segmental Doppler on the right lower extremity demonstrates biphasic  posterior tibial artery and monophasic dorsalis pedis. Left Lower Extremity: Segmental Doppler of the left lower extremity demonstrates triphasic dorsalis pedis and biphasic posterior tibial artery. IMPRESSION: Right: Resting ABI of the right lower extremity likely falsely elevated, potentially from noncompressible vessels. Segmental Doppler at the right ankle demonstrates evidence of developing occlusive disease, though waveforms maintained. Left: Resting ABI within normal limits. Segmental Doppler at the left ankle demonstrates evidence of development occlusive disease, with waveforms relatively maintained. Signed, Dulcy Fanny. Dellia Nims, RPVI [facility] Electronically Signed   By: Corrie Mckusick D.O.   On: 03/09/2020 14:36   DG Foot Complete Right  Result Date: 03/08/2020 CLINICAL DATA:  Foot infection EXAM: RIGHT FOOT COMPLETE - 3+ VIEW COMPARISON:  11/29/2018 FINDINGS: No fracture or malalignment. Vascular calcifications. Small plantar calcaneal spur. Joint spaces are maintained. IMPRESSION: No acute osseous abnormality. Electronically Signed   By: Donavan Foil M.D.   On: 03/08/2020 19:49    Anti-infectives: Anti-infectives (From admission, onward)   Start     Dose/Rate Route Frequency Ordered Stop   03/09/20 1400  vancomycin (VANCOCIN) IVPB 1000 mg/200 mL premix        1,000 mg 200 mL/hr over 60 Minutes Intravenous Every 12 hours 03/09/20 0301     03/09/20 0900  ceFEPIme (MAXIPIME) 2 g in sodium chloride 0.9 % 100 mL IVPB        2 g 200 mL/hr over 30 Minutes Intravenous Every 8 hours 03/09/20 0144     03/09/20 0200  vancomycin (VANCOREADY) IVPB 500 mg/100 mL        500 mg 100 mL/hr over 60 Minutes Intravenous  Once 03/09/20 0149 03/09/20 0437   03/09/20 0145  vancomycin (VANCOCIN) IVPB 1000 mg/200 mL premix  Status:  Discontinued        1,000 mg 200 mL/hr over 60 Minutes Intravenous  Once 03/09/20 0144 03/09/20 0149   03/09/20 0030   ceFEPIme (MAXIPIME) 2 g in sodium chloride 0.9 % 100 mL IVPB        2 g 200 mL/hr over 30 Minutes Intravenous  Once 03/09/20 0019 03/09/20 0133   03/09/20 0030  metroNIDAZOLE (FLAGYL) IVPB 500 mg        500 mg 100 mL/hr over 60 Minutes Intravenous  Once 03/09/20 0019 03/09/20 0214   03/09/20 0030  vancomycin (VANCOCIN) IVPB 1000 mg/200 mL premix        1,000 mg 200 mL/hr over 60 Minutes Intravenous  Once 03/09/20 0019 03/09/20 0332      Assessment/Plan: s/p * No surgery found * Assessment: Abscess multiple areas right foot with osteomyelitis and or septic joint.   Plan: Discussed with the patient the need for amputation of her right fifth toe with partial ray resection as well as I&D of all the abscessed areas.  Patient states that she last ate some fruit this morning around 9:15 AM.  We will make the patient n.p.o. now.  Obtain consent for fifth ray resection right foot with I&D multiple sites right foot.  Discussed with the patient risks and complications of the procedure including but not limited to continued or progressive infection which may require further surgery.  Also discussed the risk for amputation if the infection  cannot be controlled.  No guarantees could be given as to the outcome of surgery.  Patient elects to proceed with surgery later this afternoon.  LOS: 1 day    Durward Fortes 03/10/2020

## 2020-03-10 NOTE — H&P (View-Only) (Signed)
Subjective/Chief Complaint: Patient seen.  Still complains of severe pain in her right foot.  She was able to have her MRI obtained after administering some pain medication.   Objective: Vital signs in last 24 hours: Temp:  [98 F (36.7 C)-101.5 F (38.6 C)] 98 F (36.7 C) (09/11 0743) Pulse Rate:  [93-110] 93 (09/11 0743) Resp:  [17-20] 17 (09/11 0743) BP: (109-122)/(59-68) 109/62 (09/11 0743) SpO2:  [94 %-98 %] 97 % (09/11 0743)    Intake/Output from previous day: 09/10 0701 - 09/11 0700 In: 671.6 [I.V.:400.7; IV Piggyback:270.9] Out: -  Intake/Output this shift: No intake/output data recorded.  There is some drainage on the bandaging from the fifth toe ulceration site as well as the plantar arch.  Palpable fluctuance with some early signs of skin necrosis dorsally from abscess.  MRI did show a large abscess on the dorsum of the foot as well as a smaller one plantarly.  Also suspected osteomyelitis or septic joint in the fifth metatarsal and toe.          Lab Results:  Recent Labs    03/09/20 0514 03/10/20 0458  WBC 24.0* 25.6*  HGB 8.7* 8.3*  HCT 25.4* 24.3*  PLT 432* 454*   BMET Recent Labs    03/09/20 0514 03/10/20 0458  NA 129* 132*  K 3.4* 3.6  CL 96* 100  CO2 24 20*  GLUCOSE 422* 188*  BUN 17 14  CREATININE 1.09* 0.75  CALCIUM 7.8* 8.0*   PT/INR No results for input(s): LABPROT, INR in the last 72 hours. ABG No results for input(s): PHART, HCO3 in the last 72 hours.  Invalid input(s): PCO2, PO2  Studies/Results: MR FOOT RIGHT WO CONTRAST  Result Date: 03/10/2020 CLINICAL DATA:  Right foot pain and swelling.  Diabetic. EXAM: MRI OF THE RIGHT FOREFOOT WITHOUT CONTRAST TECHNIQUE: Multiplanar, multisequence MR imaging of the right foot was performed. No intravenous contrast was administered. COMPARISON:  Radiographs 03/08/2020 FINDINGS: Exam limited by a patient motion and lack of IV contrast. Severe diffuse subcutaneous soft tissue  swelling/edema/fluid mainly involving the dorsum of the foot. I do not see any increased T1 signal intensity to suggest hemorrhage/hematoma. More focal appearance of the fluid signal intensity on the dorsum of the foot is suspicious for an abscess but difficult to be certain without contrast. It measures approximately 5 x 4 x 2 cm. There is also a more focal fluid collection along the plantar aspect of the fifth toe and also along the plantar aspect of the foot overlying the fourth metatarsal region. These could be abscesses also. There is T1 and T2 signal abnormality noted in the fifth metatarsal head and fifth proximal phalanx worrisome for osteomyelitis. Could not exclude septic arthritis at the fifth MTP joint. The surrounding fluid collections are suspicious for abscesses. Diffuse myofasciitis without obvious pyomyositis. The midfoot bony structures are intact. No findings suspicious for osteomyelitis. IMPRESSION: 1. Exam significantly limited by patient motion and lack of contrast. 2. 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 3. Suspect large dorsal and smaller plantar forefoot abscesses as detailed above. 4. Diffuse myofasciitis without obvious pyomyositis. Electronically Signed   By: Marijo Sanes M.D.   On: 03/10/2020 11:58   US ARTERIAL ABI (SCREENING LOWER EXTREMITY)  Result Date: 03/09/2020 CLINICAL DATA:  44 year old female with a history of toe necrosis EXAM: NONINVASIVE PHYSIOLOGIC VASCULAR STUDY OF BILATERAL LOWER EXTREMITIES TECHNIQUE: Evaluation of both lower extremities  was performed at rest, including calculation of ankle-brachial indices, multiple segmental pressure evaluation, segmental Doppler and segmental pulse volume recording. COMPARISON:  None. FINDINGS: Right ABI:  1.52 Left ABI:  1.05 Right Lower Extremity: Segmental Doppler on the right lower extremity demonstrates biphasic  posterior tibial artery and monophasic dorsalis pedis. Left Lower Extremity: Segmental Doppler of the left lower extremity demonstrates triphasic dorsalis pedis and biphasic posterior tibial artery. IMPRESSION: Right: Resting ABI of the right lower extremity likely falsely elevated, potentially from noncompressible vessels. Segmental Doppler at the right ankle demonstrates evidence of developing occlusive disease, though waveforms maintained. Left: Resting ABI within normal limits. Segmental Doppler at the left ankle demonstrates evidence of development occlusive disease, with waveforms relatively maintained. Signed, Dulcy Fanny. Dellia Nims, RPVI [facility] Electronically Signed   By: Corrie Mckusick D.O.   On: 03/09/2020 14:36   DG Foot Complete Right  Result Date: 03/08/2020 CLINICAL DATA:  Foot infection EXAM: RIGHT FOOT COMPLETE - 3+ VIEW COMPARISON:  11/29/2018 FINDINGS: No fracture or malalignment. Vascular calcifications. Small plantar calcaneal spur. Joint spaces are maintained. IMPRESSION: No acute osseous abnormality. Electronically Signed   By: Donavan Foil M.D.   On: 03/08/2020 19:49    Anti-infectives: Anti-infectives (From admission, onward)   Start     Dose/Rate Route Frequency Ordered Stop   03/09/20 1400  vancomycin (VANCOCIN) IVPB 1000 mg/200 mL premix        1,000 mg 200 mL/hr over 60 Minutes Intravenous Every 12 hours 03/09/20 0301     03/09/20 0900  ceFEPIme (MAXIPIME) 2 g in sodium chloride 0.9 % 100 mL IVPB        2 g 200 mL/hr over 30 Minutes Intravenous Every 8 hours 03/09/20 0144     03/09/20 0200  vancomycin (VANCOREADY) IVPB 500 mg/100 mL        500 mg 100 mL/hr over 60 Minutes Intravenous  Once 03/09/20 0149 03/09/20 0437   03/09/20 0145  vancomycin (VANCOCIN) IVPB 1000 mg/200 mL premix  Status:  Discontinued        1,000 mg 200 mL/hr over 60 Minutes Intravenous  Once 03/09/20 0144 03/09/20 0149   03/09/20 0030   ceFEPIme (MAXIPIME) 2 g in sodium chloride 0.9 % 100 mL IVPB        2 g 200 mL/hr over 30 Minutes Intravenous  Once 03/09/20 0019 03/09/20 0133   03/09/20 0030  metroNIDAZOLE (FLAGYL) IVPB 500 mg        500 mg 100 mL/hr over 60 Minutes Intravenous  Once 03/09/20 0019 03/09/20 0214   03/09/20 0030  vancomycin (VANCOCIN) IVPB 1000 mg/200 mL premix        1,000 mg 200 mL/hr over 60 Minutes Intravenous  Once 03/09/20 0019 03/09/20 0332      Assessment/Plan: s/p * No surgery found * Assessment: Abscess multiple areas right foot with osteomyelitis and or septic joint.   Plan: Discussed with the patient the need for amputation of her right fifth toe with partial ray resection as well as I&D of all the abscessed areas.  Patient states that she last ate some fruit this morning around 9:15 AM.  We will make the patient n.p.o. now.  Obtain consent for fifth ray resection right foot with I&D multiple sites right foot.  Discussed with the patient risks and complications of the procedure including but not limited to continued or progressive infection which may require further surgery.  Also discussed the risk for amputation if the infection  cannot be controlled.  No guarantees could be given as to the outcome of surgery.  Patient elects to proceed with surgery later this afternoon.  LOS: 1 day    Durward Fortes 03/10/2020

## 2020-03-10 NOTE — Anesthesia Procedure Notes (Signed)
Procedure Name: LMA Insertion Date/Time: 03/10/2020 7:18 PM Performed by: Chanetta Marshall, CRNA Pre-anesthesia Checklist: Patient identified, Emergency Drugs available, Suction available and Patient being monitored Patient Re-evaluated:Patient Re-evaluated prior to induction Oxygen Delivery Method: Circle system utilized Preoxygenation: Pre-oxygenation with 100% oxygen Induction Type: IV induction Ventilation: Mask ventilation without difficulty LMA: LMA inserted LMA Size: 3.0 Tube type: Oral Number of attempts: 1 Placement Confirmation: positive ETCO2,  breath sounds checked- equal and bilateral and CO2 detector Tube secured with: Tape Dental Injury: Teeth and Oropharynx as per pre-operative assessment

## 2020-03-10 NOTE — Progress Notes (Signed)
PROGRESS NOTE    Kristy Mcmillan  NLG:[id number] DOB: 06-14-1976 DOA: 03/09/2020 [facility]CP: [facility] [place]  Brief Narrative:  The patient is a 44 year old overweight Caucasian female with a past medical history significant for but not limited to is mellitus type II, history of seizure disorder who presented to the emergency room with acute onset of worsening right foot swelling associated with erythema, pain and tenderness.  She had a pedicure on 02/27/2020 before symptoms and began having a fever over the last couple days associated with chills.  She denies any nausea or vomiting.  She is not vaccinated against COVID-19 disease and upon presentation she is found to be febrile with a tachycardia and a respiratory rate of 29.  Labs revealed borderline potassium of 2.5 and she did have a hyponatremia and hypochloremia and elevated blood sugar with a hyperglycemia of 428.  In the ED sepsis protocol was started and she is given acetaminophen, IV cefepime, Flagyl and IV vancomycin as well as morphine sulfate IV Zofran.  Her potassium was repleted as well.  She was admitted for sepsis secondary to right foot extremity cellulitis with right fifth toe necrosis in the setting of an uncontrolled diabetic foot.    **Interim History -She had bedside debridement today done by podiatry.  Still waiting for the MRI to be done.  Vascular surgery evaluated and considering an angiogram early next week.  Continues to still be septic and had a temperature, tachycardia and tachypnea overnight which is now improved.  She also started having some loose stools likely antibiotic mediated.  Assessment & Plan:   Active Problems:   Lower extremity cellulitis  Sepsis 2/2Right lower extremity/mainly foot severe cellulitis with skin sloughing ulceration with abscess and early gangrenous changes of the right fifth toe in the setting of uncontrolled type 2 diabetes mellitus and diabetic foot.  -The patient has subsequent  sepsis without severe sepsis or septic shock as manifested by significant leuko-cytosis, fever, tachycardia and tachypnea; overnight she is febrile again with a temperature of 101.5, tachycardic with a pulse rate of 110, and respiratory rate 21 -DG of the foot showed "No fracture or malalignment. Vascular calcifications. Small plantar calcaneal spur. Joint spaces are maintained." -The patient will be admitted to a medically monitored bed. -We will continue antibiotic therapy with IV cefepime and vancomycin.  We have stopped IV Flagyl and changed from IV Zosyn to IV cefepime -Wound care consult to be obtained. -Podiatry consult has been obtained and they request vascular surgery evaluation as well; podiatry evaluated and she does have a full-thickness ulceration with abscess and early gangrenous changes to the right fifth toe as well as superficial abscess with underlying superficial ulceration of the right arch and so podiatry debrided the devitalized tissue from the ulceration of the fifth toe and debrided the devitalized tissue from the plantar ulceration sharply using the nippers and have ordered an MRI of the foot.  Dr. Caryl Comes discussed with the patient that she is likely going to need some type of I&D for infection in her right foot is significantly at risk for losing her toe with a right fifth toe amputation -Vascular surgery has been consulted as well and I recommend following up on MRI and podiatry recommendations and await MRI results.  They are considering an angiogram early next week -We'll check ABIs and showed the following:  "Right:  Resting ABI of the right lower extremity likely falsely elevated, potentially from noncompressible vessels.  Segmental Doppler at the right ankle demonstrates  evidence of developing occlusive disease, though waveforms maintained.  Left:  Resting ABI within normal limits.  Segmental Doppler at the left ankle demonstrates evidence of development  occlusive disease, with waveforms relatively maintained."   -Podiatry consulted and recommending further imaging with an MRI which is still pending to be done today -Blood cultures x2 have been obtained and showed no growth to date 1 day -Given the likelihood of underlying tinea pedis that could be preceding bacterial superinfection, she was started n Lamisil cream twice daily. -She is both on lactated Ringer's as well as normal saline we discontinue lactated Ringer's 150 MLS per hour and continue normal saline at 100 mL's per hour for now and will reduce the rate to 75 MLS per hour for another 1 day -Continue to follow blood cultures and temperature curve and repeat CBC in a.m. -WBC on admission was 26.8 and was improved to 24.0 slightly yesterday and today is 25.6 -Continue supportive care and continue with antiemetics 4 mg p.o. every 6 as needed for nausea  Uncontrolled Hyperglycemia in the setting of type 2 diabetes mellitus -Patient hemoglobin A1c was 9.7 -The patient will be placed on resistant subcutaneous NovoLog with frequent fingerstick blood glucose measures in addition to hydration with IV normal saline however she was changed to sensitive NovoLog sliding scale before meals and at bedtime given her hypoglycemic episode -We will continue basal coverage Levemir 10 units subcu and p.o. Tradjenta. -CBGs ranging from 119-191  Essential Hypertension. -Continue with lisinopril 2.5 mg p.o. daily  Hyperlipidemia -Continue with Atorvastatin 40 g p.o. nightly  GERD. -PPI therapy will be resumed.  Hyponatremia/Hypochloremia -Mild patient's sodium went from 130 is now 129 yesterday; Now 132 -Patient's chloride level has gone from 93 -> 96 -> 100 -Continue with normal saline but reduce rate from 100 MLS per hour to 75 mL/hr x1 day -Continue monitor and trend and repeat CMP in a.m.  Loose Stools -In the setting of Antibiotics -WBC is elevated but likely from Severe  Cellulitis -Continue to Monitor and if worsens consider checking C Difficile and GI Pathogen Panel -Hold all Laxatives and if necessary Hold PPI  Hypokalemia -Patient's potassium this morning was 3.6 -Check Magnesium Level and was 1.5 -Replete p.o. KCl 40 mEQ BID x2 again -Continue to Monitor and Replete as Necessary -Repeat CMP in the AM  Thrombocytosis -Patient's platelet count on admission was 570 and is improved to 532 and is now 454 -Likely is Reactive in the setting of Infection -Continue monitor and trend and Repeat CBC in a.m.  Mild AKI -Patient's BUNs/creatinine on admission was 18/1.17 and improving and is now 14/0.75 -Baseline appears to be from 0.6-0.8 -Continue with IV fluid hydration as above and reduce rate from 100 mL/hr to 75 mL/hr -Avoid further nephrotoxic medications, contrast dyes, and hypotension and renally adjust medications  -Repeat CBC in a.m.  Normocytic Anemia -Patient's hemoglobin/hematocrit admission was 10.5/30.8 and is now dropped to 8.3/24.3 likely in the setting of a dilutional drop from IV fluids -Continue with IV fluids as above  -Check anemia panel in the morning -Continue to monitor for signs and symptoms of bleeding; currently no overt bleeding noted -Repeat CBC in the AM   DiabeticPeripheral Neuropathy. -Continue with Duloxetine 30 mg q. twice daily  History of Seizures  -Continue with Zonisamide 100 g p.o. daily  -Has had childhood febrile seizure and questionable eclampsia  Hypomagnesemia -Patient's magnesium level is 1.5 -Continue to replete with IV mag sulfate 2 g -Continue to monitor and replete  as necessary -Repeat Magnesium Level in the AM  DVT prophylaxis: Enoxaparin 40 mg sq q24h Code Status: FULL CODE  Family Communication: No family present at bedside Disposition Plan: Pending further evaluation by Orthopedic Surgery and Vascular Surgery  Status is: Inpatient  Remains inpatient appropriate because:Ongoing  diagnostic testing needed not appropriate for outpatient work up, IV treatments appropriate due to intensity of illness or inability to take PO and Inpatient level of care appropriate due to severity of illness   Dispo: The patient is from: Home              Anticipated d/c is to: TBD              Anticipated d/c date is: 2 days              Patient currently is not medically stable to d/c.  Consultants:   Podiatry  Vascular Surgery   Procedures:  ABI's IMPRESSION: Right:  Resting ABI of the right lower extremity likely falsely elevated, potentially from noncompressible vessels.  Segmental Doppler at the right ankle demonstrates evidence of developing occlusive disease, though waveforms maintained.  Left:  Resting ABI within normal limits.  Segmental Doppler at the left ankle demonstrates evidence of development occlusive disease, with waveforms relatively maintained.  Antimicrobials:  Anti-infectives (From admission, onward)   Start     Dose/Rate Route Frequency Ordered Stop   03/09/20 1400  vancomycin (VANCOCIN) IVPB 1000 mg/200 mL premix        1,000 mg 200 mL/hr over 60 Minutes Intravenous Every 12 hours 03/09/20 0301     03/09/20 0900  ceFEPIme (MAXIPIME) 2 g in sodium chloride 0.9 % 100 mL IVPB        2 g 200 mL/hr over 30 Minutes Intravenous Every 8 hours 03/09/20 0144     03/09/20 0200  vancomycin (VANCOREADY) IVPB 500 mg/100 mL        500 mg 100 mL/hr over 60 Minutes Intravenous  Once 03/09/20 0149 03/09/20 0437   03/09/20 0145  vancomycin (VANCOCIN) IVPB 1000 mg/200 mL premix  Status:  Discontinued        1,000 mg 200 mL/hr over 60 Minutes Intravenous  Once 03/09/20 0144 03/09/20 0149   03/09/20 0030  ceFEPIme (MAXIPIME) 2 g in sodium chloride 0.9 % 100 mL IVPB        2 g 200 mL/hr over 30 Minutes Intravenous  Once 03/09/20 0019 03/09/20 0133   03/09/20 0030  metroNIDAZOLE (FLAGYL) IVPB 500 mg        500 mg 100 mL/hr over 60 Minutes Intravenous   Once 03/09/20 0019 03/09/20 0214   03/09/20 0030  vancomycin (VANCOCIN) IVPB 1000 mg/200 mL premix        1,000 mg 200 mL/hr over 60 Minutes Intravenous  Once 03/09/20 0019 03/09/20 0332        Subjective: Seen and examined at bedside and states that she was having some diarrhea now.  No nausea or vomiting.  Denies any chest pain, lightheadedness or dizziness but states her foot still hurts.  Still does not feel very well.  No other concerns or complaints at this time.  Objective: Vitals:   03/09/20 2014 03/10/20 0006 03/10/20 0106 03/10/20 0743  BP: 118/66 120/60 (!) 122/59 109/62  Pulse: (!) 110 (!) 106 (!) 101 93  Resp: 18 17 18 17   Temp: 100.1 F (37.8 C) (!) 101.5 F (38.6 C) 99 F (37.2 C) 98 F (36.7 C)  TempSrc: Oral Oral Oral Oral  SpO2: 94% 94%  97%  Weight:      Height:        Intake/Output Summary (Last 24 hours) at 03/10/2020 0750 Last data filed at 03/10/2020 0400 Gross per 24 hour  Intake 671.62 ml  Output --  Net 671.62 ml   Filed Weights   03/08/20 1911  Weight: 75.8 kg   Examination: Physical Exam:  Constitutional: WN/WD overweight Caucasian female currently in NAD and appears calm but slightly uncomfortable Eyes: Lids and conjunctivae normal, sclerae anicteric  ENMT: External Ears, Nose appear normal. Grossly normal hearing.  Neck: Appears normal, supple, no cervical masses, normal ROM, no appreciable thyromegaly; no JVD Respiratory: Diminished to auscultation bilaterally, no wheezing, rales, rhonchi or crackles. Normal respiratory effort and patient is not tachypenic. No accessory muscle use.  Unlabored breathing Cardiovascular: RRR, no murmurs / rubs / gallops. S1 and S2 auscultated.  Has some lower extremity edema worse on the right compared to left Abdomen: Soft, non-tender, distended secondary body habitus. Bowel sounds positive.  GU: Deferred. Musculoskeletal: No clubbing / cyanosis of digits/nails. No joint deformity upper and lower  extremities. Skin: Right foot was wrapped today. No induration; Warm and dry.  Neurologic: CN 2-12 grossly intact with no focal deficits. Romberg sign and cerebellar reflexes not assessed.  Psychiatric: Normal judgment and insight. Alert and oriented x 3. Normal mood and appropriate affect.   Data Reviewed: I have personally reviewed following labs and imaging studies  CBC: Recent Labs  Lab 03/08/20 1925 03/09/20 0514 03/10/20 0458  WBC 26.8* 24.0* 25.6*  NEUTROABS 22.9*  --  20.5*  HGB 10.5* 8.7* 8.3*  HCT 30.8* 25.4* 24.3*  MCV 81.9 82.2 81.8  PLT 578* 432* 086*   Basic Metabolic Panel: Recent Labs  Lab 03/08/20 1925 03/09/20 0514 03/10/20 0458  NA 130* 129* 132*  K 3.5 3.4* 3.6  CL 93* 96* 100  CO2 25 24 20*  GLUCOSE 428* 422* 188*  BUN 18 17 14   CREATININE 1.17* 1.09* 0.75  CALCIUM 8.5* 7.8* 8.0*  MG  --   --  1.5*  PHOS  --   --  3.0   GFR: Estimated Creatinine Clearance: 89.4 mL/min (by C-G formula based on SCr of 0.75 mg/dL). Liver Function Tests: Recent Labs  Lab 03/08/20 1925 03/10/20 0458  AST 11* 12*  ALT 10 9  ALKPHOS 122 113  BILITOT 0.7 0.9  PROT 8.4* 6.3*  ALBUMIN 2.4* 1.8*   No results for input(s): LIPASE, AMYLASE in the last 168 hours. No results for input(s): AMMONIA in the last 168 hours. Coagulation Profile: No results for input(s): INR, PROTIME in the last 168 hours. Cardiac Enzymes: No results for input(s): CKTOTAL, CKMB, CKMBINDEX, TROPONINI in the last 168 hours. BNP (last 3 results) No results for input(s): PROBNP in the last 8760 hours. HbA1C: Recent Labs    03/09/20 0514  HGBA1C 9.7*   CBG: Recent Labs  Lab 03/09/20 1202 03/09/20 2016 03/09/20 2322 03/10/20 0508 03/10/20 0744  GLUCAP 101* 119* 121* 191* 160*   Lipid Profile: No results for input(s): CHOL, HDL, LDLCALC, TRIG, CHOLHDL, LDLDIRECT in the last 72 hours. Thyroid Function Tests: No results for input(s): TSH, T4TOTAL, FREET4, T3FREE, THYROIDAB in the  last 72 hours. Anemia Panel: No results for input(s): VITAMINB12, FOLATE, FERRITIN, TIBC, IRON, RETICCTPCT in the last 72 hours. Sepsis Labs: Recent Labs  Lab 03/08/20 1925  LATICACIDVEN 1.3    Recent Results (from the past 240 hour(s))  Blood culture (routine x 2)  Status: None (Preliminary result)   Collection Time: 03/08/20  7:26 PM   Specimen: BLOOD  Result Value Ref Range Status   Specimen Description BLOOD RIGHT ANTECUBITAL  Final   Special Requests   Final    BOTTLES DRAWN AEROBIC AND ANAEROBIC Blood Culture results may not be optimal due to an excessive volume of blood received in culture bottles   Culture   Final    NO GROWTH 2 DAYS Performed at [facility], [address] [place], [place] [address]    Report Status PENDING  Incomplete  Blood culture (routine x 2)     Status: None (Preliminary result)   Collection Time: 03/09/20 12:55 AM   Specimen: BLOOD  Result Value Ref Range Status   Specimen Description BLOOD LEFT [facility]  Final   Special Requests   Final    BOTTLES DRAWN AEROBIC AND ANAEROBIC Blood Culture adequate volume   Culture   Final    NO GROWTH 1 DAY Performed at [facility], [address], [place], [place] [address]    Report Status PENDING  Incomplete  SARS Coronavirus 2 by RT PCR (hospital order, performed in [facility] hospital lab) Nasopharyngeal Nasopharyngeal Swab     Status: None   Collection Time: 03/09/20 12:55 AM   Specimen: Nasopharyngeal Swab  Result Value Ref Range Status   SARS Coronavirus 2 NEGATIVE NEGATIVE Final    Comment: (NOTE) SARS-CoV-2 target nucleic acids are NOT DETECTED.  The SARS-CoV-2 RNA is generally detectable in upper and lower respiratory specimens during the acute phase of infection. The lowest concentration of SARS-CoV-2 viral copies this assay can detect is 250 copies / mL. A negative result does not preclude SARS-CoV-2 infection and should not be used as the sole basis for treatment or  other patient management decisions.  A negative result may occur with improper specimen collection / handling, submission of specimen other than nasopharyngeal swab, presence of viral mutation(s) within the areas targeted by this assay, and inadequate number of viral copies (<250 copies / mL). A negative result must be combined with clinical observations, patient history, and epidemiological information.  Fact Sheet for Patients:   StrictlyIdeas.no  Fact Sheet for Healthcare Providers: BankingDealers.co.za  This test is not yet approved or  cleared by the [place] FDA and has been authorized for detection and/or diagnosis of SARS-CoV-2 by FDA under an Emergency Use Authorization (EUA).  This EUA will remain in effect (meaning this test can be used) for the duration of the COVID-19 declaration under Section 564(b)(1) of the Act, 21 U.S.C. section 360bbb-3(b)(1), unless the authorization is terminated or revoked sooner.  Performed at [facility], [address] [place], [place] [address]   Aerobic/Anaerobic Culture (surgical/deep wound)     Status: None (Preliminary result)   Collection Time: 03/09/20  5:28 PM   Specimen: Wound; Abscess  Result Value Ref Range Status   Specimen Description WOUND RIGHT FOOT  Final   Special Requests NONE  Final   Gram Stain   Final    RARE WBC PRESENT, PREDOMINANTLY PMN NO ORGANISMS SEEN Performed at [facility], [address], [place], [place] [address]    Culture PENDING  Incomplete   Report Status PENDING  Incomplete     RN Pressure Injury Documentation:     Estimated body mass index is 28.67 kg/m as calculated from the following:   Height as of this encounter: 5\' 4"  (1.626 m).   Weight as of this encounter:  75.8 kg.  Malnutrition Type:      Malnutrition Characteristics:      Nutrition Interventions:    Radiology Studies: US ARTERIAL ABI (SCREENING  LOWER EXTREMITY)  Result Date: 03/09/2020 CLINICAL DATA:  45 year old female with a history of toe necrosis EXAM: NONINVASIVE PHYSIOLOGIC VASCULAR STUDY OF BILATERAL LOWER EXTREMITIES TECHNIQUE: Evaluation of both lower extremities was performed at rest, including calculation of ankle-brachial indices, multiple segmental pressure evaluation, segmental Doppler and segmental pulse volume recording. COMPARISON:  None. FINDINGS: Right ABI:  1.52 Left ABI:  1.05 Right Lower Extremity: Segmental Doppler on the right lower extremity demonstrates biphasic posterior tibial artery and monophasic dorsalis pedis. Left Lower Extremity: Segmental Doppler of the left lower extremity demonstrates triphasic dorsalis pedis and biphasic posterior tibial artery. IMPRESSION: Right: Resting ABI of the right lower extremity likely falsely elevated, potentially from noncompressible vessels. Segmental Doppler at the right ankle demonstrates evidence of developing occlusive disease, though waveforms maintained. Left: Resting ABI within normal limits. Segmental Doppler at the left ankle demonstrates evidence of development occlusive disease, with waveforms relatively maintained. Signed, Dulcy Fanny. Dellia Nims, RPVI [facility] Radiology Electronically Signed   By: Corrie Mckusick D.O.   On: 03/09/2020 14:36   DG Foot Complete Right  Result Date: 03/08/2020 CLINICAL DATA:  Foot infection EXAM: RIGHT FOOT COMPLETE - 3+ VIEW COMPARISON:  11/29/2018 FINDINGS: No fracture or malalignment. Vascular calcifications. Small plantar calcaneal spur. Joint spaces are maintained. IMPRESSION: No acute osseous abnormality. Electronically Signed   By: Donavan Foil M.D.   On: 03/08/2020 19:49   Scheduled Meds:  atorvastatin  40 mg Oral QHS   calcium-vitamin D  1 tablet Oral Q breakfast   cholecalciferol  1,000 Units Oral Daily   DULoxetine  30 mg Oral BID   enoxaparin (LOVENOX) injection  40 mg  Subcutaneous Q24H   insulin aspart  0-5 Units Subcutaneous QHS   insulin aspart  0-9 Units Subcutaneous TID WC   insulin detemir  10 Units Subcutaneous QHS   linagliptin  5 mg Oral Daily   lisinopril  2.5 mg Oral Daily   terbinafine   Topical BID   vitamin B-12  500 mcg Oral Daily   zonisamide  100 mg Oral Daily   Continuous Infusions:  sodium chloride     ceFEPime (MAXIPIME) IV 2 g (03/10/20 0618)   magnesium sulfate bolus IVPB     vancomycin 1,000 mg (03/10/20 0353)    LOS: 1 day   Kerney Elbe, DO [facility] PAGER is on AMION  If 7PM-7AM, please contact night-coverage [email or web address]

## 2020-03-10 NOTE — Progress Notes (Signed)
   03/10/20 0006 03/10/20 0106  Vitals  Temp (!) 101.5 F (38.6 C) 99 F (37.2 C)  Temp Source Oral Oral  BP 120/60 (!) 122/59  MAP (mmHg) 77  --   BP Location Right Arm Right Arm  BP Method Automatic Automatic  Patient Position (if appropriate) Lying Lying  Pulse Rate (!) 106 (!) 101  Pulse Rate Source Monitor  --   Resp 17 18  Level of Consciousness  Level of Consciousness  --  Alert  MEWS COLOR  MEWS Score Color Yellow Green

## 2020-03-10 NOTE — Transfer of Care (Signed)
Immediate Anesthesia Transfer of Care Note  Patient: Kristy Mcmillan  Procedure(s) Performed: AMPUTATION FIFTH RAY WITH IRRIGATION AND DEBRIDEMENT RIGHT FOOT (Right Toe)  Patient Location: PACU  Anesthesia Type:General  Level of Consciousness: awake  Airway & Oxygen Therapy: Patient Spontanous Breathing and Patient connected to face mask oxygen  Post-op Assessment: Report given to RN and Post -op Vital signs reviewed and stable  Post vital signs: Reviewed and stable  Last Vitals:  Vitals Value Taken Time  BP    Temp    Pulse    Resp    SpO2      Last Pain:  Vitals:   03/10/20 1642  TempSrc:   PainSc: 7          Complications: No complications documented.
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
## 2020-03-10 NOTE — Op Note (Signed)
Date of operation: 03/10/2020.  Surgeon: Durward Fortes D.P.M.  Preoperative diagnosis: 1.  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 2.  Multiple abscess right foot.  Postoperative diagnosis: Same.  Procedures: 1.  Right fifth toe amputation with partial ray resection. 2.  I&D abscess multiple sites right foot.  Anesthesia: LMA.  Hemostasis: Pneumatic tourniquet right ankle 250 mmHg.  Estimated blood loss: 5 cc.  Pathology: Right fifth ray.  Cultures: Deep abscess right foot.  Implants: Stimulan rapid cure antibiotic beads impregnated with vancomycin.  Complications: None other than severe abscess.  Operative indications: This is a 44 year old female with recent development of infection and abscess with gangrenous changes to the right fifth toe.  This started after a pedicure on August 30.  Operative procedure: Patient was taken to the operating room and placed on the table in the supine position.  Following satisfactory LMA anesthesia a pneumatic tourniquet was applied at the level of the right ankle and the foot was prepped and draped in the usual sterile fashion.  The foot was elevation only exsanguinated and the tourniquet inflated to 250 mmHg.     Attention was then directed to the lateral aspect of the right foot where a racquet type incision was made around the base of the fifth toe extending along the fifth metatarsal.  The incision was deepened sharply down to the level of the bone with dissection carried out along the bone and joint.  There was noted to be extensive purulence expressed from the wound at this time.  A culture was taken from the deep abscess for sensitivities.  Using a sagittal saw the fifth metatarsal was incised at about the level of the midshaft and the fifth ray was removed in toto and passed off the field.  An approximate 6 cm linear incision was then made over the third metatarsal area over the abscessed area and through some of the necrosing  skin.  Again significant purulent discharge noted with necrosis of the deeper tissues down to the layer of tendons.  Gross infected and necrotic tissue was then excisionally debrided using a rongeur down to the level of tendon and deep space tissues with the abscess extending basically across the entire forefoot sparing only the first metatarsal area.  Abscess was also noted plantarly upon expression of the flexor tendon sheath and an approximate 6 cm linear incision was made through the plantar forefoot extending into the medial arch along the tendon course and carried down to the level of the tendon.  Gross devitalized tissue was then again excisionally debrided using a rongeur down to the level of the tendon and deep space.  All 3 wound areas were then thoroughly debrided using a versa jet debrider on a setting of 3 followed by thorough irrigation with pulsed irrigation using 3 L of saline.  Stimulan rapid cure antibiotic beads then placed into each of the wounds which were then closed using 3-0 nylon simple interrupted suture.  20 cc of 0.5% Marcaine plain was then injected postoperatively for analgesia and the wound was then covered using Xeroform 4 x 4's ABDs and Kerlix.  Tourniquet was released.  A second Kerlix and Ace wrap were then applied.  Patient was awakened and transported to the PACU with vital signs stable and in good condition.

## 2020-03-10 NOTE — Interval H&P Note (Signed)
History and Physical Interval Note:  03/10/2020 6:50 PM  Kristy Mcmillan  has presented today for surgery, with the diagnosis of Gangrene right foot.  The various methods of treatment have been discussed with the patient and family. After consideration of risks, benefits and other options for treatment, the patient has consented to  Procedure(s): [place] (Right) as a surgical intervention.  The patient's history has been reviewed, patient examined, no change in status, stable for surgery.  I have reviewed the patient's chart and labs.  Questions were answered to the patient's satisfaction.     Durward Fortes

## 2020-03-10 NOTE — Anesthesia Preprocedure Evaluation (Signed)
Anesthesia Evaluation  Patient identified by MRN, date of birth, ID band Patient awake    Reviewed: Allergy & Precautions, H&P , NPO status , Patient's Chart, lab work & pertinent test results, reviewed documented beta blocker date and time   Airway Mallampati: II  TM Distance: >3 FB Neck ROM: full    Dental  (+) Teeth Intact, Poor Dentition, Chipped, Missing, Loose   Pulmonary neg pulmonary ROS, Current Smoker and Patient abstained from smoking.,    Pulmonary exam normal        Cardiovascular Exercise Tolerance: Poor (-) angina+ Peripheral Vascular Disease  (-) CAD, (-) CHF, (-) Orthopnea and (-) DVT Normal cardiovascular exam Rate:Normal     Neuro/Psych negative neurological ROS  negative psych ROS   GI/Hepatic negative GI ROS, Neg liver ROS,   Endo/Other  negative endocrine ROSdiabetes  Renal/GU negative Renal ROS  negative genitourinary   Musculoskeletal   Abdominal   Peds  Hematology negative hematology ROS (+)   Anesthesia Other Findings Horrible dentition and risks of tooth loss likely and discussed with patient. JA  Reproductive/Obstetrics negative OB ROS                             Anesthesia Physical Anesthesia Plan  ASA: II  Anesthesia Plan: General LMA   Post-op Pain Management:    Induction:   PONV Risk Score and Plan:   Airway Management Planned:   Additional Equipment:   Intra-op Plan:   Post-operative Plan:   Informed Consent: I have reviewed the patients History and Physical, chart, labs and discussed the procedure including the risks, benefits and alternatives for the proposed anesthesia with the patient or authorized representative who has indicated his/her understanding and acceptance.       Plan Discussed with: CRNA  Anesthesia Plan Comments:         Anesthesia Quick Evaluation

## 2020-03-11 ENCOUNTER — Encounter: Payer: Self-pay | Admitting: Podiatry

## 2020-03-11 DIAGNOSIS — L02611 Cutaneous abscess of right foot: Secondary | ICD-10-CM

## 2020-03-11 DIAGNOSIS — M869 Osteomyelitis, unspecified: Secondary | ICD-10-CM

## 2020-03-11 LAB — CBC WITH DIFFERENTIAL/PLATELET
Abs Immature Granulocytes: 0.89 10*3/uL — ABNORMAL HIGH (ref 0.00–0.07)
Basophils Absolute: 0.1 10*3/uL (ref 0.0–0.1)
Basophils Relative: 0 %
Eosinophils Absolute: 0.2 10*3/uL (ref 0.0–0.5)
Eosinophils Relative: 1 %
HCT: 24.9 % — ABNORMAL LOW (ref 36.0–46.0)
Hemoglobin: 8.2 g/dL — ABNORMAL LOW (ref 12.0–15.0)
Immature Granulocytes: 3 %
Lymphocytes Relative: 8 %
Lymphs Abs: 2.1 10*3/uL (ref 0.7–4.0)
MCH: 27.9 pg (ref 26.0–34.0)
MCHC: 32.9 g/dL (ref 30.0–36.0)
MCV: 84.7 fL (ref 80.0–100.0)
Monocytes Absolute: 1.3 10*3/uL — ABNORMAL HIGH (ref 0.1–1.0)
Monocytes Relative: 5 %
Neutro Abs: 23.8 10*3/uL — ABNORMAL HIGH (ref 1.7–7.7)
Neutrophils Relative %: 83 %
Platelets: 530 10*3/uL — ABNORMAL HIGH (ref 150–400)
RBC: 2.94 MIL/uL — ABNORMAL LOW (ref 3.87–5.11)
RDW: 13.2 % (ref 11.5–15.5)
Smear Review: NORMAL
WBC: 28.4 10*3/uL — ABNORMAL HIGH (ref 4.0–10.5)
nRBC: 0 % (ref 0.0–0.2)

## 2020-03-11 LAB — GLUCOSE, CAPILLARY
Glucose-Capillary: 111 mg/dL — ABNORMAL HIGH (ref 70–99)
Glucose-Capillary: 141 mg/dL — ABNORMAL HIGH (ref 70–99)
Glucose-Capillary: 143 mg/dL — ABNORMAL HIGH (ref 70–99)
Glucose-Capillary: 147 mg/dL — ABNORMAL HIGH (ref 70–99)
Glucose-Capillary: 152 mg/dL — ABNORMAL HIGH (ref 70–99)
Glucose-Capillary: 167 mg/dL — ABNORMAL HIGH (ref 70–99)

## 2020-03-11 LAB — COMPREHENSIVE METABOLIC PANEL
ALT: 8 U/L (ref 0–44)
AST: 12 U/L — ABNORMAL LOW (ref 15–41)
Albumin: 1.8 g/dL — ABNORMAL LOW (ref 3.5–5.0)
Alkaline Phosphatase: 131 U/L — ABNORMAL HIGH (ref 38–126)
Anion gap: 11 (ref 5–15)
BUN: 17 mg/dL (ref 6–20)
CO2: 20 mmol/L — ABNORMAL LOW (ref 22–32)
Calcium: 8 mg/dL — ABNORMAL LOW (ref 8.9–10.3)
Chloride: 102 mmol/L (ref 98–111)
Creatinine, Ser: 0.96 mg/dL (ref 0.44–1.00)
GFR calc Af Amer: >60 mL/min (ref >60)
GFR calc non Af Amer: >60 mL/min (ref >60)
Glucose, Bld: 145 mg/dL — ABNORMAL HIGH (ref 70–99)
Potassium: 4.4 mmol/L (ref 3.5–5.1)
Sodium: 133 mmol/L — ABNORMAL LOW (ref 135–145)
Total Bilirubin: 0.7 mg/dL (ref 0.3–1.2)
Total Protein: 7 g/dL (ref 6.5–8.1)

## 2020-03-11 LAB — PHOSPHORUS: Phosphorus: 3.7 mg/dL (ref 2.5–4.6)

## 2020-03-11 LAB — MAGNESIUM: Magnesium: 1.9 mg/dL (ref 1.7–2.4)

## 2020-03-11 MED ORDER — ENOXAPARIN SODIUM 40 MG/0.4ML ~~LOC~~ SOLN
40.0000 mg | SUBCUTANEOUS | Status: DC
  Administered 2020-03-11 – 2020-03-16 (×3): 40 mg via SUBCUTANEOUS
  Filled 2020-03-11 (×4): qty 0.4

## 2020-03-11 NOTE — Progress Notes (Signed)
Pts O2 need increased to 5L, now sat is 93. When assessed initially, sat was at 86% on 3L. Resp called and assessed pt. Will continue to monitor at this time.

## 2020-03-11 NOTE — Progress Notes (Addendum)
1 Day Post-Op   Subjective/Chief Complaint: Patient seen.  Significant pain in the right foot overnight.   Objective: Vital signs in last 24 hours: Temp:  [98 F (36.7 C)-100.6 F (38.1 C)] 99 F (37.2 C) (09/12 0725) Pulse Rate:  [94-107] 105 (09/12 0725) Resp:  [12-20] 15 (09/12 0725) BP: (102-127)/(62-85) 106/62 (09/12 0725) SpO2:  [84 %-100 %] 90 % (09/12 0725) Last BM Date: 03/10/20  Intake/Output from previous day: 09/11 0701 - 09/12 0700 In: 1481.8 [I.V.:709.4; IV Piggyback:772.5] Out: 5 [Blood:5] Intake/Output this shift: No intake/output data recorded.  The bandage on the right foot is dry and intact.  Upon removal there is some moderate drainage on the bandaging with no overt areas of pus.  Minimal bleeding noted.  Significant skin necrosis on the dorsum of the right foot.  Fifth ray resection is well coapted as well as the plantar incision with reduced erythema and edema.        Lab Results:  Recent Labs    03/10/20 0458 03/11/20 0911  WBC 25.6* 28.4*  HGB 8.3* 8.2*  HCT 24.3* 24.9*  PLT 454* 530*   BMET Recent Labs    03/10/20 0458 03/11/20 0911  NA 132* 133*  K 3.6 4.4  CL 100 102  CO2 20* 20*  GLUCOSE 188* 145*  BUN 14 17  CREATININE 0.75 0.96  CALCIUM 8.0* 8.0*   PT/INR No results for input(s): LABPROT, INR in the last 72 hours. ABG No results for input(s): PHART, HCO3 in the last 72 hours.  Invalid input(s): PCO2, PO2  Studies/Results: MR FOOT RIGHT WO CONTRAST  Result Date: 03/10/2020 CLINICAL DATA:  Right foot pain and swelling.  Diabetic. EXAM: MRI OF THE RIGHT FOREFOOT WITHOUT CONTRAST TECHNIQUE: Multiplanar, multisequence MR imaging of the right foot was performed. No intravenous contrast was administered. COMPARISON:  Radiographs 03/08/2020 FINDINGS: Exam limited by a patient motion and lack of IV contrast. Severe diffuse subcutaneous soft tissue swelling/edema/fluid mainly involving the dorsum of the foot. I do not see any  increased T1 signal intensity to suggest hemorrhage/hematoma. More focal appearance of the fluid signal intensity on the dorsum of the foot is suspicious for an abscess but difficult to be certain without contrast. It measures approximately 5 x 4 x 2 cm. There is also a more focal fluid collection along the plantar aspect of the fifth toe and also along the plantar aspect of the foot overlying the fourth metatarsal region. These could be abscesses also. There is T1 and T2 signal abnormality noted in the fifth metatarsal head and fifth proximal phalanx worrisome for osteomyelitis. Could not exclude septic arthritis at the fifth MTP joint. The surrounding fluid collections are suspicious for abscesses. Diffuse myofasciitis without obvious pyomyositis. The midfoot bony structures are intact. No findings suspicious for osteomyelitis. IMPRESSION: 1. Exam significantly limited by patient motion and lack of contrast. 2. 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 3. Suspect large dorsal and smaller plantar forefoot abscesses as detailed above. 4. Diffuse myofasciitis without obvious pyomyositis. Electronically Signed   By: Marijo Sanes M.D.   On: 03/10/2020 11:58   US ARTERIAL ABI (SCREENING LOWER EXTREMITY)  Result Date: 03/09/2020 CLINICAL DATA:  44 year old female with a history of toe necrosis EXAM: NONINVASIVE PHYSIOLOGIC VASCULAR STUDY OF BILATERAL LOWER EXTREMITIES TECHNIQUE: Evaluation of both lower extremities was performed at rest, including calculation of ankle-brachial indices, multiple segmental pressure evaluation, segmental Doppler and segmental  pulse volume recording. COMPARISON:  None. FINDINGS: Right ABI:  1.52 Left ABI:  1.05 Right Lower Extremity: Segmental Doppler on the right lower extremity demonstrates biphasic posterior tibial artery and monophasic dorsalis pedis. Left Lower Extremity:  Segmental Doppler of the left lower extremity demonstrates triphasic dorsalis pedis and biphasic posterior tibial artery. IMPRESSION: Right: Resting ABI of the right lower extremity likely falsely elevated, potentially from noncompressible vessels. Segmental Doppler at the right ankle demonstrates evidence of developing occlusive disease, though waveforms maintained. Left: Resting ABI within normal limits. Segmental Doppler at the left ankle demonstrates evidence of development occlusive disease, with waveforms relatively maintained. Signed, Dulcy Fanny. Dellia Nims, RPVI [facility] Electronically Signed   By: Corrie Mckusick D.O.   On: 03/09/2020 14:36    Anti-infectives: Anti-infectives (From admission, onward)   Start     Dose/Rate Route Frequency Ordered Stop   03/10/20 2018  vancomycin (VANCOCIN) powder  Status:  Discontinued          As needed 03/10/20 2018 03/10/20 2042   03/09/20 1400  vancomycin (VANCOCIN) IVPB 1000 mg/200 mL premix        1,000 mg 200 mL/hr over 60 Minutes Intravenous Every 12 hours 03/09/20 0301     03/09/20 0900  ceFEPIme (MAXIPIME) 2 g in sodium chloride 0.9 % 100 mL IVPB        2 g 200 mL/hr over 30 Minutes Intravenous Every 8 hours 03/09/20 0144     03/09/20 0200  vancomycin (VANCOREADY) IVPB 500 mg/100 mL        500 mg 100 mL/hr over 60 Minutes Intravenous  Once 03/09/20 0149 03/09/20 0437   03/09/20 0145  vancomycin (VANCOCIN) IVPB 1000 mg/200 mL premix  Status:  Discontinued        1,000 mg 200 mL/hr over 60 Minutes Intravenous  Once 03/09/20 0144 03/09/20 0149   03/09/20 0030  ceFEPIme (MAXIPIME) 2 g in sodium chloride 0.9 % 100 mL IVPB        2 g 200 mL/hr over 30 Minutes Intravenous  Once 03/09/20 0019 03/09/20 0133   03/09/20 0030  metroNIDAZOLE (FLAGYL) IVPB 500 mg        500 mg 100 mL/hr over 60 Minutes Intravenous  Once 03/09/20 0019 03/09/20 0214   03/09/20 0030  vancomycin (VANCOCIN) IVPB 1000  mg/200 mL premix        1,000 mg 200 mL/hr over 60 Minutes Intravenous  Once 03/09/20 0019 03/09/20 0332      Assessment/Plan: s/p Procedure(s): 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   Plan: Betadine gauze applied to all of the incision areas followed by bulky gauze and Kerlix bandage.  We will continue to monitor daily.  Due to the amount of infection patient still at significant risk for amputation of the right lower extremity.  This was discussed with the patient and her husband.  At this point would most likely recommend vascular evaluation with angiogram for circulation status as well as evaluation if higher amputation is needed.  LOS: 2 days    Durward Fortes 03/11/2020

## 2020-03-11 NOTE — Progress Notes (Signed)
This Probation officer attempt to educate patient husband whom appeared to be intoxicated that he has to wear his mask in the room. He states "  I ain't wearing no mask I don't give an F about COVID". Dr. Cleda Mccreedy in room , her husband then sat down on the orther side of room at the window. Dr. Cleda Mccreedy changed patients dressing , this writer notified Charge RN of patients husbands remark and noncompliance with hospital policy

## 2020-03-11 NOTE — Progress Notes (Signed)
PROGRESS NOTE    Kristy Mcmillan  WJX:[id number] DOB: 03-Apr-1976 DOA: 03/09/2020 [facility]CP: [facility] [place]  Brief Narrative:  The patient is a 44 year old overweight Caucasian female with a past medical history significant for but not limited to is mellitus type II, history of seizure disorder who presented to the emergency room with acute onset of worsening right foot swelling associated with erythema, pain and tenderness.  She had a pedicure on 02/27/2020 before symptoms and began having a fever over the last couple days associated with chills.  She denies any nausea or vomiting.  She is not vaccinated against COVID-19 disease and upon presentation she is found to be febrile with a tachycardia and a respiratory rate of 29.  Labs revealed borderline potassium of 2.5 and she did have a hyponatremia and hypochloremia and elevated blood sugar with a hyperglycemia of 428.  In the ED sepsis protocol was started and she is given acetaminophen, IV cefepime, Flagyl and IV vancomycin as well as morphine sulfate IV Zofran.  Her potassium was repleted as well.  She was admitted for sepsis secondary to right foot extremity cellulitis with right fifth toe necrosis in the setting of an uncontrolled diabetic foot.    **Interim History She had bedside debridement done by podiatry when they were consulted. MRI was finally done. Vascular surgery evaluated and considering an angiogram early next week.  Continues to still be septic and had a temperature, tachycardia and tachypnea overnight which is now improved.  She also started having some loose stools likely antibiotic mediated.  03/11/20 MRI done yesterday showed "Exam significantly limited by patient motion and lack of contrast. 2. 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 3. Suspect large dorsal and smaller plantar forefoot abscesses as detailed above. 4.  Diffuse myofasciitis without obvious pyomyositis."   Assessment & Plan:   Active Problems:   Lower extremity cellulitis  Sepsis 2/2Right lower extremity/mainly foot severe cellulitis septic arthritis of the fifth MTP joint with osteomyelitis including the fifth metatarsal head and fifth proximal phalanx along with multiple abscesses in the setting of uncontrolled type 2 diabetes mellitus and diabetic foot.  Status post incision and debridement postoperative day 1 -The patient has subsequent sepsis without severe sepsis or septic shock as manifested by significant leuko-cytosis, fever, tachycardia and tachypnea; overnight she is febrile again with a temperature of 101.5, tachycardic with a pulse rate of 110, and respiratory rate 21 -DG of the foot showed "No fracture or malalignment. Vascular calcifications. Small plantar calcaneal spur. Joint spaces are maintained." -The patient will be admitted to a medically monitored bed. -We will continue antibiotic therapy with IV cefepime and vancomycin.  We have stopped IV Flagyl and changed from IV Zosyn to IV cefepime -Wound care consult to be obtained. -Podiatry consult has been obtained and they request vascular surgery evaluation as well; podiatry evaluated and she does have a full-thickness ulceration with abscess and early gangrenous changes to the right fifth toe as well as superficial abscess with underlying superficial ulceration of the right arch and so podiatry debrided the devitalized tissue from the ulceration of the fifth toe and debrided the devitalized tissue from the plantar ulceration sharply using the nippers and have ordered an MRI of the foot.  Dr. Cleda Mccreedy discussed with the patient that she is likely going to need some type of I&D for infection in her right foot is significantly at risk for losing her  toe with a right fifth toe amputation -Vascular surgery has been consulted as well and I recommend following up on MRI and podiatry  recommendations and await MRI results.  They are considering an angiogram early next week -We'll check ABIs and showed the following:  "Right:  Resting ABI of the right lower extremity likely falsely elevated, potentially from noncompressible vessels.  Segmental Doppler at the right ankle demonstrates evidence of developing occlusive disease, though waveforms maintained.  Left:  Resting ABI within normal limits.  Segmental Doppler at the left ankle demonstrates evidence of development occlusive disease, with waveforms relatively maintained."   -Podiatry consulted and recommending further imaging with an MRI; -MRI done yesterday showed "Exam significantly limited by patient motion and lack of contrast. 2. 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 3. Suspect large dorsal and smaller plantar forefoot abscesses as detailed above. 4. Diffuse myofasciitis without obvious pyomyositis."  -Blood cultures x2 have been obtained and showed no growth to date 2 day -Given the likelihood of underlying tinea pedis that could be preceding bacterial superinfection, she was started n Lamisil cream twice daily. -She is both on lactated Ringer's as well as normal saline we discontinue lactated Ringer's 150 MLS per hour and continue normal saline at 100 mL's per hour for now and will reduce the rate to 75 MLS per hour for another 1 day -Continue to follow blood cultures and temperature curve and repeat CBC in a.m. -Wound culture taken on 03/09/2020 and showed rare WBCs present and culture grew out few Staph aureus with susceptibilities to follow -Wound culture taken intraoperatively on 03/10/2020 showed few WBCs present predominantly PMNs and few gram-positive cocci in pairs and clusters with culture pending -WBC on admission was 26.8 and was improved to 24.0 slightly yesterday and today is 25.6 and today is  28.4 -Podiatry discussed with the patient about needing amputation of the right fifth toe with ray resection as well as I&D of abscessed areas.  She was taken to the OR in the evening and had a right fifth toe amputation with partial ray resection and I&D of multiple abscesses at multiple sites in the right foot. -He was given Betadine gauze and this was applied to all incision areas followed by bulky gauze and a Kerlix bandage.  Podiatry is going to monitor continue daily.  But due to the amount of infection the patient is still at risk for amputation of the right lower extremity and podiatry recommends vascular surgery evaluation with angiogram for circulation status as well as higher amputation as needed -Continue supportive care and continue with antiemetics 4 mg p.o. every 6 as needed for nausea -We will have vascular follow-up in the a.m.  Uncontrolled Hyperglycemia in the setting of type 2 diabetes mellitus -Patient hemoglobin A1c was 9.7 -The patient will be placed on resistant subcutaneous NovoLog with frequent fingerstick blood glucose measures in addition to hydration with IV normal saline however she was changed to sensitive NovoLog sliding scale before meals and at bedtime given her hypoglycemic episode -We will continue basal coverage Levemir 10 units subcu and p.o. Tradjenta. -CBGs ranging from 138-167  Essential Hypertension. -Continue with lisinopril 2.5 mg p.o. daily  Hyperlipidemia -Continue with Atorvastatin 40 g p.o. nightly  GERD. -PPI therapy will be resumed.  Hyponatremia/Hypochloremia -Mild patient's sodium went from 130 is now 129 yesterday; Now 133 -Patient's chloride level has gone from 93 -> 96 -> 100 -> 102 -  IV fluid hydration is now stopped -Continue monitor and trend and repeat CMP in a.m.  Loose Stools -In the setting of Antibiotics -WBC is elevated but likely from Severe Cellulitis -Continue to Monitor and if worsens consider checking C Difficile  and GI Pathogen Panel -Hold all Laxatives and if necessary Hold PPI  Hypokalemia -Patient's potassium this morning was 4.4 -Check Magnesium Level and was 1.9 -Continue to Monitor and Replete as Necessary -Repeat CMP in the AM  Thrombocytosis -Patient's platelet count on admission was 570 and is improved to 532 and is now 454 went back up again is now 530 -Likely is Reactive in the setting of Infection -Continue monitor and trend and Repeat CBC in a.m.  Mild AKI Metabolic acidosis, mild -Patient's BUNs/creatinine on admission was 18/1.17 and improving and is now 14/0.75 yesterday and today it is 70/0.96 -Patient's CO2 is now 20, anion gap is 11, chloride level was 102 -Baseline appears to be from 0.6-0.8 -Continue with IV fluid hydration as above and reduce rate from 100 mL/hr to 75 mL/hr -Avoid further nephrotoxic medications, contrast dyes, and hypotension and renally adjust medications  -Repeat CBC in a.m.  Normocytic Anemia -Patient's hemoglobin/hematocrit admission was 10.5/30.8 and is now dropped to 8.2/24.9 likely in the setting of a dilutional drop from IV fluids -Continue with IV fluids as above  -Check anemia panel in the morning -Continue to monitor for signs and symptoms of bleeding; currently no overt bleeding noted -Repeat CBC in the AM   DiabeticPeripheral Neuropathy. -Continue with Duloxetine 30 mg q. twice daily  History of Seizures  -Continue with Zonisamide 100 g p.o. daily  -Has had childhood febrile seizure and questionable eclampsia  Hypomagnesemia -Patient's magnesium level is now 1.9 -Continue to monitor and replete as necessary -Repeat Magnesium Level in the AM  DVT prophylaxis: Enoxaparin 40 mg sq q24h Code Status: FULL CODE  Family Communication: Discussed with husband at bedside Disposition Plan: Pending further evaluation by Orthopedic Surgery and Vascular Surgery  Status is: Inpatient  Remains inpatient appropriate because:Ongoing  diagnostic testing needed not appropriate for outpatient work up, IV treatments appropriate due to intensity of illness or inability to take PO and Inpatient level of care appropriate due to severity of illness   Dispo: The patient is from: Home              Anticipated d/c is to: TBD              Anticipated d/c date is: 2 days minimum              Patient currently is not medically stable to d/c.  Consultants:   Podiatry  Vascular Surgery   Procedures:  ABI's IMPRESSION: Right:  Resting ABI of the right lower extremity likely falsely elevated, potentially from noncompressible vessels.  Segmental Doppler at the right ankle demonstrates evidence of developing occlusive disease, though waveforms maintained.  Left:  Resting ABI within normal limits.  Segmental Doppler at the left ankle demonstrates evidence of development occlusive disease, with waveforms relatively maintained.  Antimicrobials:  Anti-infectives (From admission, onward)   Start     Dose/Rate Route Frequency Ordered Stop   03/10/20 2018  vancomycin (VANCOCIN) powder  Status:  Discontinued          As needed 03/10/20 2018 03/10/20 2042   03/09/20 1400  vancomycin (VANCOCIN) IVPB 1000 mg/200 mL premix        1,000 mg 200 mL/hr over 60 Minutes Intravenous Every 12 hours 03/09/20 0301  03/09/20 0900  ceFEPIme (MAXIPIME) 2 g in sodium chloride 0.9 % 100 mL IVPB        2 g 200 mL/hr over 30 Minutes Intravenous Every 8 hours 03/09/20 0144     03/09/20 0200  vancomycin (VANCOREADY) IVPB 500 mg/100 mL        500 mg 100 mL/hr over 60 Minutes Intravenous  Once 03/09/20 0149 03/09/20 0437   03/09/20 0145  vancomycin (VANCOCIN) IVPB 1000 mg/200 mL premix  Status:  Discontinued        1,000 mg 200 mL/hr over 60 Minutes Intravenous  Once 03/09/20 0144 03/09/20 0149   03/09/20 0030  ceFEPIme (MAXIPIME) 2 g in sodium chloride 0.9 % 100 mL IVPB        2 g 200 mL/hr over 30 Minutes Intravenous  Once 03/09/20 0019  03/09/20 0133   03/09/20 0030  metroNIDAZOLE (FLAGYL) IVPB 500 mg        500 mg 100 mL/hr over 60 Minutes Intravenous  Once 03/09/20 0019 03/09/20 0214   03/09/20 0030  vancomycin (VANCOCIN) IVPB 1000 mg/200 mL premix        1,000 mg 200 mL/hr over 60 Minutes Intravenous  Once 03/09/20 0019 03/09/20 0332        Subjective: Seen and examined at bedside and she is having significant right foot pain.  No nausea or vomiting.  Denies any lightheadedness or dizziness but did not feel well.  Husband at bedside and had a lot of questions that were answered.  No other concerns or complaints at this time.  Objective: Vitals:   03/11/20 0245 03/11/20 0653 03/11/20 0725 03/11/20 1513  BP: 114/68 102/65 106/62 99/61  Pulse: (!) 105 (!) 104 (!) 105 99  Resp: 19 17 15 16   Temp: 99.8 F (37.7 C) 98 F (36.7 C) 99 F (37.2 C) 98.8 F (37.1 C)  TempSrc: Oral Oral Oral Oral  SpO2: 96% (!) 84% 90% 90%  Weight:      Height:        Intake/Output Summary (Last 24 hours) at 03/11/2020 1750 Last data filed at 03/11/2020 1500 Gross per 24 hour  Intake 1287.96 ml  Output 5 ml  Net 1282.96 ml   Filed Weights   03/08/20 1911  Weight: 75.8 kg   Examination: Physical Exam:  Constitutional: WN/WD overweight Caucasian female currently in mild distress appears a little anxious and does appear somewhat uncomfortable with complaints of pain in her foot Eyes: Lids and conjunctivae normal, sclerae anicteric  ENMT: External Ears, Nose appear normal. Grossly normal hearing. Neck: Appears normal, supple, no cervical masses, normal ROM, no appreciable thyromegaly; no JVD Respiratory: Diminished to auscultation bilaterally with no appreciable wheezing, rales, rhonchi., no wheezing, rales, rhonchi or crackles.  Wearing supplemental oxygen via nasal cannula after surgery and will attempt to wean off Cardiovascular: RRR, no murmurs / rubs / gallops. S1 and S2 auscultated.  Right leg was wrapped Abdomen: Soft,  non-tender, distended secondary to body habitus. Bowel sounds positive.  GU: Deferred. Musculoskeletal: No clubbing / cyanosis of digits/nails. No joint deformity upper and lower extremities.   Skin: No rashes, lesions, ulcers on limited skin evaluation. No induration; Warm and dry.  Neurologic: CN 2-12 grossly intact with no focal deficits. Romberg sign and cerebellar reflexes not assessed.  Psychiatric: Normal judgment and insight. Alert and oriented x 3.  Anxious mood and appropriate affect.   Data Reviewed: I have personally reviewed following labs and imaging studies  CBC: Recent Labs  Lab 03/08/20  1925 03/09/20 0514 03/10/20 0458 03/11/20 0911  WBC 26.8* 24.0* 25.6* 28.4*  NEUTROABS 22.9*  --  20.5* 23.8*  HGB 10.5* 8.7* 8.3* 8.2*  HCT 30.8* 25.4* 24.3* 24.9*  MCV 81.9 82.2 81.8 84.7  PLT 578* 432* 454* 188*   Basic Metabolic Panel: Recent Labs  Lab 03/08/20 1925 03/09/20 0514 03/10/20 0458 03/11/20 0911  NA 130* 129* 132* 133*  K 3.5 3.4* 3.6 4.4  CL 93* 96* 100 102  CO2 25 24 20* 20*  GLUCOSE 428* 422* 188* 145*  BUN 18 17 14 17   CREATININE 1.17* 1.09* 0.75 0.96  CALCIUM 8.5* 7.8* 8.0* 8.0*  MG  --   --  1.5* 1.9  PHOS  --   --  3.0 3.7   GFR: Estimated Creatinine Clearance: 74.5 mL/min (by C-G formula based on SCr of 0.96 mg/dL). Liver Function Tests: Recent Labs  Lab 03/08/20 1925 03/10/20 0458 03/11/20 0911  AST 11* 12* 12*  ALT 10 9 8   ALKPHOS 122 113 131*  BILITOT 0.7 0.9 0.7  PROT 8.4* 6.3* 7.0  ALBUMIN 2.4* 1.8* 1.8*   No results for input(s): LIPASE, AMYLASE in the last 168 hours. No results for input(s): AMMONIA in the last 168 hours. Coagulation Profile: No results for input(s): INR, PROTIME in the last 168 hours. Cardiac Enzymes: No results for input(s): CKTOTAL, CKMB, CKMBINDEX, TROPONINI in the last 168 hours. BNP (last 3 results) No results for input(s): PROBNP in the last 8760 hours. HbA1C: Recent Labs    03/09/20 0514  HGBA1C  9.7*   CBG: Recent Labs  Lab 03/11/20 0054 03/11/20 0449 03/11/20 0726 03/11/20 1117 03/11/20 1626  GLUCAP 141* 152* 147* 167* 143*   Lipid Profile: No results for input(s): CHOL, HDL, LDLCALC, TRIG, CHOLHDL, LDLDIRECT in the last 72 hours. Thyroid Function Tests: No results for input(s): TSH, T4TOTAL, FREET4, T3FREE, THYROIDAB in the last 72 hours. Anemia Panel: No results for input(s): VITAMINB12, FOLATE, FERRITIN, TIBC, IRON, RETICCTPCT in the last 72 hours. Sepsis Labs: Recent Labs  Lab 03/08/20 1925  LATICACIDVEN 1.3    Recent Results (from the past 240 hour(s))  Blood culture (routine x 2)     Status: None (Preliminary result)   Collection Time: 03/08/20  7:26 PM   Specimen: BLOOD  Result Value Ref Range Status   Specimen Description BLOOD RIGHT ANTECUBITAL  Final   Special Requests   Final    BOTTLES DRAWN AEROBIC AND ANAEROBIC Blood Culture results may not be optimal due to an excessive volume of blood received in culture bottles   Culture   Final    NO GROWTH 3 DAYS Performed at [facility], [address], [place], [place] [address]    Report Status PENDING  Incomplete  Blood culture (routine x 2)     Status: None (Preliminary result)   Collection Time: 03/09/20 12:55 AM   Specimen: BLOOD  Result Value Ref Range Status   Specimen Description BLOOD LEFT [facility]  Final   Special Requests   Final    BOTTLES DRAWN AEROBIC AND ANAEROBIC Blood Culture adequate volume   Culture   Final    NO GROWTH 2 DAYS Performed at [facility], [address], [place], [place] [address]    Report Status PENDING  Incomplete  SARS Coronavirus 2 by RT PCR (hospital order, performed in [place] hospital lab) Nasopharyngeal Nasopharyngeal Swab     Status: None   Collection Time: 03/09/20 12:55 AM   Specimen: Nasopharyngeal Swab  Result Value Ref Range Status   SARS Coronavirus 2 NEGATIVE NEGATIVE Final    Comment: (NOTE) SARS-CoV-2 target nucleic acids are  NOT DETECTED.  The SARS-CoV-2 RNA is generally detectable in upper and lower respiratory specimens during the acute phase of infection. The lowest concentration of SARS-CoV-2 viral copies this assay can detect is 250 copies / mL. A negative result does not preclude SARS-CoV-2 infection and should not be used as the sole basis for treatment or other patient management decisions.  A negative result may occur with improper specimen collection / handling, submission of specimen other than nasopharyngeal swab, presence of viral mutation(s) within the areas targeted by this assay, and inadequate number of viral copies (<250 copies / mL). A negative result must be combined with clinical observations, patient history, and epidemiological information.  Fact Sheet for Patients:   StrictlyIdeas.no  Fact Sheet for Healthcare Providers: BankingDealers.co.za  This test is not yet approved or  cleared by the [place] FDA and has been authorized for detection and/or diagnosis of SARS-CoV-2 by FDA under an Emergency Use Authorization (EUA).  This EUA will remain in effect (meaning this test can be used) for the duration of the COVID-19 declaration under Section 564(b)(1) of the Act, 21 U.S.C. section 360bbb-3(b)(1), unless the authorization is terminated or revoked sooner.  Performed at [facility], [address] [place], [place] [address]   Aerobic/Anaerobic Culture (surgical/deep wound)     Status: None (Preliminary result)   Collection Time: 03/09/20  5:28 PM   Specimen: Wound; Abscess  Result Value Ref Range Status   Specimen Description WOUND RIGHT FOOT  Final   Special Requests NONE  Final   Gram Stain   Final    RARE WBC PRESENT, PREDOMINANTLY PMN NO ORGANISMS SEEN Performed at [facility], [address], [place], [place] [address]    Culture   Final    FEW STAPHYLOCOCCUS AUREUS SUSCEPTIBILITIES TO FOLLOW NO  ANAEROBES ISOLATED; CULTURE IN PROGRESS FOR 5 DAYS    Report Status PENDING  Incomplete  Aerobic/Anaerobic Culture (surgical/deep wound)     Status: None (Preliminary result)   Collection Time: 03/10/20  8:03 PM   Specimen: PATH Other; Wound  Result Value Ref Range Status   Specimen Description   Final    ABSCESS Performed at [facility], [address], [place], [place] [address]    Special Requests DEEP  Final   Gram Stain   Final    FEW WBC PRESENT, PREDOMINANTLY PMN FEW GRAM POSITIVE COCCI IN PAIRS IN CLUSTERS Performed at [facility], [place] [address], [place],  [address]    Culture PENDING  Incomplete   Report Status PENDING  Incomplete     RN Pressure Injury Documentation:     Estimated body mass index is 28.67 kg/m as calculated from the following:   Height as of this encounter: 5\' 4"  (1.626 m).   Weight as of this encounter: 75.8 kg.  Malnutrition Type:      Malnutrition Characteristics:      Nutrition Interventions:    Radiology Studies: MR FOOT RIGHT WO CONTRAST  Result Date: 03/10/2020 CLINICAL DATA:  Right foot pain and swelling.  Diabetic. EXAM: MRI OF THE RIGHT FOREFOOT WITHOUT CONTRAST TECHNIQUE: Multiplanar, multisequence MR imaging of the right foot was performed. No intravenous contrast was administered. COMPARISON:  Radiographs 03/08/2020 FINDINGS: Exam limited by a patient motion and lack of IV contrast. Severe diffuse subcutaneous soft tissue swelling/edema/fluid mainly involving the  dorsum of the foot. I do not see any increased T1 signal intensity to suggest hemorrhage/hematoma. More focal appearance of the fluid signal intensity on the dorsum of the foot is suspicious for an abscess but difficult to be certain without contrast. It measures approximately 5 x 4 x 2 cm. There is also a more focal fluid collection along the plantar aspect of the fifth toe and also along the plantar aspect of the foot overlying the fourth  metatarsal region. These could be abscesses also. There is T1 and T2 signal abnormality noted in the fifth metatarsal head and fifth proximal phalanx worrisome for osteomyelitis. Could not exclude septic arthritis at the fifth MTP joint. The surrounding fluid collections are suspicious for abscesses. Diffuse myofasciitis without obvious pyomyositis. The midfoot bony structures are intact. No findings suspicious for osteomyelitis. IMPRESSION: 1. Exam significantly limited by patient motion and lack of contrast. 2. 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 3. Suspect large dorsal and smaller plantar forefoot abscesses as detailed above. 4. Diffuse myofasciitis without obvious pyomyositis. Electronically Signed   By: Marijo Sanes M.D.   On: 03/10/2020 11:58   Scheduled Meds:  atorvastatin  40 mg Oral QHS   calcium-vitamin D  1 tablet Oral Q breakfast   cholecalciferol  1,000 Units Oral Daily   DULoxetine  30 mg Oral BID   enoxaparin (LOVENOX) injection  40 mg Subcutaneous Q24H   insulin aspart  0-5 Units Subcutaneous QHS   insulin aspart  0-9 Units Subcutaneous TID WC   insulin detemir  10 Units Subcutaneous QHS   linagliptin  5 mg Oral Daily   lisinopril  2.5 mg Oral Daily   terbinafine   Topical BID   vitamin B-12  500 mcg Oral Daily   zonisamide  100 mg Oral Daily   Continuous Infusions:  ceFEPime (MAXIPIME) IV 2 g (03/11/20 1226)   vancomycin 1,000 mg (03/11/20 1423)    LOS: 2 days   Kerney Elbe, DO [facility] PAGER is on AMION  If 7PM-7AM, please contact night-coverage [email or web address]

## 2020-03-11 NOTE — Progress Notes (Signed)
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 Palpable femoral pulses    Assessment/Plan:  POD #1  Upon removal of the bandage today, minimal bleeding was noted, therefore vascular evaluation was requested.  I discussed with the patient that we would proceed with angiography tomorrow to define her anatomy and to intervene if indicated.  I discussed the details of the procedure as well as the risks of benefits with the patient, and she wished to proceed.  She will be n.p.o. after midnight.  Kristy Mcmillan 03/11/2020 8:25 PM --  Vitals:   03/11/20 0725 03/11/20 1513  BP: 106/62 99/61  Pulse: (!) 105 99  Resp: 15 16  Temp: 99 F (37.2 C) 98.8 F (37.1 C)  SpO2: 90% 90%    Intake/Output Summary (Last 24 hours) at 03/11/2020 2025 Last data filed at 03/11/2020 1500 Gross per 24 hour  Intake 787.96 ml  Output 5 ml  Net 782.96 ml     Laboratory CBC    Component Value Date/Time   WBC 28.4 (H) 03/11/2020 0911   HGB 8.2 (L) 03/11/2020 0911   HGB 11.6 (L) 10/23/2012 1807   HCT 24.9 (L) 03/11/2020 0911   HCT 34.0 (L) 10/23/2012 1807   PLT 530 (H) 03/11/2020 0911   PLT 367 10/23/2012 1807    BMET    Component Value Date/Time   NA 133 (L) 03/11/2020 0911   NA 134 (L) 10/23/2012 1807   K 4.4 03/11/2020 0911   K 4.2 10/23/2012 1807   CL 102 03/11/2020 0911   CL 103 10/23/2012 1807   CO2 20 (L) 03/11/2020 0911   CO2 23 10/23/2012 1807   GLUCOSE 145 (H) 03/11/2020 0911   GLUCOSE 238 (H) 10/23/2012 1807   BUN 17 03/11/2020 0911   BUN 8 10/23/2012 1807   CREATININE 0.96 03/11/2020 0911   CREATININE 0.59 10/25/2012 1207   CALCIUM 8.0 (L) 03/11/2020 0911   CALCIUM 8.6 10/23/2012 1807   GFRNONAA >60 03/11/2020 0911   GFRNONAA >60 10/23/2012 1807   GFRAA >60 03/11/2020 0911   GFRAA >60 10/23/2012  1807    COAG No results found for: INR, PROTIME No results found for: PTT  Antibiotics Anti-infectives (From admission, onward)   Start     Dose/Rate Route Frequency Ordered Stop   03/10/20 2018  vancomycin (VANCOCIN) powder  Status:  Discontinued          As needed 03/10/20 2018 03/10/20 2042   03/09/20 1400  vancomycin (VANCOCIN) IVPB 1000 mg/200 mL premix        1,000 mg 200 mL/hr over 60 Minutes Intravenous Every 12 hours 03/09/20 0301     03/09/20 0900  ceFEPIme (MAXIPIME) 2 g in sodium chloride 0.9 % 100 mL IVPB        2 g 200 mL/hr over 30 Minutes Intravenous Every 8 hours 03/09/20 0144     03/09/20 0200  vancomycin (VANCOREADY) IVPB 500 mg/100 mL        500 mg 100 mL/hr over 60 Minutes Intravenous  Once 03/09/20 0149 03/09/20 0437   03/09/20 0145  vancomycin (VANCOCIN) IVPB 1000 mg/200 mL premix  Status:  Discontinued        1,000 mg 200 mL/hr over 60 Minutes Intravenous  Once 03/09/20 0144 03/09/20 0149   03/09/20 0030  ceFEPIme (MAXIPIME)  2 g in sodium chloride 0.9 % 100 mL IVPB        2 g 200 mL/hr over 30 Minutes Intravenous  Once 03/09/20 0019 03/09/20 0133   03/09/20 0030  metroNIDAZOLE (FLAGYL) IVPB 500 mg        500 mg 100 mL/hr over 60 Minutes Intravenous  Once 03/09/20 0019 03/09/20 0214   03/09/20 0030  vancomycin (VANCOCIN) IVPB 1000 mg/200 mL premix        1,000 mg 200 mL/hr over 60 Minutes Intravenous  Once 03/09/20 0019 03/09/20 0332       V. Leia Alf, M.D., [facility] [place] Office: [phone] Pager:  [phone]

## 2020-03-12 ENCOUNTER — Inpatient Hospital Stay: Payer: Medicare Other

## 2020-03-12 ENCOUNTER — Other Ambulatory Visit (INDEPENDENT_AMBULATORY_CARE_PROVIDER_SITE_OTHER): Payer: Self-pay | Admitting: Vascular Surgery

## 2020-03-12 DIAGNOSIS — A419 Sepsis, unspecified organism: Principal | ICD-10-CM

## 2020-03-12 DIAGNOSIS — L03115 Cellulitis of right lower limb: Secondary | ICD-10-CM

## 2020-03-12 DIAGNOSIS — J189 Pneumonia, unspecified organism: Secondary | ICD-10-CM

## 2020-03-12 DIAGNOSIS — G9341 Metabolic encephalopathy: Secondary | ICD-10-CM

## 2020-03-12 LAB — CBC WITH DIFFERENTIAL/PLATELET
Abs Immature Granulocytes: 0.96 10*3/uL — ABNORMAL HIGH (ref 0.00–0.07)
Basophils Absolute: 0.1 10*3/uL (ref 0.0–0.1)
Basophils Relative: 0 %
Eosinophils Absolute: 0.2 10*3/uL (ref 0.0–0.5)
Eosinophils Relative: 1 %
HCT: 22.9 % — ABNORMAL LOW (ref 36.0–46.0)
Hemoglobin: 7.6 g/dL — ABNORMAL LOW (ref 12.0–15.0)
Immature Granulocytes: 4 %
Lymphocytes Relative: 12 %
Lymphs Abs: 2.9 10*3/uL (ref 0.7–4.0)
MCH: 27.7 pg (ref 26.0–34.0)
MCHC: 33.2 g/dL (ref 30.0–36.0)
MCV: 83.6 fL (ref 80.0–100.0)
Monocytes Absolute: 1.2 10*3/uL — ABNORMAL HIGH (ref 0.1–1.0)
Monocytes Relative: 5 %
Neutro Abs: 18.9 10*3/uL — ABNORMAL HIGH (ref 1.7–7.7)
Neutrophils Relative %: 78 %
Platelets: 567 10*3/uL — ABNORMAL HIGH (ref 150–400)
RBC: 2.74 MIL/uL — ABNORMAL LOW (ref 3.87–5.11)
RDW: 13.4 % (ref 11.5–15.5)
WBC: 24.2 10*3/uL — ABNORMAL HIGH (ref 4.0–10.5)
nRBC: 0 % (ref 0.0–0.2)

## 2020-03-12 LAB — COMPREHENSIVE METABOLIC PANEL
ALT: 9 U/L (ref 0–44)
AST: 11 U/L — ABNORMAL LOW (ref 15–41)
Albumin: 1.6 g/dL — ABNORMAL LOW (ref 3.5–5.0)
Alkaline Phosphatase: 161 U/L — ABNORMAL HIGH (ref 38–126)
Anion gap: 8 (ref 5–15)
BUN: 20 mg/dL (ref 6–20)
CO2: 22 mmol/L (ref 22–32)
Calcium: 8.3 mg/dL — ABNORMAL LOW (ref 8.9–10.3)
Chloride: 103 mmol/L (ref 98–111)
Creatinine, Ser: 0.99 mg/dL (ref 0.44–1.00)
GFR calc Af Amer: >60 mL/min (ref >60)
GFR calc non Af Amer: >60 mL/min (ref >60)
Glucose, Bld: 106 mg/dL — ABNORMAL HIGH (ref 70–99)
Potassium: 4.3 mmol/L (ref 3.5–5.1)
Sodium: 133 mmol/L — ABNORMAL LOW (ref 135–145)
Total Bilirubin: 0.7 mg/dL (ref 0.3–1.2)
Total Protein: 6.9 g/dL (ref 6.5–8.1)

## 2020-03-12 LAB — GLUCOSE, CAPILLARY
Glucose-Capillary: 104 mg/dL — ABNORMAL HIGH (ref 70–99)
Glucose-Capillary: 104 mg/dL — ABNORMAL HIGH (ref 70–99)
Glucose-Capillary: 117 mg/dL — ABNORMAL HIGH (ref 70–99)
Glucose-Capillary: 209 mg/dL — ABNORMAL HIGH (ref 70–99)
Glucose-Capillary: 212 mg/dL — ABNORMAL HIGH (ref 70–99)
Glucose-Capillary: 81 mg/dL (ref 70–99)

## 2020-03-12 LAB — BASIC METABOLIC PANEL
Anion gap: 11 (ref 5–15)
BUN: 19 mg/dL (ref 6–20)
CO2: 19 mmol/L — ABNORMAL LOW (ref 22–32)
Calcium: 8 mg/dL — ABNORMAL LOW (ref 8.9–10.3)
Chloride: 102 mmol/L (ref 98–111)
Creatinine, Ser: 0.93 mg/dL (ref 0.44–1.00)
GFR calc Af Amer: >60 mL/min (ref >60)
GFR calc non Af Amer: >60 mL/min (ref >60)
Glucose, Bld: 112 mg/dL — ABNORMAL HIGH (ref 70–99)
Potassium: 4 mmol/L (ref 3.5–5.1)
Sodium: 132 mmol/L — ABNORMAL LOW (ref 135–145)

## 2020-03-12 LAB — BLOOD GAS, ARTERIAL
Acid-base deficit: 5 mmol/L — ABNORMAL HIGH (ref 0.0–2.0)
Allens test (pass/fail): POSITIVE — AB
Bicarbonate: 18.6 mmol/L — ABNORMAL LOW (ref 20.0–28.0)
FIO2: 0.28
O2 Saturation: 90.9 %
Patient temperature: 37
pCO2 arterial: 28 mmHg — ABNORMAL LOW (ref 32.0–48.0)
pH, Arterial: 7.43 (ref 7.350–7.450)
pO2, Arterial: 59 mmHg — ABNORMAL LOW (ref 83.0–108.0)

## 2020-03-12 LAB — MAGNESIUM: Magnesium: 1.8 mg/dL (ref 1.7–2.4)

## 2020-03-12 LAB — VITAMIN B12: Vitamin B-12: 439 pg/mL (ref 180–914)

## 2020-03-12 LAB — PHOSPHORUS: Phosphorus: 3.7 mg/dL (ref 2.5–4.6)

## 2020-03-12 LAB — TSH: TSH: 0.532 u[IU]/mL (ref 0.350–4.500)

## 2020-03-12 LAB — VANCOMYCIN, TROUGH: Vancomycin Tr: 30 ug/mL (ref 15–20)

## 2020-03-12 MED ORDER — MORPHINE SULFATE (PF) 2 MG/ML IV SOLN
2.0000 mg | INTRAVENOUS | Status: DC | PRN
  Administered 2020-03-15: 2 mg via INTRAVENOUS
  Filled 2020-03-12: qty 1

## 2020-03-12 MED ORDER — FUROSEMIDE 10 MG/ML IJ SOLN
40.0000 mg | Freq: Once | INTRAMUSCULAR | Status: AC
  Administered 2020-03-12: 40 mg via INTRAVENOUS
  Filled 2020-03-12: qty 4

## 2020-03-12 MED ORDER — SODIUM CHLORIDE 0.9 % IV SOLN: INTRAVENOUS | Status: DC

## 2020-03-12 MED ORDER — VANCOMYCIN VARIABLE DOSE PER UNSTABLE RENAL FUNCTION (PHARMACIST DOSING): Status: DC

## 2020-03-12 MED ORDER — SODIUM CHLORIDE 0.9 % IV SOLN
2.0000 g | INTRAVENOUS | Status: DC
  Administered 2020-03-12 – 2020-03-14 (×3): 2 g via INTRAVENOUS
  Filled 2020-03-12: qty 20
  Filled 2020-03-12 (×3): qty 2

## 2020-03-12 MED ORDER — SODIUM CHLORIDE 0.9 % IV SOLN
500.0000 mg | INTRAVENOUS | Status: DC
  Administered 2020-03-12 – 2020-03-14 (×3): 500 mg via INTRAVENOUS
  Filled 2020-03-12 (×4): qty 500

## 2020-03-12 MED ORDER — LEVALBUTEROL HCL 0.63 MG/3ML IN NEBU
0.6300 mg | INHALATION_SOLUTION | Freq: Four times a day (QID) | RESPIRATORY_TRACT | Status: DC
  Administered 2020-03-12 – 2020-03-13 (×3): 0.63 mg via RESPIRATORY_TRACT
  Filled 2020-03-12 (×7): qty 3

## 2020-03-12 MED ORDER — IPRATROPIUM BROMIDE 0.02 % IN SOLN
0.5000 mg | Freq: Four times a day (QID) | RESPIRATORY_TRACT | Status: DC
  Administered 2020-03-12 – 2020-03-13 (×3): 0.5 mg via RESPIRATORY_TRACT
  Filled 2020-03-12 (×3): qty 2.5

## 2020-03-12 MED ORDER — VANCOMYCIN HCL 1500 MG/300ML IV SOLN
1500.0000 mg | INTRAVENOUS | Status: DC
  Administered 2020-03-13 – 2020-03-15 (×3): 1500 mg via INTRAVENOUS
  Filled 2020-03-12 (×5): qty 300

## 2020-03-12 NOTE — Progress Notes (Signed)
[facility] Daily Progress Note  Subjective: Patient without complaint this AM. Seems confused   Objective: Vitals:   03/11/20 1513 03/11/20 2200 03/11/20 2356 03/12/20 0722  BP: 99/61  (!) 99/59 119/70  Pulse: 99  100 94  Resp: 16  20 17   Temp: 98.8 F (37.1 C) 97.8 F (36.6 C) 98.8 F (37.1 C) 98.8 F (37.1 C)  TempSrc: Oral Oral Oral Oral  SpO2: 90% 93% 93% 91%  Weight:      Height:        Intake/Output Summary (Last 24 hours) at 03/12/2020 1055 Last data filed at 03/11/2020 1500 Gross per 24 hour  Intake 284.35 ml  Output --  Net 284.35 ml   Physical Exam: A&Ox1, NAD CV: RRR Pulmonary: CTA Bilaterally Abdomen: Soft, Nontender, Nondistended Vascular:  Right Lower Extremity: Thigh soft. Calf soft. Podiatry dressing clean dry and intact.   Laboratory: CBC    Component Value Date/Time   WBC 24.2 (H) 03/12/2020 0117   HGB 7.6 (L) 03/12/2020 0117   HGB 11.6 (L) 10/23/2012 1807   HCT 22.9 (L) 03/12/2020 0117   HCT 34.0 (L) 10/23/2012 1807   PLT 567 (H) 03/12/2020 0117   PLT 367 10/23/2012 1807   BMET    Component Value Date/Time   NA 132 (L) 03/12/2020 0649   NA 134 (L) 10/23/2012 1807   K 4.0 03/12/2020 0649   K 4.2 10/23/2012 1807   CL 102 03/12/2020 0649   CL 103 10/23/2012 1807   CO2 19 (L) 03/12/2020 0649   CO2 23 10/23/2012 1807   GLUCOSE 112 (H) 03/12/2020 0649   GLUCOSE 238 (H) 10/23/2012 1807   BUN 19 03/12/2020 0649   BUN 8 10/23/2012 1807   CREATININE 0.93 03/12/2020 0649   CREATININE 0.59 10/25/2012 1207   CALCIUM 8.0 (L) 03/12/2020 0649   CALCIUM 8.6 10/23/2012 1807   GFRNONAA >60 03/12/2020 0649   GFRNONAA >60 10/23/2012 1807   GFRAA >60 03/12/2020 0649   GFRAA >60 10/23/2012 1807   Assessment/Planning: The patient is a 43 year old female who presented with right lower extremity wound / cellulitis s/p debridement by podiatry.  1) adequate arterial blood flow was not noted during surgery. 2) patient with  multiple risk factors for peripheral artery disease 3) we will plan on a right lower extremity angiogram with possible intervention and attempt assess the patient's anatomy and contributing degree of peripheral artery disease. If appropriate an attempt to revascularize leg can manage the time. 4) we will plan on angiogram either Tuesday or Wednesday.  Discussed with Dr. Eber Hong Laney Bagshaw PA-C 03/12/2020 10:55 AM

## 2020-03-12 NOTE — Progress Notes (Signed)
Patients husband called and update on patient new orders for CXR and CT of head. He also states that " Heavy set girl not allowed to visit" is patients cousin and gets her all upset

## 2020-03-12 NOTE — H&P (View-Only) (Signed)
[facility] Daily Progress Note  Subjective: Patient without complaint this AM. Seems confused   Objective: Vitals:   03/11/20 1513 03/11/20 2200 03/11/20 2356 03/12/20 0722  BP: 99/61  (!) 99/59 119/70  Pulse: 99  100 94  Resp: 16  20 17   Temp: 98.8 F (37.1 C) 97.8 F (36.6 C) 98.8 F (37.1 C) 98.8 F (37.1 C)  TempSrc: Oral Oral Oral Oral  SpO2: 90% 93% 93% 91%  Weight:      Height:        Intake/Output Summary (Last 24 hours) at 03/12/2020 1055 Last data filed at 03/11/2020 1500 Gross per 24 hour  Intake 284.35 ml  Output --  Net 284.35 ml   Physical Exam: A&Ox1, NAD CV: RRR Pulmonary: CTA Bilaterally Abdomen: Soft, Nontender, Nondistended Vascular:  Right Lower Extremity: Thigh soft. Calf soft. Podiatry dressing clean dry and intact.   Laboratory: CBC    Component Value Date/Time   WBC 24.2 (H) 03/12/2020 0117   HGB 7.6 (L) 03/12/2020 0117   HGB 11.6 (L) 10/23/2012 1807   HCT 22.9 (L) 03/12/2020 0117   HCT 34.0 (L) 10/23/2012 1807   PLT 567 (H) 03/12/2020 0117   PLT 367 10/23/2012 1807   BMET    Component Value Date/Time   NA 132 (L) 03/12/2020 0649   NA 134 (L) 10/23/2012 1807   K 4.0 03/12/2020 0649   K 4.2 10/23/2012 1807   CL 102 03/12/2020 0649   CL 103 10/23/2012 1807   CO2 19 (L) 03/12/2020 0649   CO2 23 10/23/2012 1807   GLUCOSE 112 (H) 03/12/2020 0649   GLUCOSE 238 (H) 10/23/2012 1807   BUN 19 03/12/2020 0649   BUN 8 10/23/2012 1807   CREATININE 0.93 03/12/2020 0649   CREATININE 0.59 10/25/2012 1207   CALCIUM 8.0 (L) 03/12/2020 0649   CALCIUM 8.6 10/23/2012 1807   GFRNONAA >60 03/12/2020 0649   GFRNONAA >60 10/23/2012 1807   GFRAA >60 03/12/2020 0649   GFRAA >60 10/23/2012 1807   Assessment/Planning: The patient is a 44 year old female who presented with right lower extremity wound / cellulitis s/p debridement by podiatry.  1) adequate arterial blood flow was not noted during surgery. 2) patient with  multiple risk factors for peripheral artery disease 3) we will plan on a right lower extremity angiogram with possible intervention and attempt assess the patient's anatomy and contributing degree of peripheral artery disease. If appropriate an attempt to revascularize leg can manage the time. 4) we will plan on angiogram either Tuesday or Wednesday.  Discussed with Dr. Eber Hong Kaelen Caughlin PA-C 03/12/2020 10:55 AM

## 2020-03-12 NOTE — Progress Notes (Signed)
Patient yelling out for her husband called patients husband at phone number he provided and left vm for him to call facility , patient asking for her cell phone, provided her bag for her and cell phone not present

## 2020-03-12 NOTE — Progress Notes (Signed)
Patient noted to have watery stools x 2.

## 2020-03-12 NOTE — Evaluation (Addendum)
Clinical/Bedside Swallow Evaluation Patient Details  Name: Kristy Mcmillan MRN: [id number] Date of Birth: May 05, 1976  Today's Date: 03/12/2020 Time:        Past Medical History:  Past Medical History:  Diagnosis Date  . Diabetes mellitus without complication ([place])   . Seizures ([place]) 2005 Last sz   childhood febrile sz, then ?eclampsia   Past Surgical History:  Past Surgical History:  Procedure Laterality Date  . AMPUTATION TOE Right 03/10/2020   Procedure: AMPUTATION FIFTH RAY WITH IRRIGATION AND DEBRIDEMENT RIGHT FOOT;  Surgeon: Sharlotte Alamo, DPM;  Location: [facility] ORS;  Service: Podiatry;  Laterality: Right;  . CESAREAN SECTION    . CESAREAN SECTION N/A 03/02/2013   Procedure: CESAREAN SECTION;  Surgeon: Florian Buff, MD;  Location: [facility];  Service: Obstetrics;  Laterality: N/A;  Repeat Cesarean Section Delivery Nonviable Baby Girl @ 2158   HPI:  Pt is a 44 year old overweight Caucasian female with a past medical history significant for but not limited to is mellitus type II, history of seizure disorder who presented to the emergency room with acute onset of worsening right foot swelling associated with erythema, pain and tenderness.  She had a pedicure on 02/27/2020 before symptoms and began having a fever over the last couple days associated with chills. Pt also has a h/o GERD. CXR (9/13): patchy bilateral airspace disease, small R pleural effusion, low lung volume. CT normal.    Assessment / Plan / Recommendation Clinical Impression  Pt appears to present w/ grossly adequate oropharyngeal phase swallowing function w/ No consistent overt clinical s/s of aspiration during/post po trials given when following general aspiration precautions.During po trials, pt consumed all consistencies w/ no decline in vocal quality or change in respiratory presentation during/post trials. During PO trials of thin liquids via cup, pt coughed 1x when clinician used full-assist for presentation of cup--  possibly d/t pt's cognitive status & need for hand-to-mouth motion to cue integration of proprioceptive information for bolus acceptance. When allowed to self-feed using cup (No Straws) for thin liquids, no coughing or other overt s/s of aspiration were noted for 10/10 trials. No coughing or other overt s/s of aspiration noted during PO trials of puree and solid consistencies-- self-fed w/ min assist using spoon. Oral phase appears min-slower d/t prolonged mastication-- possibly d/t overall deconditioning and apparent confusion, as well as lacking sufficient dentition for effective mastication. Unsure of pt's cognitive baseline. Otherwise oral phase appears WFL with respect to bolus management and control of bolus propulsion for A-P transfer for swallowing. Oral clearing achieved w/ all trial consistencies. OM Exam appeared [facility] w/ no unilateral weakness noted. Pt missing dentition. Speech Clear. Pt fed self w/ full setup support, but would benefit from support & monitoring at all meals.  Recommend a Regular consistency diet w/ well-cut meats, moistened foods; Thin liquidsVIA CUP - NO STRAWS. Recommend general aspiration precautions, Pills WHOLE in Puree for safer, easier swallowing. Education provided to pt & husband/partner who was present throughout--general aspiration precautions reviewed w/ him & posted in room. Husband stated he would help her w/ all meals. NSG to reconsult if any new needs arise. MD/NSG agreed.  SLP Visit Diagnosis: Dysphagia, unspecified (R13.10);Other (comment)    Aspiration Risk  Mild aspiration risk; but reduced following aspiration precautions   Diet Recommendation  Regular consistency diet w/ well-cut meats, moistened foods; Thin liquidsVIA CUP - NO STRAWS. Supervision w/ all meals.   Medication Administration: Whole meds with puree    Other  Recommendations Oral Care  Recommendations: Oral care BID;Staff/trained caregiver to provide oral care   Follow up Recommendations    N/a     Frequency and Duration  N/a          Prognosis Prognosis for Safe Diet Advancement: Good Barriers to Reach Goals: Time post onset      Swallow Study   General Date of Onset: 03/08/20 HPI: Pt is a 44 year old overweight Caucasian female with a past medical history significant for but not limited to is mellitus type II, history of seizure disorder who presented to the emergency room with acute onset of worsening right foot swelling associated with erythema, pain and tenderness.  She had a pedicure on 02/27/2020 before symptoms and began having a fever over the last couple days associated with chills. Pt also has a h/o GERD. CXR (9/13): patchy bilateral airspace disease, small R pleural effusion, low lung volume. CT normal.  Type of Study: Bedside Swallow Evaluation Diet Prior to this Study: Regular (heart healthy) Temperature Spikes Noted: Yes Respiratory Status: Room air History of Recent Intubation: No Behavior/Cognition: Alert;Cooperative;Confused;Lethargic/Drowsy Oral Cavity Assessment: Within Functional Limits Oral Care Completed by SLP: Recent completion by staff Oral Cavity - Dentition: Missing dentition Vision: Functional for self-feeding Self-Feeding Abilities: Able to feed self;Needs assist;Needs set up Patient Positioning: Upright in bed (Needed compelte positioning) Baseline Vocal Quality: Normal Volitional Cough: Strong    Oral/Motor/Sensory Function Overall Oral Motor/Sensory Function: Within functional limits   Ice Chips Ice chips: Not tested   Thin Liquid Thin Liquid: Within functional limits Presentation: Cup;Self Fed Other Comments:  (w/ clincian set up)    Nectar Thick Nectar Thick Liquid: Not tested   Honey Thick Honey Thick Liquid: Not tested   Puree Puree: Within functional limits Presentation: Self Fed;Spoon Other Comments:  (clin assist holding cup)   Solid     Solid: Within functional limits Presentation: Self Fed;Spoon Other Comments:   (moistened in puree )      Quintella Baton 03/12/2020,3:10 PM   The information in this patient note, response to treatment, and overall treatment plan developed has been reviewed and agreed upon by this clinician. Orinda Kenner, MS, [place] Speech Language Pathologist [facility] [phone]

## 2020-03-12 NOTE — Progress Notes (Signed)
Pts vanc trough at 30. Pharmacy notified.

## 2020-03-12 NOTE — Progress Notes (Signed)
Pharmacy Antibiotic Note  Kristy Mcmillan is a 44 y.o. female admitted on 03/09/2020 with cellulitis.  Pharmacy has been consulted for vanc/cefepime dosing. Patient received vanc 1.5g IV load in the ED and was started on  vanc 1g IV q12h.  9/13 @ 0117 Vanc trough 30.   Bcx: NG TD.   Wound Cx: Staph aureus   Plan: Ke: 0.044   T1/2: 15.75  Vd: 53        Goal trough 15 - 20 mcg/mL  Will adjust vancomycin to 1500mg  IV every 24 hours.Calculated trough: 15.4  Will monitor renal function closely and adjust doses as needed.   Will continue cefepime 2g IV q8h per CrCl > 60 ml/min.  Continue to follow cultures and sensitivities.   Height: 5\' 4"  (162.6 cm) Weight: 75.8 kg (167 lb) IBW/kg (Calculated) : 54.7  Temp (24hrs), Avg:98.6 F (37 C), Min:97.8 F (36.6 C), Max:98.8 F (37.1 C)  Recent Labs  Lab 03/08/20 1925 03/08/20 1925 03/09/20 0514 03/10/20 0458 03/11/20 0911 03/12/20 0117 03/12/20 0649  WBC 26.8*  --  24.0* 25.6* 28.4* 24.2*  --   CREATININE 1.17*   < > 1.09* 0.75 0.96 0.99 0.93  LATICACIDVEN 1.3  --   --   --   --   --   --   VANCOTROUGH  --   --   --   --   --  30*  --    < > = values in this interval not displayed.    Estimated Creatinine Clearance: 76.9 mL/min (by C-G formula based on SCr of 0.93 mg/dL).    Allergies  Allergen Reactions  . Dilantin [Phenytoin Sodium Extended] Other (See Comments)    Causes seizures  . Phenobarbital Other (See Comments)    Causes seizures Other reaction(s): Unknown Causes seizures  . Phenytoin Sodium Extended     Other reaction(s): Other (See Comments) Causes seizures  . Aspartame Diarrhea and Nausea And Vomiting  . Aspartame And Phenylalanine Diarrhea and Nausea And Vomiting  . Carbamazepine Other (See Comments)    Other reaction(s): Unknown  . Gabapentin Other (See Comments)    Other reaction(s): Unknown Feels like she is going to have a seizure  . Ciprofloxacin Rash    blisters  . Humalog [Insulin Lispro] Rash  .  Lantus [Insulin Glargine] Rash  . Novolin 70-30 [Insulin Nph Isophane & Regular] Itching and Rash  . Penicillins Itching and Rash    Thank you for allowing pharmacy to be a part of this patient's care.  Pernell Dupre, PharmD, BCPS Clinical Pharmacist 03/12/2020 9:13 AM

## 2020-03-12 NOTE — Progress Notes (Signed)
Phone number [phone] . [place] phone number .Marland Kitchen

## 2020-03-12 NOTE — Progress Notes (Signed)
PROGRESS NOTE    Kristy Mcmillan  ZOX:[id number] DOB: 1975/08/07 DOA: 03/09/2020 [facility]  Brief Narrative:  The patient is a 44 year old overweight Caucasian female with a past medical history significant for but not limited to is mellitus type II, history of seizure disorder who presented to the emergency room with acute onset of worsening right foot swelling associated with erythema, pain and tenderness.  She had a pedicure on 02/27/2020 before symptoms and began having a fever over the last couple days associated with chills.  She denies any nausea or vomiting.  She is not vaccinated against COVID-19 disease and upon presentation she is found to be febrile with a tachycardia and a respiratory rate of 29.  Labs revealed borderline potassium of 2.5 and she did have a hyponatremia and hypochloremia and elevated blood sugar with a hyperglycemia of 428.  In the ED sepsis protocol was started and she is given acetaminophen, IV cefepime, Flagyl and IV vancomycin as well as morphine sulfate IV Zofran.  Her potassium was repleted as well.  She was admitted for sepsis secondary to right foot extremity cellulitis with right fifth toe necrosis in the setting of an uncontrolled diabetic foot.    **Interim History She had bedside debridement done by podiatry when they were consulted. MRI was finally done. Vascular surgery evaluated and considering an angiogram early next week.  Continues to still be septic and had a temperature, tachycardia and tachypnea overnight which is now improved.  She also started having some loose stools likely antibiotic mediated.  03/11/20 MRI done yesterday showed "Exam significantly limited by patient motion and lack of contrast. 2. 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 3. Suspect large dorsal and smaller plantar forefoot abscesses as detailed above. 4.  Diffuse myofasciitis without obvious pyomyositis."   03/12/20 She was extremely confused today and would not answer questions appropriately.  And overnight her O2 requirements worsen and she needed 5 L.  She got put on oxygen after her surgical procedure and was not weaned off of it and now on 5 L.  Chest x-ray today showed multifocal pneumonia so will obtain a CT scan.  We will also give her IV Lasix, breathing treatments and because of her confusion and altered mental status will obtain a head CT scan without contrast, and ABG, TSH, RPR, vitamin B12 level.  Because they could not consent for the angiogram today given her confusion they are going to postpone it and attempted on Tuesday or Wednesday.  Assessment & Plan:   Active Problems:   Lower extremity cellulitis  Sepsis 2/2Right lower extremity/mainly foot severe cellulitis septic arthritis of the fifth MTP joint with osteomyelitis including the fifth metatarsal head and fifth proximal phalanx along with multiple abscesses in the setting of uncontrolled type 2 diabetes mellitus and diabetic foot.  Status post incision and debridement postoperative day 2 Multifocal PNA -The patient has subsequent sepsis without severe sepsis or septic shock as manifested by significant leuko-cytosis, fever, tachycardia and tachypnea; overnight she is febrile again with a temperature of 101.5, tachycardic with a pulse rate of 110, and respiratory rate 21 -She subsequently now ended up on O2 after her Surgical Procedure and ? Aspiration. Her O2 requirements have worsened over night -CXR overnight showed "Patchy bilateral airspace disease concerning for multifocal pneumonia. Small right pleural effusion. Low lung volumes." -DG of the foot showed "No fracture or malalignment. Vascular calcifications.  Small plantar calcaneal spur. Joint spaces are maintained." -The patient will be admitted to a medically monitored bed. -We will continue antibiotic therapy with IV  cefepime and vancomycin.  We have stopped IV Flagyl and changed from IV Zosyn to IV cefepime; will further de-escalate given low risk of Pseudomonas and tends to ceftriaxone and cover her for atypicals given her multifocal pneumonia and start azithromycin as well -Wound care consult to be obtained. -Podiatry consult has been obtained and they request vascular surgery evaluation as well; podiatry evaluated and she does have a full-thickness ulceration with abscess and early gangrenous changes to the right fifth toe as well as superficial abscess with underlying superficial ulceration of the right arch and so podiatry debrided the devitalized tissue from the ulceration of the fifth toe and debrided the devitalized tissue from the plantar ulceration sharply using the nippers and have ordered an MRI of the foot.  Dr. Cleda Mccreedy discussed with the patient that she is likely going to need some type of I&D for infection in her right foot is significantly at risk for losing her toe with a right fifth toe amputation -Vascular surgery has been consulted as well and I recommend following up on MRI and podiatry recommendations and await MRI results.  They are considering an angiogram early next week -We'll check ABIs and showed the following:  "Right:  Resting ABI of the right lower extremity likely falsely elevated, potentially from noncompressible vessels.  Segmental Doppler at the right ankle demonstrates evidence of developing occlusive disease, though waveforms maintained.  Left:  Resting ABI within normal limits.  Segmental Doppler at the left ankle demonstrates evidence of development occlusive disease, with waveforms relatively maintained."   -Podiatry consulted and recommending further imaging with an MRI; -MRI done yesterday showed "Exam significantly limited by patient motion and lack of contrast. 2. Findings worrisome for septic arthritis at the fifth MTP joint with osteomyelitis involving the  fifth metatarsal head and fifth proximal phalanx. 3. Suspect large dorsal and smaller plantar forefoot abscesses as detailed above. 4. Diffuse myofasciitis without obvious pyomyositis."  -Blood cultures x2 have been obtained and showed no growth to date 2 day -Given the likelihood of underlying tinea pedis that could be preceding bacterial superinfection, she was started n Lamisil cream twice daily. -IV fluid hydration will now be discontinued and she will be given a dose of IV Lasix given her worsening respiratory failure -Check CT scan of the chest -Add breathing treatments -Continue to follow blood cultures and temperature curve and repeat CBC in a.m. -Wound culture taken on 03/09/2020 and showed rare WBCs present and culture grew out few Staph aureus with susceptibilities to follow -Wound culture taken intraoperatively on 03/10/2020 showed few WBCs present predominantly PMNs and few gram-positive cocci in pairs and clusters with culture pending -WBC on admission was 26.8 -> 24.0 -> 25.6 -> 28.4 -> 24.2 -Podiatry discussed with the patient about needing amputation of the right fifth toe with ray resection as well as I&D of abscessed areas.  She was taken to the OR in the evening and had a right fifth toe amputation with partial ray resection and I&D of multiple abscesses at multiple sites in the right foot. -He was given Betadine gauze and this was applied to all incision areas followed by bulky gauze and a Kerlix bandage.  Podiatry is going to monitor continue daily.  But due to the amount of infection the patient is still at risk for amputation of the right lower extremity and podiatry  recommends vascular surgery evaluation with angiogram for circulation status as well as higher amputation as needed -Continue supportive care and continue with antiemetics 4 mg p.o. every 6 as needed for nausea -We will have vascular follow-up and she is supposed to undergo an angiogram today but she was much more  confused and unable to consent for it so this was postponed  Confusion and Encephalopathy -Acute -In the setting of possible multifocal pneumonia and worsening respiratory status -WBC still remains elevated-chest x-ray showed concern for multifocal pneumonia -We will check CT of the head and CT scan of the chest -CT of the chest is pending but CTs scan of the head was negative -Check ABG, RPR, TSH and ammonia level as well -ABG done and showed a pH of 7.43, PCO2 of 28, PO2 of 59, ABG O2 saturation of 90.8% with a bicarbonate level of 18.6 -Give a dose of IV Lasix 40 mg x 1 -Check SLP evaluation -De-escalate IV cefepime to IV ceftriaxone and azithromycin given atypicals -Likely this is infectious and if she is still not improving will consider MRI and consultation to neurology -Continue to monitor very closely and place on delirium precautions  Uncontrolled Hyperglycemia in the setting of type 2 diabetes mellitus -Patient hemoglobin A1c was 9.7 -The patient will be placed on resistant subcutaneous NovoLog with frequent fingerstick blood glucose measures in addition to hydration with IV normal saline however she was changed to sensitive NovoLog sliding scale before meals and at bedtime given her hypoglycemic episode -We will continue basal coverage Levemir 10 units subcu and p.o. Tradjenta. -CBGs ranging from 81-117  Essential Hypertension. -Continue with Lisinopril 2.5 mg p.o. daily  Hyperlipidemia -Continue with Atorvastatin 40 g p.o. nightly  GERD. -PPI therapy will be resumed. May need to be changed to IV  Hyponatremia/Hypochloremia -Mild patient's sodium is now 132 -Patient's chloride level has gone from 93 -> 96 -> 100 -> 102 -IV fluid hydration is now stopped -Continue monitor and trend and repeat CMP in a.m.  Loose Stools -In the setting of Antibiotics, and now improving -WBC is elevated but likely from Severe Cellulitis -Continue to Monitor and if worsens consider  checking C Difficile and GI Pathogen Panel -Hold all Laxatives and if necessary Hold PPI  Hypokalemia -Patient's potassium this morning was 4.0 -Check Magnesium Level and was 1.8 -Continue to Monitor and Replete as Necessary -Repeat CMP in the AM  Thrombocytosis -Patient's platelet count on admission was 570 and went to 532 -> 454 -> 530 -> 567 today -Likely is Reactive in the setting of Infection -Continue monitor and trend and Repeat CBC in a.m.  Mild AKI Metabolic acidosis, mild -Patient's BUNs/creatinine on admission was 18/1.17 and improving and is now 19/0.93 -Patient's CO2 is now 19, anion gap is 11, chloride level was 102 -Baseline appears to be from 0.6-0.8 -Continue with IV fluid hydration as above and reduce rate from 100 mL/hr to 75 mL/hr -Avoid further nephrotoxic medications, contrast dyes, and hypotension and renally adjust medications  -Repeat CBC in a.m.  Normocytic Anemia -Patient's hemoglobin/hematocrit admission was 10.5/30.8 and is now dropped to 7.6/22.9 likely in the setting of a dilutional drop from IV fluids -Continue with IV fluids as above  -Check anemia panel in the morning -Continue to monitor for signs and symptoms of bleeding; currently no overt bleeding noted -Repeat CBC in the AM   DiabeticPeripheral Neuropathy. -Continue with Duloxetine 30 mg q. twice daily  History of Seizures  -Continue with Zonisamide 100 g p.o. daily  -  Has had childhood febrile seizure and questionable eclampsia  Hypomagnesemia -Patient's magnesium level is now 1.8 -Continue to monitor and replete as necessary -Repeat Magnesium Level in the AM  DVT prophylaxis: Enoxaparin 40 mg sq q24h Code Status: FULL CODE  Family Communication: No family currently at bedside Disposition Plan: Pending further evaluation by Orthopedic Surgery and Vascular Surgery: She is now altered and will need to be at baseline prior to safe discharge  Status is: Inpatient  Remains  inpatient appropriate because:Altered mental status, Ongoing diagnostic testing needed not appropriate for outpatient work up, IV treatments appropriate due to intensity of illness or inability to take PO and Inpatient level of care appropriate due to severity of illness   Dispo: The patient is from: Home              Anticipated d/c is to: TBD              Anticipated d/c date is: 2 days minimum              Patient currently is not medically stable to d/c.  Consultants:   Podiatry  Vascular Surgery   Procedures:  ABI's IMPRESSION: Right:  Resting ABI of the right lower extremity likely falsely elevated, potentially from noncompressible vessels.  Segmental Doppler at the right ankle demonstrates evidence of developing occlusive disease, though waveforms maintained.  Left:  Resting ABI within normal limits.  Segmental Doppler at the left ankle demonstrates evidence of development occlusive disease, with waveforms relatively maintained.  Antimicrobials:  Anti-infectives (From admission, onward)   Start     Dose/Rate Route Frequency Ordered Stop   03/13/20 0200  vancomycin (VANCOREADY) IVPB 1500 mg/300 mL        1,500 mg 150 mL/hr over 120 Minutes Intravenous Every 24 hours 03/12/20 0915     03/12/20 2200  cefTRIAXone (ROCEPHIN) 2 g in sodium chloride 0.9 % 100 mL IVPB        2 g 200 mL/hr over 30 Minutes Intravenous Every 24 hours 03/12/20 1535     03/12/20 1800  azithromycin (ZITHROMAX) 500 mg in sodium chloride 0.9 % 250 mL IVPB        500 mg 250 mL/hr over 60 Minutes Intravenous Every 24 hours 03/12/20 1545     03/12/20 0440  vancomycin variable dose per unstable renal function (pharmacist dosing)  Status:  Discontinued         Does not apply See admin instructions 03/12/20 0440 03/12/20 0915   03/10/20 2018  vancomycin (VANCOCIN) powder  Status:  Discontinued          As needed 03/10/20 2018 03/10/20 2042   03/09/20 1400  vancomycin (VANCOCIN) IVPB 1000 mg/200  mL premix  Status:  Discontinued        1,000 mg 200 mL/hr over 60 Minutes Intravenous Every 12 hours 03/09/20 0301 03/12/20 0440   03/09/20 0900  ceFEPIme (MAXIPIME) 2 g in sodium chloride 0.9 % 100 mL IVPB  Status:  Discontinued        2 g 200 mL/hr over 30 Minutes Intravenous Every 8 hours 03/09/20 0144 03/12/20 1535   03/09/20 0200  vancomycin (VANCOREADY) IVPB 500 mg/100 mL        500 mg 100 mL/hr over 60 Minutes Intravenous  Once 03/09/20 0149 03/09/20 0437   03/09/20 0145  vancomycin (VANCOCIN) IVPB 1000 mg/200 mL premix  Status:  Discontinued        1,000 mg 200 mL/hr over 60 Minutes Intravenous  Once 03/09/20  0144 03/09/20 0149   03/09/20 0030  ceFEPIme (MAXIPIME) 2 g in sodium chloride 0.9 % 100 mL IVPB        2 g 200 mL/hr over 30 Minutes Intravenous  Once 03/09/20 0019 03/09/20 0133   03/09/20 0030  metroNIDAZOLE (FLAGYL) IVPB 500 mg        500 mg 100 mL/hr over 60 Minutes Intravenous  Once 03/09/20 0019 03/09/20 0214   03/09/20 0030  vancomycin (VANCOCIN) IVPB 1000 mg/200 mL premix        1,000 mg 200 mL/hr over 60 Minutes Intravenous  Once 03/09/20 0019 03/09/20 0332        Subjective: Seen and examined at bedside and she was significantly confused and her mentation was not appropriate today. She complained of pain and will barely interact but is very difficult to keep her on track and she appeared very confused and did appear little lethargic. Unable to provide a subjective history and unable to consent for her angiogram this morning given her confusion. No other concerns or complaints at this time but overnight her oxygen requirement worsened and her chest x-ray did show multifocal pneumonia so will obtain further imaging and continue treatment.  Objective: Vitals:   03/11/20 2200 03/11/20 2356 03/12/20 0722 03/12/20 1520  BP:  (!) 99/59 119/70 97/71  Pulse:  100 94 98  Resp:  20 17 18   Temp: 97.8 F (36.6 C) 98.8 F (37.1 C) 98.8 F (37.1 C) (!) 97.4 F (36.3 C)   TempSrc: Oral Oral Oral Oral  SpO2: 93% 93% 91% 94%  Weight:      Height:       No intake or output data in the 24 hours ending 03/12/20 1616 Filed Weights   03/08/20 1911  Weight: 75.8 kg   Examination: Physical Exam:  Constitutional: WN/WD overweight Caucasian female significantly confused today and continues to appear uncomfortable complaining of pain in her foot Eyes: Lids and conjunctivae normal, sclerae anicteric  ENMT: External Ears, Nose appear normal. Grossly normal hearing.  Neck: Appears normal, supple, no cervical masses, normal ROM, no appreciable thyromegaly; no JVD Respiratory: Diminished to auscultation bilaterally with coarse breath sounds and some wheezing and some crackles noted.  Wearing supplemental oxygen via nasal cannula and this is worsened and on 5 L.  Cardiovascular: RRR, no murmurs / rubs / gallops. S1 and S2 auscultated.  Trace extremity edema Abdomen: Soft, non-tender, distended secondary body habitus. No masses palpated. No appreciable hepatosplenomegaly. Bowel sounds positive.  GU: Deferred. Musculoskeletal: No clubbing / cyanosis of digits/nails.  Right foot is wrapped Skin: No rashes, lesions, ulcers on limited skin evaluation. No induration; Warm and dry.  Neurologic: CN 2-12 grossly intact with no focal deficits but is extremely confused.  Romberg sign and cerebellar reflexes were not assessed Psychiatric: Impaired judgment and insight.  She is not alert and oriented x 3. Normal mood and appropriate affect.   Data Reviewed: I have personally reviewed following labs and imaging studies  CBC: Recent Labs  Lab 03/08/20 1925 03/09/20 0514 03/10/20 0458 03/11/20 0911 03/12/20 0117  WBC 26.8* 24.0* 25.6* 28.4* 24.2*  NEUTROABS 22.9*  --  20.5* 23.8* 18.9*  HGB 10.5* 8.7* 8.3* 8.2* 7.6*  HCT 30.8* 25.4* 24.3* 24.9* 22.9*  MCV 81.9 82.2 81.8 84.7 83.6  PLT 578* 432* 454* 530* 585*   Basic Metabolic Panel: Recent Labs  Lab 03/09/20 0514  03/10/20 0458 03/11/20 0911 03/12/20 0117 03/12/20 0649  NA 129* 132* 133* 133* 132*  K 3.4*  3.6 4.4 4.3 4.0  CL 96* 100 102 103 102  CO2 24 20* 20* 22 19*  GLUCOSE 422* 188* 145* 106* 112*  BUN 17 14 17 20 19   CREATININE 1.09* 0.75 0.96 0.99 0.93  CALCIUM 7.8* 8.0* 8.0* 8.3* 8.0*  MG  --  1.5* 1.9 1.8  --   PHOS  --  3.0 3.7 3.7  --    GFR: Estimated Creatinine Clearance: 76.9 mL/min (by C-G formula based on SCr of 0.93 mg/dL). Liver Function Tests: Recent Labs  Lab 03/08/20 1925 03/10/20 0458 03/11/20 0911 03/12/20 0117  AST 11* 12* 12* 11*  ALT 10 9 8 9   ALKPHOS 122 113 131* 161*  BILITOT 0.7 0.9 0.7 0.7  PROT 8.4* 6.3* 7.0 6.9  ALBUMIN 2.4* 1.8* 1.8* 1.6*   No results for input(s): LIPASE, AMYLASE in the last 168 hours. No results for input(s): AMMONIA in the last 168 hours. Coagulation Profile: No results for input(s): INR, PROTIME in the last 168 hours. Cardiac Enzymes: No results for input(s): CKTOTAL, CKMB, CKMBINDEX, TROPONINI in the last 168 hours. BNP (last 3 results) No results for input(s): PROBNP in the last 8760 hours. HbA1C: No results for input(s): HGBA1C in the last 72 hours. CBG: Recent Labs  Lab 03/11/20 2106 03/11/20 2354 03/12/20 0429 03/12/20 0723 03/12/20 1224  GLUCAP 111* 104* 117* 104* 81   Lipid Profile: No results for input(s): CHOL, HDL, LDLCALC, TRIG, CHOLHDL, LDLDIRECT in the last 72 hours. Thyroid Function Tests: Recent Labs    03/12/20 1107  TSH 0.532   Anemia Panel: No results for input(s): VITAMINB12, FOLATE, FERRITIN, TIBC, IRON, RETICCTPCT in the last 72 hours. Sepsis Labs: Recent Labs  Lab 03/08/20 1925  LATICACIDVEN 1.3    Recent Results (from the past 240 hour(s))  Blood culture (routine x 2)     Status: None (Preliminary result)   Collection Time: 03/08/20  7:26 PM   Specimen: BLOOD  Result Value Ref Range Status   Specimen Description BLOOD RIGHT ANTECUBITAL  Final   Special Requests   Final     BOTTLES DRAWN AEROBIC AND ANAEROBIC Blood Culture results may not be optimal due to an excessive volume of blood received in culture bottles   Culture   Final    NO GROWTH 4 DAYS Performed at [facility], [address], [place], [place] [address]    Report Status PENDING  Incomplete  Blood culture (routine x 2)     Status: None (Preliminary result)   Collection Time: 03/09/20 12:55 AM   Specimen: BLOOD  Result Value Ref Range Status   Specimen Description BLOOD LEFT [facility]  Final   Special Requests   Final    BOTTLES DRAWN AEROBIC AND ANAEROBIC Blood Culture adequate volume   Culture   Final    NO GROWTH 3 DAYS Performed at [facility], [address], [place], [place] [address]    Report Status PENDING  Incomplete  SARS Coronavirus 2 by RT PCR (hospital order, performed in [place] hospital lab) Nasopharyngeal Nasopharyngeal Swab     Status: None   Collection Time: 03/09/20 12:55 AM   Specimen: Nasopharyngeal Swab  Result Value Ref Range Status   SARS Coronavirus 2 NEGATIVE NEGATIVE Final    Comment: (NOTE) SARS-CoV-2 target nucleic acids are NOT DETECTED.  The SARS-CoV-2 RNA is generally detectable in upper and lower respiratory specimens during the acute phase of infection. The lowest concentration of SARS-CoV-2 viral copies this assay can detect is 250  copies / mL. A negative result does not preclude SARS-CoV-2 infection and should not be used as the sole basis for treatment or other patient management decisions.  A negative result may occur with improper specimen collection / handling, submission of specimen other than nasopharyngeal swab, presence of viral mutation(s) within the areas targeted by this assay, and inadequate number of viral copies (<250 copies / mL). A negative result must be combined with clinical observations, patient history, and epidemiological information.  Fact Sheet for Patients:    StrictlyIdeas.no  Fact Sheet for Healthcare Providers: BankingDealers.co.za  This test is not yet approved or  cleared by the [place] FDA and has been authorized for detection and/or diagnosis of SARS-CoV-2 by FDA under an Emergency Use Authorization (EUA).  This EUA will remain in effect (meaning this test can be used) for the duration of the COVID-19 declaration under Section 564(b)(1) of the Act, 21 U.S.C. section 360bbb-3(b)(1), unless the authorization is terminated or revoked sooner.  Performed at [facility], [address] [place], [place] [address]   Aerobic/Anaerobic Culture (surgical/deep wound)     Status: None (Preliminary result)   Collection Time: 03/09/20  5:28 PM   Specimen: Wound; Abscess  Result Value Ref Range Status   Specimen Description WOUND RIGHT FOOT  Final   Special Requests NONE  Final   Gram Stain   Final    RARE WBC PRESENT, PREDOMINANTLY PMN NO ORGANISMS SEEN Performed at [facility], [address], [place], [place] [address]    Culture   Final    FEW METHICILLIN RESISTANT STAPHYLOCOCCUS AUREUS NO ANAEROBES ISOLATED; CULTURE IN PROGRESS FOR 5 DAYS    Report Status PENDING  Incomplete   Organism ID, Bacteria METHICILLIN RESISTANT STAPHYLOCOCCUS AUREUS  Final      Susceptibility   Methicillin resistant staphylococcus aureus - MIC*    CIPROFLOXACIN >=8 RESISTANT Resistant     ERYTHROMYCIN >=8 RESISTANT Resistant     GENTAMICIN <=0.5 SENSITIVE Sensitive     OXACILLIN >=4 RESISTANT Resistant     TETRACYCLINE <=1 SENSITIVE Sensitive     VANCOMYCIN <=0.5 SENSITIVE Sensitive     TRIMETH/SULFA 80 RESISTANT Resistant     CLINDAMYCIN <=0.25 SENSITIVE Sensitive     RIFAMPIN <=0.5 SENSITIVE Sensitive     Inducible Clindamycin NEGATIVE Sensitive     * FEW METHICILLIN RESISTANT STAPHYLOCOCCUS AUREUS  Aerobic/Anaerobic Culture (surgical/deep wound)     Status: None (Preliminary  result)   Collection Time: 03/10/20  8:03 PM   Specimen: PATH Other; Wound  Result Value Ref Range Status   Specimen Description   Final    ABSCESS Performed at [facility], [address], [place], [place] [address]    Special Requests DEEP  Final   Gram Stain   Final    FEW WBC PRESENT, PREDOMINANTLY PMN FEW GRAM POSITIVE COCCI IN PAIRS IN CLUSTERS Performed at [facility], [place] [address], [place], [place] [address]    Culture ABUNDANT STAPHYLOCOCCUS AUREUS  Final   Report Status PENDING  Incomplete     RN Pressure Injury Documentation:     Estimated body mass index is 28.67 kg/m as calculated from the following:   Height as of this encounter: 5\' 4"  (1.626 m).   Weight as of this encounter: 75.8 kg.  Malnutrition Type:      Malnutrition Characteristics:      Nutrition Interventions:    Radiology Studies: DG Chest 1 View  Result Date: 03/12/2020 CLINICAL DATA:  Increased shortness of breath. EXAM: CHEST  1 VIEW COMPARISON:  One-view chest x-ray is 07/31/2019 FINDINGS: Heart size is exaggerated by low lung volumes. Patchy bilateral airspace opacities are present. Small effusion is suspected on right. No pneumothorax is present. The visualized soft tissues and bony thorax are unremarkable. IMPRESSION: 1. Patchy bilateral airspace disease concerning for multifocal pneumonia. 2. Small right pleural effusion. 3. Low lung volumes. Electronically Signed   By: San Morelle M.D.   On: 03/12/2020 08:36   CT HEAD WO CONTRAST  Result Date: 03/12/2020 CLINICAL DATA:  Delirium, new from baseline. Recent amputation of toe. EXAM: CT HEAD WITHOUT CONTRAST TECHNIQUE: Contiguous axial images were obtained from the base of the skull through the vertex without intravenous contrast. COMPARISON:  CT head without contrast 02/19/2020 FINDINGS: Brain: No acute infarct, hemorrhage, or mass lesion is present. No significant white matter lesions are present. The ventricles  are of normal size. No significant extraaxial fluid collection is present. The brainstem and cerebellum are within normal limits. Vascular: No hyperdense vessel or unexpected calcification. Skull: Calvarium is intact. No focal lytic or blastic lesions are present. No significant extracranial soft tissue lesion is present. Sinuses/Orbits: The paranasal sinuses and mastoid air cells are clear. The globes and orbits are within normal limits. IMPRESSION: Negative CT of the head. Electronically Signed   By: San Morelle M.D.   On: 03/12/2020 11:41   Scheduled Meds:  atorvastatin  40 mg Oral QHS   calcium-vitamin D  1 tablet Oral Q breakfast   cholecalciferol  1,000 Units Oral Daily   DULoxetine  30 mg Oral BID   enoxaparin (LOVENOX) injection  40 mg Subcutaneous Q24H   insulin aspart  0-5 Units Subcutaneous QHS   insulin aspart  0-9 Units Subcutaneous TID WC   insulin detemir  10 Units Subcutaneous QHS   ipratropium  0.5 mg Nebulization Q6H   levalbuterol  0.63 mg Nebulization Q6H   linagliptin  5 mg Oral Daily   lisinopril  2.5 mg Oral Daily   terbinafine   Topical BID   vitamin B-12  500 mcg Oral Daily   zonisamide  100 mg Oral Daily   Continuous Infusions:  [START ON 03/13/2020] sodium chloride     azithromycin     cefTRIAXone (ROCEPHIN)  IV     [START ON 03/13/2020] vancomycin      LOS: 3 days   Kerney Elbe, DO [facility] PAGER is on AMION  If 7PM-7AM, please contact night-coverage [email or web address]

## 2020-03-12 NOTE — Care Management Important Message (Signed)
Important Message  Patient Details  Name: Kristy Mcmillan MRN: [id number] Date of Birth: Dec 16, 1975   Medicare Important Message Given:  Yes     Dannette Barbara 03/12/2020, 12:02 PM

## 2020-03-13 ENCOUNTER — Encounter: Payer: Self-pay | Admitting: Vascular Surgery

## 2020-03-13 ENCOUNTER — Encounter
Admission: EM | Disposition: A | Discharge status: Home / Self Care | Payer: Self-pay | Source: Home / Self Care | Attending: Internal Medicine

## 2020-03-13 DIAGNOSIS — I70234 Atherosclerosis of native arteries of right leg with ulceration of heel and midfoot: Secondary | ICD-10-CM

## 2020-03-13 HISTORY — PX: LOWER EXTREMITY ANGIOGRAPHY: CATH118251

## 2020-03-13 LAB — CULTURE, BLOOD (ROUTINE X 2): Culture: NO GROWTH

## 2020-03-13 LAB — CBC WITH DIFFERENTIAL/PLATELET
Abs Immature Granulocytes: 1.17 10*3/uL — ABNORMAL HIGH (ref 0.00–0.07)
Basophils Absolute: 0.1 10*3/uL (ref 0.0–0.1)
Basophils Relative: 0 %
Eosinophils Absolute: 0.4 10*3/uL (ref 0.0–0.5)
Eosinophils Relative: 2 %
HCT: 25 % — ABNORMAL LOW (ref 36.0–46.0)
Hemoglobin: 8.3 g/dL — ABNORMAL LOW (ref 12.0–15.0)
Immature Granulocytes: 6 %
Lymphocytes Relative: 17 %
Lymphs Abs: 3.3 10*3/uL (ref 0.7–4.0)
MCH: 28.2 pg (ref 26.0–34.0)
MCHC: 33.2 g/dL (ref 30.0–36.0)
MCV: 85 fL (ref 80.0–100.0)
Monocytes Absolute: 1 10*3/uL (ref 0.1–1.0)
Monocytes Relative: 5 %
Neutro Abs: 13.6 10*3/uL — ABNORMAL HIGH (ref 1.7–7.7)
Neutrophils Relative %: 70 %
Platelets: 637 10*3/uL — ABNORMAL HIGH (ref 150–400)
RBC: 2.94 MIL/uL — ABNORMAL LOW (ref 3.87–5.11)
RDW: 13.2 % (ref 11.5–15.5)
Smear Review: NORMAL
WBC: 19.5 10*3/uL — ABNORMAL HIGH (ref 4.0–10.5)
nRBC: 0.2 % (ref 0.0–0.2)

## 2020-03-13 LAB — COMPREHENSIVE METABOLIC PANEL
ALT: 8 U/L (ref 0–44)
AST: 12 U/L — ABNORMAL LOW (ref 15–41)
Albumin: 1.7 g/dL — ABNORMAL LOW (ref 3.5–5.0)
Alkaline Phosphatase: 139 U/L — ABNORMAL HIGH (ref 38–126)
Anion gap: 10 (ref 5–15)
BUN: 18 mg/dL (ref 6–20)
CO2: 22 mmol/L (ref 22–32)
Calcium: 8.1 mg/dL — ABNORMAL LOW (ref 8.9–10.3)
Chloride: 101 mmol/L (ref 98–111)
Creatinine, Ser: 1.01 mg/dL — ABNORMAL HIGH (ref 0.44–1.00)
GFR calc Af Amer: >60 mL/min (ref >60)
GFR calc non Af Amer: >60 mL/min (ref >60)
Glucose, Bld: 147 mg/dL — ABNORMAL HIGH (ref 70–99)
Potassium: 4.1 mmol/L (ref 3.5–5.1)
Sodium: 133 mmol/L — ABNORMAL LOW (ref 135–145)
Total Bilirubin: 0.4 mg/dL (ref 0.3–1.2)
Total Protein: 7.2 g/dL (ref 6.5–8.1)

## 2020-03-13 LAB — GLUCOSE, CAPILLARY
Glucose-Capillary: 119 mg/dL — ABNORMAL HIGH (ref 70–99)
Glucose-Capillary: 128 mg/dL — ABNORMAL HIGH (ref 70–99)
Glucose-Capillary: 212 mg/dL — ABNORMAL HIGH (ref 70–99)
Glucose-Capillary: 116 mg/dL — ABNORMAL HIGH (ref 70–99)

## 2020-03-13 LAB — PROCALCITONIN: Procalcitonin: 0.37 ng/mL

## 2020-03-13 LAB — AMMONIA: Ammonia: 16 umol/L (ref 9–35)

## 2020-03-13 LAB — MAGNESIUM: Magnesium: 1.8 mg/dL (ref 1.7–2.4)

## 2020-03-13 LAB — PHOSPHORUS: Phosphorus: 2.8 mg/dL (ref 2.5–4.6)

## 2020-03-13 LAB — RPR: RPR Ser Ql: NONREACTIVE

## 2020-03-13 SURGERY — LOWER EXTREMITY ANGIOGRAPHY
Anesthesia: Moderate Sedation | Laterality: Right

## 2020-03-13 MED ORDER — MORPHINE SULFATE (PF) 4 MG/ML IV SOLN: 2.0000 mg | INTRAVENOUS | Status: DC | PRN

## 2020-03-13 MED ORDER — FENTANYL CITRATE (PF) 100 MCG/2ML IJ SOLN
INTRAMUSCULAR | Status: DC | PRN
Start: 2020-03-13 — End: 2020-03-13
  Administered 2020-03-13: 25 ug via INTRAVENOUS
  Administered 2020-03-13: 50 ug via INTRAVENOUS

## 2020-03-13 MED ORDER — MIDAZOLAM HCL 2 MG/2ML IJ SOLN
INTRAMUSCULAR | Status: DC | PRN
  Administered 2020-03-13: 1 mg via INTRAVENOUS
  Administered 2020-03-13: 2 mg via INTRAVENOUS

## 2020-03-13 MED ORDER — SODIUM CHLORIDE 0.9% FLUSH
3.0000 mL | Freq: Two times a day (BID) | INTRAVENOUS | Status: DC
  Administered 2020-03-13 – 2020-03-17 (×6): 3 mL via INTRAVENOUS

## 2020-03-13 MED ORDER — CLOPIDOGREL BISULFATE 75 MG PO TABS
75.0000 mg | ORAL_TABLET | Freq: Every day | ORAL | Status: DC
  Administered 2020-03-17: 75 mg via ORAL
  Filled 2020-03-13 (×4): qty 1

## 2020-03-13 MED ORDER — DIPHENHYDRAMINE HCL 50 MG/ML IJ SOLN: 50.0000 mg | Freq: Once | INTRAMUSCULAR | Status: DC | PRN

## 2020-03-13 MED ORDER — ONDANSETRON HCL 4 MG/2ML IJ SOLN
4.0000 mg | Freq: Four times a day (QID) | INTRAMUSCULAR | Status: DC | PRN
  Administered 2020-03-14 – 2020-03-15 (×3): 4 mg via INTRAVENOUS
  Filled 2020-03-13 (×3): qty 2

## 2020-03-13 MED ORDER — HYDROMORPHONE HCL 1 MG/ML IJ SOLN
1.0000 mg | Freq: Once | INTRAMUSCULAR | Status: AC | PRN
  Administered 2020-03-15: 1 mg via INTRAVENOUS
  Filled 2020-03-13: qty 1

## 2020-03-13 MED ORDER — CLOPIDOGREL BISULFATE 300 MG PO TABS: 300.0000 mg | ORAL_TABLET | ORAL | Status: AC

## 2020-03-13 MED ORDER — ONDANSETRON HCL 4 MG/2ML IJ SOLN
4.0000 mg | Freq: Four times a day (QID) | INTRAMUSCULAR | Status: DC | PRN
  Filled 2020-03-13: qty 2

## 2020-03-13 MED ORDER — METHYLPREDNISOLONE SODIUM SUCC 125 MG IJ SOLR: 125.0000 mg | Freq: Once | INTRAMUSCULAR | Status: DC | PRN

## 2020-03-13 MED ORDER — CLOPIDOGREL BISULFATE 75 MG PO TABS
ORAL_TABLET | ORAL | Status: AC
  Administered 2020-03-13: 300 mg via ORAL
  Filled 2020-03-13: qty 4

## 2020-03-13 MED ORDER — FAMOTIDINE 20 MG PO TABS: 40.0000 mg | ORAL_TABLET | Freq: Once | ORAL | Status: DC | PRN

## 2020-03-13 MED ORDER — HEPARIN SODIUM (PORCINE) 1000 UNIT/ML IJ SOLN
INTRAMUSCULAR | Status: AC
  Filled 2020-03-13: qty 1

## 2020-03-13 MED ORDER — LEVALBUTEROL HCL 0.63 MG/3ML IN NEBU
0.6300 mg | INHALATION_SOLUTION | Freq: Three times a day (TID) | RESPIRATORY_TRACT | Status: DC
  Administered 2020-03-13 – 2020-03-14 (×3): 0.63 mg via RESPIRATORY_TRACT
  Filled 2020-03-13 (×5): qty 3

## 2020-03-13 MED ORDER — SODIUM CHLORIDE 0.9 % IV SOLN: 250.0000 mL | INTRAVENOUS | Status: DC | PRN

## 2020-03-13 MED ORDER — HEPARIN SODIUM (PORCINE) 1000 UNIT/ML IJ SOLN
INTRAMUSCULAR | Status: DC | PRN
  Administered 2020-03-13: 5000 [IU] via INTRAVENOUS

## 2020-03-13 MED ORDER — ASPIRIN EC 81 MG PO TBEC
81.0000 mg | DELAYED_RELEASE_TABLET | Freq: Every day | ORAL | Status: DC
  Administered 2020-03-17: 81 mg via ORAL
  Filled 2020-03-13 (×3): qty 1

## 2020-03-13 MED ORDER — SODIUM CHLORIDE 0.9% FLUSH
3.0000 mL | INTRAVENOUS | Status: DC | PRN
  Administered 2020-03-16: 3 mL via INTRAVENOUS

## 2020-03-13 MED ORDER — SODIUM CHLORIDE 0.9 % IV SOLN: INTRAVENOUS | Status: DC

## 2020-03-13 MED ORDER — IPRATROPIUM BROMIDE 0.02 % IN SOLN
0.5000 mg | Freq: Three times a day (TID) | RESPIRATORY_TRACT | Status: DC
  Administered 2020-03-13 – 2020-03-14 (×3): 0.5 mg via RESPIRATORY_TRACT
  Filled 2020-03-13 (×3): qty 2.5

## 2020-03-13 MED ORDER — MIDAZOLAM HCL 2 MG/ML PO SYRP: 8.0000 mg | ORAL_SOLUTION | Freq: Once | ORAL | Status: DC | PRN

## 2020-03-13 MED ORDER — FENTANYL CITRATE (PF) 100 MCG/2ML IJ SOLN
INTRAMUSCULAR | Status: AC
  Filled 2020-03-13: qty 2

## 2020-03-13 MED ORDER — MIDAZOLAM HCL 5 MG/5ML IJ SOLN
INTRAMUSCULAR | Status: AC
  Filled 2020-03-13: qty 5

## 2020-03-13 MED ORDER — SODIUM CHLORIDE 0.9 % IV SOLN: INTRAVENOUS | Status: AC

## 2020-03-13 MED ORDER — ASPIRIN EC 81 MG PO TBEC
DELAYED_RELEASE_TABLET | ORAL | Status: AC
  Administered 2020-03-13: 81 mg via ORAL
  Filled 2020-03-13: qty 1

## 2020-03-13 SURGICAL SUPPLY — 20 items
BALLN LUTONIX  018 4X60X130 (BALLOONS) ×1
BALLN LUTONIX 018 4X60X130 (BALLOONS) ×1
BALLN ULTRASCORE 014 3X40X150 (BALLOONS) ×2
BALLOON LUTONIX 018 4X60X130 (BALLOONS) IMPLANT
BALLOON ULTRSCRE 014 3X40X150 (BALLOONS) IMPLANT
CATH ANGIO 5F PIGTAIL 65CM (CATHETERS) ×1 IMPLANT
CATH VERT 5FR 125CM (CATHETERS) ×1 IMPLANT
DEVICE PRESTO INFLATION (MISCELLANEOUS) ×1 IMPLANT
DEVICE STARCLOSE SE CLOSURE (Vascular Products) ×1 IMPLANT
GLIDEWIRE ADV .035X260CM (WIRE) ×1 IMPLANT
NDL ENTRY 21GA 7CM ECHOTIP (NEEDLE) IMPLANT
NEEDLE ENTRY 21GA 7CM ECHOTIP (NEEDLE) ×2 IMPLANT
PACK ANGIOGRAPHY (CUSTOM PROCEDURE TRAY) ×2 IMPLANT
SET INTRO CAPELLA COAXIAL (SET/KITS/TRAYS/PACK) ×1 IMPLANT
SHEATH BRITE TIP 5FRX11 (SHEATH) ×1 IMPLANT
SHEATH RAABE 6FR (SHEATH) ×1 IMPLANT
SYR MEDRAD MARK 7 150ML (SYRINGE) ×1 IMPLANT
TUBING CONTRAST HIGH PRESS 72 (TUBING) ×1 IMPLANT
WIRE J 3MM .035X145CM (WIRE) ×1 IMPLANT
WIRE RUNTHROUGH .014X300CM (WIRE) ×1 IMPLANT

## 2020-03-13 NOTE — Progress Notes (Signed)
PROGRESS NOTE    Kristy Mcmillan  TDD:[id number] DOB: [birth year] DOA: 03/09/2020 PCP: [facility]  Brief Narrative:  The patient is a 44 year old overweight Caucasian female with a past medical history significant for but not limited to is mellitus type II, history of seizure disorder who presented to the emergency room with acute onset of worsening right foot swelling associated with erythema, pain and tenderness.  She had a pedicure on 02/27/2020 before symptoms and began having a fever over the last couple days associated with chills.  She denies any nausea or vomiting.  She is not vaccinated against COVID-19 disease and upon presentation she is found to be febrile with a tachycardia and a respiratory rate of 29.  Labs revealed borderline potassium of 2.5 and she did have a hyponatremia and hypochloremia and elevated blood sugar with a hyperglycemia of 428.  In the ED sepsis protocol was started and she is given acetaminophen, IV cefepime, Flagyl and IV vancomycin as well as morphine sulfate IV Zofran.  Her potassium was repleted as well.  She was admitted for sepsis secondary to right foot extremity cellulitis with right fifth toe necrosis in the setting of an uncontrolled diabetic foot.    **Interim History She had bedside debridement done by podiatry when they were consulted. MRI was finally done. Vascular surgery evaluated and considering an angiogram early next week.  Continues to still be septic and had a temperature, tachycardia and tachypnea overnight which is now improved.  She also started having some loose stools likely antibiotic mediated.  03/11/20 MRI done yesterday showed "Exam significantly limited by patient motion and lack of contrast. 2. 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 3. Suspect large dorsal and smaller plantar forefoot abscesses as detailed above. 4.  Diffuse myofasciitis without obvious pyomyositis."   03/12/20 She was extremely confused today and would not answer questions appropriately.  And overnight her O2 requirements worsen and she needed 5 L.  She got put on oxygen after her surgical procedure and was not weaned off of it and now on 5 L.  Chest x-ray today showed multifocal pneumonia so will obtain a CT scan.  We will also give her IV Lasix, breathing treatments and because of her confusion and altered mental status will obtain a head CT scan without contrast, and ABG, TSH, RPR, vitamin B12 level.  Because they could not consent for the angiogram today given her confusion they are going to postpone it and attempted on Tuesday or Wednesday.  03/13/20 The patient is much more awake today and more oriented but still a little slower to respond.  She underwent an angiogram of her leg today by Dr. Delana Meyer and he felt as if he had a successful recanalization of the right lower extremity for limb salvage as she had a percutaneous transluminal angioplasty of the right tibial peroneal trunk and peroneal to 4 mm using a L[employer] drug-eluting balloon.  Her WBC is improved and she was weaned off of supplemental oxygen today and her repeat chest x-ray still pending.  She is at high risk for further amputation of the right lower extremity to limit her infection and podiatry is going to consider repeating an I&D to try to flush out the areas over time.  Assessment & Plan:   Active Problems:   Lower extremity cellulitis  Sepsis 2/2Right lower extremity/mainly foot severe cellulitis septic arthritis of the fifth MTP joint  with osteomyelitis including the fifth metatarsal head and fifth proximal phalanx along with multiple abscesses in the setting of uncontrolled type 2 diabetes mellitus and diabetic foot.  Status post incision and debridement postoperative day 2 Multifocal PNA -The patient has subsequent sepsis without severe sepsis or septic shock as  manifested by significant leuko-cytosis, fever, tachycardia and tachypnea; overnight she is febrile again with a temperature of 101.5, tachycardic with a pulse rate of 110, and respiratory rate 21 -She subsequently now ended up on O2 after her Surgical Procedure and ? Aspiration. Her O2 requirements have worsened over night and she has now been weaned off of supplemental oxygen -CXR yesterday at 03/12/2020 showed "Patchy bilateral airspace disease concerning for multifocal pneumonia. Small right pleural effusion. Low lung volumes." -DG of the foot showed "No fracture or malalignment. Vascular calcifications. Small plantar calcaneal spur. Joint spaces are maintained." -The patient will be admitted to a medically monitored bed. -We will continue antibiotic therapy with IV cefepime and vancomycin.  We have stopped IV Flagyl and changed from IV Zosyn to IV cefepime; will further de-escalate given low risk of Pseudomonas and tends to ceftriaxone and cover her for atypicals given her multifocal pneumonia and start azithromycin as well -Wound care consult to be obtained. -Podiatry consult has been obtained and they request vascular surgery evaluation as well; podiatry evaluated and she does have a full-thickness ulceration with abscess and early gangrenous changes to the right fifth toe as well as superficial abscess with underlying superficial ulceration of the right arch and so podiatry debrided the devitalized tissue from the ulceration of the fifth toe and debrided the devitalized tissue from the plantar ulceration sharply using the nippers and have ordered an MRI of the foot.  Dr. Cleda Mccreedy discussed with the patient that she is likely going to need some type of I&D for infection in her right foot is significantly at risk for losing her toe with a right fifth toe amputation -Vascular surgery has been consulted as well and I recommend following up on MRI and podiatry recommendations and await MRI results.  They are  considering an angiogram early next week -We'll check ABIs and showed the following:  "Right:  Resting ABI of the right lower extremity likely falsely elevated, potentially from noncompressible vessels.  Segmental Doppler at the right ankle demonstrates evidence of developing occlusive disease, though waveforms maintained.  Left:  Resting ABI within normal limits.  Segmental Doppler at the left ankle demonstrates evidence of development occlusive disease, with waveforms relatively maintained."   -Podiatry consulted and recommending further imaging with an MRI; -MRI done showed "Exam significantly limited by patient motion and lack of contrast. 2. 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 3. Suspect large dorsal and smaller plantar forefoot abscesses as detailed above. 4. Diffuse myofasciitis without obvious pyomyositis."  -Blood cultures x2 have been obtained and showed no growth to date at 4 Days -Given the likelihood of underlying tinea pedis that could be preceding bacterial superinfection, she was started n Lamisil cream twice daily. -IV fluid hydration will now be discontinued and she will be given a dose of IV Lasix given her worsening respiratory failure -Check CT scan of the chest and showed "Patchy airspace opacities are noted posteriorly in the upper and lower lobes bilaterally consistent with multifocal pneumonia. 2. Coronary artery calcifications are noted suggesting coronary artery disease. 3. Minimal pericardial effusion is noted." -Add breathing treatments -Continue to  follow blood cultures and temperature curve and repeat CBC in a.m. -Wound culture taken on 03/09/2020 and showed rare WBCs present and culture grew out few Staph aureus with susceptibilities to follow -Wound culture taken intraoperatively on 03/10/2020 showed few WBCs present predominantly PMNs and few  gram-positive cocci in pairs and clusters with culture pending -SpO2: 96 % O2 Flow Rate (L/min): 2 L/min; Now off of Supplemental O2 today -WBC on admission was 26.8 -> 24.0 -> 25.6 -> 28.4 -> 24.2 as further trended down to 19.5 -Podiatry discussed with the patient about needing amputation of the right fifth toe with ray resection as well as I&D of abscessed areas.  She was taken to the OR in the evening and had a right fifth toe amputation with partial ray resection and I&D of multiple abscesses at multiple sites in the right foot. -He was given Betadine gauze and this was applied to all incision areas followed by bulky gauze and a Kerlix bandage.  Podiatry is going to monitor continue daily.  But due to the amount of infection the patient is still at risk for amputation of the right lower extremity and podiatry recommends vascular surgery evaluation with angiogram for circulation status as well as higher amputation as needed -Continue supportive care and continue with antiemetics 4 mg p.o. every 6 as needed for nausea -Angiogram done today and was successful and she had two-vessel runoff and Dr. Delana Meyer it a percutaneous transluminal angioplasty of the right tibioperoneal trunk and peroneal to and feels like he had a successful recannulization of the right lower extremity for limb salvage -Dr. Cleda Mccreedy of podiatry continue recommends continued Betadine and sterile dressing to all incisions and he feels that there is still a significant chance that she may need further amputation to the right lower extremity to limit her infection but he was going to consider trying to repeat an I&D to try to flush these areas out 1 more time  Confusion and Encephalopathy, improving but she is still slower to respond to -Acute -In the setting of possible multifocal pneumonia and worsening respiratory status -WBC still remains elevated-chest x-ray showed concern for multifocal pneumonia -We will check CT of the head and  CT scan of the chest -CT of the chest is pending but CTs scan of the head was negative; CT of the chest -Check ABG, RPR, TSH and ammonia level as well -ABG done and showed a pH of 7.43, PCO2 of 28, PO2 of 59, ABG O2 saturation of 90.8% with a bicarbonate level of 18.6 -Give a dose of IV Lasix 40 mg x 1 yesterday -Check SLP evaluation and recommending regular diet with thin liquids -De-escalate IV cefepime to IV ceftriaxone and azithromycin given atypicals -Likely this is infectious and if she is still not improving will consider MRI and consultation to neurology -Continue to monitor very closely and place on delirium precautions -Mentation is much more improved today which is still slightly slurred respond  Uncontrolled Hyperglycemia in the setting of Type 2 Diabetes Mellitus -Patient hemoglobin A1c was 9.7 -The patient will be placed on resistant subcutaneous NovoLog with frequent fingerstick blood glucose measures in addition to hydration with IV normal saline however she was changed to sensitive NovoLog sliding scale before meals and at bedtime given her hypoglycemic episode -We will continue basal coverage Levemir 10 units subcu and p.o. Tradjenta. -CBGs ranging from 119-212  Essential Hypertension. -Continue with Lisinopril 2.5 mg p.o. daily  Hyperlipidemia -Continue with Atorvastatin 40 g p.o. nightly  GERD. -PPI  therapy will be resumed. May need to be changed to IV  Hyponatremia/Hypochloremia -Mild patient's sodium is now 133 today -Patient's chloride level has gone from 93 -> 96 -> 100 -> 102 -IV fluid hydration is now stopped -Continue monitor and trend and repeat CMP in a.m.  Loose Stools -In the setting of Antibiotics, and now improving -WBC is elevated but likely from Severe Cellulitis -Continue to Monitor and if worsens consider checking C Difficile and GI Pathogen Panel -Hold all Laxatives and if necessary Hold PPI  Hypokalemia -Patient's potassium this  morning was 4.1 -Check Magnesium Level and was 1.8 -Continue to Monitor and Replete as Necessary -Repeat CMP in the AM  Thrombocytosis -Patient's platelet count on admission was 570 and went to 532 -> 454 -> 530 -> 567 -> 637 -Likely is Reactive in the setting of Infection and question if this is also antibiotic mated -Continue monitor and trend and Repeat CBC in a.m.  Mild AKI Metabolic acidosis, mild -Patient's BUNs/creatinine on admission was 18/1.17 and improving and is now 19/0.93 -Patient's CO2 is now 22, anion gap is 10, chloride level is 101 -Baseline appears to be from 0.6-0.8 -Continue with IV fluid hydration as above and reduce rate from 100 mL/hr to 75 mL/hr -Avoid further nephrotoxic medications, contrast dyes, and hypotension and renally adjust medications  -Repeat CBC in a.m.  Normocytic Anemia -Patient's hemoglobin/hematocrit admission was 10.5/30.8 and i dropped to 7.6/22.9 likely in the setting of a dilutional drop from IV fluids; today her hemoglobin/hematocrit is now 8.3/25.0 -Stopped IV fluids -Check anemia panel in the morning -Continue to monitor for signs and symptoms of bleeding; currently no overt bleeding noted -Repeat CBC in the AM   DiabeticPeripheral Neuropathy. -Continue with Duloxetine 30 mg q. twice daily  History of Seizures  -Continue with Zonisamide 100 g p.o. daily  -Has had childhood febrile seizure and questionable eclampsia  Hypomagnesemia -Patient's magnesium level is now 1.8 -Continue to monitor and replete as necessary -Repeat Magnesium Level in the AM  DVT prophylaxis: Enoxaparin 40 mg sq q24h Code Status: FULL CODE  Family Communication: No family currently at bedside Disposition Plan: Pending further evaluation by Orthopedic Surgery and Vascular Surgery: She is now altered and will need to be at baseline prior to safe discharge  Status is: Inpatient  Remains inpatient appropriate because:Altered mental status, Ongoing  diagnostic testing needed not appropriate for outpatient work up, IV treatments appropriate due to intensity of illness or inability to take PO and Inpatient level of care appropriate due to severity of illness   Dispo: The patient is from: Home              Anticipated d/c is to: TBD              Anticipated d/c date is: 2 days minimum              Patient currently is not medically stable to d/c.  Consultants:   Podiatry  Vascular Surgery   Procedures:  ABI's IMPRESSION: Right:  Resting ABI of the right lower extremity likely falsely elevated, potentially from noncompressible vessels.  Segmental Doppler at the right ankle demonstrates evidence of developing occlusive disease, though waveforms maintained.  Left:  Resting ABI within normal limits.  Segmental Doppler at the left ankle demonstrates evidence of development occlusive disease, with waveforms relatively maintained.  Antimicrobials:  Anti-infectives (From admission, onward)   Start     Dose/Rate Route Frequency Ordered Stop   03/13/20 0200  vancomycin (VANCOREADY)  IVPB 1500 mg/300 mL        1,500 mg 150 mL/hr over 120 Minutes Intravenous Every 24 hours 03/12/20 0915     03/12/20 2200  cefTRIAXone (ROCEPHIN) 2 g in sodium chloride 0.9 % 100 mL IVPB        2 g 200 mL/hr over 30 Minutes Intravenous Every 24 hours 03/12/20 1535     03/12/20 1800  azithromycin (ZITHROMAX) 500 mg in sodium chloride 0.9 % 250 mL IVPB        500 mg 250 mL/hr over 60 Minutes Intravenous Every 24 hours 03/12/20 1545     03/12/20 0440  vancomycin variable dose per unstable renal function (pharmacist dosing)  Status:  Discontinued         Does not apply See admin instructions 03/12/20 0440 03/12/20 0915   03/10/20 2018  vancomycin (VANCOCIN) powder  Status:  Discontinued          As needed 03/10/20 2018 03/10/20 2042   03/09/20 1400  vancomycin (VANCOCIN) IVPB 1000 mg/200 mL premix  Status:  Discontinued        1,000 mg 200 mL/hr  over 60 Minutes Intravenous Every 12 hours 03/09/20 0301 03/12/20 0440   03/09/20 0900  ceFEPIme (MAXIPIME) 2 g in sodium chloride 0.9 % 100 mL IVPB  Status:  Discontinued        2 g 200 mL/hr over 30 Minutes Intravenous Every 8 hours 03/09/20 0144 03/12/20 1535   03/09/20 0200  vancomycin (VANCOREADY) IVPB 500 mg/100 mL        500 mg 100 mL/hr over 60 Minutes Intravenous  Once 03/09/20 0149 03/09/20 0437   03/09/20 0145  vancomycin (VANCOCIN) IVPB 1000 mg/200 mL premix  Status:  Discontinued        1,000 mg 200 mL/hr over 60 Minutes Intravenous  Once 03/09/20 0144 03/09/20 0149   03/09/20 0030  ceFEPIme (MAXIPIME) 2 g in sodium chloride 0.9 % 100 mL IVPB        2 g 200 mL/hr over 30 Minutes Intravenous  Once 03/09/20 0019 03/09/20 0133   03/09/20 0030  metroNIDAZOLE (FLAGYL) IVPB 500 mg        500 mg 100 mL/hr over 60 Minutes Intravenous  Once 03/09/20 0019 03/09/20 0214   03/09/20 0030  vancomycin (VANCOCIN) IVPB 1000 mg/200 mL premix        1,000 mg 200 mL/hr over 60 Minutes Intravenous  Once 03/09/20 0019 03/09/20 0332        Subjective: Seen and examined at bedside and she is much more alert today but still slower to respond.  She answers questions appropriately and states that her sister has been bugging her.  She also states that she thinks that the insulin and antibiotics is causing her diarrhea.  No nausea or vomiting but complains of pain in her foot.  Denies any other concerns or complaints at this time and states that she ate mashed potatoes for lunch.  Has been weaned off of supplemental oxygen via nasal cannula.   Objective: Vitals:   03/13/20 1145 03/13/20 1200 03/13/20 1320 03/13/20 1424  BP:  (!) 142/97 117/69   Pulse: 90 100  98  Resp: (!) 22 20 18 18   Temp:      TempSrc:      SpO2: 98% 93% 94% 92%  Weight:      Height:        Intake/Output Summary (Last 24 hours) at 03/13/2020 1539 Last data filed at 03/13/2020 0548 Gross per  24 hour  Intake 492.94 ml   Output 402 ml  Net 90.94 ml   Filed Weights   03/08/20 1911 03/13/20 0935  Weight: 75.8 kg 75.8 kg   Examination: Physical Exam:  Constitutional: WN/WD overweight Caucasian female in NAD and appears calm and comfortable Eyes: Lids and conjunctivae normal, sclerae anicteric  ENMT: External Ears, Nose appear normal. Grossly normal hearing.  Neck: Appears normal, supple, no cervical masses, normal ROM, no appreciable thyromegaly; no JVD Respiratory: Diminished to auscultation bilaterally with coarse breath sounds, no wheezing, rales, rhonchi or crackles. Normal respiratory effort and patient is not tachypenic. No accessory muscle use. Unlabored breathing and not wearing Supplemental O2 via [place]  Cardiovascular: RRR, no murmurs / rubs / gallops. S1 and S2 auscultated. No extremity edema. 2+ pedal pulses. No carotid bruits.  Abdomen: Soft, non-tender, Distended 2/2 to body habitus. No masses palpated. No appreciable hepatosplenomegaly. Bowel sounds positive.  GU: Deferred. Musculoskeletal: No clubbing / cyanosis of digits/nails. Right Foot is wrapped Skin: No rashes, lesions, ulcers on a limited skin evaluation. No induration; Warm and dry.  Neurologic: CN 2-12 grossly intact with no focal deficits.  Romberg sign cerebellar reflexes not assessed.  Psychiatric: Slightly impaired judgment and insight. Alert and oriented x 2. Normal mood and appropriate affect.   Data Reviewed: I have personally reviewed following labs and imaging studies  CBC: Recent Labs  Lab 03/08/20 1925 03/08/20 1925 03/09/20 0514 03/10/20 0458 03/11/20 0911 03/12/20 0117 03/13/20 0656  WBC 26.8*   < > 24.0* 25.6* 28.4* 24.2* 19.5*  NEUTROABS 22.9*  --   --  20.5* 23.8* 18.9* 13.6*  HGB 10.5*   < > 8.7* 8.3* 8.2* 7.6* 8.3*  HCT 30.8*   < > 25.4* 24.3* 24.9* 22.9* 25.0*  MCV 81.9   < > 82.2 81.8 84.7 83.6 85.0  PLT 578*   < > 432* 454* 530* 567* 637*   < > = values in this interval not displayed.   Basic  Metabolic Panel: Recent Labs  Lab 03/10/20 0458 03/11/20 0911 03/12/20 0117 03/12/20 0649 03/13/20 0656  NA 132* 133* 133* 132* 133*  K 3.6 4.4 4.3 4.0 4.1  CL 100 102 103 102 101  CO2 20* 20* 22 19* 22  GLUCOSE 188* 145* 106* 112* 147*  BUN 14 17 20 19 18   CREATININE 0.75 0.96 0.99 0.93 1.01*  CALCIUM 8.0* 8.0* 8.3* 8.0* 8.1*  MG 1.5* 1.9 1.8  --  1.8  PHOS 3.0 3.7 3.7  --  2.8   GFR: Estimated Creatinine Clearance: 70.8 mL/min (A) (by C-G formula based on SCr of 1.01 mg/dL (H)). Liver Function Tests: Recent Labs  Lab 03/08/20 1925 03/10/20 0458 03/11/20 0911 03/12/20 0117 03/13/20 0656  AST 11* 12* 12* 11* 12*  ALT 10 9 8 9 8   ALKPHOS 122 113 131* 161* 139*  BILITOT 0.7 0.9 0.7 0.7 0.4  PROT 8.4* 6.3* 7.0 6.9 7.2  ALBUMIN 2.4* 1.8* 1.8* 1.6* 1.7*   No results for input(s): LIPASE, AMYLASE in the last 168 hours. Recent Labs  Lab 03/13/20 0656  AMMONIA 16   Coagulation Profile: No results for input(s): INR, PROTIME in the last 168 hours. Cardiac Enzymes: No results for input(s): CKTOTAL, CKMB, CKMBINDEX, TROPONINI in the last 168 hours. BNP (last 3 results) No results for input(s): PROBNP in the last 8760 hours. HbA1C: No results for input(s): HGBA1C in the last 72 hours. CBG: Recent Labs  Lab 03/12/20 0723 03/12/20 1224 03/12/20 1638 03/12/20 2052 03/13/20  0800  GLUCAP 104* 81 212* 209* 119*   Lipid Profile: No results for input(s): CHOL, HDL, LDLCALC, TRIG, CHOLHDL, LDLDIRECT in the last 72 hours. Thyroid Function Tests: Recent Labs    03/12/20 1107  TSH 0.532   Anemia Panel: Recent Labs    03/12/20 0117  VITAMINB12 439   Sepsis Labs: Recent Labs  Lab 03/08/20 1925 03/13/20 0656  PROCALCITON  --  0.37  LATICACIDVEN 1.3  --     Recent Results (from the past 240 hour(s))  Blood culture (routine x 2)     Status: None   Collection Time: 03/08/20  7:26 PM   Specimen: BLOOD  Result Value Ref Range Status   Specimen Description BLOOD  RIGHT ANTECUBITAL  Final   Special Requests   Final    BOTTLES DRAWN AEROBIC AND ANAEROBIC Blood Culture results may not be optimal due to an excessive volume of blood received in culture bottles   Culture   Final    NO GROWTH 5 DAYS Performed at [facility], [address] [place], [place] [address]    Report Status 03/13/2020 FINAL  Final  Blood culture (routine x 2)     Status: None (Preliminary result)   Collection Time: 03/09/20 12:55 AM   Specimen: BLOOD  Result Value Ref Range Status   Specimen Description BLOOD LEFT [facility]  Final   Special Requests   Final    BOTTLES DRAWN AEROBIC AND ANAEROBIC Blood Culture adequate volume   Culture   Final    NO GROWTH 4 DAYS Performed at [facility], [address], [place], [place] [address]    Report Status PENDING  Incomplete  SARS Coronavirus 2 by RT PCR (hospital order, performed in [place] hospital lab) Nasopharyngeal Nasopharyngeal Swab     Status: None   Collection Time: 03/09/20 12:55 AM   Specimen: Nasopharyngeal Swab  Result Value Ref Range Status   SARS Coronavirus 2 NEGATIVE NEGATIVE Final    Comment: (NOTE) SARS-CoV-2 target nucleic acids are NOT DETECTED.  The SARS-CoV-2 RNA is generally detectable in upper and lower respiratory specimens during the acute phase of infection. The lowest concentration of SARS-CoV-2 viral copies this assay can detect is 250 copies / mL. A negative result does not preclude SARS-CoV-2 infection and should not be used as the sole basis for treatment or other patient management decisions.  A negative result may occur with improper specimen collection / handling, submission of specimen other than nasopharyngeal swab, presence of viral mutation(s) within the areas targeted by this assay, and inadequate number of viral copies (<250 copies / mL). A negative result must be combined with clinical observations, patient history, and epidemiological information.  Fact Sheet for  Patients:   StrictlyIdeas.no  Fact Sheet for Healthcare Providers: BankingDealers.co.za  This test is not yet approved or  cleared by the [place] FDA and has been authorized for detection and/or diagnosis of SARS-CoV-2 by FDA under an Emergency Use Authorization (EUA).  This EUA will remain in effect (meaning this test can be used) for the duration of the COVID-19 declaration under Section 564(b)(1) of the Act, 21 U.S.C. section 360bbb-3(b)(1), unless the authorization is terminated or revoked sooner.  Performed at [facility], [address] [place], [place] [address]   Aerobic/Anaerobic Culture (surgical/deep wound)     Status: None (Preliminary result)   Collection Time: 03/09/20  5:28 PM   Specimen: Wound; Abscess  Result Value Ref Range Status   Specimen Description WOUND  RIGHT FOOT  Final   Special Requests NONE  Final   Gram Stain   Final    RARE WBC PRESENT, PREDOMINANTLY PMN NO ORGANISMS SEEN Performed at [facility], [place] [address], [place], [place] [address]    Culture   Final    FEW METHICILLIN RESISTANT STAPHYLOCOCCUS AUREUS NO ANAEROBES ISOLATED; CULTURE IN PROGRESS FOR 5 DAYS    Report Status PENDING  Incomplete   Organism ID, Bacteria METHICILLIN RESISTANT STAPHYLOCOCCUS AUREUS  Final      Susceptibility   Methicillin resistant staphylococcus aureus - MIC*    CIPROFLOXACIN >=8 RESISTANT Resistant     ERYTHROMYCIN >=8 RESISTANT Resistant     GENTAMICIN <=0.5 SENSITIVE Sensitive     OXACILLIN >=4 RESISTANT Resistant     TETRACYCLINE <=1 SENSITIVE Sensitive     VANCOMYCIN <=0.5 SENSITIVE Sensitive     TRIMETH/SULFA 80 RESISTANT Resistant     CLINDAMYCIN <=0.25 SENSITIVE Sensitive     RIFAMPIN <=0.5 SENSITIVE Sensitive     Inducible Clindamycin NEGATIVE Sensitive     * FEW METHICILLIN RESISTANT STAPHYLOCOCCUS AUREUS  Aerobic/Anaerobic Culture (surgical/deep wound)     Status: None  (Preliminary result)   Collection Time: 03/10/20  8:03 PM   Specimen: PATH Other; Wound  Result Value Ref Range Status   Specimen Description   Final    ABSCESS Performed at [facility], [address] [place], [place] [address]    Special Requests DEEP  Final   Gram Stain   Final    FEW WBC PRESENT, PREDOMINANTLY PMN FEW GRAM POSITIVE COCCI IN PAIRS IN CLUSTERS Performed at [facility], [place] [address] [place],  [address]    Culture   Final    ABUNDANT STAPHYLOCOCCUS AUREUS NO ANAEROBES ISOLATED; CULTURE IN PROGRESS FOR 5 DAYS    Report Status PENDING  Incomplete     RN Pressure Injury Documentation:     Estimated body mass index is 28.68 kg/m as calculated from the following:   Height as of this encounter: 5\' 4"  (1.626 m).   Weight as of this encounter: 75.8 kg.  Malnutrition Type:      Malnutrition Characteristics:      Nutrition Interventions:    Radiology Studies: DG Chest 1 View  Result Date: 03/12/2020 CLINICAL DATA:  Increased shortness of breath. EXAM: CHEST  1 VIEW COMPARISON:  One-view chest x-ray is 07/31/2019 FINDINGS: Heart size is exaggerated by low lung volumes. Patchy bilateral airspace opacities are present. Small effusion is suspected on right. No pneumothorax is present. The visualized soft tissues and bony thorax are unremarkable. IMPRESSION: 1. Patchy bilateral airspace disease concerning for multifocal pneumonia. 2. Small right pleural effusion. 3. Low lung volumes. Electronically Signed   By: San Morelle M.D.   On: 03/12/2020 08:36   CT HEAD WO CONTRAST  Result Date: 03/12/2020 CLINICAL DATA:  Delirium, new from baseline. Recent amputation of toe. EXAM: CT HEAD WITHOUT CONTRAST TECHNIQUE: Contiguous axial images were obtained from the base of the skull through the vertex without intravenous contrast. COMPARISON:  CT head without contrast 02/19/2020 FINDINGS: Brain: No acute infarct, hemorrhage, or mass lesion  is present. No significant white matter lesions are present. The ventricles are of normal size. No significant extraaxial fluid collection is present. The brainstem and cerebellum are within normal limits. Vascular: No hyperdense vessel or unexpected calcification. Skull: Calvarium is intact. No focal lytic or blastic lesions are present. No significant extracranial soft tissue lesion is present. Sinuses/Orbits: The paranasal  sinuses and mastoid air cells are clear. The globes and orbits are within normal limits. IMPRESSION: Negative CT of the head. Electronically Signed   By: San Morelle M.D.   On: 03/12/2020 11:41   CT CHEST WO CONTRAST  Result Date: 03/12/2020 CLINICAL DATA:  Respiratory failure. EXAM: CT CHEST WITHOUT CONTRAST TECHNIQUE: Multidetector CT imaging of the chest was performed following the standard protocol without IV contrast. COMPARISON:  None. FINDINGS: Cardiovascular: No evidence of thoracic aortic aneurysm is noted. Minimal pericardial effusion is noted. Coronary artery calcifications are noted. Normal cardiac size. Mediastinum/Nodes: No enlarged mediastinal or axillary lymph nodes. Thyroid gland, trachea, and esophagus demonstrate no significant findings. Lungs/Pleura: No pneumothorax is noted. No pleural effusion is noted. Patchy airspace opacities are noted posteriorly in the upper and lower lobes bilaterally consistent with multifocal pneumonia. Upper Abdomen: No acute abnormality. Musculoskeletal: No chest wall mass or suspicious bone lesions identified. IMPRESSION: 1. Patchy airspace opacities are noted posteriorly in the upper and lower lobes bilaterally consistent with multifocal pneumonia. 2. Coronary artery calcifications are noted suggesting coronary artery disease. 3. Minimal pericardial effusion is noted. Electronically Signed   By: Marijo Conception M.D.   On: 03/12/2020 17:52   PERIPHERAL VASCULAR CATHETERIZATION  Result Date: 03/13/2020 See op note  Scheduled  Meds: . aspirin EC  81 mg Oral Daily  . atorvastatin  40 mg Oral QHS  . calcium-vitamin D  1 tablet Oral Q breakfast  . cholecalciferol  1,000 Units Oral Daily  . [START ON 03/14/2020] clopidogrel  75 mg Oral Q breakfast  . DULoxetine  30 mg Oral BID  . enoxaparin (LOVENOX) injection  40 mg Subcutaneous Q24H  . fentaNYL      . heparin sodium (porcine)      . insulin aspart  0-5 Units Subcutaneous QHS  . insulin aspart  0-9 Units Subcutaneous TID WC  . insulin detemir  10 Units Subcutaneous QHS  . ipratropium  0.5 mg Nebulization TID  . levalbuterol  0.63 mg Nebulization TID  . linagliptin  5 mg Oral Daily  . lisinopril  2.5 mg Oral Daily  . midazolam      . sodium chloride flush  3 mL Intravenous Q12H  . terbinafine   Topical BID  . vitamin B-12  500 mcg Oral Daily  . zonisamide  100 mg Oral Daily   Continuous Infusions: . sodium chloride    . azithromycin 500 mg (03/12/20 2237)  . cefTRIAXone (ROCEPHIN)  IV 2 g (03/12/20 2300)  . vancomycin 1,500 mg (03/13/20 0330)    LOS: 4 days   Kerney Elbe, DO [facility] PAGER is on [place]  If 7PM-7AM, please contact night-coverage [email or web address]

## 2020-03-13 NOTE — Interval H&P Note (Signed)
History and Physical Interval Note:  03/13/2020 9:27 AM  Kristy Mcmillan  has presented today for surgery, with the diagnosis of Gangrene right foot.  The various methods of treatment have been discussed with the patient and family. After consideration of risks, benefits and other options for treatment, the patient has consented to  Procedure(s): [place] (Right) as a surgical intervention.  The patient's history has been reviewed, patient examined, no change in status, stable for surgery.  I have reviewed the patient's chart and labs.  Questions were answered to the patient's satisfaction.     Hortencia Pilar

## 2020-03-13 NOTE — Op Note (Signed)
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  Post-operative diagnosis: Same  Procedure(s) Performed: 1. Introduction catheter into right lower extremity 3rd order catheter placement  2. Contrast injection right lower extremity for distal runoff with additional 3rd order  3. Percutaneous transluminal angioplasty of the right tibioperoneal trunk and peroneal 2 4 mm using a Lutonix drug-eluting balloon              4. Star close closure left common femoral arteriotomy  Anesthesia: Conscious sedation was administered under my direct supervision by the interventional radiology RN. IV Versed plus fentanyl were utilized. Continuous ECG, pulse oximetry and blood pressure was monitored throughout the entire procedure.  Conscious sedation was for a total of 38 minutes.  Sheath: 6 [place] Raby left common femoral retrograde  Contrast: 45 cc  Fluoroscopy Time: 4.6 minutes  Indications: Kristy Mcmillan presents with increasing pain of the right lower extremity.  She has an extensive abscess of the right foot and is undergone incision and drainage.  At the present time she continues to have a large amount of purulent material and is scheduled for repeat debridement.  This suggests the patient is having limb threatening ischemia. The risks and benefits are reviewed all questions answered patient agrees to proceed.  Procedure:Kristy Mcmillan is a 44 y.o. y.o. female who was identified and appropriate procedural time out was performed. The patient was then placed supine on the table and prepped and draped in the usual sterile fashion.   Ultrasound was placed in the sterile sleeve and the left groin was evaluated the left common  femoral artery was echolucent and pulsatile indicating patency. Image was recorded for the permanent record and under real-time visualization a microneedle was inserted into the common femoral artery followed by the microwire and then the micro-sheath. A J-wire was then advanced through the micro-sheath and a 5 [place] sheath was then inserted over a J-wire. J-wire was then advanced and a 5 French pigtail catheter was positioned at the level of T12.  AP projection of the aorta was then obtained. Pigtail catheter was repositioned to above the bifurcation and a LAO view of the pelvis was obtained. Subsequently a pigtail catheter with the stiff angle Glidewire was used to cross the aortic bifurcation the catheter wire were advanced down into the right distal external iliac artery. Oblique view of the femoral bifurcation was then obtained and subsequently the wire was reintroduced and the pigtail catheter negotiated into the SFA representing third order catheter placement. Distal runoff was then performed.  Diagnostic interpretation: The abdominal aorta is opacified with a bolus injection contrast.  It is smooth in contour and free of any atherosclerotic occlusive changes.  There are no hemodynamically significant stenoses.  There are single renal arteries bilaterally with no evidence of renal artery stenosis.  The aortic bifurcation is widely patent.  The common external and internal iliac arteries are widely patent bilaterally.  The right common femoral profunda femoris superficial femoral and popliteal arteries are widely patent.  Anterior tibial is patent down to the foot and fills the pedal arch.  The tibioperoneal trunk and the proximal peroneal demonstrated greater than 70% stenosis at tandem locations.  Posterior tibial occludes a few millimeters distal to its origin remains occluded throughout its entire course.  The peroneal contributes a large distal collateral filling the plantar vessels.  5000  units of heparin was then given and allowed to circulate and a 6 [place] Raby sheath was advanced up and over the bifurcation and positioned in the femoral artery  Straight catheter and stiff angle Glidewire were then negotiated down into the distal popliteal. Catheter was then advanced. Hand injection contrast demonstrated the tibial anatomy in detail.  A 3 mm x 40 mm ultra score balloon was used to angioplasty the tibioperoneal trunk and proximal peroneal.  The inflation was for 1 minutes at 12 atm.  Next a 4 mm x 60 mm Lutonix drug-eluting balloon was advanced across these lesions and inflated to 8 atm for 1 full minute.  Follow-up imaging demonstrated excellent patency with less than 5% residual stenosis and preservation of the distal runoff.  After review of these images the sheath is pulled into the left external iliac oblique of the common femoral is obtained and a Star close device deployed. There no immediate Complications.  Findings:  The abdominal aorta is opacified with a bolus injection contrast.  It is smooth in contour and free of any atherosclerotic occlusive changes.  There are no hemodynamically significant stenoses.  There are single renal arteries bilaterally with no evidence of renal artery stenosis.  The aortic bifurcation is widely patent.  The common external and internal iliac arteries are widely patent bilaterally.  The right common femoral profunda femoris superficial femoral and popliteal arteries are widely patent.  Anterior tibial is patent down to the foot and fills the pedal arch.  The tibioperoneal trunk and the proximal peroneal demonstrated greater than 70% stenosis at tandem locations.  Posterior tibial occludes a few millimeters distal to its origin remains occluded throughout its entire course.  The peroneal contributes a large distal collateral filling the plantar vessels.  Following angioplasty the peroneal and tibioperoneal trunk now is now widely patent and  demonstrates in-line flow and less than 5% residual stenosis.  Anterior tibial artery runoff is preserved.  Summary: Successful recanalization right lower extremity for limb salvage   Disposition: Patient was taken to the recovery room in stable condition having tolerated the procedure well.  Kristy Mcmillan 03/13/2020,10:53 AM

## 2020-03-13 NOTE — Progress Notes (Signed)
3 Days Post-Op   Subjective/Chief Complaint: Patient seen.  Still complains of some significant pain in her right foot.   Objective: Vital signs in last 24 hours: Temp:  [97.4 F (36.3 C)-98.1 F (36.7 C)] 98 F (36.7 C) (09/14 0759) Pulse Rate:  [82-105] 95 (09/14 0801) Resp:  [16-20] 16 (09/14 0801) BP: (97-120)/(63-73) 115/73 (09/14 0759) SpO2:  [90 %-98 %] 90 % (09/14 0801) Last BM Date: 03/13/20  Intake/Output from previous day: 09/13 0701 - 09/14 0700 In: 492.9 [IV Piggyback:492.9] Out: 402 [Urine:400; Stool:2] Intake/Output this shift: No intake/output data recorded.  Moderate to heavy drainage with some significant purulence noted on the bandaging today.  Some continued cellulitis and drainage with early dehiscence of the plantar incision with continued necrosis dorsally with significant expressible purulence.        Lab Results:  Recent Labs    03/12/20 0117 03/13/20 0656  WBC 24.2* 19.5*  HGB 7.6* 8.3*  HCT 22.9* 25.0*  PLT 567* 637*   BMET Recent Labs    03/12/20 0649 03/13/20 0656  NA 132* 133*  K 4.0 4.1  CL 102 101  CO2 19* 22  GLUCOSE 112* 147*  BUN 19 18  CREATININE 0.93 1.01*  CALCIUM 8.0* 8.1*   PT/INR No results for input(s): LABPROT, INR in the last 72 hours. ABG Recent Labs    03/12/20 1030  PHART 7.43  HCO3 18.6*    Studies/Results: DG Chest 1 View  Result Date: 03/12/2020 CLINICAL DATA:  Increased shortness of breath. EXAM: CHEST  1 VIEW COMPARISON:  One-view chest x-ray is 07/31/2019 FINDINGS: Heart size is exaggerated by low lung volumes. Patchy bilateral airspace opacities are present. Small effusion is suspected on right. No pneumothorax is present. The visualized soft tissues and bony thorax are unremarkable. IMPRESSION: 1. Patchy bilateral airspace disease concerning for multifocal pneumonia. 2. Small right pleural effusion. 3. Low lung volumes. Electronically Signed   By: San Morelle M.D.   On: 03/12/2020  08:36   CT HEAD WO CONTRAST  Result Date: 03/12/2020 CLINICAL DATA:  Delirium, new from baseline. Recent amputation of toe. EXAM: CT HEAD WITHOUT CONTRAST TECHNIQUE: Contiguous axial images were obtained from the base of the skull through the vertex without intravenous contrast. COMPARISON:  CT head without contrast 02/19/2020 FINDINGS: Brain: No acute infarct, hemorrhage, or mass lesion is present. No significant white matter lesions are present. The ventricles are of normal size. No significant extraaxial fluid collection is present. The brainstem and cerebellum are within normal limits. Vascular: No hyperdense vessel or unexpected calcification. Skull: Calvarium is intact. No focal lytic or blastic lesions are present. No significant extracranial soft tissue lesion is present. Sinuses/Orbits: The paranasal sinuses and mastoid air cells are clear. The globes and orbits are within normal limits. IMPRESSION: Negative CT of the head. Electronically Signed   By: San Morelle M.D.   On: 03/12/2020 11:41   CT CHEST WO CONTRAST  Result Date: 03/12/2020 CLINICAL DATA:  Respiratory failure. EXAM: CT CHEST WITHOUT CONTRAST TECHNIQUE: Multidetector CT imaging of the chest was performed following the standard protocol without IV contrast. COMPARISON:  None. FINDINGS: Cardiovascular: No evidence of thoracic aortic aneurysm is noted. Minimal pericardial effusion is noted. Coronary artery calcifications are noted. Normal cardiac size. Mediastinum/Nodes: No enlarged mediastinal or axillary lymph nodes. Thyroid gland, trachea, and esophagus demonstrate no significant findings. Lungs/Pleura: No pneumothorax is noted. No pleural effusion is noted. Patchy airspace opacities are noted posteriorly in the upper and lower lobes bilaterally consistent with  multifocal pneumonia. Upper Abdomen: No acute abnormality. Musculoskeletal: No chest wall mass or suspicious bone lesions identified. IMPRESSION: 1. Patchy airspace  opacities are noted posteriorly in the upper and lower lobes bilaterally consistent with multifocal pneumonia. 2. Coronary artery calcifications are noted suggesting coronary artery disease. 3. Minimal pericardial effusion is noted. Electronically Signed   By: Marijo Conception M.D.   On: 03/12/2020 17:52    Anti-infectives: Anti-infectives (From admission, onward)   Start     Dose/Rate Route Frequency Ordered Stop   03/13/20 0200  vancomycin (VANCOREADY) IVPB 1500 mg/300 mL        1,500 mg 150 mL/hr over 120 Minutes Intravenous Every 24 hours 03/12/20 0915     03/12/20 2200  cefTRIAXone (ROCEPHIN) 2 g in sodium chloride 0.9 % 100 mL IVPB        2 g 200 mL/hr over 30 Minutes Intravenous Every 24 hours 03/12/20 1535     03/12/20 1800  azithromycin (ZITHROMAX) 500 mg in sodium chloride 0.9 % 250 mL IVPB        500 mg 250 mL/hr over 60 Minutes Intravenous Every 24 hours 03/12/20 1545     03/12/20 0440  vancomycin variable dose per unstable renal function (pharmacist dosing)  Status:  Discontinued         Does not apply See admin instructions 03/12/20 0440 03/12/20 0915   03/10/20 2018  vancomycin (VANCOCIN) powder  Status:  Discontinued          As needed 03/10/20 2018 03/10/20 2042   03/09/20 1400  vancomycin (VANCOCIN) IVPB 1000 mg/200 mL premix  Status:  Discontinued        1,000 mg 200 mL/hr over 60 Minutes Intravenous Every 12 hours 03/09/20 0301 03/12/20 0440   03/09/20 0900  ceFEPIme (MAXIPIME) 2 g in sodium chloride 0.9 % 100 mL IVPB  Status:  Discontinued        2 g 200 mL/hr over 30 Minutes Intravenous Every 8 hours 03/09/20 0144 03/12/20 1535   03/09/20 0200  vancomycin (VANCOREADY) IVPB 500 mg/100 mL        500 mg 100 mL/hr over 60 Minutes Intravenous  Once 03/09/20 0149 03/09/20 0437   03/09/20 0145  vancomycin (VANCOCIN) IVPB 1000 mg/200 mL premix  Status:  Discontinued        1,000 mg 200 mL/hr over 60 Minutes Intravenous  Once 03/09/20 0144 03/09/20 0149   03/09/20 0030   ceFEPIme (MAXIPIME) 2 g in sodium chloride 0.9 % 100 mL IVPB        2 g 200 mL/hr over 30 Minutes Intravenous  Once 03/09/20 0019 03/09/20 0133   03/09/20 0030  metroNIDAZOLE (FLAGYL) IVPB 500 mg        500 mg 100 mL/hr over 60 Minutes Intravenous  Once 03/09/20 0019 03/09/20 0214   03/09/20 0030  vancomycin (VANCOCIN) IVPB 1000 mg/200 mL premix        1,000 mg 200 mL/hr over 60 Minutes Intravenous  Once 03/09/20 0019 03/09/20 0332      Assessment/Plan: s/p Procedure(s): AMPUTATION FIFTH RAY WITH IRRIGATION AND DEBRIDEMENT RIGHT FOOT (Right) Assessment: Continued abscess and infection status post I&D.   Plan: Betadine and sterile dressings reapplied to all of the incision areas.  Patient is undergoing angiogram today.  Discussed with the patient and her husband by phone that I do believe that there is a significant chance that she may need amputation of the right lower extremity to limit her infection.  We could consider a  repeat I&D to try to flush all these areas out 1 more time.  At this point we will wait until we see the results of her vascular evaluation.  LOS: 4 days    Durward Fortes 03/13/2020

## 2020-03-14 ENCOUNTER — Inpatient Hospital Stay: Payer: Medicare Other

## 2020-03-14 DIAGNOSIS — A4902 Methicillin resistant Staphylococcus aureus infection, unspecified site: Secondary | ICD-10-CM

## 2020-03-14 DIAGNOSIS — L089 Local infection of the skin and subcutaneous tissue, unspecified: Secondary | ICD-10-CM

## 2020-03-14 DIAGNOSIS — E119 Type 2 diabetes mellitus without complications: Secondary | ICD-10-CM

## 2020-03-14 DIAGNOSIS — I96 Gangrene, not elsewhere classified: Secondary | ICD-10-CM

## 2020-03-14 DIAGNOSIS — R0602 Shortness of breath: Secondary | ICD-10-CM

## 2020-03-14 DIAGNOSIS — R739 Hyperglycemia, unspecified: Secondary | ICD-10-CM

## 2020-03-14 LAB — CBC WITH DIFFERENTIAL/PLATELET
Abs Immature Granulocytes: 1.02 10*3/uL — ABNORMAL HIGH (ref 0.00–0.07)
Basophils Absolute: 0.1 10*3/uL (ref 0.0–0.1)
Basophils Relative: 1 %
Eosinophils Absolute: 0.4 10*3/uL (ref 0.0–0.5)
Eosinophils Relative: 2 %
HCT: 24.1 % — ABNORMAL LOW (ref 36.0–46.0)
Hemoglobin: 8 g/dL — ABNORMAL LOW (ref 12.0–15.0)
Immature Granulocytes: 6 %
Lymphocytes Relative: 18 %
Lymphs Abs: 3.1 10*3/uL (ref 0.7–4.0)
MCH: 27.8 pg (ref 26.0–34.0)
MCHC: 33.2 g/dL (ref 30.0–36.0)
MCV: 83.7 fL (ref 80.0–100.0)
Monocytes Absolute: 1 10*3/uL (ref 0.1–1.0)
Monocytes Relative: 6 %
Neutro Abs: 12 10*3/uL — ABNORMAL HIGH (ref 1.7–7.7)
Neutrophils Relative %: 67 %
Platelets: 673 10*3/uL — ABNORMAL HIGH (ref 150–400)
RBC: 2.88 MIL/uL — ABNORMAL LOW (ref 3.87–5.11)
RDW: 13.1 % (ref 11.5–15.5)
Smear Review: NORMAL
WBC: 17.5 10*3/uL — ABNORMAL HIGH (ref 4.0–10.5)
nRBC: 0.2 % (ref 0.0–0.2)

## 2020-03-14 LAB — GLUCOSE, CAPILLARY
Glucose-Capillary: 158 mg/dL — ABNORMAL HIGH (ref 70–99)
Glucose-Capillary: 163 mg/dL — ABNORMAL HIGH (ref 70–99)
Glucose-Capillary: 191 mg/dL — ABNORMAL HIGH (ref 70–99)
Glucose-Capillary: 190 mg/dL — ABNORMAL HIGH (ref 70–99)

## 2020-03-14 LAB — COMPREHENSIVE METABOLIC PANEL
ALT: 7 U/L (ref 0–44)
AST: 9 U/L — ABNORMAL LOW (ref 15–41)
Albumin: 1.7 g/dL — ABNORMAL LOW (ref 3.5–5.0)
Alkaline Phosphatase: 116 U/L (ref 38–126)
Anion gap: 11 (ref 5–15)
BUN: 13 mg/dL (ref 6–20)
CO2: 19 mmol/L — ABNORMAL LOW (ref 22–32)
Calcium: 8.1 mg/dL — ABNORMAL LOW (ref 8.9–10.3)
Chloride: 104 mmol/L (ref 98–111)
Creatinine, Ser: 0.97 mg/dL (ref 0.44–1.00)
GFR calc Af Amer: >60 mL/min (ref >60)
GFR calc non Af Amer: >60 mL/min (ref >60)
Glucose, Bld: 134 mg/dL — ABNORMAL HIGH (ref 70–99)
Potassium: 3.9 mmol/L (ref 3.5–5.1)
Sodium: 134 mmol/L — ABNORMAL LOW (ref 135–145)
Total Bilirubin: 0.5 mg/dL (ref 0.3–1.2)
Total Protein: 6.8 g/dL (ref 6.5–8.1)

## 2020-03-14 LAB — AEROBIC/ANAEROBIC CULTURE W GRAM STAIN (SURGICAL/DEEP WOUND)

## 2020-03-14 LAB — CULTURE, BLOOD (ROUTINE X 2)
Culture: NO GROWTH
Special Requests: ADEQUATE

## 2020-03-14 LAB — SURGICAL PATHOLOGY

## 2020-03-14 LAB — MRSA PCR SCREENING: MRSA by PCR: NEGATIVE

## 2020-03-14 LAB — PHOSPHORUS: Phosphorus: 3.1 mg/dL (ref 2.5–4.6)

## 2020-03-14 LAB — MAGNESIUM: Magnesium: 1.6 mg/dL — ABNORMAL LOW (ref 1.7–2.4)

## 2020-03-14 MED ORDER — LEVALBUTEROL HCL 0.63 MG/3ML IN NEBU
0.6300 mg | INHALATION_SOLUTION | Freq: Four times a day (QID) | RESPIRATORY_TRACT | Status: DC | PRN
  Filled 2020-03-14: qty 3

## 2020-03-14 MED ORDER — IPRATROPIUM BROMIDE 0.02 % IN SOLN: 0.5000 mg | Freq: Four times a day (QID) | RESPIRATORY_TRACT | Status: DC | PRN

## 2020-03-14 MED ORDER — MAGNESIUM SULFATE 2 GM/50ML IV SOLN
2.0000 g | Freq: Once | INTRAVENOUS | Status: AC
  Administered 2020-03-14: 2 g via INTRAVENOUS
  Filled 2020-03-14: qty 50

## 2020-03-14 MED ORDER — PANTOPRAZOLE SODIUM 40 MG PO TBEC
40.0000 mg | DELAYED_RELEASE_TABLET | Freq: Every day | ORAL | Status: DC
  Administered 2020-03-14 – 2020-03-17 (×3): 40 mg via ORAL
  Filled 2020-03-14 (×3): qty 1

## 2020-03-14 NOTE — Progress Notes (Signed)
1 Day Post-Op   Subjective/Chief Complaint: Patient seen.  States she is still having some significant pain with the foot.   Objective: Vital signs in last 24 hours: Temp:  [97.9 F (36.6 C)-98.7 F (37.1 C)] 97.9 F (36.6 C) (09/15 0741) Pulse Rate:  [89-107] 97 (09/15 0741) Resp:  [16-22] 18 (09/15 0741) BP: (104-142)/(58-97) 131/72 (09/15 0741) SpO2:  [90 %-98 %] 97 % (09/15 0741) Weight:  [75.8 kg] 75.8 kg (09/14 0935) Last BM Date: 03/13/20  Intake/Output from previous day: 09/14 0701 - 09/15 0700 In: 363 [IV Piggyback:363] Out: 1400 [Urine:1400] Intake/Output this shift: No intake/output data recorded.  The bandages dry and intact on the right foot.  Upon removal there is still some moderate to heavy drainage on the bandaging.  Still some expressible purulence and drainage.  Erythema dorsally appears to be improving with still some moderate erythema in the arch area.  White blood cell count is slowly trending downward.  Lab Results:  Recent Labs    03/13/20 0656 03/14/20 0430  WBC 19.5* 17.5*  HGB 8.3* 8.0*  HCT 25.0* 24.1*  PLT 637* 673*   BMET Recent Labs    03/13/20 0656 03/14/20 0430  NA 133* 134*  K 4.1 3.9  CL 101 104  CO2 22 19*  GLUCOSE 147* 134*  BUN 18 13  CREATININE 1.01* 0.97  CALCIUM 8.1* 8.1*   PT/INR No results for input(s): LABPROT, INR in the last 72 hours. ABG Recent Labs    03/12/20 1030  PHART 7.43  HCO3 18.6*    Studies/Results: DG Chest 1 View  Result Date: 03/14/2020 CLINICAL DATA:  Shortness of breath. EXAM: CHEST  1 VIEW COMPARISON:  March 12, 2020 FINDINGS: Decreased lung volumes are again seen which is likely, in part, secondary to the degree of patient inspiration. Very mildly increased bronchovascular lung markings are noted without visualization of the patchy bilateral airspace opacities seen on the prior study. There is no evidence of a pleural effusion or pneumothorax. The heart size and mediastinal contours  are within normal limits. The visualized skeletal structures are unremarkable. IMPRESSION: Very mildly increased bronchovascular lung markings without visualization of the patchy bilateral airspace opacities seen on the prior chest plain film. Electronically Signed   By: Virgina Norfolk M.D.   On: 03/14/2020 03:11   CT HEAD WO CONTRAST  Result Date: 03/12/2020 CLINICAL DATA:  Delirium, new from baseline. Recent amputation of toe. EXAM: CT HEAD WITHOUT CONTRAST TECHNIQUE: Contiguous axial images were obtained from the base of the skull through the vertex without intravenous contrast. COMPARISON:  CT head without contrast 02/19/2020 FINDINGS: Brain: No acute infarct, hemorrhage, or mass lesion is present. No significant white matter lesions are present. The ventricles are of normal size. No significant extraaxial fluid collection is present. The brainstem and cerebellum are within normal limits. Vascular: No hyperdense vessel or unexpected calcification. Skull: Calvarium is intact. No focal lytic or blastic lesions are present. No significant extracranial soft tissue lesion is present. Sinuses/Orbits: The paranasal sinuses and mastoid air cells are clear. The globes and orbits are within normal limits. IMPRESSION: Negative CT of the head. Electronically Signed   By: San Morelle M.D.   On: 03/12/2020 11:41   CT CHEST WO CONTRAST  Result Date: 03/12/2020 CLINICAL DATA:  Respiratory failure. EXAM: CT CHEST WITHOUT CONTRAST TECHNIQUE: Multidetector CT imaging of the chest was performed following the standard protocol without IV contrast. COMPARISON:  None. FINDINGS: Cardiovascular: No evidence of thoracic aortic aneurysm is noted.  Minimal pericardial effusion is noted. Coronary artery calcifications are noted. Normal cardiac size. Mediastinum/Nodes: No enlarged mediastinal or axillary lymph nodes. Thyroid gland, trachea, and esophagus demonstrate no significant findings. Lungs/Pleura: No pneumothorax is  noted. No pleural effusion is noted. Patchy airspace opacities are noted posteriorly in the upper and lower lobes bilaterally consistent with multifocal pneumonia. Upper Abdomen: No acute abnormality. Musculoskeletal: No chest wall mass or suspicious bone lesions identified. IMPRESSION: 1. Patchy airspace opacities are noted posteriorly in the upper and lower lobes bilaterally consistent with multifocal pneumonia. 2. Coronary artery calcifications are noted suggesting coronary artery disease. 3. Minimal pericardial effusion is noted. Electronically Signed   By: Marijo Conception M.D.   On: 03/12/2020 17:52   PERIPHERAL VASCULAR CATHETERIZATION  Result Date: 03/13/2020 See op note   Anti-infectives: Anti-infectives (From admission, onward)   Start     Dose/Rate Route Frequency Ordered Stop   03/13/20 0200  vancomycin (VANCOREADY) IVPB 1500 mg/300 mL        1,500 mg 150 mL/hr over 120 Minutes Intravenous Every 24 hours 03/12/20 0915     03/12/20 2200  cefTRIAXone (ROCEPHIN) 2 g in sodium chloride 0.9 % 100 mL IVPB        2 g 200 mL/hr over 30 Minutes Intravenous Every 24 hours 03/12/20 1535     03/12/20 1800  azithromycin (ZITHROMAX) 500 mg in sodium chloride 0.9 % 250 mL IVPB        500 mg 250 mL/hr over 60 Minutes Intravenous Every 24 hours 03/12/20 1545     03/12/20 0440  vancomycin variable dose per unstable renal function (pharmacist dosing)  Status:  Discontinued         Does not apply See admin instructions 03/12/20 0440 03/12/20 0915   03/10/20 2018  vancomycin (VANCOCIN) powder  Status:  Discontinued          As needed 03/10/20 2018 03/10/20 2042   03/09/20 1400  vancomycin (VANCOCIN) IVPB 1000 mg/200 mL premix  Status:  Discontinued        1,000 mg 200 mL/hr over 60 Minutes Intravenous Every 12 hours 03/09/20 0301 03/12/20 0440   03/09/20 0900  ceFEPIme (MAXIPIME) 2 g in sodium chloride 0.9 % 100 mL IVPB  Status:  Discontinued        2 g 200 mL/hr over 30 Minutes Intravenous Every 8  hours 03/09/20 0144 03/12/20 1535   03/09/20 0200  vancomycin (VANCOREADY) IVPB 500 mg/100 mL        500 mg 100 mL/hr over 60 Minutes Intravenous  Once 03/09/20 0149 03/09/20 0437   03/09/20 0145  vancomycin (VANCOCIN) IVPB 1000 mg/200 mL premix  Status:  Discontinued        1,000 mg 200 mL/hr over 60 Minutes Intravenous  Once 03/09/20 0144 03/09/20 0149   03/09/20 0030  ceFEPIme (MAXIPIME) 2 g in sodium chloride 0.9 % 100 mL IVPB        2 g 200 mL/hr over 30 Minutes Intravenous  Once 03/09/20 0019 03/09/20 0133   03/09/20 0030  metroNIDAZOLE (FLAGYL) IVPB 500 mg        500 mg 100 mL/hr over 60 Minutes Intravenous  Once 03/09/20 0019 03/09/20 0214   03/09/20 0030  vancomycin (VANCOCIN) IVPB 1000 mg/200 mL premix        1,000 mg 200 mL/hr over 60 Minutes Intravenous  Once 03/09/20 0019 03/09/20 0332      Assessment/Plan: s/p Procedure(s): Lower Extremity Angiography (Right) Assessment: Stable but guarded condition status post  I&D.   Plan: Betadine and a sterile bandage reapplied to all areas on the right foot.  Discussed with the patient that due to the continued infection I think we need to repeat the I&D on her right foot to give her the best chance for limb salvage.  Again discussed possible risks and complications of the procedure and anesthesia including but not limited to inability to heal due to her diabetes or extent of infection with still significant risk for amputation of the right lower extremity.  Questions invited and answered.  At this point it appears our best chance for getting in the operating room will be tomorrow morning.  Plan for surgery tomorrow morning.  Consent form for I&D multiple sites right foot.  N.p.o. after midnight.  LOS: 5 days    Durward Fortes 03/14/2020

## 2020-03-14 NOTE — Consult Note (Signed)
And CXRNAME: Kristy Mcmillan  DOB: 11-25-1975  MRN: [id number]  Date/Time: 03/14/2020 10:20 AM  REQUESTING PROVIDER: Kurtis Bushman Subjective:  REASON FOR CONSULT: rt foot infection ? Kristy Mcmillan is a 44 y.o.female  with a history of DM, seizure disorder presented to the ED on 03/08/20 with rt foot being swollen and red and small toe infection. As per patient she had pedicure and a day or so later the 5th toe looked red and a few days later the whole foot was swollen and painful. She came to the ED on 03/08/20 Vitals in the ED were 100.5, BP 113/82, RR 20 HR 118 Wbc was 26.8, Hb 10.5, PLT 578, Na 130, cr 1.17, blood culture sent and she was started on vanco, cefepime and flagyl Pt underwent rt fifth toe  Amputation with partial ray resection and I/D of the abscesses-on 03/10/20 under LMA  MRSA in culture On 9/13 she was found to be confused with overnight oxygen requirements of 6l CT chest revealed b/l upper lobe infiltrates and she was started on ceftriaxone and azithromycin  Patient underwent angiogram on 03/13/2020 and had a transluminal angioplasty of the right tibioperoneal trunk and peroneal artery. I am asked to see the patient because of MRSA in the culture.  Past Medical History:  Diagnosis Date  . Diabetes mellitus without complication ([place])   . Seizures ([place]) 2005 Last sz   childhood febrile sz, then ?eclampsia    Past Surgical History:  Procedure Laterality Date  . AMPUTATION TOE Right 03/10/2020   Procedure: AMPUTATION FIFTH RAY WITH IRRIGATION AND DEBRIDEMENT RIGHT FOOT;  Surgeon: Sharlotte Alamo, DPM;  Location: [facility];  Service: Podiatry;  Laterality: Right;  . CESAREAN SECTION    . CESAREAN SECTION N/A 03/02/2013   Procedure: CESAREAN SECTION;  Surgeon: Florian Buff, MD;  Location: [facility];  Service: Obstetrics;  Laterality: N/A;  Repeat Cesarean Section Delivery Nonviable Baby Girl @ 2158  . LOWER EXTREMITY ANGIOGRAPHY Right 03/13/2020   Procedure: Lower Extremity Angiography;   Surgeon: Katha Cabal, MD;  Location: [facility];  Service: Cardiovascular;  Laterality: Right;    Social History   Socioeconomic History  . Marital status: Single    Spouse name: Not on file  . Number of children: Not on file  . Years of education: Not on file  . Highest education level: Not on file  Occupational History  . Not on file  Tobacco Use  . Smoking status: Current Some Day Smoker  . Smokeless tobacco: Never Used  Substance and Sexual Activity  . Alcohol use: No  . Drug use: No  . Sexual activity: Yes  Other Topics Concern  . Not on file  Social History Narrative  . Not on file   Social Determinants of Health   Financial Resource Strain:   . Difficulty of Paying Living Expenses: Not on file  Food Insecurity:   . Worried About Charity fundraiser in the Last Year: Not on file  . Ran Out of Food in the Last Year: Not on file  Transportation Needs:   . Lack of Transportation (Medical): Not on file  . Lack of Transportation (Non-Medical): Not on file  Physical Activity:   . Days of Exercise per Week: Not on file  . Minutes of Exercise per Session: Not on file  Stress:   . Feeling of Stress : Not on file  Social Connections:   . Frequency of Communication with Friends and Family: Not on file  .  Frequency of Social Gatherings with Friends and Family: Not on file  . Attends Religious Services: Not on file  . Active Member of Clubs or Organizations: Not on file  . Attends Archivist Meetings: Not on file  . Marital Status: Not on file  Intimate Partner Violence:   . Fear of Current or Ex-Partner: Not on file  . Emotionally Abused: Not on file  . Physically Abused: Not on file  . Sexually Abused: Not on file    Family History  Problem Relation Age of Onset  . Diabetes Mother   . Diabetes Father    Allergies  Allergen Reactions  . Dilantin [Phenytoin Sodium Extended] Other (See Comments)    Causes seizures  . Phenobarbital Other  (See Comments)    Causes seizures Other reaction(s): Unknown Causes seizures  . Phenytoin Sodium Extended     Other reaction(s): Other (See Comments) Causes seizures  . Aspartame Diarrhea and Nausea And Vomiting  . Aspartame And Phenylalanine Diarrhea and Nausea And Vomiting  . Carbamazepine Other (See Comments)    Other reaction(s): Unknown  . Gabapentin Other (See Comments)    Other reaction(s): Unknown Feels like she is going to have a seizure  . Ciprofloxacin Rash    blisters  . Humalog [Insulin Lispro] Rash  . Lantus [Insulin Glargine] Rash  . Novolin 70-30 [Insulin Nph Isophane & Regular] Itching and Rash  . Penicillins Itching and Rash    ? Current Facility-Administered Medications  Medication Dose Route Frequency Provider Last Rate Last Admin  . 0.9 %  sodium chloride infusion  250 mL Intravenous PRN Schnier, Dolores Lory, MD      . acetaminophen (TYLENOL) tablet 650 mg  650 mg Oral Q6H PRN Schnier, Dolores Lory, MD   650 mg at 03/14/20 0849   Or  . acetaminophen (TYLENOL) suppository 650 mg  650 mg Rectal Q6H PRN Schnier, Dolores Lory, MD      . aspirin EC tablet 81 mg  81 mg Oral Daily Schnier, Dolores Lory, MD   81 mg at 03/13/20 1157  . atorvastatin (LIPITOR) tablet 40 mg  40 mg Oral QHS Schnier, Dolores Lory, MD   40 mg at 03/12/20 2217  . azithromycin (ZITHROMAX) 500 mg in sodium chloride 0.9 % 250 mL IVPB  500 mg Intravenous Q24H Schnier, Dolores Lory, MD 250 mL/hr at 03/13/20 1736 500 mg at 03/13/20 1736  . calcium-vitamin D (OSCAL WITH D) 500-200 MG-UNIT per tablet 1 tablet  1 tablet Oral Q breakfast Schnier, Dolores Lory, MD   1 tablet at 03/13/20 1338  . cefTRIAXone (ROCEPHIN) 2 g in sodium chloride 0.9 % 100 mL IVPB  2 g Intravenous Q24H Schnier, Dolores Lory, MD 200 mL/hr at 03/13/20 2212 2 g at 03/13/20 2212  . cholecalciferol (VITAMIN D3) tablet 1,000 Units  1,000 Units Oral Daily Schnier, Dolores Lory, MD   1,000 Units at 03/11/20 0959  . clopidogrel (PLAVIX) tablet 75 mg  75 mg Oral  Q breakfast Schnier, Dolores Lory, MD      . DULoxetine (CYMBALTA) DR capsule 30 mg  30 mg Oral BID Schnier, Dolores Lory, MD   30 mg at 03/10/20 0845  . enoxaparin (LOVENOX) injection 40 mg  40 mg Subcutaneous Q24H Schnier, Dolores Lory, MD   40 mg at 03/12/20 0900  . HYDROmorphone (DILAUDID) injection 1 mg  1 mg Intravenous Once PRN Schnier, Dolores Lory, MD      . insulin aspart (novoLOG) injection 0-5 Units  0-5 Units Subcutaneous  QHS Schnier, Dolores Lory, MD   2 Units at 03/12/20 2218  . insulin aspart (novoLOG) injection 0-9 Units  0-9 Units Subcutaneous TID WC Schnier, Dolores Lory, MD   3 Units at 03/13/20 1803  . insulin detemir (LEVEMIR) injection 10 Units  10 Units Subcutaneous QHS Schnier, Dolores Lory, MD   10 Units at 03/12/20 2217  . ipratropium (ATROVENT) nebulizer solution 0.5 mg  0.5 mg Nebulization TID Delana Meyer Dolores Lory, MD   0.5 mg at 03/14/20 0730  . levalbuterol (XOPENEX) nebulizer solution 0.63 mg  0.63 mg Nebulization TID Delana Meyer Dolores Lory, MD   0.63 mg at 03/14/20 0731  . linagliptin (TRADJENTA) tablet 5 mg  5 mg Oral Daily Schnier, Dolores Lory, MD   5 mg at 03/13/20 1335  . lisinopril (ZESTRIL) tablet 2.5 mg  2.5 mg Oral Daily Schnier, Dolores Lory, MD   2.5 mg at 03/13/20 1340  . morphine 2 MG/ML injection 2 mg  2 mg Intravenous Q4H PRN Schnier, Dolores Lory, MD      . morphine 4 MG/ML injection 2 mg  2 mg Intravenous Q1H PRN Schnier, Dolores Lory, MD      . ondansetron [facility]) injection 4 mg  4 mg Intravenous Q6H PRN Schnier, Dolores Lory, MD      . ondansetron [facility]) injection 4 mg  4 mg Intravenous Q6H PRN Schnier, Dolores Lory, MD      . ondansetron [facility]) tablet 4 mg  4 mg Oral Q6H PRN Schnier, Dolores Lory, MD      . oxyCODONE-acetaminophen (PERCOCET/ROXICET) 5-325 MG per tablet 1-2 tablet  1-2 tablet Oral Q4H PRN Schnier, Dolores Lory, MD   2 tablet at 03/13/20 1741  . sodium chloride flush (NS) 0.9 % injection 3 mL  3 mL Intravenous Q12H Schnier, Dolores Lory, MD   3 mL at 03/13/20 2210  . sodium  chloride flush (NS) 0.9 % injection 3 mL  3 mL Intravenous PRN Schnier, Dolores Lory, MD      . terbinafine (LAMISIL) 1 % cream   Topical BID Schnier, Dolores Lory, MD   Given at 03/11/20 2234  . vancomycin (VANCOREADY) IVPB 1500 mg/300 mL  1,500 mg Intravenous Q24H Schnier, Dolores Lory, MD 150 mL/hr at 03/14/20 0113 1,500 mg at 03/14/20 0113  . vitamin B-12 (CYANOCOBALAMIN) tablet 500 mcg  500 mcg Oral Daily Schnier, Dolores Lory, MD   500 mcg at 03/13/20 1333  . zonisamide (ZONEGRAN) capsule 100 mg  100 mg Oral Daily Schnier, Dolores Lory, MD   100 mg at 03/13/20 1334     Abtx:  Anti-infectives (From admission, onward)   Start     Dose/Rate Route Frequency Ordered Stop   03/13/20 0200  vancomycin (VANCOREADY) IVPB 1500 mg/300 mL        1,500 mg 150 mL/hr over 120 Minutes Intravenous Every 24 hours 03/12/20 0915     03/12/20 2200  cefTRIAXone (ROCEPHIN) 2 g in sodium chloride 0.9 % 100 mL IVPB        2 g 200 mL/hr over 30 Minutes Intravenous Every 24 hours 03/12/20 1535     03/12/20 1800  azithromycin (ZITHROMAX) 500 mg in sodium chloride 0.9 % 250 mL IVPB        500 mg 250 mL/hr over 60 Minutes Intravenous Every 24 hours 03/12/20 1545     03/12/20 0440  vancomycin variable dose per unstable renal function (pharmacist dosing)  Status:  Discontinued         Does not apply See admin  instructions 03/12/20 0440 03/12/20 0915   03/10/20 2018  vancomycin (VANCOCIN) powder  Status:  Discontinued          As needed 03/10/20 2018 03/10/20 2042   03/09/20 1400  vancomycin (VANCOCIN) IVPB 1000 mg/200 mL premix  Status:  Discontinued        1,000 mg 200 mL/hr over 60 Minutes Intravenous Every 12 hours 03/09/20 0301 03/12/20 0440   03/09/20 0900  ceFEPIme (MAXIPIME) 2 g in sodium chloride 0.9 % 100 mL IVPB  Status:  Discontinued        2 g 200 mL/hr over 30 Minutes Intravenous Every 8 hours 03/09/20 0144 03/12/20 1535   03/09/20 0200  vancomycin (VANCOREADY) IVPB 500 mg/100 mL        500 mg 100 mL/hr over 60  Minutes Intravenous  Once 03/09/20 0149 03/09/20 0437   03/09/20 0145  vancomycin (VANCOCIN) IVPB 1000 mg/200 mL premix  Status:  Discontinued        1,000 mg 200 mL/hr over 60 Minutes Intravenous  Once 03/09/20 0144 03/09/20 0149   03/09/20 0030  ceFEPIme (MAXIPIME) 2 g in sodium chloride 0.9 % 100 mL IVPB        2 g 200 mL/hr over 30 Minutes Intravenous  Once 03/09/20 0019 03/09/20 0133   03/09/20 0030  metroNIDAZOLE (FLAGYL) IVPB 500 mg        500 mg 100 mL/hr over 60 Minutes Intravenous  Once 03/09/20 0019 03/09/20 0214   03/09/20 0030  vancomycin (VANCOCIN) IVPB 1000 mg/200 mL premix        1,000 mg 200 mL/hr over 60 Minutes Intravenous  Once 03/09/20 0019 03/09/20 0332      REVIEW OF SYSTEMS:  Const:fever,  chills, negative weight loss Eyes: negative diplopia or visual changes, negative eye pain ENT: negative coryza, negative sore throat Resp: negative cough, hemoptysis, dyspnea Cards: negative for chest pain, palpitations, lower extremity edema GU: negative for frequency, dysuria and hematuria GI: Negative for abdominal pain, diarrhea, bleeding, constipation Skin: negative for rash and pruritus Heme: negative for easy bruising and gum/nose bleeding MS: as above  Neurolo:negative for headaches, dizziness, vertigo, memory problems  Psych: negative for feelings of anxiety, depression  Endocrine: has diabetes- says a1c around 8 Allergy/Immunology- does not tolerate many meds Objective:  VITALS:  BP 131/72 (BP Location: Left Arm)   Pulse 97   Temp 97.9 F (36.6 C) (Oral)   Resp 18   Ht 5\' 4"  (1.626 m)   Wt 75.8 kg   SpO2 97%   BMI 28.68 kg/m  PHYSICAL EXAM:  General: Alert, cooperative, no distress, appears stated age. Speech slurred Head: Normocephalic, without obvious abnormality, atraumatic. Eyes: Conjunctivae clear, anicteric sclerae. Pupils are equal ENT Nares normal. No drainage or sinus tenderness. Lips, mucosa, and tongue normal. No Thrush, poor dentition-  only 3 teeth left  Neck: Supple, symmetrical, no adenopathy, thyroid: non tender no carotid bruit and no JVD. Back: No CVA tenderness. Lungs: Clear to auscultation bilaterally. No Wheezing or Rhonchi. No rales. Heart: Regular rate and rhythm, no murmur, rub or gallop. Abdomen: Soft, non-tender,not distended. Bowel sounds normal. No masses Extremities:  Rt foot dressing removed- 5th toe amputated-  necrotic wound on the dorsum- better than before- erythema and swelling better than before          Skin: No rashes or lesions. Or bruising Lymph: Cervical, supraclavicular normal. Neurologic: Grossly non-focal Pertinent Labs Lab Results CBC    Component Value Date/Time   WBC 17.5 (H)  03/14/2020 0430   RBC 2.88 (L) 03/14/2020 0430   HGB 8.0 (L) 03/14/2020 0430   HGB 11.6 (L) 10/23/2012 1807   HCT 24.1 (L) 03/14/2020 0430   HCT 34.0 (L) 10/23/2012 1807   PLT 673 (H) 03/14/2020 0430   PLT 367 10/23/2012 1807   MCV 83.7 03/14/2020 0430   MCV 83 10/23/2012 1807   MCH 27.8 03/14/2020 0430   MCHC 33.2 03/14/2020 0430   RDW 13.1 03/14/2020 0430   RDW 12.8 10/23/2012 1807   LYMPHSABS 3.1 03/14/2020 0430   MONOABS 1.0 03/14/2020 0430   EOSABS 0.4 03/14/2020 0430   BASOSABS 0.1 03/14/2020 0430    CMP Latest Ref Rng & Units 03/14/2020 03/13/2020 03/12/2020  Glucose 70 - 99 mg/dL 134(H) 147(H) 112(H)  BUN 6 - 20 mg/dL 13 18 19   Creatinine 0.44 - 1.00 mg/dL 0.97 1.01(H) 0.93  Sodium 135 - 145 mmol/L 134(L) 133(L) 132(L)  Potassium 3.5 - 5.1 mmol/L 3.9 4.1 4.0  Chloride 98 - 111 mmol/L 104 101 102  CO2 22 - 32 mmol/L 19(L) 22 19(L)  Calcium 8.9 - 10.3 mg/dL 8.1(L) 8.1(L) 8.0(L)  Total Protein 6.5 - 8.1 g/dL 6.8 7.2 -  Total Bilirubin 0.3 - 1.2 mg/dL 0.5 0.4 -  Alkaline Phos 38 - 126 U/L 116 139(H) -  AST 15 - 41 U/L 9(L) 12(L) -  ALT 0 - 44 U/L 7 8 -      Microbiology: Recent Results (from the past 240 hour(s))  Blood culture (routine x 2)     Status: None   Collection  Time: 03/08/20  7:26 PM   Specimen: BLOOD  Result Value Ref Range Status   Specimen Description BLOOD RIGHT ANTECUBITAL  Final   Special Requests   Final    BOTTLES DRAWN AEROBIC AND ANAEROBIC Blood Culture results may not be optimal due to an excessive volume of blood received in culture bottles   Culture   Final    NO GROWTH 5 DAYS Performed at [facility], [address], [place], [place] [address]    Report Status 03/13/2020 FINAL  Final  Blood culture (routine x 2)     Status: None   Collection Time: 03/09/20 12:55 AM   Specimen: BLOOD  Result Value Ref Range Status   Specimen Description BLOOD LEFT [facility]  Final   Special Requests   Final    BOTTLES DRAWN AEROBIC AND ANAEROBIC Blood Culture adequate volume   Culture   Final    NO GROWTH 5 DAYS Performed at [facility], [address], [place], [place] [address]    Report Status 03/14/2020 FINAL  Final  SARS Coronavirus 2 by RT PCR (hospital order, performed in [facility] hospital lab) Nasopharyngeal Nasopharyngeal Swab     Status: None   Collection Time: 03/09/20 12:55 AM   Specimen: Nasopharyngeal Swab  Result Value Ref Range Status   SARS Coronavirus 2 NEGATIVE NEGATIVE Final    Comment: (NOTE) SARS-CoV-2 target nucleic acids are NOT DETECTED.  The SARS-CoV-2 RNA is generally detectable in upper and lower respiratory specimens during the acute phase of infection. The lowest concentration of SARS-CoV-2 viral copies this assay can detect is 250 copies / mL. A negative result does not preclude SARS-CoV-2 infection and should not be used as the sole basis for treatment or other patient management decisions.  A negative result may occur with improper specimen collection / handling, submission of specimen other than nasopharyngeal swab, presence of viral mutation(s) within the areas  targeted by this assay, and inadequate number of viral copies (<250 copies / mL). A negative result must be combined with  clinical observations, patient history, and epidemiological information.  Fact Sheet for Patients:   StrictlyIdeas.no  Fact Sheet for Healthcare Providers: BankingDealers.co.za  This test is not yet approved or  cleared by the [place] FDA and has been authorized for detection and/or diagnosis of SARS-CoV-2 by FDA under an Emergency Use Authorization (EUA).  This EUA will remain in effect (meaning this test can be used) for the duration of the COVID-19 declaration under Section 564(b)(1) of the Act, 21 U.S.C. section 360bbb-3(b)(1), unless the authorization is terminated or revoked sooner.  Performed at [facility], [address] [place], [place] [address]   Aerobic/Anaerobic Culture (surgical/deep wound)     Status: None (Preliminary result)   Collection Time: 03/09/20  5:28 PM   Specimen: Wound; Abscess  Result Value Ref Range Status   Specimen Description WOUND RIGHT FOOT  Final   Special Requests NONE  Final   Gram Stain   Final    RARE WBC PRESENT, PREDOMINANTLY PMN NO ORGANISMS SEEN Performed at [facility], [address], [place], [place] [address]    Culture   Final    FEW METHICILLIN RESISTANT STAPHYLOCOCCUS AUREUS NO ANAEROBES ISOLATED; CULTURE IN PROGRESS FOR 5 DAYS    Report Status PENDING  Incomplete   Organism ID, Bacteria METHICILLIN RESISTANT STAPHYLOCOCCUS AUREUS  Final      Susceptibility   Methicillin resistant staphylococcus aureus - MIC*    CIPROFLOXACIN >=8 RESISTANT Resistant     ERYTHROMYCIN >=8 RESISTANT Resistant     GENTAMICIN <=0.5 SENSITIVE Sensitive     OXACILLIN >=4 RESISTANT Resistant     TETRACYCLINE <=1 SENSITIVE Sensitive     VANCOMYCIN <=0.5 SENSITIVE Sensitive     TRIMETH/SULFA 80 RESISTANT Resistant     CLINDAMYCIN <=0.25 SENSITIVE Sensitive     RIFAMPIN <=0.5 SENSITIVE Sensitive     Inducible Clindamycin NEGATIVE Sensitive     * FEW METHICILLIN RESISTANT  STAPHYLOCOCCUS AUREUS  Aerobic/Anaerobic Culture (surgical/deep wound)     Status: None (Preliminary result)   Collection Time: 03/10/20  8:03 PM   Specimen: PATH Other; Wound  Result Value Ref Range Status   Specimen Description   Final    ABSCESS Performed at [facility], [address], [place], [place] [address]    Special Requests DEEP  Final   Gram Stain   Final    FEW WBC PRESENT, PREDOMINANTLY PMN FEW GRAM POSITIVE COCCI IN PAIRS IN CLUSTERS Performed at [facility], [place] [address], [place], [place] [address]    Culture   Final    ABUNDANT STAPHYLOCOCCUS AUREUS NO ANAEROBES ISOLATED; CULTURE IN PROGRESS FOR 5 DAYS    Report Status PENDING  Incomplete    IMAGING RESULTS: 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 3. Suspect large dorsal and smaller plantar forefoot abscesses as detailed above. I have personally reviewed the films ? Impression/Recommendation ? ?MRSA infection of the rt foot- s/p 5th toe amputation Vascular planning angi tomorrow Continue  Vanco. Await further surgical management to decide on duration and route of antibiotic   Pneumonia upper lobes b/l- on ceftriaxone Poor dentition- r/o aspiration though site of b/l upperlobe is unusual  DM- management as per primary team on insulin and trajenta ( linagliptin)  ? ___________________________________________________ Discussed with patient, and  her husband Note:  This document was prepared using Systems analyst and may include unintentional dictation errors.

## 2020-03-14 NOTE — Anesthesia Postprocedure Evaluation (Signed)
Anesthesia Post Note  Patient: Kristy Mcmillan  Procedure(s) Performed: AMPUTATION FIFTH RAY WITH IRRIGATION AND DEBRIDEMENT RIGHT FOOT (Right Toe)  Patient location during evaluation: PACU Anesthesia Type: General Level of consciousness: awake and alert Pain management: pain level controlled Vital Signs Assessment: post-procedure vital signs reviewed and stable Respiratory status: spontaneous breathing, nonlabored ventilation, respiratory function stable and patient connected to nasal cannula oxygen Cardiovascular status: blood pressure returned to baseline and stable Postop Assessment: no apparent nausea or vomiting Anesthetic complications: no   No complications documented.   Last Vitals:  Vitals:   03/14/20 0731 03/14/20 0741  BP:  131/72  Pulse:  97  Resp:  18  Temp:  36.6 C  SpO2: 97% 97%    Last Pain:  Vitals:   03/14/20 0741  TempSrc: Oral  PainSc:                  Molli Barrows

## 2020-03-14 NOTE — Progress Notes (Addendum)
Patient refusing meds at this time. Patient on phone with husband who is also reinforcing that patient doesn't like to take medicine because "they make her sick."  Patient stated, "I will take the pill for my sugar but not the shot, not the short acting or Levemir."  [id number]- Secure chat to Dr. Naida Sleight to notify patient only willing to take Tradjenta and Tylenol this am.

## 2020-03-14 NOTE — Progress Notes (Signed)
PROGRESS NOTE    Kristy Mcmillan  QMG:[id number] DOB: 1976/05/08 DOA: 03/09/2020 PCP: [facility] [place]    Brief Narrative:  The patient is a 44 year old overweight Caucasian female with a past medical history significant for but not limited to is mellitus type II, history of seizure disorder who presented to the emergency room with acute onset of worsening right foot swelling associated with erythema, pain and tenderness. She had a pedicure on 02/27/2020 before symptoms and began having a fever over the last couple days associated with chills. She denies any nausea or vomiting. She is not vaccinated against COVID-19 disease and upon presentation she is found to be febrile with a tachycardia and a respiratory rate of 29. Labs revealed borderline potassium of 2.5 and she did have a hyponatremia and hypochloremia and elevated blood sugar with a hyperglycemia of 428. In the ED sepsis protocol was started and she is given acetaminophen, IV cefepime, Flagyl and IV vancomycin as well as morphine sulfate IV Zofran. Her potassium was repleted as well. She was admitted for sepsis secondary to right foot extremity cellulitis with right fifth toe necrosis in the setting of an uncontrolled diabetic foot.   **Interim History She had bedside debridement done by podiatry when they were consulted. MRI was finally done. Vascular surgery evaluated and considering an angiogram early next week.  Continues to still be septic and had a temperature, tachycardia and tachypnea overnight which is now improved.  She also started having some loose stools likely antibiotic mediated.  03/11/20 MRI done yesterday showed "Exam significantly limited by patient motion and lack of contrast. 2. 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 3. Suspect large dorsal and smaller plantar forefoot abscesses as detailed  above. 4. Diffuse myofasciitis without obvious pyomyositis."   03/12/20 She was extremely confused today and would not answer questions appropriately.  And overnight her O2 requirements worsen and she needed 5 L.  She got put on oxygen after her surgical procedure and was not weaned off of it and now on 5 L.  Chest x-ray today showed multifocal pneumonia so will obtain a CT scan.  We will also give her IV Lasix, breathing treatments and because of her confusion and altered mental status will obtain a head CT scan without contrast, and ABG, TSH, RPR, vitamin B12 level.  Because they could not consent for the angiogram today given her confusion they are going to postpone it and attempted on Tuesday or Wednesday.  03/13/20 The patient is much more awake today and more oriented but still a little slower to respond.  She underwent an angiogram of her leg today by Dr. Delana Meyer and he felt as if he had a successful recanalization of the right lower extremity for limb salvage as she had a percutaneous transluminal angioplasty of the right tibial peroneal trunk and peroneal to 4 mm using a Lutonix drug-eluting balloon.  Her WBC is improved and she was weaned off of supplemental oxygen today and her repeat chest x-ray still pending.  She is at high risk for further amputation of the right lower extremity to limit her infection and podiatry is going to consider repeating an I&D to try to flush out the areas over time.    Consultants:   Podiatry, vascular  Procedures: s/p RLE angiogram with successful revascularization -on 9/14  Antimicrobials:       Subjective: Sleepy this am. Refuses to open eyes  or answer my questions.  Objective: Vitals:   03/13/20 2116 03/14/20 0111 03/14/20 0731 03/14/20 0741  BP: 121/65 125/65  131/72  Pulse: (!) 103 (!) 107  97  Resp: 16 17  18   Temp: 98.2 F (36.8 C) 98.7 F (37.1 C)  97.9 F (36.6 C)  TempSrc: Oral Oral  Oral  SpO2: 95% 96% 97% 97%  Weight:       Height:        Intake/Output Summary (Last 24 hours) at 03/14/2020 1621 Last data filed at 03/14/2020 1437 Gross per 24 hour  Intake 363.01 ml  Output 2400 ml  Net -2036.99 ml   Filed Weights   03/08/20 1911 03/13/20 0935  Weight: 75.8 kg 75.8 kg    Examination:  General exam: Appears calm and comfortable , sleeping, does not participate in exam Respiratory system: Clear to auscultation anteriorly. No w/r/r Cardiovascular system: S1 & S2 heard, RRR. No JVD, murmurs, rubs, gallops or clicks.  Gastrointestinal system: Abdomen is nondistended, soft and nontender. No organomegaly or masses felt. Normal bowel sounds heard. Central nervous system: Unable to assess Extremities: no edema , RLE wrapped in dressing Skin: Warm dry Psychiatry: Unable to assess    Data Reviewed: I have personally reviewed following labs and imaging studies  CBC: Recent Labs  Lab 03/10/20 0458 03/11/20 0911 03/12/20 0117 03/13/20 0656 03/14/20 0430  WBC 25.6* 28.4* 24.2* 19.5* 17.5*  NEUTROABS 20.5* 23.8* 18.9* 13.6* 12.0*  HGB 8.3* 8.2* 7.6* 8.3* 8.0*  HCT 24.3* 24.9* 22.9* 25.0* 24.1*  MCV 81.8 84.7 83.6 85.0 83.7  PLT 454* 530* 567* 637* 631*   Basic Metabolic Panel: Recent Labs  Lab 03/10/20 0458 03/10/20 0458 03/11/20 0911 03/12/20 0117 03/12/20 0649 03/13/20 0656 03/14/20 0430  NA 132*   < > 133* 133* 132* 133* 134*  K 3.6   < > 4.4 4.3 4.0 4.1 3.9  CL 100   < > 102 103 102 101 104  CO2 20*   < > 20* 22 19* 22 19*  GLUCOSE 188*   < > 145* 106* 112* 147* 134*  BUN 14   < > 17 20 19 18 13   CREATININE 0.75   < > 0.96 0.99 0.93 1.01* 0.97  CALCIUM 8.0*   < > 8.0* 8.3* 8.0* 8.1* 8.1*  MG 1.5*  --  1.9 1.8  --  1.8 1.6*  PHOS 3.0  --  3.7 3.7  --  2.8 3.1   < > = values in this interval not displayed.   GFR: Estimated Creatinine Clearance: 73.7 mL/min (by C-G formula based on SCr of 0.97 mg/dL). Liver Function Tests: Recent Labs  Lab 03/10/20 0458 03/11/20 0911  03/12/20 0117 03/13/20 0656 03/14/20 0430  AST 12* 12* 11* 12* 9*  ALT 9 8 9 8 7   ALKPHOS 113 131* 161* 139* 116  BILITOT 0.9 0.7 0.7 0.4 0.5  PROT 6.3* 7.0 6.9 7.2 6.8  ALBUMIN 1.8* 1.8* 1.6* 1.7* 1.7*   No results for input(s): LIPASE, AMYLASE in the last 168 hours. Recent Labs  Lab 03/13/20 0656  AMMONIA 16   Coagulation Profile: No results for input(s): INR, PROTIME in the last 168 hours. Cardiac Enzymes: No results for input(s): CKTOTAL, CKMB, CKMBINDEX, TROPONINI in the last 168 hours. BNP (last 3 results) No results for input(s): PROBNP in the last 8760 hours. HbA1C: No results for input(s): HGBA1C in the last 72 hours. CBG: Recent Labs  Lab 03/13/20 0955 03/13/20 1556 03/13/20 2113 03/14/20 4970  03/14/20 1135  GLUCAP 128* 212* 116* 158* 163*   Lipid Profile: No results for input(s): CHOL, HDL, LDLCALC, TRIG, CHOLHDL, LDLDIRECT in the last 72 hours. Thyroid Function Tests: Recent Labs    03/12/20 1107  TSH 0.532   Anemia Panel: Recent Labs    03/12/20 0117  VITAMINB12 439   Sepsis Labs: Recent Labs  Lab 03/08/20 1925 03/13/20 0656  PROCALCITON  --  0.37  LATICACIDVEN 1.3  --     Recent Results (from the past 240 hour(s))  Blood culture (routine x 2)     Status: None   Collection Time: 03/08/20  7:26 PM   Specimen: BLOOD  Result Value Ref Range Status   Specimen Description BLOOD RIGHT ANTECUBITAL  Final   Special Requests   Final    BOTTLES DRAWN AEROBIC AND ANAEROBIC Blood Culture results may not be optimal due to an excessive volume of blood received in culture bottles   Culture   Final    NO GROWTH 5 DAYS Performed at [facility], [address], [place], [place] [address]    Report Status 03/13/2020 FINAL  Final  Blood culture (routine x 2)     Status: None   Collection Time: 03/09/20 12:55 AM   Specimen: BLOOD  Result Value Ref Range Status   Specimen Description BLOOD LEFT [facility]  Final   Special Requests   Final     BOTTLES DRAWN AEROBIC AND ANAEROBIC Blood Culture adequate volume   Culture   Final    NO GROWTH 5 DAYS Performed at [facility], [address], [place], [place] [address]    Report Status 03/14/2020 FINAL  Final  SARS Coronavirus 2 by RT PCR (hospital order, performed in [facility] hospital lab) Nasopharyngeal Nasopharyngeal Swab     Status: None   Collection Time: 03/09/20 12:55 AM   Specimen: Nasopharyngeal Swab  Result Value Ref Range Status   SARS Coronavirus 2 NEGATIVE NEGATIVE Final    Comment: (NOTE) SARS-CoV-2 target nucleic acids are NOT DETECTED.  The SARS-CoV-2 RNA is generally detectable in upper and lower respiratory specimens during the acute phase of infection. The lowest concentration of SARS-CoV-2 viral copies this assay can detect is 250 copies / mL. A negative result does not preclude SARS-CoV-2 infection and should not be used as the sole basis for treatment or other patient management decisions.  A negative result may occur with improper specimen collection / handling, submission of specimen other than nasopharyngeal swab, presence of viral mutation(s) within the areas targeted by this assay, and inadequate number of viral copies (<250 copies / mL). A negative result must be combined with clinical observations, patient history, and epidemiological information.  Fact Sheet for Patients:   StrictlyIdeas.no  Fact Sheet for Healthcare Providers: BankingDealers.co.za  This test is not yet approved or  cleared by the [place] FDA and has been authorized for detection and/or diagnosis of SARS-CoV-2 by FDA under an Emergency Use Authorization (EUA).  This EUA will remain in effect (meaning this test can be used) for the duration of the COVID-19 declaration under Section 564(b)(1) of the Act, 21 U.S.C. section 360bbb-3(b)(1), unless the authorization is terminated or revoked sooner.  Performed at  [facility], [address], [place],  [address]   Aerobic/Anaerobic Culture (surgical/deep wound)     Status: None   Collection Time: 03/09/20  5:28 PM   Specimen: Wound; Abscess  Result Value Ref Range Status   Specimen Description WOUND RIGHT FOOT  Final   Special Requests NONE  Final   Gram Stain   Final    RARE WBC PRESENT, PREDOMINANTLY PMN NO ORGANISMS SEEN    Culture   Final    FEW METHICILLIN RESISTANT STAPHYLOCOCCUS AUREUS NO ANAEROBES ISOLATED Performed at [facility], [place] [address] [place], [place] [address]    Report Status 03/14/2020 FINAL  Final   Organism ID, Bacteria METHICILLIN RESISTANT STAPHYLOCOCCUS AUREUS  Final      Susceptibility   Methicillin resistant staphylococcus aureus - MIC*    CIPROFLOXACIN >=8 RESISTANT Resistant     ERYTHROMYCIN >=8 RESISTANT Resistant     GENTAMICIN <=0.5 SENSITIVE Sensitive     OXACILLIN >=4 RESISTANT Resistant     TETRACYCLINE <=1 SENSITIVE Sensitive     VANCOMYCIN <=0.5 SENSITIVE Sensitive     TRIMETH/SULFA 80 RESISTANT Resistant     CLINDAMYCIN <=0.25 SENSITIVE Sensitive     RIFAMPIN <=0.5 SENSITIVE Sensitive     Inducible Clindamycin NEGATIVE Sensitive     * FEW METHICILLIN RESISTANT STAPHYLOCOCCUS AUREUS  Aerobic/Anaerobic Culture (surgical/deep wound)     Status: None (Preliminary result)   Collection Time: 03/10/20  8:03 PM   Specimen: PATH Other; Wound  Result Value Ref Range Status   Specimen Description   Final    ABSCESS Performed at [facility], [address], [place], [place] [address]    Special Requests DEEP  Final   Gram Stain   Final    FEW WBC PRESENT, PREDOMINANTLY PMN FEW GRAM POSITIVE COCCI IN PAIRS IN CLUSTERS Performed at [facility], [place] [address], [place], [place] [address]    Culture   Final    ABUNDANT METHICILLIN RESISTANT STAPHYLOCOCCUS AUREUS NO ANAEROBES ISOLATED; CULTURE IN PROGRESS FOR 5 DAYS    Report Status PENDING  Incomplete    Organism ID, Bacteria METHICILLIN RESISTANT STAPHYLOCOCCUS AUREUS  Final      Susceptibility   Methicillin resistant staphylococcus aureus - MIC*    CIPROFLOXACIN >=8 RESISTANT Resistant     ERYTHROMYCIN >=8 RESISTANT Resistant     GENTAMICIN <=0.5 SENSITIVE Sensitive     OXACILLIN >=4 RESISTANT Resistant     TETRACYCLINE <=1 SENSITIVE Sensitive     VANCOMYCIN <=0.5 SENSITIVE Sensitive     TRIMETH/SULFA 160 RESISTANT Resistant     CLINDAMYCIN <=0.25 SENSITIVE Sensitive     RIFAMPIN <=0.5 SENSITIVE Sensitive     Inducible Clindamycin NEGATIVE Sensitive     * ABUNDANT METHICILLIN RESISTANT STAPHYLOCOCCUS AUREUS  MRSA PCR Screening     Status: None   Collection Time: 03/14/20  1:36 PM   Specimen: Nasopharyngeal  Result Value Ref Range Status   MRSA by PCR NEGATIVE NEGATIVE Final    Comment:        The GeneXpert MRSA Assay (FDA approved for NASAL specimens only), is one component of a comprehensive MRSA colonization surveillance program. It is not intended to diagnose MRSA infection nor to guide or monitor treatment for MRSA infections. Performed at [facility], [address], [place],  [address]          Radiology Studies: DG Chest 1 View  Result Date: 03/14/2020 CLINICAL DATA:  Shortness of breath. EXAM: CHEST  1 VIEW COMPARISON:  March 12, 2020 FINDINGS: Decreased lung volumes are again seen which is likely, in part, secondary to the degree of patient inspiration. Very mildly increased bronchovascular lung markings are noted without visualization of the patchy bilateral airspace opacities seen on the prior study. There is  no evidence of a pleural effusion or pneumothorax. The heart size and mediastinal contours are within normal limits. The visualized skeletal structures are unremarkable. IMPRESSION: Very mildly increased bronchovascular lung markings without visualization of the patchy bilateral airspace opacities seen on the prior chest plain film.  Electronically Signed   By: Virgina Norfolk M.D.   On: 03/14/2020 03:11   PERIPHERAL VASCULAR CATHETERIZATION  Result Date: 03/13/2020 See op note       Scheduled Meds: . aspirin EC  81 mg Oral Daily  . atorvastatin  40 mg Oral QHS  . calcium-vitamin D  1 tablet Oral Q breakfast  . cholecalciferol  1,000 Units Oral Daily  . clopidogrel  75 mg Oral Q breakfast  . DULoxetine  30 mg Oral BID  . enoxaparin (LOVENOX) injection  40 mg Subcutaneous Q24H  . insulin aspart  0-5 Units Subcutaneous QHS  . insulin aspart  0-9 Units Subcutaneous TID WC  . insulin detemir  10 Units Subcutaneous QHS  . linagliptin  5 mg Oral Daily  . lisinopril  2.5 mg Oral Daily  . pantoprazole  40 mg Oral Daily  . sodium chloride flush  3 mL Intravenous Q12H  . terbinafine   Topical BID  . vitamin B-12  500 mcg Oral Daily  . zonisamide  100 mg Oral Daily   Continuous Infusions: . sodium chloride    . azithromycin 500 mg (03/13/20 1736)  . cefTRIAXone (ROCEPHIN)  IV 2 g (03/13/20 2212)  . vancomycin 1,500 mg (03/14/20 0113)    Assessment & Plan:   Active Problems:   Lower extremity cellulitis  Sepsis 2/2Right lower extremity/mainly foot severe cellulitis septic arthritis of the fifth MTP joint with osteomyelitis including the fifth metatarsal head and status proximal phalanx along with multiple abscesses in the setting of uncontrolled type 2 diabetes mellitus and diabetic foot.=-  Vascular consulted  s/p RLE angiogram with successful revascularization - POD#1..>on asa/plavix.f/u as outpt Podiatry following..>plan for repeat I/D of Rt foot.for chance for limb salvage. Surgery in am.  Npo after MN. Will consult ID for rec. For abx therapy and duration.  Abscess cx-MRSA, on vancomycin Wound cx MRSA   Confusion/Encephalopathy- unable to assess her today as she was sleepy and refused to participate. In setting of /multifocal pneumonia and worsening respiratory status Afebrile, WBC  improving Continue with IV ceftriaxone and azithromycin If no improvement will consult neurology   Uncontrolled Hyperglycemia in the setting of Type 2 Diabetes Mellitus -Patient hemoglobin A1c was 9.7 Continue with RISS Ck fs Continue with Trajenta  EssentialHypertension. -Continue with Lisinopril 2.5 mg p.o. daily  Hyperlipidemia -Continue withAtorvastatin 40 g p.o. nightly  GERD. -PPI therapy will be resumed. May need to be changed to IV  Hyponatremia/Hypochloremia -Mild patient's sodium is now 133 today -Patient's chloride level has gone from 93 -> 96 -> 100 -> 102 -IV fluid hydration is now stopped -Continue monitor and trend and repeat CMP in a.m.  Loose Stools -In the setting of Antibiotics, and now improving -WBC is elevated but likely from Severe Cellulitis -Continue to Monitor and if worsens consider checking C Difficile and GI Pathogen Panel -Hold all Laxatives and if necessary Hold PPI  Hypokalemia -Patient's potassium this morning was 4.1 -CheckMagnesiumLeveland was 1.8 -Continue to Monitor and Replete as Necessary -Repeat CMP in the AM  Thrombocytosis -Patient's platelet count on admission was 570 and went to 532 -> 454 -> 530 -> 567 -> 637 -Likely isReactive in the setting ofInfection and question if this is  also antibiotic mated -Continue monitor and trend andRepeat CBC in a.m.  Mild AKI Metabolic acidosis, mild -Patient's BUNs/creatinine on admission was 18/1.17 and improving and is now 19/0.93 -Patient's CO2 is now 22, anion gap is 10, chloride level is 101 -Baseline appears to be from 0.6-0.8 -Continue with IV fluid hydration as above and reduce rate from 100 mL/hr to 75 mL/hr -Avoid further nephrotoxic medications, contrast dyes, and hypotension and renally adjust medications  -Repeat CBC in a.m.  NormocyticAnemia -Patient's hemoglobin/hematocrit admission was 10.5/30.8 and i dropped to 7.6/22.9 likely in the setting of a  dilutional drop from IV fluids H/h stable Continue to monitor No signs/sx of bleed   DiabeticPeripheralNeuropathy. -Continue with duloxetine    History ofSeizures -Continue with zonisamide  Has had childhood febrile seizure and questionable if eclampsia   Hypomagnesemia -stable. Magnesium 1.6 Will give magnesium iv 2gm today  DVT prophylaxis: Lovenox Code Status: Full  family Communication: None at bedside  Status is: Inpatient  Remains inpatient appropriate because:Ongoing diagnostic testing needed not appropriate for outpatient work up   Dispo: The patient is from: Home              Anticipated d/c is to: TBD              Anticipated d/c date is: > 3 days              Patient currently is not medically stable to d/c.Requiring surgery in am. ID consulted.             LOS: 5 days   Time spent: 45 min with >50% on coc    Nolberto Hanlon, MD [facility] Pager [phone]  If 7PM-7AM, please contact night-coverage [email or web address] Password [facility] 03/14/2020, 4:21 PM

## 2020-03-14 NOTE — H&P (View-Only) (Signed)
1 Day Post-Op   Subjective/Chief Complaint: Patient seen.  States she is still having some significant pain with the foot.   Objective: Vital signs in last 24 hours: Temp:  [97.9 F (36.6 C)-98.7 F (37.1 C)] 97.9 F (36.6 C) (09/15 0741) Pulse Rate:  [89-107] 97 (09/15 0741) Resp:  [16-22] 18 (09/15 0741) BP: (104-142)/(58-97) 131/72 (09/15 0741) SpO2:  [90 %-98 %] 97 % (09/15 0741) Weight:  [75.8 kg] 75.8 kg (09/14 0935) Last BM Date: 03/13/20  Intake/Output from previous day: 09/14 0701 - 09/15 0700 In: 363 [IV Piggyback:363] Out: 1400 [Urine:1400] Intake/Output this shift: No intake/output data recorded.  The bandages dry and intact on the right foot.  Upon removal there is still some moderate to heavy drainage on the bandaging.  Still some expressible purulence and drainage.  Erythema dorsally appears to be improving with still some moderate erythema in the arch area.  White blood cell count is slowly trending downward.  Lab Results:  Recent Labs    03/13/20 0656 03/14/20 0430  WBC 19.5* 17.5*  HGB 8.3* 8.0*  HCT 25.0* 24.1*  PLT 637* 673*   BMET Recent Labs    03/13/20 0656 03/14/20 0430  NA 133* 134*  K 4.1 3.9  CL 101 104  CO2 22 19*  GLUCOSE 147* 134*  BUN 18 13  CREATININE 1.01* 0.97  CALCIUM 8.1* 8.1*   PT/INR No results for input(s): LABPROT, INR in the last 72 hours. ABG Recent Labs    03/12/20 1030  PHART 7.43  HCO3 18.6*    Studies/Results: DG Chest 1 View  Result Date: 03/14/2020 CLINICAL DATA:  Shortness of breath. EXAM: CHEST  1 VIEW COMPARISON:  March 12, 2020 FINDINGS: Decreased lung volumes are again seen which is likely, in part, secondary to the degree of patient inspiration. Very mildly increased bronchovascular lung markings are noted without visualization of the patchy bilateral airspace opacities seen on the prior study. There is no evidence of a pleural effusion or pneumothorax. The heart size and mediastinal contours  are within normal limits. The visualized skeletal structures are unremarkable. IMPRESSION: Very mildly increased bronchovascular lung markings without visualization of the patchy bilateral airspace opacities seen on the prior chest plain film. Electronically Signed   By: Virgina Norfolk M.D.   On: 03/14/2020 03:11   CT HEAD WO CONTRAST  Result Date: 03/12/2020 CLINICAL DATA:  Delirium, new from baseline. Recent amputation of toe. EXAM: CT HEAD WITHOUT CONTRAST TECHNIQUE: Contiguous axial images were obtained from the base of the skull through the vertex without intravenous contrast. COMPARISON:  CT head without contrast 02/19/2020 FINDINGS: Brain: No acute infarct, hemorrhage, or mass lesion is present. No significant white matter lesions are present. The ventricles are of normal size. No significant extraaxial fluid collection is present. The brainstem and cerebellum are within normal limits. Vascular: No hyperdense vessel or unexpected calcification. Skull: Calvarium is intact. No focal lytic or blastic lesions are present. No significant extracranial soft tissue lesion is present. Sinuses/Orbits: The paranasal sinuses and mastoid air cells are clear. The globes and orbits are within normal limits. IMPRESSION: Negative CT of the head. Electronically Signed   By: San Morelle M.D.   On: 03/12/2020 11:41   CT CHEST WO CONTRAST  Result Date: 03/12/2020 CLINICAL DATA:  Respiratory failure. EXAM: CT CHEST WITHOUT CONTRAST TECHNIQUE: Multidetector CT imaging of the chest was performed following the standard protocol without IV contrast. COMPARISON:  None. FINDINGS: Cardiovascular: No evidence of thoracic aortic aneurysm is noted.  Minimal pericardial effusion is noted. Coronary artery calcifications are noted. Normal cardiac size. Mediastinum/Nodes: No enlarged mediastinal or axillary lymph nodes. Thyroid gland, trachea, and esophagus demonstrate no significant findings. Lungs/Pleura: No pneumothorax is  noted. No pleural effusion is noted. Patchy airspace opacities are noted posteriorly in the upper and lower lobes bilaterally consistent with multifocal pneumonia. Upper Abdomen: No acute abnormality. Musculoskeletal: No chest wall mass or suspicious bone lesions identified. IMPRESSION: 1. Patchy airspace opacities are noted posteriorly in the upper and lower lobes bilaterally consistent with multifocal pneumonia. 2. Coronary artery calcifications are noted suggesting coronary artery disease. 3. Minimal pericardial effusion is noted. Electronically Signed   By: Marijo Conception M.D.   On: 03/12/2020 17:52   PERIPHERAL VASCULAR CATHETERIZATION  Result Date: 03/13/2020 See op note   Anti-infectives: Anti-infectives (From admission, onward)   Start     Dose/Rate Route Frequency Ordered Stop   03/13/20 0200  vancomycin (VANCOREADY) IVPB 1500 mg/300 mL        1,500 mg 150 mL/hr over 120 Minutes Intravenous Every 24 hours 03/12/20 0915     03/12/20 2200  cefTRIAXone (ROCEPHIN) 2 g in sodium chloride 0.9 % 100 mL IVPB        2 g 200 mL/hr over 30 Minutes Intravenous Every 24 hours 03/12/20 1535     03/12/20 1800  azithromycin (ZITHROMAX) 500 mg in sodium chloride 0.9 % 250 mL IVPB        500 mg 250 mL/hr over 60 Minutes Intravenous Every 24 hours 03/12/20 1545     03/12/20 0440  vancomycin variable dose per unstable renal function (pharmacist dosing)  Status:  Discontinued         Does not apply See admin instructions 03/12/20 0440 03/12/20 0915   03/10/20 2018  vancomycin (VANCOCIN) powder  Status:  Discontinued          As needed 03/10/20 2018 03/10/20 2042   03/09/20 1400  vancomycin (VANCOCIN) IVPB 1000 mg/200 mL premix  Status:  Discontinued        1,000 mg 200 mL/hr over 60 Minutes Intravenous Every 12 hours 03/09/20 0301 03/12/20 0440   03/09/20 0900  ceFEPIme (MAXIPIME) 2 g in sodium chloride 0.9 % 100 mL IVPB  Status:  Discontinued        2 g 200 mL/hr over 30 Minutes Intravenous Every 8  hours 03/09/20 0144 03/12/20 1535   03/09/20 0200  vancomycin (VANCOREADY) IVPB 500 mg/100 mL        500 mg 100 mL/hr over 60 Minutes Intravenous  Once 03/09/20 0149 03/09/20 0437   03/09/20 0145  vancomycin (VANCOCIN) IVPB 1000 mg/200 mL premix  Status:  Discontinued        1,000 mg 200 mL/hr over 60 Minutes Intravenous  Once 03/09/20 0144 03/09/20 0149   03/09/20 0030  ceFEPIme (MAXIPIME) 2 g in sodium chloride 0.9 % 100 mL IVPB        2 g 200 mL/hr over 30 Minutes Intravenous  Once 03/09/20 0019 03/09/20 0133   03/09/20 0030  metroNIDAZOLE (FLAGYL) IVPB 500 mg        500 mg 100 mL/hr over 60 Minutes Intravenous  Once 03/09/20 0019 03/09/20 0214   03/09/20 0030  vancomycin (VANCOCIN) IVPB 1000 mg/200 mL premix        1,000 mg 200 mL/hr over 60 Minutes Intravenous  Once 03/09/20 0019 03/09/20 0332      Assessment/Plan: s/p Procedure(s): Lower Extremity Angiography (Right) Assessment: Stable but guarded condition status post  I&D.   Plan: Betadine and a sterile bandage reapplied to all areas on the right foot.  Discussed with the patient that due to the continued infection I think we need to repeat the I&D on her right foot to give her the best chance for limb salvage.  Again discussed possible risks and complications of the procedure and anesthesia including but not limited to inability to heal due to her diabetes or extent of infection with still significant risk for amputation of the right lower extremity.  Questions invited and answered.  At this point it appears our best chance for getting in the operating room will be tomorrow morning.  Plan for surgery tomorrow morning.  Consent form for I&D multiple sites right foot.  N.p.o. after midnight.  LOS: 5 days    Durward Fortes 03/14/2020

## 2020-03-14 NOTE — Progress Notes (Signed)
[facility] Daily Progress Note  Subjective: 03/13/20: 1. Introduction catheter into right lower extremity 3rd order catheter placement  2. Contrast injection right lower extremity for distal runoff with additional 3rd order  3. Percutaneous transluminal angioplasty of the right tibioperoneal trunk and peroneal 2 4 mm using a L[employer] drug-eluting balloon              4. Star close closure left common femoral arteriotomy  Patient sitting in bed with husband at bedside. No complaints.   Objective: Vitals:   03/13/20 2116 03/14/20 0111 03/14/20 0731 03/14/20 0741  BP: 121/65 125/65  131/72  Pulse: (!) 103 (!) 107  97  Resp: 16 17  18   Temp: 98.2 F (36.8 C) 98.7 F (37.1 C)  97.9 F (36.6 C)  TempSrc: Oral Oral  Oral  SpO2: 95% 96% 97% 97%  Weight:      Height:        Intake/Output Summary (Last 24 hours) at 03/14/2020 1505 Last data filed at 03/14/2020 1437 Gross per 24 hour  Intake 363.01 ml  Output 2400 ml  Net -2036.99 ml   Physical Exam: A&Ox3, NAD CV: RRR Pulmonary: CTA Bilaterally Abdomen: Soft, Nontender, Nondistended Left Groin:  Access Site: clean and dry. No swelling / drainage.  Vascular:  Right Lower Extremity: thigh soft, calf soft. Podiatry dressing intact, clean and dry.    Laboratory: CBC    Component Value Date/Time   WBC 17.5 (H) 03/14/2020 0430   HGB 8.0 (L) 03/14/2020 0430   HGB 11.6 (L) 10/23/2012 1807   HCT 24.1 (L) 03/14/2020 0430   HCT 34.0 (L) 10/23/2012 1807   PLT 673 (H) 03/14/2020 0430   PLT 367 10/23/2012 1807   BMET    Component Value Date/Time   NA 134 (L) 03/14/2020 0430   NA 134 (L) 10/23/2012 1807   K 3.9 03/14/2020 0430   K 4.2 10/23/2012 1807   CL 104 03/14/2020 0430   CL 103 10/23/2012 1807   CO2 19 (L) 03/14/2020 0430   CO2 23 10/23/2012 1807   GLUCOSE 134 (H) 03/14/2020 0430   GLUCOSE 238 (H) 10/23/2012 1807   BUN 13 03/14/2020 0430   BUN 8  10/23/2012 1807   CREATININE 0.97 03/14/2020 0430   CREATININE 0.59 10/25/2012 1207   CALCIUM 8.1 (L) 03/14/2020 0430   CALCIUM 8.6 10/23/2012 1807   GFRNONAA >60 03/14/2020 0430   GFRNONAA >60 10/23/2012 1807   GFRAA >60 03/14/2020 0430   GFRAA >60 10/23/2012 1807   Assessment/Planning: The patient is a 44 year old female s/p RLE angiogram with successful revascularization - POD#1  1) Access site, clean and dry 2) ASA / Plavix for medical management  3) Will continue to follow in the outpatient setting.   Discussed with Dr. Eber Hong Hailynn Slovacek PA-C 03/14/2020 3:05 PM

## 2020-03-14 NOTE — Anesthesia Preprocedure Evaluation (Addendum)
Anesthesia Evaluation  Patient identified by MRN, date of birth, ID band Patient awake    Reviewed: Allergy & Precautions, H&P , NPO status , Patient's Chart, lab work & pertinent test results, reviewed documented beta blocker date and time   Airway Mallampati: II  TM Distance: >3 FB Neck ROM: full    Dental  (+) Teeth Intact   Pulmonary neg pulmonary ROS, Current Smoker,    Pulmonary exam normal        Cardiovascular Exercise Tolerance: Poor + Peripheral Vascular Disease  Normal cardiovascular exam Rate:Normal     Neuro/Psych  Headaches, Seizures -,  negative psych ROS   GI/Hepatic Neg liver ROS, GERD  Medicated,  Endo/Other  negative endocrine ROSdiabetes  Renal/GU negative Renal ROS  negative genitourinary   Musculoskeletal   Abdominal   Peds  Hematology negative hematology ROS (+)   Anesthesia Other Findings Past Medical History: No date: Diabetes mellitus without complication ([place]) 0258 Last sz: Seizures ([place])     Comment:  childhood febrile sz, then ?eclampsia   Reproductive/Obstetrics negative OB ROS                            Anesthesia Physical Anesthesia Plan  ASA: III  Anesthesia Plan: General LMA   Post-op Pain Management:    Induction:   PONV Risk Score and Plan:   Airway Management Planned:   Additional Equipment:   Intra-op Plan:   Post-operative Plan:   Informed Consent: I have reviewed the patients History and Physical, chart, labs and discussed the procedure including the risks, benefits and alternatives for the proposed anesthesia with the patient or authorized representative who has indicated his/her understanding and acceptance.       Plan Discussed with: CRNA  Anesthesia Plan Comments:         Anesthesia Quick Evaluation

## 2020-03-14 NOTE — Progress Notes (Signed)
Pt scored a 6 on the RT assessment. She takes albuterol prn at home. Her HR is increased ane her BS are clear. The nebulizers are changed to prn. The pt and her family member are aware.

## 2020-03-15 ENCOUNTER — Inpatient Hospital Stay: Payer: Medicare Other | Admitting: Anesthesiology

## 2020-03-15 ENCOUNTER — Encounter
Admission: EM | Disposition: A | Discharge status: Home / Self Care | Payer: Self-pay | Source: Home / Self Care | Attending: Internal Medicine

## 2020-03-15 ENCOUNTER — Encounter: Payer: Self-pay | Admitting: Family Medicine

## 2020-03-15 DIAGNOSIS — D473 Essential (hemorrhagic) thrombocythemia: Secondary | ICD-10-CM

## 2020-03-15 DIAGNOSIS — Z87898 Personal history of other specified conditions: Secondary | ICD-10-CM

## 2020-03-15 DIAGNOSIS — G629 Polyneuropathy, unspecified: Secondary | ICD-10-CM

## 2020-03-15 HISTORY — PX: IRRIGATION AND DEBRIDEMENT FOOT: SHX6602

## 2020-03-15 LAB — GLUCOSE, CAPILLARY
Glucose-Capillary: 132 mg/dL — ABNORMAL HIGH (ref 70–99)
Glucose-Capillary: 117 mg/dL — ABNORMAL HIGH (ref 70–99)
Glucose-Capillary: 217 mg/dL — ABNORMAL HIGH (ref 70–99)
Glucose-Capillary: 209 mg/dL — ABNORMAL HIGH (ref 70–99)
Glucose-Capillary: 170 mg/dL — ABNORMAL HIGH (ref 70–99)

## 2020-03-15 LAB — CBC
HCT: 25.1 % — ABNORMAL LOW (ref 36.0–46.0)
Hemoglobin: 8.1 g/dL — ABNORMAL LOW (ref 12.0–15.0)
MCH: 27.6 pg (ref 26.0–34.0)
MCHC: 32.3 g/dL (ref 30.0–36.0)
MCV: 85.4 fL (ref 80.0–100.0)
Platelets: 701 K/uL — ABNORMAL HIGH (ref 150–400)
RBC: 2.94 MIL/uL — ABNORMAL LOW (ref 3.87–5.11)
RDW: 13.1 % (ref 11.5–15.5)
WBC: 16.8 K/uL — ABNORMAL HIGH (ref 4.0–10.5)
nRBC: 0.2 % (ref 0.0–0.2)

## 2020-03-15 SURGERY — IRRIGATION AND DEBRIDEMENT FOOT
Anesthesia: General | Laterality: Right

## 2020-03-15 MED ORDER — FENTANYL CITRATE (PF) 100 MCG/2ML IJ SOLN
INTRAMUSCULAR | Status: AC
  Administered 2020-03-15: 25 ug via INTRAVENOUS
  Filled 2020-03-15: qty 2

## 2020-03-15 MED ORDER — LINEZOLID 600 MG/300ML IV SOLN
600.0000 mg | Freq: Two times a day (BID) | INTRAVENOUS | Status: DC
  Administered 2020-03-15 – 2020-03-16 (×4): 600 mg via INTRAVENOUS
  Filled 2020-03-15 (×7): qty 300

## 2020-03-15 MED ORDER — LIDOCAINE HCL (CARDIAC) PF 100 MG/5ML IV SOSY
PREFILLED_SYRINGE | INTRAVENOUS | Status: DC | PRN
  Administered 2020-03-15: 60 mg via INTRAVENOUS

## 2020-03-15 MED ORDER — MIDAZOLAM HCL 2 MG/2ML IJ SOLN
INTRAMUSCULAR | Status: AC
  Filled 2020-03-15: qty 2

## 2020-03-15 MED ORDER — VANCOMYCIN HCL 1000 MG IV SOLR
INTRAVENOUS | Status: DC | PRN
  Administered 2020-03-15: 1000 mg

## 2020-03-15 MED ORDER — DULOXETINE HCL 30 MG PO CPEP: 30.0000 mg | ORAL_CAPSULE | Freq: Every day | ORAL | Status: DC

## 2020-03-15 MED ORDER — MIDAZOLAM HCL 2 MG/2ML IJ SOLN
INTRAMUSCULAR | Status: DC | PRN
  Administered 2020-03-15: 2 mg via INTRAVENOUS

## 2020-03-15 MED ORDER — SODIUM CHLORIDE 0.9 % IR SOLN
Status: DC | PRN
  Administered 2020-03-15: 500 mL

## 2020-03-15 MED ORDER — ONDANSETRON HCL 4 MG/2ML IJ SOLN
INTRAMUSCULAR | Status: DC | PRN
  Administered 2020-03-15: 4 mg via INTRAVENOUS

## 2020-03-15 MED ORDER — FENTANYL CITRATE (PF) 100 MCG/2ML IJ SOLN
INTRAMUSCULAR | Status: AC
  Filled 2020-03-15: qty 2

## 2020-03-15 MED ORDER — ONDANSETRON HCL 4 MG/2ML IJ SOLN: 4.0000 mg | Freq: Once | INTRAMUSCULAR | Status: DC | PRN

## 2020-03-15 MED ORDER — ONDANSETRON HCL 4 MG/2ML IJ SOLN
INTRAMUSCULAR | Status: AC
  Filled 2020-03-15: qty 2

## 2020-03-15 MED ORDER — FENTANYL CITRATE (PF) 100 MCG/2ML IJ SOLN
25.0000 ug | INTRAMUSCULAR | Status: AC | PRN
  Administered 2020-03-15 (×6): 25 ug via INTRAVENOUS

## 2020-03-15 MED ORDER — PROPOFOL 10 MG/ML IV BOLUS
INTRAVENOUS | Status: DC | PRN
  Administered 2020-03-15: 150 mg via INTRAVENOUS

## 2020-03-15 MED ORDER — PROPOFOL 10 MG/ML IV BOLUS
INTRAVENOUS | Status: AC
  Filled 2020-03-15: qty 20

## 2020-03-15 MED ORDER — LIDOCAINE HCL (PF) 2 % IJ SOLN
INTRAMUSCULAR | Status: AC
  Filled 2020-03-15: qty 5

## 2020-03-15 MED ORDER — EPHEDRINE SULFATE 50 MG/ML IJ SOLN
INTRAMUSCULAR | Status: DC | PRN
  Administered 2020-03-15 (×3): 10 mg via INTRAVENOUS

## 2020-03-15 MED ORDER — FENTANYL CITRATE (PF) 100 MCG/2ML IJ SOLN
INTRAMUSCULAR | Status: DC | PRN
Start: 2020-03-15 — End: 2020-03-15
  Administered 2020-03-15 (×2): 25 ug via INTRAVENOUS
  Administered 2020-03-15: 50 ug via INTRAVENOUS

## 2020-03-15 MED ORDER — BUPIVACAINE HCL (PF) 0.5 % IJ SOLN
INTRAMUSCULAR | Status: DC | PRN
  Administered 2020-03-15: 18 mL

## 2020-03-15 SURGICAL SUPPLY — 58 items
"PENCIL ELECTRO HAND CTR " (MISCELLANEOUS) ×1 IMPLANT
BLADE OSCILLATING/SAGITTAL (BLADE)
BLADE SURG 15 STRL LF DISP TIS (BLADE) ×1 IMPLANT
BLADE SURG 15 STRL SS (BLADE) ×1
BLADE SURG MINI STRL (BLADE) IMPLANT
BLADE SW THK.38XMED LNG THN (BLADE) IMPLANT
BNDG CONFORM 2 STRL LF (GAUZE/BANDAGES/DRESSINGS) ×1 IMPLANT
BNDG ELASTIC 4X5.8 VLCR NS LF (GAUZE/BANDAGES/DRESSINGS) ×2 IMPLANT
BNDG ESMARK 4X12 TAN STRL LF (GAUZE/BANDAGES/DRESSINGS) ×1 IMPLANT
BNDG GAUZE 4.5X4.1 6PLY STRL (MISCELLANEOUS) ×1 IMPLANT
CANISTER SUCT 1200ML W/VALVE (MISCELLANEOUS) ×2 IMPLANT
COVER WAND RF STERILE (DRAPES) ×2 IMPLANT
CUFF TOURN SGL QUICK 12 (TOURNIQUET CUFF) IMPLANT
CUFF TOURN SGL QUICK 18X4 (TOURNIQUET CUFF) ×1 IMPLANT
DRAPE FLUOR MINI C-ARM 54X84 (DRAPES) IMPLANT
DRSG MEPITEL 4X7.2 (GAUZE/BANDAGES/DRESSINGS) ×1 IMPLANT
DURAPREP 26ML APPLICATOR (WOUND CARE) ×2 IMPLANT
ELECT REM PT RETURN 9FT ADLT (ELECTROSURGICAL) ×2
ELECTRODE REM PT RTRN 9FT ADLT (ELECTROSURGICAL) ×1 IMPLANT
GAUZE SPONGE 4X4 12PLY STRL (GAUZE/BANDAGES/DRESSINGS) ×3 IMPLANT
GAUZE SPONGE 4X4 16PLY XRAY LF (GAUZE/BANDAGES/DRESSINGS) ×1 IMPLANT
GAUZE XEROFORM 1X8 LF (GAUZE/BANDAGES/DRESSINGS) ×2 IMPLANT
GLOVE BIO SURGEON STRL SZ7.5 (GLOVE) ×2 IMPLANT
GLOVE INDICATOR 8.0 STRL GRN (GLOVE) ×2 IMPLANT
GOWN STRL REUS W/ TWL LRG LVL3 (GOWN DISPOSABLE) ×2 IMPLANT
GOWN STRL REUS W/TWL LRG LVL3 (GOWN DISPOSABLE) ×2
HANDPIECE VERSAJET DEBRIDEMENT (MISCELLANEOUS) ×2 IMPLANT
KIT STIMULAN RAPID CURE 5CC (Orthopedic Implant) ×1 IMPLANT
KIT TURNOVER KIT A (KITS) ×2 IMPLANT
LABEL OR SOLS (LABEL) ×2 IMPLANT
NDL FILTER BLUNT 18X1 1/2 (NEEDLE) ×1 IMPLANT
NDL HYPO 25X1 1.5 SAFETY (NEEDLE) ×3 IMPLANT
NEEDLE FILTER BLUNT 18X 1/2SAF (NEEDLE) ×1
NEEDLE FILTER BLUNT 18X1 1/2 (NEEDLE) ×1 IMPLANT
NEEDLE HYPO 25X1 1.5 SAFETY (NEEDLE) ×6 IMPLANT
NS IRRIG 500ML POUR BTL (IV SOLUTION) ×2 IMPLANT
PACK EXTREMITY (MISCELLANEOUS) ×2 IMPLANT
PAD ABD DERMACEA PRESS 5X9 (GAUZE/BANDAGES/DRESSINGS) ×3 IMPLANT
PENCIL ELECTRO HAND CTR (MISCELLANEOUS) ×2 IMPLANT
PULSAVAC PLUS IRRIG FAN TIP (DISPOSABLE) ×2
RASP SM TEAR CROSS CUT (RASP) IMPLANT
SOL .9 NS 3000ML IRR  AL (IV SOLUTION) ×1
SOL .9 NS 3000ML IRR UROMATIC (IV SOLUTION) IMPLANT
SOL PREP PVP 2OZ (MISCELLANEOUS) ×2
SOLUTION PREP PVP 2OZ (MISCELLANEOUS) ×1 IMPLANT
STOCKINETTE STRL 6IN 960660 (GAUZE/BANDAGES/DRESSINGS) ×2 IMPLANT
SUT ETHILON 3-0 FS-10 30 BLK (SUTURE) ×4
SUT ETHILON 4-0 (SUTURE) ×1
SUT ETHILON 4-0 FS2 18XMFL BLK (SUTURE) ×1
SUT VIC AB 3-0 SH 27 (SUTURE) ×1
SUT VIC AB 3-0 SH 27X BRD (SUTURE) ×1 IMPLANT
SUT VIC AB 4-0 FS2 27 (SUTURE) ×2 IMPLANT
SUTURE EHLN 3-0 FS-10 30 BLK (SUTURE) ×1 IMPLANT
SUTURE ETHLN 4-0 FS2 18XMF BLK (SUTURE) ×1 IMPLANT
SYR 10ML LL (SYRINGE) ×2 IMPLANT
SYR 3ML LL SCALE MARK (SYRINGE) ×2 IMPLANT
SYR BULB IRRIG 60ML STRL (SYRINGE) ×1 IMPLANT
TIP FAN IRRIG PULSAVAC PLUS (DISPOSABLE) IMPLANT

## 2020-03-15 NOTE — Anesthesia Procedure Notes (Signed)
Procedure Name: LMA Insertion Date/Time: 03/15/2020 7:51 AM Performed by: Caryl Asp, CRNA Pre-anesthesia Checklist: Patient identified, Patient being monitored, Timeout performed, Emergency Drugs available and Suction available Patient Re-evaluated:Patient Re-evaluated prior to induction Oxygen Delivery Method: Circle system utilized Preoxygenation: Pre-oxygenation with 100% oxygen Induction Type: IV induction Ventilation: Mask ventilation without difficulty LMA: LMA inserted LMA Size: 3.0 Tube type: Oral Number of attempts: 1 Placement Confirmation: positive ETCO2 and breath sounds checked- equal and bilateral Tube secured with: Tape Dental Injury: Teeth and Oropharynx as per pre-operative assessment

## 2020-03-15 NOTE — Progress Notes (Signed)
   03/15/20 1500  Assess: if the MEWS score is Yellow or Red  Were vital signs taken at a resting state? Yes  Focused Assessment No change from prior assessment  Early Detection of Sepsis Score *See Row Information* Low  MEWS guidelines implemented *See Row Information* Yes  Take Vital Signs  Increase Vital Sign Frequency  Yellow: Q 2hr X 2 then Q 4hr X 2, if remains yellow, continue Q 4hrs  Escalate  MEWS: Escalate Yellow: discuss with charge nurse/RN and consider discussing with provider and RRT  Notify: Charge Nurse/RN  Name of Charge Nurse/RN Notified Ethelda Chick RN  Date Charge Nurse/RN Notified 03/15/20  Time Charge Nurse/RN Notified 1500

## 2020-03-15 NOTE — Progress Notes (Signed)
PROGRESS NOTE    Kristy Mcmillan  GHW:[id number] DOB: 1976-06-29 DOA: 03/09/2020 [facility]: [facility]    Brief Narrative:  The patient is a 43 year old overweight Caucasian female with a past medical history significant for but not limited to is mellitus type II, history of seizure disorder who presented to the emergency room with acute onset of worsening right foot swelling associated with erythema, pain and tenderness. She had a pedicure on 02/27/2020 before symptoms and began having a fever over the last couple days associated with chills. She denies any nausea or vomiting. She is not vaccinated against COVID-19 disease and upon presentation she is found to be febrile with a tachycardia and a respiratory rate of 29. Labs revealed borderline potassium of 2.5 and she did have a hyponatremia and hypochloremia and elevated blood sugar with a hyperglycemia of 428. In the ED sepsis protocol was started and she is given acetaminophen, IV cefepime, Flagyl and IV vancomycin as well as morphine sulfate IV Zofran. Her potassium was repleted as well. She was admitted for sepsis secondary to right foot extremity cellulitis with right fifth toe necrosis in the setting of an uncontrolled diabetic foot.   **Interim History She had bedside debridement done by podiatry when they were consulted. MRI was finally done. Vascular surgery evaluated and considering an angiogram early next week.  Continues to still be septic and had a temperature, tachycardia and tachypnea overnight which is now improved.  She also started having some loose stools likely antibiotic mediated.  03/11/20 MRI done yesterday showed "Exam significantly limited by patient motion and lack of contrast. 2. 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 3. Suspect large dorsal and smaller plantar forefoot abscesses as detailed  above. 4. Diffuse myofasciitis without obvious pyomyositis."   03/12/20 She was extremely confused today and would not answer questions appropriately.  And overnight her O2 requirements worsen and she needed 5 L.  She got put on oxygen after her surgical procedure and was not weaned off of it and now on 5 L.  Chest x-ray today showed multifocal pneumonia so will obtain a CT scan.  We will also give her IV Lasix, breathing treatments and because of her confusion and altered mental status will obtain a head CT scan without contrast, and ABG, TSH, RPR, vitamin B12 level.  Because they could not consent for the angiogram today given her confusion they are going to postpone it and attempted on Tuesday or Wednesday.  03/13/20 The patient is much more awake today and more oriented but still a little slower to respond.  She underwent an angiogram of her leg today by Dr. Delana Meyer and he felt as if he had a successful recanalization of the right lower extremity for limb salvage as she had a percutaneous transluminal angioplasty of the right tibial peroneal trunk and peroneal to 4 mm using a Lutonix drug-eluting balloon.  Her WBC is improved and she was weaned off of supplemental oxygen today and her repeat chest x-ray still pending.  She is at high risk for further amputation of the right lower extremity to limit her infection and podiatry is going to consider repeating an I&D to try to flush out the areas over time.  9/16 Dr. Caryl Comes did right foot I&D  Consultants:   Podiatry, vascular  Procedures: s/p RLE angiogram with successful revascularization -on 9/14  Antimicrobials:   Vancomycin  Subjective: She is status  post OR for I&D.  She has no complaints.  Denies shortness of breath, chest pain, abdominal pain or any other complaints  Objective: Vitals:   03/15/20 1056 03/15/20 1137 03/15/20 1246 03/15/20 1449  BP: 113/76 115/74 120/79 111/69  Pulse: (!) 102 (!) 102 (!) 105 (!) 114  Resp: 17 19 17 18    Temp: 97.7 F (36.5 C) 97.7 F (36.5 C) 97.8 F (36.6 C) 98 F (36.7 C)  TempSrc: Oral Oral Axillary Oral  SpO2: 93% 94% 97% 98%  Weight:      Height:        Intake/Output Summary (Last 24 hours) at 03/15/2020 1449 Last data filed at 03/15/2020 0955 Gross per 24 hour  Intake 1000 ml  Output 805 ml  Net 195 ml   Filed Weights   03/08/20 1911 03/13/20 0935  Weight: 75.8 kg 75.8 kg    Examination: Calm, soft-spoken, NAD CTA, no wheeze rales rhonchi's Regular S1-S2 no murmurs rubs gallops Soft benign positive bowel sounds Right foot wrapped did not open, left foot no edema Mood and affect appropriate in current setting Alert and oriented x3    Data Reviewed: I have personally reviewed following labs and imaging studies  CBC: Recent Labs  Lab 03/10/20 0458 03/10/20 0458 03/11/20 0911 03/12/20 0117 03/13/20 0656 03/14/20 0430 03/15/20 0619  WBC 25.6*   < > 28.4* 24.2* 19.5* 17.5* 16.8*  NEUTROABS 20.5*  --  23.8* 18.9* 13.6* 12.0*  --   HGB 8.3*   < > 8.2* 7.6* 8.3* 8.0* 8.1*  HCT 24.3*   < > 24.9* 22.9* 25.0* 24.1* 25.1*  MCV 81.8   < > 84.7 83.6 85.0 83.7 85.4  PLT 454*   < > 530* 567* 637* 673* 701*   < > = values in this interval not displayed.   Basic Metabolic Panel: Recent Labs  Lab 03/10/20 0458 03/10/20 0458 03/11/20 0911 03/12/20 0117 03/12/20 0649 03/13/20 0656 03/14/20 0430  NA 132*   < > 133* 133* 132* 133* 134*  K 3.6   < > 4.4 4.3 4.0 4.1 3.9  CL 100   < > 102 103 102 101 104  CO2 20*   < > 20* 22 19* 22 19*  GLUCOSE 188*   < > 145* 106* 112* 147* 134*  BUN 14   < > 17 20 19 18 13   CREATININE 0.75   < > 0.96 0.99 0.93 1.01* 0.97  CALCIUM 8.0*   < > 8.0* 8.3* 8.0* 8.1* 8.1*  MG 1.5*  --  1.9 1.8  --  1.8 1.6*  PHOS 3.0  --  3.7 3.7  --  2.8 3.1   < > = values in this interval not displayed.   GFR: Estimated Creatinine Clearance: 73.7 mL/min (by C-G formula based on SCr of 0.97 mg/dL). Liver Function Tests: Recent Labs  Lab  03/10/20 0458 03/11/20 0911 03/12/20 0117 03/13/20 0656 03/14/20 0430  AST 12* 12* 11* 12* 9*  ALT 9 8 9 8 7   ALKPHOS 113 131* 161* 139* 116  BILITOT 0.9 0.7 0.7 0.4 0.5  PROT 6.3* 7.0 6.9 7.2 6.8  ALBUMIN 1.8* 1.8* 1.6* 1.7* 1.7*   No results for input(s): LIPASE, AMYLASE in the last 168 hours. Recent Labs  Lab 03/13/20 0656  AMMONIA 16   Coagulation Profile: No results for input(s): INR, PROTIME in the last 168 hours. Cardiac Enzymes: No results for input(s): CKTOTAL, CKMB, CKMBINDEX, TROPONINI in the last 168 hours. BNP (last 3 results)  No results for input(s): PROBNP in the last 8760 hours. HbA1C: No results for input(s): HGBA1C in the last 72 hours. CBG: Recent Labs  Lab 03/14/20 1135 03/14/20 1644 03/14/20 2101 03/15/20 0843 03/15/20 1139  GLUCAP 163* 191* 190* 132* 117*   Lipid Profile: No results for input(s): CHOL, HDL, LDLCALC, TRIG, CHOLHDL, LDLDIRECT in the last 72 hours. Thyroid Function Tests: No results for input(s): TSH, T4TOTAL, FREET4, T3FREE, THYROIDAB in the last 72 hours. Anemia Panel: No results for input(s): VITAMINB12, FOLATE, FERRITIN, TIBC, IRON, RETICCTPCT in the last 72 hours. Sepsis Labs: Recent Labs  Lab 03/08/20 1925 03/13/20 0656  PROCALCITON  --  0.37  LATICACIDVEN 1.3  --     Recent Results (from the past 240 hour(s))  Blood culture (routine x 2)     Status: None   Collection Time: 03/08/20  7:26 PM   Specimen: BLOOD  Result Value Ref Range Status   Specimen Description BLOOD RIGHT ANTECUBITAL  Final   Special Requests   Final    BOTTLES DRAWN AEROBIC AND ANAEROBIC Blood Culture results may not be optimal due to an excessive volume of blood received in culture bottles   Culture   Final    NO GROWTH 5 DAYS Performed at [facility], [address] [place], [place] [address]    Report Status 03/13/2020 FINAL  Final  Blood culture (routine x 2)     Status: None   Collection Time: 03/09/20 12:55 AM   Specimen:  BLOOD  Result Value Ref Range Status   Specimen Description BLOOD LEFT [facility]  Final   Special Requests   Final    BOTTLES DRAWN AEROBIC AND ANAEROBIC Blood Culture adequate volume   Culture   Final    NO GROWTH 5 DAYS Performed at [facility], [address] [place], [place] [address]    Report Status 03/14/2020 FINAL  Final  SARS Coronavirus 2 by RT PCR (hospital order, performed in [facility] hospital lab) Nasopharyngeal Nasopharyngeal Swab     Status: None   Collection Time: 03/09/20 12:55 AM   Specimen: Nasopharyngeal Swab  Result Value Ref Range Status   SARS Coronavirus 2 NEGATIVE NEGATIVE Final    Comment: (NOTE) SARS-CoV-2 target nucleic acids are NOT DETECTED.  The SARS-CoV-2 RNA is generally detectable in upper and lower respiratory specimens during the acute phase of infection. The lowest concentration of SARS-CoV-2 viral copies this assay can detect is 250 copies / mL. A negative result does not preclude SARS-CoV-2 infection and should not be used as the sole basis for treatment or other patient management decisions.  A negative result may occur with improper specimen collection / handling, submission of specimen other than nasopharyngeal swab, presence of viral mutation(s) within the areas targeted by this assay, and inadequate number of viral copies (<250 copies / mL). A negative result must be combined with clinical observations, patient history, and epidemiological information.  Fact Sheet for Patients:   StrictlyIdeas.no  Fact Sheet for Healthcare Providers: BankingDealers.co.za  This test is not yet approved or  cleared by the [place] FDA and has been authorized for detection and/or diagnosis of SARS-CoV-2 by FDA under an Emergency Use Authorization (EUA).  This EUA will remain in effect (meaning this test can be used) for the duration of the COVID-19 declaration under Section 564(b)(1) of the Act,  21 U.S.C. section 360bbb-3(b)(1), unless the authorization is terminated or revoked sooner.  Performed at [facility], [place], [place]  [address]   Aerobic/Anaerobic Culture (surgical/deep wound)     Status: None   Collection Time: 03/09/20  5:28 PM   Specimen: Wound; Abscess  Result Value Ref Range Status   Specimen Description WOUND RIGHT FOOT  Final   Special Requests NONE  Final   Gram Stain   Final    RARE WBC PRESENT, PREDOMINANTLY PMN NO ORGANISMS SEEN    Culture   Final    FEW METHICILLIN RESISTANT STAPHYLOCOCCUS AUREUS NO ANAEROBES ISOLATED Performed at [facility], [address] [place], [place] [address]    Report Status 03/14/2020 FINAL  Final   Organism ID, Bacteria METHICILLIN RESISTANT STAPHYLOCOCCUS AUREUS  Final      Susceptibility   Methicillin resistant staphylococcus aureus - MIC*    CIPROFLOXACIN >=8 RESISTANT Resistant     ERYTHROMYCIN >=8 RESISTANT Resistant     GENTAMICIN <=0.5 SENSITIVE Sensitive     OXACILLIN >=4 RESISTANT Resistant     TETRACYCLINE <=1 SENSITIVE Sensitive     VANCOMYCIN <=0.5 SENSITIVE Sensitive     TRIMETH/SULFA 80 RESISTANT Resistant     CLINDAMYCIN <=0.25 SENSITIVE Sensitive     RIFAMPIN <=0.5 SENSITIVE Sensitive     Inducible Clindamycin NEGATIVE Sensitive     * FEW METHICILLIN RESISTANT STAPHYLOCOCCUS AUREUS  Aerobic/Anaerobic Culture (surgical/deep wound)     Status: None (Preliminary result)   Collection Time: 03/10/20  8:03 PM   Specimen: PATH Other; Wound  Result Value Ref Range Status   Specimen Description   Final    ABSCESS Performed at [facility], [address] [place], [place] [address]    Special Requests DEEP  Final   Gram Stain   Final    FEW WBC PRESENT, PREDOMINANTLY PMN FEW GRAM POSITIVE COCCI IN PAIRS IN CLUSTERS Performed at [facility], [place] [address] [place], [place] [address]    Culture   Final    ABUNDANT METHICILLIN RESISTANT  STAPHYLOCOCCUS AUREUS NO ANAEROBES ISOLATED; CULTURE IN PROGRESS FOR 5 DAYS    Report Status PENDING  Incomplete   Organism ID, Bacteria METHICILLIN RESISTANT STAPHYLOCOCCUS AUREUS  Final      Susceptibility   Methicillin resistant staphylococcus aureus - MIC*    CIPROFLOXACIN >=8 RESISTANT Resistant     ERYTHROMYCIN >=8 RESISTANT Resistant     GENTAMICIN <=0.5 SENSITIVE Sensitive     OXACILLIN >=4 RESISTANT Resistant     TETRACYCLINE <=1 SENSITIVE Sensitive     VANCOMYCIN <=0.5 SENSITIVE Sensitive     TRIMETH/SULFA 160 RESISTANT Resistant     CLINDAMYCIN <=0.25 SENSITIVE Sensitive     RIFAMPIN <=0.5 SENSITIVE Sensitive     Inducible Clindamycin NEGATIVE Sensitive     * ABUNDANT METHICILLIN RESISTANT STAPHYLOCOCCUS AUREUS  MRSA PCR Screening     Status: None   Collection Time: 03/14/20  1:36 PM   Specimen: Nasopharyngeal  Result Value Ref Range Status   MRSA by PCR NEGATIVE NEGATIVE Final    Comment:        The GeneXpert MRSA Assay (FDA approved for NASAL specimens only), is one component of a comprehensive MRSA colonization surveillance program. It is not intended to diagnose MRSA infection nor to guide or monitor treatment for MRSA infections. Performed at [facility], [address] [place], [place] [address]          Radiology Studies: DG Chest 1 View  Result Date: 03/14/2020 CLINICAL DATA:  Shortness of breath. EXAM: CHEST  1 VIEW COMPARISON:  March 12, 2020 FINDINGS: Decreased  lung volumes are again seen which is likely, in part, secondary to the degree of patient inspiration. Very mildly increased bronchovascular lung markings are noted without visualization of the patchy bilateral airspace opacities seen on the prior study. There is no evidence of a pleural effusion or pneumothorax. The heart size and mediastinal contours are within normal limits. The visualized skeletal structures are unremarkable. IMPRESSION: Very mildly increased bronchovascular  lung markings without visualization of the patchy bilateral airspace opacities seen on the prior chest plain film. Electronically Signed   By: Virgina Norfolk M.D.   On: 03/14/2020 03:11        Scheduled Meds: . aspirin EC  81 mg Oral Daily  . atorvastatin  40 mg Oral QHS  . calcium-vitamin D  1 tablet Oral Q breakfast  . cholecalciferol  1,000 Units Oral Daily  . clopidogrel  75 mg Oral Q breakfast  . enoxaparin (LOVENOX) injection  40 mg Subcutaneous Q24H  . insulin aspart  0-5 Units Subcutaneous QHS  . insulin aspart  0-9 Units Subcutaneous TID WC  . insulin detemir  10 Units Subcutaneous QHS  . linagliptin  5 mg Oral Daily  . lisinopril  2.5 mg Oral Daily  . pantoprazole  40 mg Oral Daily  . sodium chloride flush  3 mL Intravenous Q12H  . terbinafine   Topical BID  . vitamin B-12  500 mcg Oral Daily  . zonisamide  100 mg Oral Daily   Continuous Infusions: . sodium chloride 200 mL/hr at 03/15/20 0758  . linezolid (ZYVOX) IV 600 mg (03/15/20 1350)    Assessment & Plan:   Active Problems:   Lower extremity cellulitis  Sepsis 2/2Right lower extremity/mainly foot severe cellulitis septic arthritis of the fifth MTP joint with osteomyelitis including the fifth metatarsal head and status proximal phalanx along with multiple abscesses in the setting of uncontrolled type 2 diabetes mellitus and diabetic foot.=-  Vascular consulted  s/p RLE angiogram with successful revascularization - POD#2..>on asa/plavix.f/u as outpt 9/16. S/p ID today by Dr. Cleda Mccreedy IDs input was appreciated.  Recommends vancomycin, will await further recommendation for duration and route of antibiotics..>now on linozolid Abscess culture MRSA, was on vancomycin now changed to [place]. Wound culture MRSA -  Confusion/Encephalopathy-  Appears to have improved.  She is awake alert and appropriate responding appropriately.   This happened in setting of multifocal pneumonia and worsening respiratory status   Afebrile, leukocytosis improving      Uncontrolled Hyperglycemia in the setting of Type 2 Diabetes Mellitus -Patient hemoglobin A1c was 9.7 BG stable here Continue RISS, Trajenta, hypoglycemic protocal   EssentialHypertension. -Stable, continue with lisinopril 2.5 mg daily    Hyperlipidemia Continue statins  GERD. Continue PPI  Hyponatremia/Hypochloremia -, Improving, last sodium 134  Monitor intermittently   Loose Stools -In the setting of Antibiotics Now improved.  Leukocytosis trending down, likely from cellulitis/wound infection of Rt foot Hold laxatives.    Hypokalemia Stable, K 3.9 Monitor intermittently  Thrombocytosis -Patient's platelet count on admission was 570 and went to 532 -> 454 -> 530 -> 567 -> 637..>701 Will consult hematology  Mild AKI Metabolic acidosis, mild -Patient's BUNs/creatinine on admission was 18/1.17 and improving and is now 19/0.93 AG nml.  Avoid neprhotoxic meds, contrast dyes, and hypotenstion Ck am labs.    NormocyticAnemia -Patient's hemoglobin/hematocrit admission was 10.5/30.8 and i dropped to 7.6/22.9 likely in the setting of a dilutional drop from IV fluids H/h has been stable now Continue to monitor   DiabeticPeripheralNeuropathy. -Continue with  duloxetine     History ofSeizures -Continue with zonisamide  Has had a childhood febrile seizure and questionable if eclampsia     Hypomagnesemia Was supplemented via IV yesterday we will check a.m. labs  DVT prophylaxis: Lovenox Code Status: Full  family Communication: None at bedside  Status is: Inpatient  Remains inpatient appropriate because:Ongoing diagnostic testing needed not appropriate for outpatient work up   Dispo: The patient is from: Home              Anticipated d/c is to: TBD              Anticipated d/c date is: 2              Patient currently is not medically stable to d/c.requiring more intervention, hematology  consulted.           LOS: 6 days   Time spent: 45 min with >50% on coc    Nolberto Hanlon, MD [facility] Pager [phone]  If 7PM-7AM, please contact night-coverage [email or web address] Password [facility] 03/15/2020, 2:49 PM

## 2020-03-15 NOTE — Progress Notes (Signed)
ID Pt was taken back for debridement of the rt foot infection She had diarrhea last night as per partner at bedside. Partner was not happy when I asked the patient to tell me what her symptoms were and he left the room No fever No sob No cough Patient Vitals for the past 24 hrs:  BP Temp Temp src Pulse Resp SpO2  03/15/20 1836 110/72 98.7 F (37.1 C) Oral (!) 106 17 95 %  03/15/20 1704 114/73 97.6 F (36.4 C) Oral (!) 111 17 100 %  03/15/20 1449 111/69 98 F (36.7 C) Oral (!) 114 18 98 %  03/15/20 1246 120/79 97.8 F (36.6 C) Axillary (!) 105 17 97 %  03/15/20 1137 115/74 97.7 F (36.5 C) Oral (!) 102 19 94 %  03/15/20 1056 113/76 97.7 F (36.5 C) Oral (!) 102 17 93 %  03/15/20 1010 110/74 -- -- (!) 105 17 93 %  03/15/20 0941 104/69 (!) 97.3 F (36.3 C) -- (!) 107 18 94 %  03/15/20 0927 116/76 -- -- (!) 107 (!) 9 94 %  03/15/20 0912 103/71 -- -- (!) 110 14 92 %  03/15/20 0857 102/68 -- -- (!) 110 20 92 %  03/15/20 0842 (!) 97/58 (!) 97.1 F (36.2 C) -- (!) 104 15 91 %  03/15/20 0746 125/79 98.1 F (36.7 C) Oral 90 18 --  03/15/20 0417 119/70 97.9 F (36.6 C) Oral 88 16 96 %  03/14/20 2317 130/69 98.7 F (37.1 C) Oral 100 16 100 %  03/14/20 2002 125/76 99 F (37.2 C) Oral (!) 105 17 98 %    O/e awake  Chest b/l air entry HS s1s2 abd soft  Rt foot- surgical dressing not removed  03/14/20     CBC Latest Ref Rng & Units 03/15/2020 03/14/2020 03/13/2020  WBC 4.0 - 10.5 K/uL 16.8(H) 17.5(H) 19.5(H)  Hemoglobin 12.0 - 15.0 g/dL 8.1(L) 8.0(L) 8.3(L)  Hematocrit 36 - 46 % 25.1(L) 24.1(L) 25.0(L)  Platelets 150 - 400 K/uL 701(H) 673(H) 637(H)    CMP Latest Ref Rng & Units 03/14/2020 03/13/2020 03/12/2020  Glucose 70 - 99 mg/dL 134(H) 147(H) 112(H)  BUN 6 - 20 mg/dL 13 18 19   Creatinine 0.44 - 1.00 mg/dL 0.97 1.01(H) 0.93  Sodium 135 - 145 mmol/L 134(L) 133(L) 132(L)  Potassium 3.5 - 5.1 mmol/L 3.9 4.1 4.0  Chloride 98 - 111 mmol/L 104 101 102  CO2 22 - 32 mmol/L 19(L)  22 19(L)  Calcium 8.9 - 10.3 mg/dL 8.1(L) 8.1(L) 8.0(L)  Total Protein 6.5 - 8.1 g/dL 6.8 7.2 -  Total Bilirubin 0.3 - 1.2 mg/dL 0.5 0.4 -  Alkaline Phos 38 - 126 U/L 116 139(H) -  AST 15 - 41 U/L 9(L) 12(L) -  ALT 0 - 44 U/L 7 8 -    MRI from 03/10/20 Severe diffuse subcutaneous soft tissue swelling/edema/fluid mainly involving the dorsum of the foot. I do not see any increased T1 signal intensity to suggest hemorrhage/hematoma. More focal appearance of the fluid signal intensity on the dorsum of the foot is suspicious for an abscess but difficult to be certain without contrast. It measures approximately 5 x 4 x 2 cm. There is also a more focal fluid collection along the plantar aspect of the fifth toe and also along the plantar aspect of the foot overlying the fourth metatarsal region. These could be abscesses also.There is T1 and T2 signal abnormality noted in the fifth metatarsal head and fifth proximal phalanx worrisome for  osteomyelitis. Could not exclude septic arthritis at the fifth MTP joint. The surrounding fluid collections are suspicious for abscesses.   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

## 2020-03-15 NOTE — Progress Notes (Signed)
Pts sister, Rhoderick Moody, is calling pt and harrassing this RN this shift. Pt does not want any info given to this person and does not want her to call, however, cannot stop the calls to her room.

## 2020-03-15 NOTE — Op Note (Signed)
Date of operation: 03/15/2020.  Surgeon: Durward Fortes D.P.M.  Preoperative diagnosis: Continued abscess multiple sites right foot.  Postoperative diagnosis: Same.  Procedure: Multiple incision I&D right foot.  Anesthesia: LMA.  Hemostasis: None.  Estimated blood loss: 50 cc.  Implants: Stimulan rapid cure antibiotic beads impregnated with vancomycin.  Injectables: 18 cc 0.5% Marcaine plain.  Complications: None apparent.  Operative indications: This is a 44 year old female with recent I&D with continued significant infection and abscess in the right foot.  Decision was made to repeat her I&D in an attempt for limb salvage.  Operative procedure: Patient was taken to the operating room and placed on the table in the supine position.  Following satisfactory LMA anesthesia the right foot was prepped and draped in the usual sterile fashion.  Attention directed to the right foot and all of the previous incisions were opened back up with removal of the sutures.  Significant necrotic tissue was noted over the dorsal forefoot and all necrotic tissue was sharply incised using a 15 blade down to the level of tendon and deep subcutaneous.  Significant purulence was noted and the dorsal and plantar wounds.  Using a versa jet debrider on a setting of between 3 and 5 all devitalized tissue was debrided from all 3 incision areas along the lateral aspect of the foot, dorsal aspect of the foot, and plantar area.  The wound was then flushed with 3 L of saline under pulsed irrigation.  Partial closure of the lateral and plantar incisions using 3-0 nylon simple interrupted sutures.  Antibiotic beads then placed into each of the wounds with the large dorsal open wound covered with Mepitel and staples as well as the remaining open portion of the plantar wound.  Remainder of the lateral incision area was closed using 3-0 nylon simple interrupted sutures.  18 cc of 0.5% Marcaine plain was then injected for  postoperative analgesia.  Xeroform applied over the fifth metatarsal area followed by 4 x 4's ABDs and Kerlix followed by a second Kerlix and Ace wrap.  The patient was awakened and transported to the PACU with vital signs stable and in good condition.

## 2020-03-15 NOTE — Plan of Care (Signed)

## 2020-03-15 NOTE — Anesthesia Postprocedure Evaluation (Signed)
Anesthesia Post Note  Patient: Kristy Mcmillan  Procedure(s) Performed: IRRIGATION AND DEBRIDEMENT FOOT (Right )  Patient location during evaluation: PACU Anesthesia Type: General Level of consciousness: awake and alert Pain management: pain level controlled Vital Signs Assessment: post-procedure vital signs reviewed and stable Respiratory status: spontaneous breathing, nonlabored ventilation, respiratory function stable and patient connected to nasal cannula oxygen Cardiovascular status: blood pressure returned to baseline and stable Postop Assessment: no apparent nausea or vomiting Anesthetic complications: no   No complications documented.   Last Vitals:  Vitals:   03/15/20 1010 03/15/20 1056  BP: 110/74 113/76  Pulse: (!) 105 (!) 102  Resp: 17 17  Temp:  36.5 C  SpO2: 93% 93%    Last Pain:  Vitals:   03/15/20 1100  TempSrc:   PainSc: 0-No pain                 Molli Barrows

## 2020-03-15 NOTE — Transfer of Care (Signed)
Immediate Anesthesia Transfer of Care Note  Patient: Kristy Mcmillan  Procedure(s) Performed: IRRIGATION AND DEBRIDEMENT FOOT (Right )  Patient Location: PACU  Anesthesia Type:General  Level of Consciousness: drowsy  Airway & Oxygen Therapy: Patient Spontanous Breathing and Patient connected to face mask oxygen  Post-op Assessment: Report given to RN and Post -op Vital signs reviewed and stable  Post vital signs: Reviewed and stable  Last Vitals:  Vitals Value Taken Time  BP 97/58 03/15/20 0842  Temp    Pulse 106 03/15/20 0844  Resp 17 03/15/20 0844  SpO2 95 % 03/15/20 0844  Vitals shown include unvalidated device data.  Last Pain:  Vitals:   03/15/20 0746  TempSrc: Oral  PainSc: 10-Worst pain ever         Complications: No complications documented.

## 2020-03-15 NOTE — Progress Notes (Signed)
Pts spouse in room. Told the RN that he was given permission to stay overnight to help pt eat and comply to medication. Pt and spouse state that the antibiotics are making her sick. Zofran given for nausea/vomiting after po meds. Explained that infection was very bad and that is why she has to be on these antibiotics. Will give Zofran as needed. Husband thankful.

## 2020-03-15 NOTE — Progress Notes (Signed)
Pharmacy Antibiotic Note  Kristy Mcmillan is a 44 y.o. female admitted on 03/09/2020 with cellulitis.  Pharmacy has been consulted for vancomycin dosing. Patient received vanc 1.5g IV load in the ED and was started on  vanc 1g IV q12h.  9/13 @ 0117 Vanc trough= 30.   Bcx: NG TD.   Wound Cx: MRSA   Plan: Continue vancomycin to 1500mg  IV every 24 hours.    Goal trough 15 - 20 mcg/mL  Will monitor renal function closely and adjust doses as needed.  Patient also on Ceftriaxone 2gm Q24h    Height: 5\' 4"  (162.6 cm) Weight: 75.8 kg (167 lb 1.7 oz) IBW/kg (Calculated) : 54.7  Temp (24hrs), Avg:98.2 F (36.8 C), Min:97.1 F (36.2 C), Max:99 F (37.2 C)  Recent Labs  Lab 03/08/20 1925 03/09/20 0514 03/11/20 0911 03/12/20 0117 03/12/20 0649 03/13/20 0656 03/14/20 0430 03/15/20 0619  WBC 26.8*   < > 28.4* 24.2*  --  19.5* 17.5* 16.8*  CREATININE 1.17*   < > 0.96 0.99 0.93 1.01* 0.97  --   LATICACIDVEN 1.3  --   --   --   --   --   --   --   VANCOTROUGH  --   --   --  30*  --   --   --   --    < > = values in this interval not displayed.    Estimated Creatinine Clearance: 73.7 mL/min (by C-G formula based on SCr of 0.97 mg/dL).    Allergies  Allergen Reactions  . Dilantin [Phenytoin Sodium Extended] Other (See Comments)    Causes seizures  . Phenobarbital Other (See Comments)    Causes seizures Other reaction(s): Unknown Causes seizures  . Phenytoin Sodium Extended     Other reaction(s): Other (See Comments) Causes seizures  . Aspartame Diarrhea and Nausea And Vomiting  . Aspartame And Phenylalanine Diarrhea and Nausea And Vomiting  . Carbamazepine Other (See Comments)    Other reaction(s): Unknown  . Cymbalta [Duloxetine Hcl] Diarrhea  . Gabapentin Other (See Comments)    Other reaction(s): Unknown Feels like she is going to have a seizure  . Ciprofloxacin Rash    blisters  . Humalog [Insulin Lispro] Rash  . Lantus [Insulin Glargine] Rash  . Novolin 70-30  [Insulin Nph Isophane & Regular] Itching and Rash  . Penicillins Itching and Rash   Cefepime 9/10 >> 9/13 Vanc 9/10>> Flagyl x1 9/10 Ceftriaxone 9/13>> Azithromycin 9/13>> 9/15   Thank you for allowing pharmacy to be a part of this patient's care.  Noralee Space, PharmD, BCPS Clinical Pharmacist 03/15/2020 9:41 AM

## 2020-03-15 NOTE — Interval H&P Note (Signed)
History and Physical Interval Note:  03/15/2020 7:03 AM  Kristy Mcmillan  has presented today for surgery, with the diagnosis of RIGHT FOOT OSTEOMYELITIS.  The various methods of treatment have been discussed with the patient and family. After consideration of risks, benefits and other options for treatment, the patient has consented to  Procedure(s): IRRIGATION AND DEBRIDEMENT FOOT (Right) as a surgical intervention.  The patient's history has been reviewed, patient examined, no change in status, stable for surgery.  I have reviewed the patient's chart and labs.  Questions were answered to the patient's satisfaction.     Durward Fortes

## 2020-03-16 DIAGNOSIS — D696 Thrombocytopenia, unspecified: Secondary | ICD-10-CM

## 2020-03-16 DIAGNOSIS — I739 Peripheral vascular disease, unspecified: Secondary | ICD-10-CM

## 2020-03-16 DIAGNOSIS — D509 Iron deficiency anemia, unspecified: Secondary | ICD-10-CM

## 2020-03-16 DIAGNOSIS — G63 Polyneuropathy in diseases classified elsewhere: Secondary | ICD-10-CM

## 2020-03-16 LAB — BASIC METABOLIC PANEL
Anion gap: 10 (ref 5–15)
BUN: 11 mg/dL (ref 6–20)
CO2: 22 mmol/L (ref 22–32)
Calcium: 7.7 mg/dL — ABNORMAL LOW (ref 8.9–10.3)
Chloride: 104 mmol/L (ref 98–111)
Creatinine, Ser: 0.8 mg/dL (ref 0.44–1.00)
GFR calc Af Amer: >60 mL/min (ref >60)
GFR calc non Af Amer: >60 mL/min (ref >60)
Glucose, Bld: 164 mg/dL — ABNORMAL HIGH (ref 70–99)
Potassium: 3.9 mmol/L (ref 3.5–5.1)
Sodium: 136 mmol/L (ref 135–145)

## 2020-03-16 LAB — IRON AND TIBC
Iron: 22 ug/dL — ABNORMAL LOW (ref 28–170)
Saturation Ratios: 11 % (ref 10.4–31.8)
TIBC: 195 ug/dL — ABNORMAL LOW (ref 250–450)
UIBC: 173 ug/dL

## 2020-03-16 LAB — CBC WITH DIFFERENTIAL/PLATELET
Abs Immature Granulocytes: 0.61 10*3/uL — ABNORMAL HIGH (ref 0.00–0.07)
Basophils Absolute: 0.1 10*3/uL (ref 0.0–0.1)
Basophils Relative: 0 %
Eosinophils Absolute: 0.3 10*3/uL (ref 0.0–0.5)
Eosinophils Relative: 3 %
HCT: 22 % — ABNORMAL LOW (ref 36.0–46.0)
Hemoglobin: 6.9 g/dL — ABNORMAL LOW (ref 12.0–15.0)
Immature Granulocytes: 4 %
Lymphocytes Relative: 26 %
Lymphs Abs: 3.6 10*3/uL (ref 0.7–4.0)
MCH: 27.3 pg (ref 26.0–34.0)
MCHC: 31.4 g/dL (ref 30.0–36.0)
MCV: 87 fL (ref 80.0–100.0)
Monocytes Absolute: 0.7 10*3/uL (ref 0.1–1.0)
Monocytes Relative: 5 %
Neutro Abs: 8.5 10*3/uL — ABNORMAL HIGH (ref 1.7–7.7)
Neutrophils Relative %: 62 %
Platelets: 574 10*3/uL — ABNORMAL HIGH (ref 150–400)
RBC: 2.53 MIL/uL — ABNORMAL LOW (ref 3.87–5.11)
RDW: 13.3 % (ref 11.5–15.5)
WBC: 13.8 10*3/uL — ABNORMAL HIGH (ref 4.0–10.5)
nRBC: 0.1 % (ref 0.0–0.2)

## 2020-03-16 LAB — AEROBIC/ANAEROBIC CULTURE W GRAM STAIN (SURGICAL/DEEP WOUND)

## 2020-03-16 LAB — GLUCOSE, CAPILLARY
Glucose-Capillary: 165 mg/dL — ABNORMAL HIGH (ref 70–99)
Glucose-Capillary: 202 mg/dL — ABNORMAL HIGH (ref 70–99)
Glucose-Capillary: 168 mg/dL — ABNORMAL HIGH (ref 70–99)
Glucose-Capillary: 180 mg/dL — ABNORMAL HIGH (ref 70–99)
Glucose-Capillary: 160 mg/dL — ABNORMAL HIGH (ref 70–99)

## 2020-03-16 LAB — MAGNESIUM: Magnesium: 1.7 mg/dL (ref 1.7–2.4)

## 2020-03-16 LAB — PREPARE RBC (CROSSMATCH)

## 2020-03-16 LAB — VITAMIN B12: Vitamin B-12: 439 pg/mL (ref 180–914)

## 2020-03-16 LAB — FERRITIN: Ferritin: 165 ng/mL (ref 11–307)

## 2020-03-16 LAB — FOLATE: Folate: 10.7 ng/mL (ref >5.9)

## 2020-03-16 LAB — DAT, POLYSPECIFIC AHG (ARMC ONLY): Polyspecific AHG test: NEGATIVE

## 2020-03-16 LAB — LACTATE DEHYDROGENASE: LDH: 143 U/L (ref 98–192)

## 2020-03-16 MED ORDER — FERROUS SULFATE 325 (65 FE) MG PO TABS
325.0000 mg | ORAL_TABLET | Freq: Every day | ORAL | Status: DC
  Administered 2020-03-17: 325 mg via ORAL
  Filled 2020-03-16: qty 1

## 2020-03-16 MED ORDER — DIPHENHYDRAMINE HCL 25 MG PO CAPS
25.0000 mg | ORAL_CAPSULE | Freq: Once | ORAL | Status: DC
  Filled 2020-03-16: qty 1

## 2020-03-16 MED ORDER — LINEZOLID 600 MG PO TABS: 600.0000 mg | ORAL_TABLET | Freq: Two times a day (BID) | ORAL | 0 refills | Status: DC

## 2020-03-16 MED ORDER — ACETAMINOPHEN 325 MG PO TABS
650.0000 mg | ORAL_TABLET | Freq: Once | ORAL | Status: DC
  Filled 2020-03-16: qty 2

## 2020-03-16 MED ORDER — SODIUM CHLORIDE 0.9% IV SOLUTION: Freq: Once | INTRAVENOUS | Status: AC

## 2020-03-16 NOTE — Progress Notes (Signed)
1 Day Post-Op   Subjective/Chief Complaint: Patient seen.  Still significant pain in the right foot with no significant improvement after her second surgery.   Objective: Vital signs in last 24 hours: Temp:  [97.6 F (36.4 C)-98.7 F (37.1 C)] 98.4 F (36.9 C) (09/17 0743) Pulse Rate:  [96-114] 103 (09/17 0743) Resp:  [17-18] 18 (09/17 0743) BP: (110-140)/(69-100) 140/82 (09/17 0743) SpO2:  [94 %-100 %] 100 % (09/17 0743) Last BM Date: 03/15/20  Intake/Output from previous day: 09/16 0701 - 09/17 0700 In: 1733.1 [I.V.:1000; IV Piggyback:733.1] Out: 5 [Blood:5] Intake/Output this shift: No intake/output data recorded.  Bandage on the right foot is dry and intact.  Upon removal there is moderate to heavy bleeding with minimal signs of drainage or purulence.  Significant improvement with the erythema and edema.  White blood cell count significantly improved.        Lab Results:  Recent Labs    03/15/20 0619 03/16/20 0428  WBC 16.8* 13.8*  HGB 8.1* 6.9*  HCT 25.1* 22.0*  PLT 701* 574*   BMET Recent Labs    03/14/20 0430 03/16/20 0428  NA 134* 136  K 3.9 3.9  CL 104 104  CO2 19* 22  GLUCOSE 134* 164*  BUN 13 11  CREATININE 0.97 0.80  CALCIUM 8.1* 7.7*   PT/INR No results for input(s): LABPROT, INR in the last 72 hours. ABG No results for input(s): PHART, HCO3 in the last 72 hours.  Invalid input(s): PCO2, PO2  Studies/Results: No results found.  Anti-infectives: Anti-infectives (From admission, onward)   Start     Dose/Rate Route Frequency Ordered Stop   03/15/20 1400  linezolid (ZYVOX) IVPB 600 mg        600 mg 300 mL/hr over 60 Minutes Intravenous Every 12 hours 03/15/20 1256     03/15/20 0808  vancomycin (VANCOCIN) powder  Status:  Discontinued          As needed 03/15/20 0808 03/15/20 0839   03/13/20 0200  vancomycin (VANCOREADY) IVPB 1500 mg/300 mL  Status:  Discontinued        1,500 mg 150 mL/hr over 120 Minutes Intravenous Every 24 hours  03/12/20 0915 03/15/20 1445   03/12/20 2200  cefTRIAXone (ROCEPHIN) 2 g in sodium chloride 0.9 % 100 mL IVPB  Status:  Discontinued        2 g 200 mL/hr over 30 Minutes Intravenous Every 24 hours 03/12/20 1535 03/15/20 1257   03/12/20 1800  azithromycin (ZITHROMAX) 500 mg in sodium chloride 0.9 % 250 mL IVPB  Status:  Discontinued        500 mg 250 mL/hr over 60 Minutes Intravenous Every 24 hours 03/12/20 1545 03/14/20 1739   03/12/20 0440  vancomycin variable dose per unstable renal function (pharmacist dosing)  Status:  Discontinued         Does not apply See admin instructions 03/12/20 0440 03/12/20 0915   03/10/20 2018  vancomycin (VANCOCIN) powder  Status:  Discontinued          As needed 03/10/20 2018 03/10/20 2042   03/09/20 1400  vancomycin (VANCOCIN) IVPB 1000 mg/200 mL premix  Status:  Discontinued        1,000 mg 200 mL/hr over 60 Minutes Intravenous Every 12 hours 03/09/20 0301 03/12/20 0440   03/09/20 0900  ceFEPIme (MAXIPIME) 2 g in sodium chloride 0.9 % 100 mL IVPB  Status:  Discontinued        2 g 200 mL/hr over 30 Minutes Intravenous Every  8 hours 03/09/20 0144 03/12/20 1535   03/09/20 0200  vancomycin (VANCOREADY) IVPB 500 mg/100 mL        500 mg 100 mL/hr over 60 Minutes Intravenous  Once 03/09/20 0149 03/09/20 0437   03/09/20 0145  vancomycin (VANCOCIN) IVPB 1000 mg/200 mL premix  Status:  Discontinued        1,000 mg 200 mL/hr over 60 Minutes Intravenous  Once 03/09/20 0144 03/09/20 0149   03/09/20 0030  ceFEPIme (MAXIPIME) 2 g in sodium chloride 0.9 % 100 mL IVPB        2 g 200 mL/hr over 30 Minutes Intravenous  Once 03/09/20 0019 03/09/20 0133   03/09/20 0030  metroNIDAZOLE (FLAGYL) IVPB 500 mg        500 mg 100 mL/hr over 60 Minutes Intravenous  Once 03/09/20 0019 03/09/20 0214   03/09/20 0030  vancomycin (VANCOCIN) IVPB 1000 mg/200 mL premix        1,000 mg 200 mL/hr over 60 Minutes Intravenous  Once 03/09/20 0019 03/09/20 0332      Assessment/Plan: s/p  Procedure(s): IRRIGATION AND DEBRIDEMENT FOOT (Right) Assessment: Stable status post I&D right foot.   Plan: Sterile dressing reapplied to the right foot.  Patient was seen in consultation today with Dr. Delaine Lame.  Patient should be stable for discharge with oral antibiotics, i.e. Linezolid.  Patient will be nonweightbearing to her right lower extremity.  Patient will also need dressing changes 3 times a week with a bulky gauze bandage.  We will place orders for home health care but if home health care cannot be obtained her husband may need to be trained to change her bandage or she could possibly required skilled nursing.  Ideally it would be best if we can get home health care to come out.  I will plan for follow-up with the patient in 2 weeks.  If the patient is still in the hospital orders will be placed for nursing to change the bandages every 2 days.  LOS: 7 days    Durward Fortes 03/16/2020

## 2020-03-16 NOTE — Progress Notes (Addendum)
ID Doing better Says insulin makes her vomit and has been refusing it Pt seen with Dr.Cline Patient Vitals for the past 24 hrs:  BP Temp Temp src Pulse Resp SpO2  03/16/20 0743 140/82 98.4 F (36.9 C) Oral (!) 103 18 100 %  03/16/20 0248 122/70 98.4 F (36.9 C) Oral 96 18 98 %  03/15/20 2350 114/69 97.9 F (36.6 C) Oral (!) 102 18 95 %  03/15/20 1959 (!) 127/100 98 F (36.7 C) Oral 100 17 94 %  03/15/20 1836 110/72 98.7 F (37.1 C) Oral (!) 106 17 95 %  03/15/20 1704 114/73 97.6 F (36.4 C) Oral (!) 111 17 100 %  03/15/20 1449 111/69 98 F (36.7 C) Oral (!) 114 18 98 %  03/15/20 1246 120/79 97.8 F (36.6 C) Axillary (!) 105 17 97 %  03/15/20 1137 115/74 97.7 F (36.5 C) Oral (!) 102 19 94 %  03/15/20 1056 113/76 97.7 F (36.5 C) Oral (!) 102 17 93 %  o/e awake and alert No distress Chest b/l air entry Poor dentition Hss1s2 tachycardia Rt foot     5th toe amputation site looks clean- doral wound debrided area covered with mepitel Foot swelling much resolved Minimal  erythema    CBC Latest Ref Rng & Units 03/16/2020 03/15/2020 03/14/2020  WBC 4.0 - 10.5 K/uL 13.8(H) 16.8(H) 17.5(H)  Hemoglobin 12.0 - 15.0 g/dL 6.9(L) 8.1(L) 8.0(L)  Hematocrit 36 - 46 % 22.0(L) 25.1(L) 24.1(L)  Platelets 150 - 400 K/uL 574(H) 701(H) 673(H)    CMP Latest Ref Rng & Units 03/16/2020 03/14/2020 03/13/2020  Glucose 70 - 99 mg/dL 164(H) 134(H) 147(H)  BUN 6 - 20 mg/dL 11 13 18   Creatinine 0.44 - 1.00 mg/dL 0.80 0.97 1.01(H)  Sodium 135 - 145 mmol/L 136 134(L) 133(L)  Potassium 3.5 - 5.1 mmol/L 3.9 3.9 4.1  Chloride 98 - 111 mmol/L 104 104 101  CO2 22 - 32 mmol/L 22 19(L) 22  Calcium 8.9 - 10.3 mg/dL 7.7(L) 8.1(L) 8.1(L)  Total Protein 6.5 - 8.1 g/dL - 6.8 7.2  Total Bilirubin 0.3 - 1.2 mg/dL - 0.5 0.4  Alkaline Phos 38 - 126 U/L - 116 139(H)  AST 15 - 41 U/L - 9(L) 12(L)  ALT 0 - 44 U/L - 7 8     Micro Wound culture from 9/10-MRSA Abscess culture-from 9/11 MRSA 9/10 BC-  NG 03/08/20 BC-NG   MRI from 03/10/20 Severe diffuse subcutaneous soft tissue swelling/edema/fluid mainly involving the dorsum of the foot. I do not see any increased T1 signal intensity to suggest hemorrhage/hematoma. More focal appearance of the fluid signal intensity on the dorsum of the foot is suspicious for an abscess but difficult to be certain without contrast. It measures approximately 5 x 4 x 2 cm. There is also a more focal fluid collection along the plantar aspect of the fifth toe and also along the plantar aspect of the foot overlying the fourth metatarsal region. These could be abscesses also.There is T1 and T2 signal abnormality noted in the fifth metatarsal head and fifth proximal phalanx worrisome for osteomyelitis. Could not exclude septic arthritis at the fifth MTP joint. The surrounding fluid collections are suspicious for abscesses.   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 Superficial necrosis of the skin on the dorsum, and it was removed 9/16 by Dr.Cline She was on vanco and ceftriaoxne and  complained of diarrhea and both were discontinued and linezolid IV started  for better tissue penetration as well on 03/15/20 Can switch to PO linezolid - will need for 3 weeks ( may be more) Discussed side effects and foods to avoid    Pt is not taking cymbalta at home so was Dc  On 03/15/20  Diabetes mellitus / and neuropathy Followed by [facility] clinic endocrinologist and neurologist  On insulin, trajenta  HTN on lisinopril On atrovastatin and plavix   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 To check with insurance fro coverage and copay for linezolid Discussed linezolid with patient and also gave her the appt for 03/27/20 at 11am

## 2020-03-16 NOTE — Progress Notes (Signed)
Inpatient Diabetes Program Recommendations  AACE/ADA: New Consensus Statement on Inpatient Glycemic Control (2015)  Target Ranges:  Prepandial:   less than 140 mg/dL      Peak postprandial:   less than 180 mg/dL (1-2 hours)      Critically ill patients:  140 - 180 mg/dL   Lab Results  Component Value Date   GLUCAP 202 (H) 03/16/2020   HGBA1C 9.7 (H) 03/09/2020    Review of Glycemic Control Results for Kristy Mcmillan, ETHRIDGE (MRN [id number]) as of 03/16/2020 12:18  Ref. Range 03/15/2020 16:19 03/15/2020 17:38 03/15/2020 20:25 03/16/2020 07:44 03/16/2020 12:02  Glucose-Capillary Latest Ref Range: 70 - 99 mg/dL 217 (H) 209 (H) 170 (H) 165 (H) 202 (H)   Diabetes history: DM Outpatient Diabetes medications: Levemir 10 units + Novolog 6 units tid meal coverage + Tradjenta 5 mg Current orders for Inpatient glycemic control: Levemir 10 units + Novolog resistant 0-9 units + 0-5 units hs + Tradjenta 5 mg qd  Inpatient Diabetes Program Recommendations:   Spoke with pt about A1C 9.7 (average blood glucose 232 over the past 2-3 months)  and explained what an A1C is, basic pathophysiology of DM Type 2, basic home care, basic diabetes diet nutrition principles, importance of checking CBGs and maintaining good CBG control to prevent long-term and short-term complications. Reviewed signs and symptoms of hyperglycemia and hypoglycemia and how to treat hypoglycemia at home. Also reviewed blood sugar goals at home.  RNs to provide ongoing basic DM education at bedside with this patient. Patient refused to take her insulin last hs and this am due to "causes me to feel bad and have diarrhea".  Requested patient to check CBG more often and take values to physician office visits for review. Also discussed need for insulin to assist to control CBGs for wound healing. Patient shared that she does drink sweet tea and has been a challenge for her to give up the tea.   Thank you, Nani Gasser. Tywanda Rice, RN, MSN, CDE  Diabetes  Coordinator Inpatient Glycemic Control Team Team Pager [phone] (8am-5pm) 03/16/2020 12:28 PM
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
## 2020-03-16 NOTE — Progress Notes (Signed)
PROGRESS NOTE    Kristy Mcmillan  ZJI:[id number] DOB: 08/29/1975 DOA: 03/09/2020 [facility]: [facility]    Brief Narrative:  The patient is a 44 year old overweight Caucasian female with a past medical history significant for but not limited to is mellitus type II, history of seizure disorder who presented to the emergency room with acute onset of worsening right foot swelling associated with erythema, pain and tenderness. She had a pedicure on 02/27/2020 before symptoms and began having a fever over the last couple days associated with chills. She denies any nausea or vomiting. She is not vaccinated against COVID-19 disease and upon presentation she is found to be febrile with a tachycardia and a respiratory rate of 29. Labs revealed borderline potassium of 2.5 and she did have a hyponatremia and hypochloremia and elevated blood sugar with a hyperglycemia of 428. In the ED sepsis protocol was started and she is given acetaminophen, IV cefepime, Flagyl and IV vancomycin as well as morphine sulfate IV Zofran. Her potassium was repleted as well. She was admitted for sepsis secondary to right foot extremity cellulitis with right fifth toe necrosis in the setting of an uncontrolled diabetic foot.   **Interim History She had bedside debridement done by podiatry when they were consulted. MRI was finally done. Vascular surgery evaluated and considering an angiogram early next week.  Continues to still be septic and had a temperature, tachycardia and tachypnea overnight which is now improved.  She also started having some loose stools likely antibiotic mediated.  03/11/20 MRI done yesterday showed "Exam significantly limited by patient motion and lack of contrast. 2. 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 3. Suspect large dorsal and smaller plantar forefoot abscesses as detailed  above. 4. Diffuse myofasciitis without obvious pyomyositis."   03/12/20 She was extremely confused today and would not answer questions appropriately.  And overnight her O2 requirements worsen and she needed 5 L.  She got put on oxygen after her surgical procedure and was not weaned off of it and now on 5 L.  Chest x-ray today showed multifocal pneumonia so will obtain a CT scan.  We will also give her IV Lasix, breathing treatments and because of her confusion and altered mental status will obtain a head CT scan without contrast, and ABG, TSH, RPR, vitamin B12 level.  Because they could not consent for the angiogram today given her confusion they are going to postpone it and attempted on Tuesday or Wednesday.  03/13/20 The patient is much more awake today and more oriented but still a little slower to respond.  She underwent an angiogram of her leg today by Dr. Delana Meyer and he felt as if he had a successful recanalization of the right lower extremity for limb salvage as she had a percutaneous transluminal angioplasty of the right tibial peroneal trunk and peroneal to 4 mm using a Lutonix drug-eluting balloon.  Her WBC is improved and she was weaned off of supplemental oxygen today and her repeat chest x-ray still pending.  She is at high risk for further amputation of the right lower extremity to limit her infection and podiatry is going to consider repeating an I&D to try to flush out the areas over time.  9/16 Dr. Caryl Comes did right foot I&D  Consultants:   Podiatry, vascular  Procedures: s/p RLE angiogram with successful revascularization -on 9/14  Antimicrobials:   Vancomycin  Subjective: C/o Rt foot  pain. No other complaints.   Objective: Vitals:   03/15/20 1959 03/15/20 2350 03/16/20 0248 03/16/20 0743  BP: (!) 127/100 114/69 122/70 140/82  Pulse: 100 (!) 102 96 (!) 103  Resp: 17 18 18 18   Temp: 98 F (36.7 C) 97.9 F (36.6 C) 98.4 F (36.9 C) 98.4 F (36.9 C)  TempSrc: Oral Oral  Oral Oral  SpO2: 94% 95% 98% 100%  Weight:      Height:        Intake/Output Summary (Last 24 hours) at 03/16/2020 1415 Last data filed at 03/15/2020 1500 Gross per 24 hour  Intake 733.06 ml  Output --  Net 733.06 ml   Filed Weights   03/08/20 1911 03/13/20 0935  Weight: 75.8 kg 75.8 kg    Examination: Calm comfortable, nad cta no r/w/r RRR, s1/s2 no m/r/g Soft benign, +bs, nt, nd No edema, RLE wrapped in dressing [email or web address]    Data Reviewed: I have personally reviewed following labs and imaging studies  CBC: Recent Labs  Lab 03/11/20 0911 03/11/20 0911 03/12/20 0117 03/13/20 0656 03/14/20 0430 03/15/20 0619 03/16/20 0428  WBC 28.4*   < > 24.2* 19.5* 17.5* 16.8* 13.8*  NEUTROABS 23.8*  --  18.9* 13.6* 12.0*  --  8.5*  HGB 8.2*   < > 7.6* 8.3* 8.0* 8.1* 6.9*  HCT 24.9*   < > 22.9* 25.0* 24.1* 25.1* 22.0*  MCV 84.7   < > 83.6 85.0 83.7 85.4 87.0  PLT 530*   < > 567* 637* 673* 701* 574*   < > = values in this interval not displayed.   Basic Metabolic Panel: Recent Labs  Lab 03/10/20 0458 03/10/20 0458 03/11/20 0911 03/11/20 0911 03/12/20 0117 03/12/20 3154 03/13/20 0656 03/14/20 0430 03/16/20 0428  NA 132*   < > 133*   < > 133* 132* 133* 134* 136  K 3.6   < > 4.4   < > 4.3 4.0 4.1 3.9 3.9  CL 100   < > 102   < > 103 102 101 104 104  CO2 20*   < > 20*   < > 22 19* 22 19* 22  GLUCOSE 188*   < > 145*   < > 106* 112* 147* 134* 164*  BUN 14   < > 17   < > 20 19 18 13 11   CREATININE 0.75   < > 0.96   < > 0.99 0.93 1.01* 0.97 0.80  CALCIUM 8.0*   < > 8.0*   < > 8.3* 8.0* 8.1* 8.1* 7.7*  MG 1.5*   < > 1.9  --  1.8  --  1.8 1.6* 1.7  PHOS 3.0  --  3.7  --  3.7  --  2.8 3.1  --    < > = values in this interval not displayed.   GFR: Estimated Creatinine Clearance: 89.4 mL/min (by C-G formula based on SCr of 0.8 mg/dL). Liver Function Tests: Recent Labs  Lab 03/10/20 0458 03/11/20 0911 03/12/20 0117 03/13/20 0656 03/14/20 0430  AST 12* 12* 11* 12* 9*  ALT  9 8 9 8 7   ALKPHOS 113 131* 161* 139* 116  BILITOT 0.9 0.7 0.7 0.4 0.5  PROT 6.3* 7.0 6.9 7.2 6.8  ALBUMIN 1.8* 1.8* 1.6* 1.7* 1.7*   No results for input(s): LIPASE, AMYLASE in the last 168 hours. Recent Labs  Lab 03/13/20 0656  AMMONIA 16   Coagulation Profile: No results for input(s): INR, PROTIME in the last 168 hours. Cardiac  Enzymes: No results for input(s): CKTOTAL, CKMB, CKMBINDEX, TROPONINI in the last 168 hours. BNP (last 3 results) No results for input(s): PROBNP in the last 8760 hours. HbA1C: No results for input(s): HGBA1C in the last 72 hours. CBG: Recent Labs  Lab 03/15/20 1619 03/15/20 1738 03/15/20 2025 03/16/20 0744 03/16/20 1202  GLUCAP 217* 209* 170* 165* 202*   Lipid Profile: No results for input(s): CHOL, HDL, LDLCALC, TRIG, CHOLHDL, LDLDIRECT in the last 72 hours. Thyroid Function Tests: No results for input(s): TSH, T4TOTAL, FREET4, T3FREE, THYROIDAB in the last 72 hours. Anemia Panel: Recent Labs    03/16/20 0914  FOLATE 10.7  FERRITIN 165  TIBC 195*  IRON 22*   Sepsis Labs: Recent Labs  Lab 03/13/20 0656  PROCALCITON 0.37    Recent Results (from the past 240 hour(s))  Blood culture (routine x 2)     Status: None   Collection Time: 03/08/20  7:26 PM   Specimen: BLOOD  Result Value Ref Range Status   Specimen Description BLOOD RIGHT ANTECUBITAL  Final   Special Requests   Final    BOTTLES DRAWN AEROBIC AND ANAEROBIC Blood Culture results may not be optimal due to an excessive volume of blood received in culture bottles   Culture   Final    NO GROWTH 5 DAYS Performed at [facility], [address] [place], [place] [address]    Report Status 03/13/2020 FINAL  Final  Blood culture (routine x 2)     Status: None   Collection Time: 03/09/20 12:55 AM   Specimen: BLOOD  Result Value Ref Range Status   Specimen Description BLOOD LEFT [facility]  Final   Special Requests   Final    BOTTLES DRAWN AEROBIC AND ANAEROBIC Blood  Culture adequate volume   Culture   Final    NO GROWTH 5 DAYS Performed at [facility], [address] [place], [place] [address]    Report Status 03/14/2020 FINAL  Final  SARS Coronavirus 2 by RT PCR (hospital order, performed in [facility] hospital lab) Nasopharyngeal Nasopharyngeal Swab     Status: None   Collection Time: 03/09/20 12:55 AM   Specimen: Nasopharyngeal Swab  Result Value Ref Range Status   SARS Coronavirus 2 NEGATIVE NEGATIVE Final    Comment: (NOTE) SARS-CoV-2 target nucleic acids are NOT DETECTED.  The SARS-CoV-2 RNA is generally detectable in upper and lower respiratory specimens during the acute phase of infection. The lowest concentration of SARS-CoV-2 viral copies this assay can detect is 250 copies / mL. A negative result does not preclude SARS-CoV-2 infection and should not be used as the sole basis for treatment or other patient management decisions.  A negative result may occur with improper specimen collection / handling, submission of specimen other than nasopharyngeal swab, presence of viral mutation(s) within the areas targeted by this assay, and inadequate number of viral copies (<250 copies / mL). A negative result must be combined with clinical observations, patient history, and epidemiological information.  Fact Sheet for Patients:   StrictlyIdeas.no  Fact Sheet for Healthcare Providers: BankingDealers.co.za  This test is not yet approved or  cleared by the [place] FDA and has been authorized for detection and/or diagnosis of SARS-CoV-2 by FDA under an Emergency Use Authorization (EUA).  This EUA will remain in effect (meaning this test can be used) for the duration of the COVID-19 declaration under Section 564(b)(1) of the Act, 21 U.S.C. section 360bbb-3(b)(1), unless the authorization is terminated or revoked sooner.  Performed at [facility], [address],  [place], [place] [address]   Aerobic/Anaerobic Culture (surgical/deep wound)     Status: None   Collection Time: 03/09/20  5:28 PM   Specimen: Wound; Abscess  Result Value Ref Range Status   Specimen Description WOUND RIGHT FOOT  Final   Special Requests NONE  Final   Gram Stain   Final    RARE WBC PRESENT, PREDOMINANTLY PMN NO ORGANISMS SEEN    Culture   Final    FEW METHICILLIN RESISTANT STAPHYLOCOCCUS AUREUS NO ANAEROBES ISOLATED Performed at [facility], [address] [place], [place] [address]    Report Status 03/14/2020 FINAL  Final   Organism ID, Bacteria METHICILLIN RESISTANT STAPHYLOCOCCUS AUREUS  Final      Susceptibility   Methicillin resistant staphylococcus aureus - MIC*    CIPROFLOXACIN >=8 RESISTANT Resistant     ERYTHROMYCIN >=8 RESISTANT Resistant     GENTAMICIN <=0.5 SENSITIVE Sensitive     OXACILLIN >=4 RESISTANT Resistant     TETRACYCLINE <=1 SENSITIVE Sensitive     VANCOMYCIN <=0.5 SENSITIVE Sensitive     TRIMETH/SULFA 80 RESISTANT Resistant     CLINDAMYCIN <=0.25 SENSITIVE Sensitive     RIFAMPIN <=0.5 SENSITIVE Sensitive     Inducible Clindamycin NEGATIVE Sensitive     * FEW METHICILLIN RESISTANT STAPHYLOCOCCUS AUREUS  Aerobic/Anaerobic Culture (surgical/deep wound)     Status: None   Collection Time: 03/10/20  8:03 PM   Specimen: PATH Other; Wound  Result Value Ref Range Status   Specimen Description   Final    ABSCESS Performed at [facility], [address] [place], [place] [address]    Special Requests DEEP  Final   Gram Stain   Final    FEW WBC PRESENT, PREDOMINANTLY PMN FEW GRAM POSITIVE COCCI IN PAIRS IN CLUSTERS    Culture   Final    ABUNDANT METHICILLIN RESISTANT STAPHYLOCOCCUS AUREUS NO ANAEROBES ISOLATED Performed at [facility], [address] [place], [place] [address]    Report Status 03/16/2020 FINAL  Final   Organism ID, Bacteria METHICILLIN RESISTANT STAPHYLOCOCCUS AUREUS  Final      Susceptibility    Methicillin resistant staphylococcus aureus - MIC*    CIPROFLOXACIN >=8 RESISTANT Resistant     ERYTHROMYCIN >=8 RESISTANT Resistant     GENTAMICIN <=0.5 SENSITIVE Sensitive     OXACILLIN >=4 RESISTANT Resistant     TETRACYCLINE <=1 SENSITIVE Sensitive     VANCOMYCIN <=0.5 SENSITIVE Sensitive     TRIMETH/SULFA 160 RESISTANT Resistant     CLINDAMYCIN <=0.25 SENSITIVE Sensitive     RIFAMPIN <=0.5 SENSITIVE Sensitive     Inducible Clindamycin NEGATIVE Sensitive     * ABUNDANT METHICILLIN RESISTANT STAPHYLOCOCCUS AUREUS  MRSA PCR Screening     Status: None   Collection Time: 03/14/20  1:36 PM   Specimen: Nasopharyngeal  Result Value Ref Range Status   MRSA by PCR NEGATIVE NEGATIVE Final    Comment:        The GeneXpert MRSA Assay (FDA approved for NASAL specimens only), is one component of a comprehensive MRSA colonization surveillance program. It is not intended to diagnose MRSA infection nor to guide or monitor treatment for MRSA infections. Performed at [facility], [address], [place], [place] [address]          Radiology Studies: No results found.      Scheduled Meds: . aspirin EC  81 mg Oral Daily  . atorvastatin  40 mg Oral QHS  . calcium-vitamin D  1 tablet Oral Q breakfast  . cholecalciferol  1,000 Units Oral Daily  . clopidogrel  75 mg Oral Q breakfast  . enoxaparin (LOVENOX) injection  40 mg Subcutaneous Q24H  . insulin aspart  0-5 Units Subcutaneous QHS  . insulin aspart  0-9 Units Subcutaneous TID WC  . insulin detemir  10 Units Subcutaneous QHS  . linagliptin  5 mg Oral Daily  . lisinopril  2.5 mg Oral Daily  . pantoprazole  40 mg Oral Daily  . sodium chloride flush  3 mL Intravenous Q12H  . terbinafine   Topical BID  . vitamin B-12  500 mcg Oral Daily  . zonisamide  100 mg Oral Daily   Continuous Infusions: . sodium chloride 200 mL/hr at 03/15/20 0758  . 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 S/p ID today by Dr. Cleda Mccreedy -ID following , iv vanco d/c'd , started on Linozolid 600mg  bid x3 weeks Abscess culture MRSA, wound culture MRSA  Per podiatry patient is cleared for discharge with oral antibiotics.  Patient will be nonweightbearing to her right lower extremity.  Also needs dressing change 3 times daily a week with bulky gauze bandage. Will need home health Follow-up with podiatry in 2 weeks  Iron deficiency anemia-mild. She is mild iron deficiecy contributing to this.  No evidence of hemolysis and folate levels are normal Hg 6.9 today Give 1 unit PRBC Hematology's input was appreciated-SPEP is pending  We will monitor CBC maintain hemoglobin greater than 7 We will check occult stool Will likely need colonoscopy as outpatient in the near future   Confusion/Encephalopathy-  Improved and appropriate This happened in setting of multifocal pneumonia and worsening respiratory status     Uncontrolled Hyperglycemia in the setting of Type 2 Diabetes Mellitus -Patient hemoglobin A1c was 9.7 Blood glucose levels stable. 9/17 patient refused her NovoLog. Continue RISS, Tradjenta, hypoglycemic protocol    EssentialHypertension. -Stable, continue lisinopril 2.5 mg daily     Hyperlipidemia Continue statins  GERD. Continue PPI  Hyponatremia/Hypochloremia Improved, sodium 136 Monitor periodically   Loose Stools -In the setting of Antibiotics Improved Leukocytosis improving, afebrile. Continue holding  laxatives  Leukocytosis trending down, likely from cellulitis/wound infection of Rt foot Hold laxatives.      Hypokalemia Stable, K 3.9 Continue to monitor  Thrombocytosis -Patient's platelet count on admission was 570 and increased. Heme's input was appreciated-this is likely to her iron deficiency anemia JAK2 mutation was ordered for completeness   Mild AKI Metabolic acidosis, mild -Patient's BUNs/creatinine on admission was 18/1.1 Improved now. AG nml. Avoid nephrotoxic medications, contrast dyes, and hypotension     DiabeticPeripheralNeuropathy. -Continue with duloxetine     History ofSeizures -Continue with zonisamide  Has had a childhood febrile seizure and questionable if eclampsia     Hypomagnesemia Supplemented, magnesium 1.7 today Continue to monitor periodically  DVT prophylaxis: Lovenox Code Status: Full  family Communication: None at bedside  Status is: Inpatient  Remains inpatient appropriate because:Ongoing diagnostic testing needed not appropriate for outpatient work up   Dispo:  The patient is from: Home              Anticipated d/c is to: home              Anticipated d/c date is: 2              Patient currently is not medically stable to d/c.requiring blood transfusion.            LOS: 7 days   Time spent: 45 min with >50% on coc    Nolberto Hanlon, MD [facility] Pager [phone]  If 7PM-7AM, please contact night-coverage [email or web address] Password [email or web address] 03/16/2020, 2:15 PM

## 2020-03-16 NOTE — Plan of Care (Addendum)
Pt was able to use walker with one foot to get to bathroom this shift. Hgb 6.9, type and screen to be completed this am. Problem: Education: Goal: Knowledge of General Education information will improve Description: Including pain rating scale, medication(s)/side effects and non-pharmacologic comfort measures Outcome: Progressing   Problem: Health Behavior/Discharge Planning: Goal: Ability to manage health-related needs will improve Outcome: Progressing   Problem: Clinical Measurements: Goal: Ability to maintain clinical measurements within normal limits will improve Outcome: Progressing Goal: Will remain free from infection Outcome: Progressing Goal: Diagnostic test results will improve Outcome: Progressing Goal: Respiratory complications will improve Outcome: Progressing Goal: Cardiovascular complication will be avoided Outcome: Progressing   Problem: Activity: Goal: Risk for activity intolerance will decrease Outcome: Progressing   Problem: Nutrition: Goal: Adequate nutrition will be maintained Outcome: Progressing   Problem: Coping: Goal: Level of anxiety will decrease Outcome: Progressing   Problem: Elimination: Goal: Will not experience complications related to bowel motility Outcome: Progressing Goal: Will not experience complications related to urinary retention Outcome: Progressing   Problem: Pain Managment: Goal: General experience of comfort will improve Outcome: Progressing   Problem: Safety: Goal: Ability to remain free from injury will improve Outcome: Progressing   Problem: Skin Integrity: Goal: Risk for impaired skin integrity will decrease Outcome: Progressing

## 2020-03-16 NOTE — Progress Notes (Signed)
Patients husband got patient dressed and in w/c and patient crying to see " sister in law"/. Per ADON, patient can have short visit with sister in law, however boyfriend had to leave .

## 2020-03-16 NOTE — Plan of Care (Signed)

## 2020-03-16 NOTE — Progress Notes (Signed)
Patient refused plavix and aspirin , states " My blood is thin enough".

## 2020-03-16 NOTE — Evaluation (Signed)
Physical Therapy Evaluation Patient Details Name: ADALAE BAYSINGER MRN: [id number] DOB: [birth year] Today's Date: 03/16/2020   History of Present Illness  Per MD notes: Pt is a 44 year old female with a past medical history significant for diabetes mellitus type II and history of seizure disorder who presented to the emergency room with acute onset of worsening right foot swelling associated with erythema, pain and tenderness.  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 Diabetic Peripheral Neuropathy, and Hypomagnesemia.    Clinical Impression   Pt was pleasant and motivated to participate during the session.  Pt required extra time and effort with bed mobility tasks but did not require physical assistance.  Pt required visual and verbal cues for sequencing with transfers and amb as well as physical assistance to prevent LOB.  Pt was only able to amb a max of 4' with hop-to pattern before requiring assistance to safely return to sitting to prevent LOB.  Pt was able to maintain compliance with R foot NWB status during both transfers and gait but would be at a very high risk of non-compliance without skilled assistance. Pt will benefit from PT services in a SNF setting upon discharge to safely address deficits listed in patient problem list for decreased caregiver assistance and eventual return to PLOF.   Patient suffers from R foot cellulitis/osteomyelitis and is NWB through the R foot which impairs her ability to perform daily activities like toileting, feeding, dressing, grooming, bathing in the home. A cane, walker, crutch will not resolve the patient's issue with performing activities of  daily living. A lightweight wheelchair and cushion is required/recommended and will allow patient to safely perform daily activities. Patient can safely propel the wheelchair in the home or has a caregiver who can provide assistance.       Follow Up Recommendations SNF    Equipment Recommendations  Rolling walker with 5" wheels;3in1 (PT);Wheelchair (measurements PT)    Recommendations for Other Services       Precautions / Restrictions Precautions Precautions: Fall Restrictions Weight Bearing Restrictions: Yes RLE Weight Bearing: Non weight bearing      Mobility  Bed Mobility Overal bed mobility: Modified Independent             General bed mobility comments: Extra time and effort but no physical assistance required  Transfers Overall transfer level: Needs assistance Equipment used: Rolling walker (2 wheeled) Transfers: Sit to/from Stand Sit to Stand: Min assist         General transfer comment: Min A for stability with mod verbal cues for NWB status compliance on the RLE  Ambulation/Gait Ambulation/Gait assistance: Min assist;Mod assist Gait Distance (Feet): 4 Feet Assistive device: Rolling walker (2 wheeled)   Gait velocity: decreased   General Gait Details: Hop-to pattern with pt only able to amb a max of 4 feet before requiring to return to sitting; pt able to maintain RLE NWB status compliance througout  Stairs            Wheelchair Mobility    Modified Rankin (Stroke Patients Only)       Balance Overall balance assessment: Needs assistance   Sitting balance-Leahy Scale: Normal     Standing balance support: Bilateral upper extremity supported;During functional activity Standing balance-Leahy Scale: Poor Standing balance comment: Physical assist required  to prevent LOB during hop-to gait with RW                             Pertinent Vitals/Pain Pain Assessment: 0-10 Pain Score: 9  Pain Location: R foot Pain Descriptors /  Indicators: Aching;Sore Pain Intervention(s): Premedicated before session;Monitored during session    [place] expects to be discharged to:: Private residence Living Arrangements: Spouse/significant other Available Help at Discharge: Family;Available PRN/intermittently Type of Home: Mobile home Home Access: Ramped entrance     Home Layout: One level Home Equipment: Shower seat      Prior Function Level of Independence: Independent         Comments: Ind amb community distances without an AD, 3 falls in the last 6 months secondary to "I can't feel my feet", Ind with ADLs     Hand Dominance        Extremity/Trunk Assessment   Upper Extremity Assessment Upper Extremity Assessment: Overall WFL for tasks assessed    Lower Extremity Assessment Lower Extremity Assessment: Generalized weakness       Communication      Cognition Arousal/Alertness: Awake/alert Behavior During Therapy: WFL for tasks assessed/performed Overall Cognitive Status: Within Functional Limits for tasks assessed                                        General Comments      Exercises Total Joint Exercises Ankle Circles/Pumps: AROM;Strengthening;Both;10 reps Quad Sets: Strengthening;Both;10 reps Gluteal Sets: Strengthening;Both;10 reps Hip ABduction/ADduction: AROM;Strengthening;Both;5 reps Straight Leg Raises: AROM;Strengthening;Both;5 reps Long Arc Quad: Strengthening;Both;10 reps Other Exercises Other Exercises: HEP education for BLE APs, QS, GS, and LAQs x 10 each every 1-2 hours   Assessment/Plan    PT Assessment Patient needs continued PT services  PT Problem List Decreased strength;Decreased activity tolerance;Decreased balance;Decreased mobility;Decreased knowledge of use of DME;Decreased safety awareness;Decreased knowledge of precautions;Pain       PT Treatment Interventions DME instruction;Gait training;Functional mobility training;Therapeutic  activities;Therapeutic exercise;Balance training;Patient/family education    PT Goals (Current goals can be found in the Care Plan section)  Acute Rehab PT Goals Patient Stated Goal: To get back home PT Goal Formulation: With patient Time For Goal Achievement: 03/29/20 Potential to Achieve Goals: Good    Frequency Min 2X/week   Barriers to discharge Inaccessible home environment;Decreased caregiver support      Co-evaluation               AM-PAC PT "6 Clicks" Mobility  Outcome Measure Help needed turning from your back to your side while in a flat bed without using bedrails?: A Little Help needed moving from lying on your back to sitting on the side of a flat bed without using bedrails?: A Little Help needed moving to and from a bed to a chair (including a wheelchair)?: A Little Help needed standing up from a chair using your arms (e.g., wheelchair or bedside chair)?: A Little Help needed to walk in hospital room?: A Lot Help needed climbing 3-5 steps with a railing? : Total 6 Click Score: 15    End of Session Equipment Utilized During Treatment: Gait belt Activity Tolerance: Patient tolerated treatment well Patient left: in bed;with call bell/phone within reach;with bed alarm set Nurse Communication: Mobility status PT Visit Diagnosis: Unsteadiness on feet (R26.81);History of falling (Z91.81);Muscle weakness (generalized) (M62.81);Difficulty in walking, not elsewhere  classified (R26.2);Pain Pain - Right/Left: Right Pain - part of body: Ankle and joints of foot    Time: 4627-0350 PT Time Calculation (min) (ACUTE ONLY): 40 min   Charges:   PT Evaluation $PT Eval Moderate Complexity: 1 Mod PT Treatments $Therapeutic Exercise: 8-22 mins        D. Royetta Asal PT, DPT 03/16/20, 5:11 PM

## 2020-03-16 NOTE — TOC Initial Note (Signed)
Transition of Care [facility]) - Initial/Assessment Note    Patient Details  Name: Kristy Mcmillan MRN: [id number] Date of Birth: [birth year]  Transition of Care [facility]) CM/SW Contact:    Shelbie Ammons, RN Phone Number: 03/16/2020, 3:00 PM  Clinical Narrative:     RNCM assessed patient by telephone, discussed with patient that podiatrist is recommending home health services. Patient reports that she would be agreeable to this as she has never had this in the past. Patient reports that she has no equipment in the home, only a ramp to get in. Discussed that PT would be evaluating to see if she needs any equipment or what services she needs. Patient is agreeable to setting up home health and does not have preference as to which agency as long as they accept her insurance.              Expected Discharge Plan: [place] Barriers to Discharge: Continued Medical Work up   Patient Goals and CMS Choice        Expected Discharge Plan and Services Expected Discharge Plan: [place] Choice: [place] arrangements for the past 2 months: Single Family Home                                      Prior Living Arrangements/Services Living arrangements for the past 2 months: Single Family Home Lives with:: Self Patient language and need for interpreter reviewed:: Yes        Need for Family Participation in Patient Care: Yes (Comment) Care giver support system in place?: Yes (comment)   Criminal Activity/Legal Involvement Pertinent to Current Situation/Hospitalization: No - Comment as needed  Activities of Daily Living   ADL Screening (condition at time of admission) Patient's cognitive ability adequate to safely complete daily activities?: No Patient able to express need for assistance with ADLs?: Yes Independently performs ADLs?: Yes (appropriate for developmental age)  Permission Sought/Granted                   Emotional Assessment Appearance:: Appears stated age     Orientation: : Oriented to Self, Oriented to Place, Oriented to  Time, Oriented to Situation Alcohol / Substance Use: Not Applicable Psych Involvement: No (comment)  Admission diagnosis:  Cellulitis of right foot [L03.115] Lower extremity cellulitis [L03.119] Toe necrosis (HCC) [I96] Sepsis, due to unspecified organism, unspecified whether acute organ dysfunction present [facility]) [A41.9] Patient Active Problem List   Diagnosis Date Noted  . Lower extremity cellulitis 03/09/2020  . Neuropathy due to secondary diabetes mellitus ([place]) 11/04/2018  . Pain in joint, lower leg 11/04/2018  . PAD (peripheral artery disease) ([place]) 11/04/2018  . Morbid obesity ([place]) 07/19/2013  . IUFD (intrauterine fetal death) 2013-03-29  . GERD (gastroesophageal reflux disease) 12/01/2012  . Tick bite of abdomen 12/01/2012  . Previous cesarean section complicating pregnancy, antepartum condition or complication 35/45/6256  . Headache in pregnancy, antepartum 10/25/2012  . DM (diabetes mellitus), type 1 ([place]) 09/22/2012  . Seizure disorder ([place]) 09/22/2012   PCP:  [place], [place]:   [id number] [place], [address] [place] [address] Phone: [phone] Fax: [phone]  [facility] [id number] [place], [place] [address] Phone: [phone] Fax: [phone]  [id number] [address], [place] [address] Phone: [phone] Fax: [phone]  [id number] [place], [place] - [address] [place] [address] [place] [address] Phone: [phone] Fax: [phone]     Social Determinants of Health (SDOH) Interventions    Readmission Risk Interventions No flowsheet data found.

## 2020-03-16 NOTE — Progress Notes (Signed)
Education provided to patient about refusing her Novolog, She is on [place], however it was explained to her that she needs the SSI as well, patient verbalized understanding and then proceeded to refuse again

## 2020-03-16 NOTE — Progress Notes (Signed)
Patient is refusing insulin , states that it makes her sick on her stomach , and gives her diarrhea

## 2020-03-16 NOTE — Progress Notes (Signed)
Patients husband came up to this Probation officer with others around stating " Get the discharge papers ready for my wife". Notified Dr. Kurtis Bushman and Charge nurse , he was here this morning, so he is upset that his " Sister in law " is not allowed to visit .

## 2020-03-16 NOTE — Progress Notes (Signed)
Pt refuses insulins, states they make her sick and she needs a different type. Zofran given for nausea, but pt states she gets hot and has diarrhea as well.

## 2020-03-17 DIAGNOSIS — D508 Other iron deficiency anemias: Secondary | ICD-10-CM

## 2020-03-17 LAB — GLUCOSE, CAPILLARY
Glucose-Capillary: 118 mg/dL — ABNORMAL HIGH (ref 70–99)
Glucose-Capillary: 124 mg/dL — ABNORMAL HIGH (ref 70–99)
Glucose-Capillary: 145 mg/dL — ABNORMAL HIGH (ref 70–99)
Glucose-Capillary: 185 mg/dL — ABNORMAL HIGH (ref 70–99)

## 2020-03-17 LAB — BPAM RBC
Blood Product Expiration Date: 202110092359
ISSUE DATE / TIME: 202109171656
Unit Type and Rh: 5100

## 2020-03-17 LAB — TYPE AND SCREEN
ABO/RH(D): O POS
Antibody Screen: NEGATIVE
Unit division: 0

## 2020-03-17 LAB — CBC
HCT: 26.4 % — ABNORMAL LOW (ref 36.0–46.0)
Hemoglobin: 8.4 g/dL — ABNORMAL LOW (ref 12.0–15.0)
MCH: 27 pg (ref 26.0–34.0)
MCHC: 31.8 g/dL (ref 30.0–36.0)
MCV: 84.9 fL (ref 80.0–100.0)
Platelets: 590 10*3/uL — ABNORMAL HIGH (ref 150–400)
RBC: 3.11 MIL/uL — ABNORMAL LOW (ref 3.87–5.11)
RDW: 13.6 % (ref 11.5–15.5)
WBC: 12.8 10*3/uL — ABNORMAL HIGH (ref 4.0–10.5)
nRBC: 0 % (ref 0.0–0.2)

## 2020-03-17 LAB — HAPTOGLOBIN: Haptoglobin: 475 mg/dL — ABNORMAL HIGH (ref 42–296)

## 2020-03-17 MED ORDER — ASPIRIN 81 MG PO TBEC
81.0000 mg | DELAYED_RELEASE_TABLET | Freq: Every day | ORAL | 1 refills | Status: AC
Start: 2020-03-18 — End: ?

## 2020-03-17 MED ORDER — TERBINAFINE HCL 1 % EX CREA: TOPICAL_CREAM | Freq: Two times a day (BID) | CUTANEOUS | 0 refills | Status: DC

## 2020-03-17 MED ORDER — CLOPIDOGREL BISULFATE 75 MG PO TABS
75.0000 mg | ORAL_TABLET | Freq: Every day | ORAL | 1 refills | Status: DC
Start: 2020-03-18 — End: 2020-07-09

## 2020-03-17 MED ORDER — FERROUS SULFATE 325 (65 FE) MG PO TABS: 325.0000 mg | ORAL_TABLET | Freq: Every day | ORAL | 1 refills | Status: AC

## 2020-03-17 MED ORDER — OXYCODONE-ACETAMINOPHEN 5-325 MG PO TABS
1.0000 | ORAL_TABLET | ORAL | 0 refills | Status: DC | PRN
Start: 2020-03-17 — End: 2020-07-03

## 2020-03-17 NOTE — TOC Transition Note (Addendum)
Transition of Care [facility]) - CM/SW Discharge Note   Patient Details  Name: Kristy Mcmillan MRN: [id number] Date of Birth: May 28, 1976  Transition [facility]) CM/SW Contact:  Boris Sharper, LCSW Phone Number: 03/17/2020, 10:04 AM   Clinical Narrative:    PT is medically stable for discharge per MD. Pt decided to go [facility] instead of SNF. [facility] PT, OT and RN. Pt has no DME and DME has been ordered through [facility]. Jermaine will deliver 3n1, wheelchair and rolling walker to pt's room. Pt's spouse will be picking her up and transporting home.   Final next level of care: [place] Barriers to Discharge: No Barriers Identified   Patient Goals and CMS Choice        Discharge Placement                Patient to be transferred to facility by: husband Name of family member notified: Lolita Patella Patient and family notified of of transfer: 03/17/20  Discharge Plan and Services     Post Acute Care Choice: Home Health          DME Arranged: 3-N-1, Walker rolling, Wheelchair manual DME Agency: Other - Comment ([place]) Date DME Agency Contacted: 03/17/20 Time DME Agency Contacted: 0045 Representative spoke with at DME Agency: Melene Muller HH Arranged: RN, PT, OT [facility] Agency: [place] ([place]) Date [place]: 03/17/20 Time [place]: 9977 Representative spoke with at [place]: [place] (SDOH) Interventions     Readmission Risk Interventions No flowsheet data found.

## 2020-03-17 NOTE — Plan of Care (Signed)
Continuing with plan of care. 

## 2020-03-17 NOTE — Plan of Care (Signed)
Discharge teaching completed with patient who is in stable condition. 

## 2020-03-17 NOTE — Discharge Summary (Signed)
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 SARS-CoV-2 by FDA under an Emergency Use Authorization (EUA).  This EUA will remain in effect (meaning this test can be used) for the duration of the COVID-19 declaration under Section 564(b)(1) of the Act, 21 U.S.C. section 360bbb-3(b)(1), unless the authorization is terminated or revoked sooner.  Performed at [facility], [address], [place],  [address]   Aerobic/Anaerobic Culture  (surgical/deep wound)     Status: None   Collection Time: 03/09/20  5:28 PM   Specimen: Wound; Abscess  Result Value Ref Range Status   Specimen Description WOUND RIGHT FOOT  Final   Special Requests NONE  Final  Gram Stain   Final    RARE WBC PRESENT, PREDOMINANTLY PMN NO ORGANISMS SEEN    Culture   Final    FEW METHICILLIN RESISTANT STAPHYLOCOCCUS AUREUS NO ANAEROBES ISOLATED Performed at [facility], [place] [address] [place], [place] [address]    Report Status 03/14/2020 FINAL  Final   Organism ID, Bacteria METHICILLIN RESISTANT STAPHYLOCOCCUS AUREUS  Final      Susceptibility   Methicillin resistant staphylococcus aureus - MIC*    CIPROFLOXACIN >=8 RESISTANT Resistant     ERYTHROMYCIN >=8 RESISTANT Resistant     GENTAMICIN <=0.5 SENSITIVE Sensitive     OXACILLIN >=4 RESISTANT Resistant     TETRACYCLINE <=1 SENSITIVE Sensitive     VANCOMYCIN <=0.5 SENSITIVE Sensitive     TRIMETH/SULFA 80 RESISTANT Resistant     CLINDAMYCIN <=0.25 SENSITIVE Sensitive     RIFAMPIN <=0.5 SENSITIVE Sensitive     Inducible Clindamycin NEGATIVE Sensitive     * FEW METHICILLIN RESISTANT STAPHYLOCOCCUS AUREUS  Aerobic/Anaerobic Culture (surgical/deep wound)     Status: None   Collection Time: 03/10/20  8:03 PM   Specimen: PATH Other; Wound  Result Value Ref Range Status   Specimen Description   Final    ABSCESS Performed at [facility], [address] [place], [place] [address]    Special Requests DEEP  Final   Gram Stain   Final    FEW WBC PRESENT, PREDOMINANTLY PMN FEW GRAM POSITIVE COCCI IN PAIRS IN CLUSTERS    Culture   Final    ABUNDANT METHICILLIN RESISTANT STAPHYLOCOCCUS AUREUS NO ANAEROBES ISOLATED Performed at [facility], [address], [place], [place] [address]    Report Status 03/16/2020 FINAL  Final   Organism ID, Bacteria METHICILLIN RESISTANT STAPHYLOCOCCUS AUREUS  Final      Susceptibility   Methicillin resistant staphylococcus aureus - MIC*     CIPROFLOXACIN >=8 RESISTANT Resistant     ERYTHROMYCIN >=8 RESISTANT Resistant     GENTAMICIN <=0.5 SENSITIVE Sensitive     OXACILLIN >=4 RESISTANT Resistant     TETRACYCLINE <=1 SENSITIVE Sensitive     VANCOMYCIN <=0.5 SENSITIVE Sensitive     TRIMETH/SULFA 160 RESISTANT Resistant     CLINDAMYCIN <=0.25 SENSITIVE Sensitive     RIFAMPIN <=0.5 SENSITIVE Sensitive     Inducible Clindamycin NEGATIVE Sensitive     * ABUNDANT METHICILLIN RESISTANT STAPHYLOCOCCUS AUREUS  MRSA PCR Screening     Status: None   Collection Time: 03/14/20  1:36 PM   Specimen: Nasopharyngeal  Result Value Ref Range Status   MRSA by PCR NEGATIVE NEGATIVE Final    Comment:        The GeneXpert MRSA Assay (FDA approved for NASAL specimens only), is one component of a comprehensive MRSA colonization surveillance program. It is not intended to diagnose MRSA infection nor to guide or monitor treatment for MRSA infections. Performed at [facility], [address] [place], [place] [address]      Labs: BNP (last 3 results) No results for input(s): BNP in the last 8760 hours. Basic Metabolic Panel: Recent Labs  Lab 03/11/20 0911 03/11/20 0911 03/12/20 0117 03/12/20 0649 03/13/20 0656 03/14/20 0430 03/16/20 0428  NA 133*   < > 133* 132* 133* 134* 136  K 4.4   < > 4.3 4.0 4.1 3.9 3.9  CL 102   < > 103 102 101 104 104  CO2 20*   < > 22 19* 22 19* 22  GLUCOSE 145*   < >  106* 112* 147* 134* 164*  BUN 17   < > 20 19 18 13 11   CREATININE 0.96   < > 0.99 0.93 1.01* 0.97 0.80  CALCIUM 8.0*   < > 8.3* 8.0* 8.1* 8.1* 7.7*  MG 1.9  --  1.8  --  1.8 1.6* 1.7  PHOS 3.7  --  3.7  --  2.8 3.1  --    < > = values in this interval not displayed.   Liver Function Tests: Recent Labs  Lab 03/11/20 0911 03/12/20 0117 03/13/20 0656 03/14/20 0430  AST 12* 11* 12* 9*  ALT 8 9 8 7   ALKPHOS 131* 161* 139* 116  BILITOT 0.7 0.7 0.4 0.5  PROT 7.0 6.9 7.2 6.8  ALBUMIN 1.8* 1.6* 1.7* 1.7*   No results for  input(s): LIPASE, AMYLASE in the last 168 hours. Recent Labs  Lab 03/13/20 0656  AMMONIA 16   CBC: Recent Labs  Lab 03/11/20 0911 03/11/20 0911 03/12/20 0117 03/12/20 0117 03/13/20 0656 03/14/20 0430 03/15/20 0619 03/16/20 0428 03/17/20 0439  WBC 28.4*   < > 24.2*   < > 19.5* 17.5* 16.8* 13.8* 12.8*  NEUTROABS 23.8*  --  18.9*  --  13.6* 12.0*  --  8.5*  --   HGB 8.2*   < > 7.6*   < > 8.3* 8.0* 8.1* 6.9* 8.4*  HCT 24.9*   < > 22.9*   < > 25.0* 24.1* 25.1* 22.0* 26.4*  MCV 84.7   < > 83.6   < > 85.0 83.7 85.4 87.0 84.9  PLT 530*   < > 567*   < > 637* 673* 701* 574* 590*   < > = values in this interval not displayed.   Cardiac Enzymes: No results for input(s): CKTOTAL, CKMB, CKMBINDEX, TROPONINI in the last 168 hours. BNP: Invalid input(s): POCBNP CBG: Recent Labs  Lab 03/16/20 2124 03/17/20 0033 03/17/20 0432 03/17/20 0738 03/17/20 1135  GLUCAP 160* 185* 145* 118* 124*   D-Dimer No results for input(s): DDIMER in the last 72 hours. Hgb A1c No results for input(s): HGBA1C in the last 72 hours. Lipid Profile No results for input(s): CHOL, HDL, LDLCALC, TRIG, CHOLHDL, LDLDIRECT in the last 72 hours. Thyroid function studies No results for input(s): TSH, T4TOTAL, T3FREE, THYROIDAB in the last 72 hours.  Invalid input(s): FREET3 Anemia work up Recent Labs    03/16/20 0914  VITAMINB12 439  FOLATE 10.7  FERRITIN 165  TIBC 195*  IRON 22*   Urinalysis    Component Value Date/Time   COLORURINE STRAW (A) 06/24/2018 2306   APPEARANCEUR CLEAR (A) 06/24/2018 2306   APPEARANCEUR Hazy 10/23/2012 1807   LABSPEC 1.029 06/24/2018 2306   LABSPEC 1.011 10/23/2012 1807   PHURINE 5.0 06/24/2018 2306   GLUCOSEU >=500 (A) 06/24/2018 2306   GLUCOSEU >=500 10/23/2012 1807   HGBUR SMALL (A) 06/24/2018 2306   BILIRUBINUR NEGATIVE 06/24/2018 2306   BILIRUBINUR Negative 10/23/2012 1807   KETONESUR NEGATIVE 06/24/2018 2306   PROTEINUR 100 (A) 06/24/2018 2306   UROBILINOGEN  0.2 10/25/2012 0936   NITRITE NEGATIVE 06/24/2018 2306   LEUKOCYTESUR SMALL (A) 06/24/2018 2306   LEUKOCYTESUR Negative 10/23/2012 1807   Sepsis Labs Invalid input(s): PROCALCITONIN,  WBC,  LACTICIDVEN Microbiology Recent Results (from the past 240 hour(s))  Blood culture (routine x 2)     Status: None   Collection Time: 03/08/20  7:26 PM   Specimen: BLOOD  Result Value Ref Range Status   Specimen Description BLOOD RIGHT ANTECUBITAL  Final   Special Requests   Final    BOTTLES DRAWN AEROBIC AND ANAEROBIC Blood Culture results may not be optimal due to an excessive volume of blood received in culture bottles   Culture   Final    NO GROWTH 5 DAYS Performed at [facility], [address] [place], [place] [address]    Report Status 03/13/2020 FINAL  Final  Blood culture (routine x 2)     Status: None   Collection Time: 03/09/20 12:55 AM   Specimen: BLOOD  Result Value Ref Range Status   Specimen Description BLOOD LEFT [facility]  Final   Special Requests   Final    BOTTLES DRAWN AEROBIC AND ANAEROBIC Blood Culture adequate volume   Culture   Final    NO GROWTH 5 DAYS Performed at [facility], [address] [place], [place] [address]    Report Status 03/14/2020 FINAL  Final  SARS Coronavirus 2 by RT PCR (hospital order, performed in [facility] hospital lab) Nasopharyngeal Nasopharyngeal Swab     Status: None   Collection Time: 03/09/20 12:55 AM   Specimen: Nasopharyngeal Swab  Result Value Ref Range Status   SARS Coronavirus 2 NEGATIVE NEGATIVE Final    Comment: (NOTE) SARS-CoV-2 target nucleic acids are NOT DETECTED.  The SARS-CoV-2 RNA is generally detectable in upper and lower respiratory specimens during the acute phase of infection. The lowest concentration of SARS-CoV-2 viral copies this assay can detect is 250 copies / mL. A negative result does not preclude SARS-CoV-2 infection and should not be used as the sole basis for treatment or other patient  management decisions.  A negative result may occur with improper specimen collection / handling, submission of specimen other than nasopharyngeal swab, presence of viral mutation(s) within the areas targeted by this assay, and inadequate number of viral copies (<250 copies / mL). A negative result must be combined with clinical observations, patient history, and epidemiological information.  Fact Sheet for Patients:   StrictlyIdeas.no  Fact Sheet for Healthcare Providers: BankingDealers.co.za  This test is not yet approved or  cleared by the [place] FDA and has been authorized for detection and/or diagnosis of SARS-CoV-2 by FDA under an Emergency Use Authorization (EUA).  This EUA will remain in effect (meaning this test can be used) for the duration of the COVID-19 declaration under Section 564(b)(1) of the Act, 21 U.S.C. section 360bbb-3(b)(1), unless the authorization is terminated or revoked sooner.  Performed at [facility], [address] [place], [place] [address]   Aerobic/Anaerobic Culture (surgical/deep wound)     Status: None   Collection Time: 03/09/20  5:28 PM   Specimen: Wound; Abscess  Result Value Ref Range Status   Specimen Description WOUND RIGHT FOOT  Final   Special Requests NONE  Final   Gram Stain   Final    RARE WBC PRESENT, PREDOMINANTLY PMN NO ORGANISMS SEEN    Culture   Final    FEW METHICILLIN RESISTANT STAPHYLOCOCCUS AUREUS NO ANAEROBES ISOLATED Performed at [facility], [address], [place], [place] [address]    Report Status 03/14/2020 FINAL  Final   Organism ID, Bacteria METHICILLIN RESISTANT STAPHYLOCOCCUS AUREUS  Final      Susceptibility   Methicillin resistant staphylococcus aureus - MIC*    CIPROFLOXACIN >=8 RESISTANT Resistant     ERYTHROMYCIN >=8 RESISTANT Resistant     GENTAMICIN <=0.5 SENSITIVE Sensitive     OXACILLIN >=4 RESISTANT Resistant     TETRACYCLINE  <=  1 SENSITIVE Sensitive     VANCOMYCIN <=0.5 SENSITIVE Sensitive     TRIMETH/SULFA 80 RESISTANT Resistant     CLINDAMYCIN <=0.25 SENSITIVE Sensitive     RIFAMPIN <=0.5 SENSITIVE Sensitive     Inducible Clindamycin NEGATIVE Sensitive     * FEW METHICILLIN RESISTANT STAPHYLOCOCCUS AUREUS  Aerobic/Anaerobic Culture (surgical/deep wound)     Status: None   Collection Time: 03/10/20  8:03 PM   Specimen: PATH Other; Wound  Result Value Ref Range Status   Specimen Description   Final    ABSCESS Performed at [facility], [address] [place], [place] [address]    Special Requests DEEP  Final   Gram Stain   Final    FEW WBC PRESENT, PREDOMINANTLY PMN FEW GRAM POSITIVE COCCI IN PAIRS IN CLUSTERS    Culture   Final    ABUNDANT METHICILLIN RESISTANT STAPHYLOCOCCUS AUREUS NO ANAEROBES ISOLATED Performed at [facility], [address] [place], [place] [address]    Report Status 03/16/2020 FINAL  Final   Organism ID, Bacteria METHICILLIN RESISTANT STAPHYLOCOCCUS AUREUS  Final      Susceptibility   Methicillin resistant staphylococcus aureus - MIC*    CIPROFLOXACIN >=8 RESISTANT Resistant     ERYTHROMYCIN >=8 RESISTANT Resistant     GENTAMICIN <=0.5 SENSITIVE Sensitive     OXACILLIN >=4 RESISTANT Resistant     TETRACYCLINE <=1 SENSITIVE Sensitive     VANCOMYCIN <=0.5 SENSITIVE Sensitive     TRIMETH/SULFA 160 RESISTANT Resistant     CLINDAMYCIN <=0.25 SENSITIVE Sensitive     RIFAMPIN <=0.5 SENSITIVE Sensitive     Inducible Clindamycin NEGATIVE Sensitive     * ABUNDANT METHICILLIN RESISTANT STAPHYLOCOCCUS AUREUS  MRSA PCR Screening     Status: None   Collection Time: 03/14/20  1:36 PM   Specimen: Nasopharyngeal  Result Value Ref Range Status   MRSA by PCR NEGATIVE NEGATIVE Final    Comment:        The GeneXpert MRSA Assay (FDA approved for NASAL specimens only), is one component of a comprehensive MRSA colonization surveillance program. It is not intended to  diagnose MRSA infection nor to guide or monitor treatment for MRSA infections. Performed at [facility], [address], [place], [place] [address]      Time coordinating discharge: Over 30 minutes  SIGNED:   Nolberto Hanlon, MD  [facility] 03/17/2020, 2:55 PM Pager   If 7PM-7AM, please contact night-coverage [email or web address] Password [email or web address]

## 2020-03-19 LAB — JAK2 GENOTYPR

## 2020-03-27 ENCOUNTER — Other Ambulatory Visit: Payer: Self-pay | Admitting: Oncology

## 2020-03-27 ENCOUNTER — Ambulatory Visit: Payer: Medicare Other | Admitting: Infectious Diseases

## 2020-03-27 DIAGNOSIS — D509 Iron deficiency anemia, unspecified: Secondary | ICD-10-CM

## 2020-03-28 ENCOUNTER — Telehealth (INDEPENDENT_AMBULATORY_CARE_PROVIDER_SITE_OTHER): Payer: Self-pay

## 2020-03-28 NOTE — Telephone Encounter (Signed)
Pt left a message on the nurses line an would like a refill om Percocet 5 MG for pain  Pt had a procedure  A few days a go .

## 2020-03-29 ENCOUNTER — Other Ambulatory Visit (INDEPENDENT_AMBULATORY_CARE_PROVIDER_SITE_OTHER): Payer: Self-pay | Admitting: Nurse Practitioner

## 2020-03-29 NOTE — Telephone Encounter (Signed)
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 She should contact podiatry to see if they will refill

## 2020-03-29 NOTE — Telephone Encounter (Signed)
I called the pt and left a VM making the pt aware of the NP instructions.

## 2020-03-29 NOTE — Progress Notes (Deleted)
[place]  [phone] [phone]) [phone]  ID: Kristy Mcmillan OB: [birth year]  MR#: [id number]  NOM#:[id number]  Patient Care Team: [facility], [facility] as PCP - General (General Practice)  CHIEF COMPLAINT: Iron deficiency anemia.  INTERVAL HISTORY: Patient is a 44 year old female who was initially admitted to the hospital with concern for sepsis consistent from an infection on her right foot.  She was also noted to have significant anemia as well as thrombocytosis.  She feels improved since admission, but not back to baseline.  She has no new neurologic complaints.  She has a fair appetite, but denies weight loss.  She has no chest pain, shortness of breath, cough, or hemoptysis.  She denies any nausea, vomiting, constipation, or diarrhea.  She has no urinary complaints.  She continues to have right foot pain.  Patient offers no further specific complaints today.  REVIEW OF SYSTEMS:   ROS  As per HPI. Otherwise, a complete review of systems is negative.  PAST MEDICAL HISTORY: Past Medical History:  Diagnosis Date  . Diabetes mellitus without complication ([place])   . Seizures ([place]) 2005 Last sz   childhood febrile sz, then ?eclampsia    PAST SURGICAL HISTORY: Past Surgical History:  Procedure Laterality Date  . AMPUTATION TOE Right 03/10/2020   Procedure: AMPUTATION FIFTH RAY WITH IRRIGATION AND DEBRIDEMENT RIGHT FOOT;  Surgeon: Sharlotte Alamo, DPM;  Location: [facility];  Service: Podiatry;  Laterality: Right;  . CESAREAN SECTION    . CESAREAN SECTION N/A 03/02/2013   Procedure: CESAREAN SECTION;  Surgeon: Florian Buff, MD;  Location: [place] [facility];  Service: Obstetrics;  Laterality: N/A;  Repeat Cesarean Section Delivery Nonviable Baby Girl @ 2158  . IRRIGATION AND DEBRIDEMENT FOOT Right 03/15/2020   Procedure: IRRIGATION AND DEBRIDEMENT FOOT;  Surgeon: Sharlotte Alamo, DPM;  Location: [facility];  Service: Podiatry;  Laterality: Right;  . LOWER EXTREMITY  ANGIOGRAPHY Right 03/13/2020   Procedure: Lower Extremity Angiography;  Surgeon: Katha Cabal, MD;  Location: [place] [facility];  Service: Cardiovascular;  Laterality: Right;    FAMILY HISTORY: Family History  Problem Relation Age of Onset  . Diabetes Mother   . Diabetes Father     ADVANCED DIRECTIVES (Y/N):  N  HEALTH MAINTENANCE: Social History   Tobacco Use  . Smoking status: Current Some Day Smoker  . Smokeless tobacco: Never Used  Substance Use Topics  . Alcohol use: No  . Drug use: No     Colonoscopy:  PAP:  Bone density:  Lipid panel:  Allergies  Allergen Reactions  . Dilantin [Phenytoin Sodium Extended] Other (See Comments)    Causes seizures  . Phenobarbital Other (See Comments)    Causes seizures Other reaction(s): Unknown Causes seizures  . Phenytoin Sodium Extended     Other reaction(s): Other (See Comments) Causes seizures  . Aspartame Diarrhea and Nausea And Vomiting  . Aspartame And Phenylalanine Diarrhea and Nausea And Vomiting  . Carbamazepine Other (See Comments)    Other reaction(s): Unknown  . Cymbalta [Duloxetine Hcl] Diarrhea  . Gabapentin Other (See Comments)    Other reaction(s): Unknown Feels like she is going to have a seizure  . Ciprofloxacin Rash    blisters  . Humalog [Insulin Lispro] Rash  . Lantus [Insulin Glargine] Rash  . Novolin 70-30 [Insulin Nph Isophane & Regular] Itching and Rash  . Penicillins Itching and Rash    Current Outpatient Medications  Medication Sig Dispense Refill  . albuterol (VENTOLIN HFA) 108 (90 Base)  MCG/ACT inhaler INHALE 2 PUFFS BY MOUTH INTO THE LUNGS EVERY 4 HOURS AS NEEDED    . amitriptyline (ELAVIL) 25 MG tablet TAKE 2 TABLETS AT NIGHT, INCREASE TO 3 TABLETS AFTER 2 WEEKS IF NEEDED    . aspirin EC 81 MG EC tablet Take 1 tablet (81 mg total) by mouth daily. Swallow whole. 30 tablet 1  . atorvastatin (LIPITOR) 40 MG tablet Take 40 mg by mouth at bedtime.    . calcium-vitamin D (OSCAL WITH  D) 500-200 MG-UNIT tablet Take 1 tablet by mouth.    . cholecalciferol (VITAMIN D) 25 MCG (1000 UT) tablet Take 1,000 Units by mouth daily.    . clopidogrel (PLAVIX) 75 MG tablet Take 1 tablet (75 mg total) by mouth daily with breakfast. 30 tablet 1  . DULoxetine (CYMBALTA) 30 MG capsule Take 1 capsule by mouth daily.     . ferrous sulfate 325 (65 FE) MG tablet Take 1 tablet (325 mg total) by mouth daily with breakfast. 30 tablet 1  . fluticasone (FLONASE) 50 MCG/ACT nasal spray Place 2 sprays into both nostrils daily.    . insulin aspart (NOVOLOG) 100 UNIT/ML injection Inject 6 Units into the skin 3 (three) times daily with meals. 1 vial 12  . insulin detemir (LEVEMIR) 100 UNIT/ML FlexPen Inject 10 Units into the skin at bedtime.    Marland Kitchen linagliptin (TRADJENTA) 5 MG TABS tablet Take 5 mg by mouth daily.    Marland Kitchen linezolid (ZYVOX) 600 MG tablet Take 1 tablet (600 mg total) by mouth 2 (two) times daily. 42 tablet 0  . lisinopril (ZESTRIL) 2.5 MG tablet Take 2.5 mg by mouth daily.    Marland Kitchen omeprazole (PRILOSEC) 20 MG capsule Take 20 mg by mouth daily.    Marland Kitchen oxyCODONE-acetaminophen (PERCOCET/ROXICET) 5-325 MG tablet Take 1 tablet by mouth every 4 (four) hours as needed for moderate pain or severe pain. 18 tablet 0  . terbinafine (LAMISIL) 1 % cream Apply topically 2 (two) times daily. 30 g 0  . vitamin B-12 (CYANOCOBALAMIN) 500 MCG tablet Take 500 mcg by mouth daily.    Marland Kitchen zonisamide (ZONEGRAN) 100 MG capsule Take 100 mg by mouth daily. (Patient not taking: Reported on 03/09/2020)     No current facility-administered medications for this visit.    OBJECTIVE: There were no vitals filed for this visit.   There is no height or weight on file to calculate BMI.    ECOG FS:{CHL ONC [id number]  General: Well-developed, well-nourished, no acute distress. Eyes: Pink conjunctiva, anicteric sclera. HEENT: Normocephalic, moist mucous membranes. Lungs: No audible wheezing or coughing. Heart: Regular rate and  rhythm. Abdomen: Soft, nontender, no obvious distention. Musculoskeletal: No edema, cyanosis, or clubbing. Neuro: Alert, answering all questions appropriately. Cranial nerves grossly intact. Skin: No rashes or petechiae noted. Psych: Normal affect. Lymphatics: No cervical, calvicular, axillary or inguinal LAD.   LAB RESULTS:  Lab Results  Component Value Date   NA 136 03/16/2020   K 3.9 03/16/2020   CL 104 03/16/2020   CO2 22 03/16/2020   GLUCOSE 164 (H) 03/16/2020   BUN 11 03/16/2020   CREATININE 0.80 03/16/2020   CALCIUM 7.7 (L) 03/16/2020   PROT 6.8 03/14/2020   ALBUMIN 1.7 (L) 03/14/2020   AST 9 (L) 03/14/2020   ALT 7 03/14/2020   ALKPHOS 116 03/14/2020   BILITOT 0.5 03/14/2020   GFRNONAA >60 03/16/2020   GFRAA >60 03/16/2020    Lab Results  Component Value Date   WBC 12.8 (H) 03/17/2020  NEUTROABS 8.5 (H) 03/16/2020   HGB 8.4 (L) 03/17/2020   HCT 26.4 (L) 03/17/2020   MCV 84.9 03/17/2020   PLT 590 (H) 03/17/2020     STUDIES: DG Chest 1 View  Result Date: 03/14/2020 CLINICAL DATA:  Shortness of breath. EXAM: CHEST  1 VIEW COMPARISON:  March 12, 2020 FINDINGS: Decreased lung volumes are again seen which is likely, in part, secondary to the degree of patient inspiration. Very mildly increased bronchovascular lung markings are noted without visualization of the patchy bilateral airspace opacities seen on the prior study. There is no evidence of a pleural effusion or pneumothorax. The heart size and mediastinal contours are within normal limits. The visualized skeletal structures are unremarkable. IMPRESSION: Very mildly increased bronchovascular lung markings without visualization of the patchy bilateral airspace opacities seen on the prior chest plain film. Electronically Signed   By: Virgina Norfolk M.D.   On: 03/14/2020 03:11   DG Chest 1 View  Result Date: 03/12/2020 CLINICAL DATA:  Increased shortness of breath. EXAM: CHEST  1 VIEW COMPARISON:  One-view  chest x-ray is 07/31/2019 FINDINGS: Heart size is exaggerated by low lung volumes. Patchy bilateral airspace opacities are present. Small effusion is suspected on right. No pneumothorax is present. The visualized soft tissues and bony thorax are unremarkable. IMPRESSION: 1. Patchy bilateral airspace disease concerning for multifocal pneumonia. 2. Small right pleural effusion. 3. Low lung volumes. Electronically Signed   By: San Morelle M.D.   On: 03/12/2020 08:36   CT HEAD WO CONTRAST  Result Date: 03/12/2020 CLINICAL DATA:  Delirium, new from baseline. Recent amputation of toe. EXAM: CT HEAD WITHOUT CONTRAST TECHNIQUE: Contiguous axial images were obtained from the base of the skull through the vertex without intravenous contrast. COMPARISON:  CT head without contrast 02/19/2020 FINDINGS: Brain: No acute infarct, hemorrhage, or mass lesion is present. No significant white matter lesions are present. The ventricles are of normal size. No significant extraaxial fluid collection is present. The brainstem and cerebellum are within normal limits. Vascular: No hyperdense vessel or unexpected calcification. Skull: Calvarium is intact. No focal lytic or blastic lesions are present. No significant extracranial soft tissue lesion is present. Sinuses/Orbits: The paranasal sinuses and mastoid air cells are clear. The globes and orbits are within normal limits. IMPRESSION: Negative CT of the head. Electronically Signed   By: San Morelle M.D.   On: 03/12/2020 11:41   CT CHEST WO CONTRAST  Result Date: 03/12/2020 CLINICAL DATA:  Respiratory failure. EXAM: CT CHEST WITHOUT CONTRAST TECHNIQUE: Multidetector CT imaging of the chest was performed following the standard protocol without IV contrast. COMPARISON:  None. FINDINGS: Cardiovascular: No evidence of thoracic aortic aneurysm is noted. Minimal pericardial effusion is noted. Coronary artery calcifications are noted. Normal cardiac size.  Mediastinum/Nodes: No enlarged mediastinal or axillary lymph nodes. Thyroid gland, trachea, and esophagus demonstrate no significant findings. Lungs/Pleura: No pneumothorax is noted. No pleural effusion is noted. Patchy airspace opacities are noted posteriorly in the upper and lower lobes bilaterally consistent with multifocal pneumonia. Upper Abdomen: No acute abnormality. Musculoskeletal: No chest wall mass or suspicious bone lesions identified. IMPRESSION: 1. Patchy airspace opacities are noted posteriorly in the upper and lower lobes bilaterally consistent with multifocal pneumonia. 2. Coronary artery calcifications are noted suggesting coronary artery disease. 3. Minimal pericardial effusion is noted. Electronically Signed   By: Marijo Conception M.D.   On: 03/12/2020 17:52   MR FOOT RIGHT WO CONTRAST  Result Date: 03/10/2020 CLINICAL DATA:  Right foot pain and  swelling.  Diabetic. EXAM: MRI OF THE RIGHT FOREFOOT WITHOUT CONTRAST TECHNIQUE: Multiplanar, multisequence MR imaging of the right foot was performed. No intravenous contrast was administered. COMPARISON:  Radiographs 03/08/2020 FINDINGS: Exam limited by a patient motion and lack of IV contrast. Severe diffuse subcutaneous soft tissue swelling/edema/fluid mainly involving the dorsum of the foot. I do not see any increased T1 signal intensity to suggest hemorrhage/hematoma. More focal appearance of the fluid signal intensity on the dorsum of the foot is suspicious for an abscess but difficult to be certain without contrast. It measures approximately 5 x 4 x 2 cm. There is also a more focal fluid collection along the plantar aspect of the fifth toe and also along the plantar aspect of the foot overlying the fourth metatarsal region. These could be abscesses also. There is T1 and T2 signal abnormality noted in the fifth metatarsal head and fifth proximal phalanx worrisome for osteomyelitis. Could not exclude septic arthritis at the fifth MTP joint. The  surrounding fluid collections are suspicious for abscesses. Diffuse myofasciitis without obvious pyomyositis. The midfoot bony structures are intact. No findings suspicious for osteomyelitis. IMPRESSION: 1. Exam significantly limited by patient motion and lack of contrast. 2. 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 3. Suspect large dorsal and smaller plantar forefoot abscesses as detailed above. 4. Diffuse myofasciitis without obvious pyomyositis. Electronically Signed   By: Marijo Sanes M.D.   On: 03/10/2020 11:58   PERIPHERAL VASCULAR CATHETERIZATION  Result Date: 03/13/2020 See op note  US ARTERIAL ABI (SCREENING LOWER EXTREMITY)  Result Date: 03/09/2020 CLINICAL DATA:  44 year old female with a history of toe necrosis EXAM: NONINVASIVE PHYSIOLOGIC VASCULAR STUDY OF BILATERAL LOWER EXTREMITIES TECHNIQUE: Evaluation of both lower extremities was performed at rest, including calculation of ankle-brachial indices, multiple segmental pressure evaluation, segmental Doppler and segmental pulse volume recording. COMPARISON:  None. FINDINGS: Right ABI:  1.52 Left ABI:  1.05 Right Lower Extremity: Segmental Doppler on the right lower extremity demonstrates biphasic posterior tibial artery and monophasic dorsalis pedis. Left Lower Extremity: Segmental Doppler of the left lower extremity demonstrates triphasic dorsalis pedis and biphasic posterior tibial artery. IMPRESSION: Right: Resting ABI of the right lower extremity likely falsely elevated, potentially from noncompressible vessels. Segmental Doppler at the right ankle demonstrates evidence of developing occlusive disease, though waveforms maintained. Left: Resting ABI within normal limits. Segmental Doppler at the left ankle demonstrates evidence of development occlusive disease, with waveforms relatively maintained. Signed, Dulcy Fanny. Dellia Nims, RPVI  [facility] Electronically Signed   By: Corrie Mckusick D.O.   On: 03/09/2020 14:36   DG Foot Complete Right  Result Date: 03/08/2020 CLINICAL DATA:  Foot infection EXAM: RIGHT FOOT COMPLETE - 3+ VIEW COMPARISON:  11/29/2018 FINDINGS: No fracture or malalignment. Vascular calcifications. Small plantar calcaneal spur. Joint spaces are maintained. IMPRESSION: No acute osseous abnormality. Electronically Signed   By: Donavan Foil M.D.   On: 03/08/2020 19:49    ASSESSMENT: Iron deficiency anemia.  PLAN:    1.  Iron deficiency anemia: Patient's hemoglobin is significantly reduced at 6.9 and plan is to give 1 unit packed red blood cells today.  She she has mild iron deficiency which is likely contributing.  There is no evidence of hemolysis and folate levels are normal.  SPEP is pending at time of dictation.  Continue to monitor CBC and maintain hemoglobin greater than 7.0.  Patient will likely  need a colonoscopy as an outpatient in the near future. 2.  Thrombocytosis: Likely secondary to iron deficiency anemia.  JAK2 mutation was ordered for completeness.  Patient expressed understanding and was in agreement with this plan. She also understands that She can call clinic at any time with any questions, concerns, or complaints.   Cancer Staging No matching staging information was found for the patient.  Lloyd Huger, MD   03/29/2020 9:27 AM

## 2020-03-30 ENCOUNTER — Other Ambulatory Visit: Payer: Self-pay

## 2020-03-30 DIAGNOSIS — D509 Iron deficiency anemia, unspecified: Secondary | ICD-10-CM

## 2020-04-02 ENCOUNTER — Inpatient Hospital Stay: Payer: Medicare Other

## 2020-04-02 ENCOUNTER — Telehealth: Payer: Self-pay | Admitting: Oncology

## 2020-04-02 ENCOUNTER — Inpatient Hospital Stay: Payer: Medicare Other | Admitting: Oncology

## 2020-04-02 NOTE — Telephone Encounter (Signed)
Called patient about scheduled Hospital Follow-up appt for 04/02/2020. Pt declined the appt as she stated she eats regularly and the only issues she has are diabetes and having her toe cut off. She did not want to keep the appt at this time. Clinical team made aware of patients decision.

## 2020-04-03 ENCOUNTER — Telehealth: Payer: Medicare Other | Admitting: Infectious Diseases

## 2020-04-03 ENCOUNTER — Ambulatory Visit: Payer: Medicare Other | Attending: Infectious Diseases | Admitting: Infectious Diseases

## 2020-04-03 ENCOUNTER — Ambulatory Visit: Payer: Medicare Other | Admitting: Infectious Diseases

## 2020-04-03 ENCOUNTER — Encounter: Payer: Self-pay | Admitting: Infectious Diseases

## 2020-04-03 ENCOUNTER — Other Ambulatory Visit: Payer: Self-pay

## 2020-04-03 ENCOUNTER — Telehealth: Payer: Self-pay

## 2020-04-03 ENCOUNTER — Encounter: Payer: Medicare Other | Admitting: Infectious Diseases

## 2020-04-03 DIAGNOSIS — A4902 Methicillin resistant Staphylococcus aureus infection, unspecified site: Secondary | ICD-10-CM | POA: Diagnosis not present

## 2020-04-03 DIAGNOSIS — L089 Local infection of the skin and subcutaneous tissue, unspecified: Secondary | ICD-10-CM | POA: Diagnosis not present

## 2020-04-03 NOTE — Telephone Encounter (Signed)
Scheduled appt with podiatry 04/10/2020 at 9 am. Dr. Delaine Lame will go to that appt and view foot at the same time and will discuss that with Dr.Cline. Patient also has a PM appt with Diabetic doctor to discuss the D/C of insulin. Also called pharmacy to verify patient only picked up 6 antibiotics. Pharmacy stated that patient had 36 count refill later that did not get picked up. Patient never was instructed to return and never took the 36 other pills. Dr. Delaine Lame also called [facility] to discuss this with pharmacist and manager.

## 2020-04-03 NOTE — Progress Notes (Addendum)
The purpose of this virtual visit is to provide medical care while limiting exposure to the novel coronavirus (COVID19) for both patient and office staff.   Consent was obtained for phone visit:  Yes.   Answered questions that patient had about telehealth interaction:  Yes.   I discussed the limitations, risks, security and privacy concerns of performing an evaluation and management service by telephone. I also discussed with the patient that there may be a patient responsible charge related to this service. The patient expressed understanding and agreed to proceed. As televist was not possible the visit was changed to a telephone visit   Patient Location: Home Provider Location:office Follow up visit for rt foot infection  Pt was recently in [facility] between 9/10-9/18/21 For MRSA infection of the rt foot Poorly controlled DM 03/10/20 rt fifthtoe amputation with partial ray excision Angiogram 0n 03/13/20 underwent PTA rt tibioperoneal trunk,  Underwent surgery Multiple I/D of rt foot 03/15/20  MRSA in culture - initially was given vancomycin IV but she complained of GI symptoms, diarrhea, vomiting ( which was likely due to gastroperesis from DM) the antibiotic c=was changed to Linezolid She did well with linezolid 03/16/20     03/13/20  03/13/20  03/09/20       She was discharged on 9/18 and told to take linezolid for 3 weeks with a follow up appt for 03/27/20 with me. She canceled the follow up appt as she did not have a ride and we made a telephone/vidoe appt today. The video call was not good and hence changed to telephone call. Both patient and her husband were on the call. Patient said she only took 6 tablets of linezolid as she was only given 6 by the pharmacy. She has not seen podiatrist or her endocrinologist  She had an appt with heme onc for anemia and thrombocytosis  She says the foot is doing better She has visiting nurse change the dressing  Her husband changes the  dressing as well Pt was asking for pain medication I told her to see her PCP for pain management I made an appt to see podiatrist on 04/10/20 at 42m for the foot  I gave her an appt to see me at 10.30 on the same- day-may see her with the podiatrist  I told her to go to [facility] to get the rest of linezolid I told her that she will have to get blood work after a week of linezolid and will definitely have o see me My CMA also spoke to them to make sure they understood what I had said and also to make medicaid transportation arrangement for the appts Spent 25 min talking to patient and her husband  I called [facility] and spoke to manager shawn regarding why she did not get the complete prescription of linezolid- On 9/19 they only had 6 pills and gave her that and asked her to come back but she did not. As per the manager he had spoken to them many times as she was asking for oxycodone and he told her to come to get the rest of the antibiotic.

## 2020-04-10 ENCOUNTER — Ambulatory Visit: Payer: Medicare Other | Attending: Infectious Diseases | Admitting: Infectious Diseases

## 2020-04-10 ENCOUNTER — Other Ambulatory Visit: Payer: Self-pay

## 2020-04-10 ENCOUNTER — Other Ambulatory Visit
Admission: RE | Admit: 2020-04-10 | Discharge: 2020-04-10 | Disposition: A | Discharge status: Home / Self Care | Payer: Medicare Other | Source: Ambulatory Visit | Attending: Podiatry | Admitting: Podiatry

## 2020-04-10 DIAGNOSIS — Z89422 Acquired absence of other left toe(s): Secondary | ICD-10-CM | POA: Insufficient documentation

## 2020-04-10 DIAGNOSIS — A4902 Methicillin resistant Staphylococcus aureus infection, unspecified site: Secondary | ICD-10-CM

## 2020-04-10 DIAGNOSIS — L089 Local infection of the skin and subcutaneous tissue, unspecified: Secondary | ICD-10-CM | POA: Diagnosis not present

## 2020-04-10 DIAGNOSIS — E1159 Type 2 diabetes mellitus with other circulatory complications: Secondary | ICD-10-CM | POA: Insufficient documentation

## 2020-04-10 DIAGNOSIS — L02619 Cutaneous abscess of unspecified foot: Secondary | ICD-10-CM | POA: Insufficient documentation

## 2020-04-10 DIAGNOSIS — L03119 Cellulitis of unspecified part of limb: Secondary | ICD-10-CM | POA: Diagnosis present

## 2020-04-10 DIAGNOSIS — M86171 Other acute osteomyelitis, right ankle and foot: Secondary | ICD-10-CM | POA: Insufficient documentation

## 2020-04-10 DIAGNOSIS — E11628 Type 2 diabetes mellitus with other skin complications: Secondary | ICD-10-CM

## 2020-04-10 NOTE — Progress Notes (Signed)
NAME: DEMIA Mcmillan  DOB: 09-30-1975  MRN: [id number]  Date/Time: 04/10/2020 10:40 AM   Subjective:   Follow up visit- pt seen with [place] podiatrist ? Kristy Mcmillan is a 44 y.o. female with a history of DM , HTn Was recently in [facility] between 9/10-9/18 for rt foot abscess, and sift tissue infection with MRSA. She underwent on 03/10/20 5th toe amputation with partial ray excision. She had angio on 03/13/20 and underwent PTA of the rt tibioperoneal trunk. On 03/15/20 she was taken back for surgery to have multiple I/D of the rt foot abscess All cultures had MRSA After being on vancomycin ( IV) c/o diarrhea it was switched to linezolid and she was discharged on 03/17/20 and told to take linezolid for 3 weeks- prescription was sent to [facility] by our pharmacy team and they talked to her and huer husband about the sideeffects of linezolid and food and medicine to avoid. She was asked to follow up with Dr.Cline in 2 weeks after discharge and I had given her an appt for sept 28th. [facility] but never went back to collect the 36 other pills which they did not have it the first time Pt canceled her appt with me due to not having a ride and then we made a remote appt ( initially video which later changed to telephone as it was malfunctioning at her end). During that visit I came to know She got 6 pills of [facility] but never went back to collect the 36 other pills which they did not have it the first time. As per patient the pharmacy never gave her the antibiotics and when I called [facility] they told they had sent reminder messages and also talked to her and she was only asking for pain meds. So I told patient to go and collect the rest of the linezolid and start talking it which she did on 04/04/20 night. Pt says her dressing is done by the nurse once a week and her husband and patient herself The outside bandage was very dirty as if she had walked on it. She denied any fever or  chills She does not take insulin because it causes her vomiting She is on trajenda    Past Medical History:  Diagnosis Date  . Diabetes mellitus without complication ([place])   . Seizures ([place]) 2005 Last sz   childhood febrile sz, then ?eclampsia    Past Surgical History:  Procedure Laterality Date  . AMPUTATION TOE Right 03/10/2020   Procedure: AMPUTATION FIFTH RAY WITH IRRIGATION AND DEBRIDEMENT RIGHT FOOT;  Surgeon: Sharlotte Alamo, DPM;  Location: [facility];  Service: Podiatry;  Laterality: Right;  . CESAREAN SECTION    . CESAREAN SECTION N/A 03/02/2013   Procedure: CESAREAN SECTION;  Surgeon: Florian Buff, MD;  Location: [place] [facility];  Service: Obstetrics;  Laterality: N/A;  Repeat Cesarean Section Delivery Nonviable Baby Girl @ 2158  . IRRIGATION AND DEBRIDEMENT FOOT Right 03/15/2020   Procedure: IRRIGATION AND DEBRIDEMENT FOOT;  Surgeon: Sharlotte Alamo, DPM;  Location: [facility];  Service: Podiatry;  Laterality: Right;  . LOWER EXTREMITY ANGIOGRAPHY Right 03/13/2020   Procedure: Lower Extremity Angiography;  Surgeon: Katha Cabal, MD;  Location: [facility];  Service: Cardiovascular;  Laterality: Right;    Social History   Socioeconomic History  . Marital status: Married    Spouse name: Not on file  . Number of children: Not on file  . Years of education:  Not on file  . Highest education level: Not on file  Occupational History  . Not on file  Tobacco Use  . Smoking status: Current Some Day Smoker  . Smokeless tobacco: Never Used  Substance and Sexual Activity  . Alcohol use: No  . Drug use: No  . Sexual activity: Yes  Other Topics Concern  . Not on file  Social History Narrative  . Not on file   Social Determinants of Health   Financial Resource Strain:   . Difficulty of Paying Living Expenses: Not on file  Food Insecurity:   . Worried About Charity fundraiser in the Last Year: Not on file  . Ran Out of Food in the Last Year: Not on file  Transportation Needs:    . Lack of Transportation (Medical): Not on file  . Lack of Transportation (Non-Medical): Not on file  Physical Activity:   . Days of Exercise per Week: Not on file  . Minutes of Exercise per Session: Not on file  Stress:   . Feeling of Stress : Not on file  Social Connections:   . Frequency of Communication with Friends and Family: Not on file  . Frequency of Social Gatherings with Friends and Family: Not on file  . Attends Religious Services: Not on file  . Active Member of Clubs or Organizations: Not on file  . Attends Archivist Meetings: Not on file  . Marital Status: Not on file  Intimate Partner Violence:   . Fear of Current or Ex-Partner: Not on file  . Emotionally Abused: Not on file  . Physically Abused: Not on file  . Sexually Abused: Not on file    Family History  Problem Relation Age of Onset  . Diabetes Mother   . Diabetes Father    Allergies  Allergen Reactions  . Dilantin [Phenytoin Sodium Extended] Other (See Comments)    Causes seizures  . Phenobarbital Other (See Comments)    Causes seizures Other reaction(s): Unknown Causes seizures  . Phenytoin Sodium Extended     Other reaction(s): Other (See Comments) Causes seizures  . Aspartame Diarrhea and Nausea And Vomiting  . Aspartame And Phenylalanine Diarrhea and Nausea And Vomiting  . Carbamazepine Other (See Comments)    Other reaction(s): Unknown  . Cymbalta [Duloxetine Hcl] Diarrhea  . Gabapentin Other (See Comments)    Other reaction(s): Unknown Feels like she is going to have a seizure  . Ciprofloxacin Rash    blisters  . Humalog [Insulin Lispro] Rash  . Lantus [Insulin Glargine] Rash  . Novolin 70-30 [Insulin Nph Isophane & Regular] Itching and Rash  . Penicillins Itching and Rash    ? Current Outpatient Medications  Medication Sig Dispense Refill  . albuterol (VENTOLIN HFA) 108 (90 Base) MCG/ACT inhaler INHALE 2 PUFFS BY MOUTH INTO THE LUNGS EVERY 4 HOURS AS NEEDED    .  amitriptyline (ELAVIL) 25 MG tablet TAKE 2 TABLETS AT NIGHT, INCREASE TO 3 TABLETS AFTER 2 WEEKS IF NEEDED (Patient not taking: Reported on 04/03/2020)    . aspirin EC 81 MG EC tablet Take 1 tablet (81 mg total) by mouth daily. Swallow whole. 30 tablet 1  . atorvastatin (LIPITOR) 40 MG tablet Take 40 mg by mouth at bedtime.    . calcium-vitamin D (OSCAL WITH D) 500-200 MG-UNIT tablet Take 1 tablet by mouth.    . cholecalciferol (VITAMIN D) 25 MCG (1000 UT) tablet Take 1,000 Units by mouth daily.    . clopidogrel (PLAVIX)  75 MG tablet Take 1 tablet (75 mg total) by mouth daily with breakfast. 30 tablet 1  . ferrous sulfate 325 (65 FE) MG tablet Take 1 tablet (325 mg total) by mouth daily with breakfast. 30 tablet 1  . fluticasone (FLONASE) 50 MCG/ACT nasal spray Place 2 sprays into both nostrils daily.    . insulin aspart (NOVOLOG) 100 UNIT/ML injection Inject 6 Units into the skin 3 (three) times daily with meals. (Patient not taking: Reported on 04/03/2020) 1 vial 12  . insulin detemir (LEVEMIR) 100 UNIT/ML FlexPen Inject 10 Units into the skin at bedtime. (Patient not taking: Reported on 04/03/2020)    . linagliptin (TRADJENTA) 5 MG TABS tablet Take 5 mg by mouth daily.    Marland Kitchen lisinopril (ZESTRIL) 2.5 MG tablet Take 2.5 mg by mouth daily.    Marland Kitchen omeprazole (PRILOSEC) 20 MG capsule Take 20 mg by mouth daily.    Marland Kitchen oxyCODONE-acetaminophen (PERCOCET/ROXICET) 5-325 MG tablet Take 1 tablet by mouth every 4 (four) hours as needed for moderate pain or severe pain. (Patient not taking: Reported on 04/03/2020) 18 tablet 0  . terbinafine (LAMISIL) 1 % cream Apply topically 2 (two) times daily. 30 g 0  . vitamin B-12 (CYANOCOBALAMIN) 500 MCG tablet Take 500 mcg by mouth daily.    Marland Kitchen zonisamide (ZONEGRAN) 100 MG capsule Take 100 mg by mouth daily. (Patient not taking: Reported on 03/09/2020)     No current facility-administered medications for this visit.     Abtx:  Anti-infectives (From admission, onward)   None        REVIEW OF SYSTEMS:  Const: negative fever, negative chills, negative weight loss Eyes: negative diplopia or visual changes, negative eye pain ENT: negative coryza, negative sore throat Resp: negative cough, hemoptysis, dyspnea Cards: negative for chest pain, palpitations, lower extremity edema GU: negative for frequency, dysuria and hematuria GI: Negative for abdominal pain, diarrhea, bleeding, constipation Skin: negative for rash and pruritus Heme: negative for easy bruising and gum/nose bleeding MS: rt foot pain better Neurolo:negative for headaches, dizziness, vertigo, memory problems   Endocrine: diabetes Allergy/Immunology- multiple side effects allergies as listed above Objective:  VITALS:  BP 140/88, HR 113, Pluse ox 98% PHYSICAL EXAM:  General: Alert, cooperative, no distress, appears stated age.  Head: Normocephalic, without obvious abnormality, atraumatic. Eyes: Conjunctivae clear, anicteric sclerae. Pupils are equal ENT Nares normal. No drainage or sinus tenderness. Lips, mucosa, and tongue normal. No Thrush Neck: Supple,  Lungs: Clear to auscultation bilaterally. No Wheezing or Rhonchi. No rales. Heart: Regular rate and rhythm, no murmur, rub or gallop. Abdomen:soft Extremities: rt foot Dressing outside was very dirty  It was wet and soggy On removing the dressing Foot was soggy The sutures were removed by Dr.cline There was some edema of the foot Some pinkish erythema Antibiotic beads 04/10/20       03/16/20      03/13/20   03/09/20   03/09/20    03/09/20     Skin: No rashes or lesions. Or bruising Lymph: Cervical, supraclavicular normal. Neurologic: Grossly non-focal Pertinent Labs Lab Results CBC    Component Value Date/Time   WBC 12.8 (H) 03/17/2020 0439   RBC 3.11 (L) 03/17/2020 0439   HGB 8.4 (L) 03/17/2020 0439   HGB 11.6 (L) 10/23/2012 1807   HCT 26.4 (L) 03/17/2020 0439   HCT 34.0 (L) 10/23/2012 1807   PLT 590 (H)  03/17/2020 0439   PLT 367 10/23/2012 1807   MCV 84.9 03/17/2020 0439   MCV 83  10/23/2012 1807   MCH 27.0 03/17/2020 0439   MCHC 31.8 03/17/2020 0439   RDW 13.6 03/17/2020 0439   RDW 12.8 10/23/2012 1807   LYMPHSABS 3.6 03/16/2020 0428   MONOABS 0.7 03/16/2020 0428   EOSABS 0.3 03/16/2020 0428   BASOSABS 0.1 03/16/2020 0428    CMP Latest Ref Rng & Units 03/16/2020 03/14/2020 03/13/2020  Glucose 70 - 99 mg/dL 164(H) 134(H) 147(H)  BUN 6 - 20 mg/dL '11 13 18  ' Creatinine 0.44 - 1.00 mg/dL 0.80 0.97 1.01(H)  Sodium 135 - 145 mmol/L 136 134(L) 133(L)  Potassium 3.5 - 5.1 mmol/L 3.9 3.9 4.1  Chloride 98 - 111 mmol/L 104 104 101  CO2 22 - 32 mmol/L 22 19(L) 22  Calcium 8.9 - 10.3 mg/dL 7.7(L) 8.1(L) 8.1(L)  Total Protein 6.5 - 8.1 g/dL - 6.8 7.2  Total Bilirubin 0.3 - 1.2 mg/dL - 0.5 0.4  Alkaline Phos 38 - 126 U/L - 116 139(H)  AST 15 - 41 U/L - 9(L) 12(L)  ALT 0 - 44 U/L - 7 8      Microbiology: 9/9 BC=NG 03/09/20 WC MRSA 9/11 Abscess- MRSA  Pathology TOE, RIGHT FIFTH; AMPUTATION:  - ACUTE OSTEOMYELITIS.  - SKIN AND SUBCUTANEOUS TISSUE WITH ULCERATION, ABSCESS, AND NECROSIS.  - ACTIVE INFLAMMATION IS PRESENT AT THE SKIN/SOFT TISSUE MARGIN.  - BONE MARGIN IS NEGATIVE FOR ACTIVE INFLAMMATION   ? Impression/Recommendation ? Rt foot infection with MRSA- severe /soft tisse and 5th toe bone infection wiith underlying poorly controlled diabetes mellitus  S/p 5th toe ray excision with proximal margin clear of acute osteomyelitis. Repeat I/D done as well Pt was discharged on 9/18 on linezolid for 3 weeks and asked to follow up with podiatrist in 2 weeks and with me on 9/28. She missed the appt with me and did not see podiatrist as well On  04/04/20 I made a telephone visit with her and came to know that she was not taking any linezolid as she had received only 6 pills- After I called pharmacy I came to know that they had given her 6 pills and asked her to come back for the rest of 36  as they did not have enough pills on day 1 and had ordered them. Miscommunication led to patient not taking any more antibiotic She restarted on 04/04/20 The surgical site is soggy and the dressingis very dirty- pt needs to keep the foot elevated, keep the dressing dry, change dressing every day.  CBC/CMP/ESR/CRP done today Dr.Cline took another culture and xray  I will see the patient next Thursday  DM- she is seeing her endocrinologist this afternoon She has not been taking insulin  Discussed with her husband on the phone

## 2020-04-10 NOTE — Patient Instructions (Addendum)
You are here for follow up of the rt foot infection- with MRSA- after only taking 6 pills of linezolid and a break of 17 days you restarted linezolid on 04/04/20 Today I saw you with Dr.Cline the foot doctor. We did some labs- ( CBC/CMP/ESR/CRP) a culture was repeated. I will see you back on 04/19/20 at 10.15AM Please call transportation and make an appt. Continue linezolid 600mg Po BID 

## 2020-04-15 LAB — AEROBIC/ANAEROBIC CULTURE W GRAM STAIN (SURGICAL/DEEP WOUND)

## 2020-04-19 ENCOUNTER — Other Ambulatory Visit
Admission: RE | Admit: 2020-04-19 | Discharge: 2020-04-19 | Disposition: A | Discharge status: Home / Self Care | Payer: Medicare Other | Attending: Infectious Diseases | Admitting: Infectious Diseases

## 2020-04-19 ENCOUNTER — Encounter: Payer: Self-pay | Admitting: Infectious Diseases

## 2020-04-19 ENCOUNTER — Ambulatory Visit: Payer: Medicare Other | Attending: Infectious Diseases | Admitting: Infectious Diseases

## 2020-04-19 ENCOUNTER — Other Ambulatory Visit: Payer: Self-pay

## 2020-04-19 VITALS — BP 138/82 | HR 112 | Resp 16 | Ht 64.0 in | Wt 167.0 lb

## 2020-04-19 DIAGNOSIS — A4902 Methicillin resistant Staphylococcus aureus infection, unspecified site: Secondary | ICD-10-CM | POA: Insufficient documentation

## 2020-04-19 DIAGNOSIS — Z7902 Long term (current) use of antithrombotics/antiplatelets: Secondary | ICD-10-CM | POA: Diagnosis not present

## 2020-04-19 DIAGNOSIS — Z7982 Long term (current) use of aspirin: Secondary | ICD-10-CM | POA: Diagnosis not present

## 2020-04-19 DIAGNOSIS — L089 Local infection of the skin and subcutaneous tissue, unspecified: Secondary | ICD-10-CM | POA: Insufficient documentation

## 2020-04-19 DIAGNOSIS — I10 Essential (primary) hypertension: Secondary | ICD-10-CM | POA: Diagnosis not present

## 2020-04-19 DIAGNOSIS — Z7901 Long term (current) use of anticoagulants: Secondary | ICD-10-CM | POA: Diagnosis not present

## 2020-04-19 DIAGNOSIS — F17210 Nicotine dependence, cigarettes, uncomplicated: Secondary | ICD-10-CM | POA: Diagnosis not present

## 2020-04-19 DIAGNOSIS — Z79899 Other long term (current) drug therapy: Secondary | ICD-10-CM | POA: Insufficient documentation

## 2020-04-19 DIAGNOSIS — M8610 Other acute osteomyelitis, unspecified site: Secondary | ICD-10-CM | POA: Diagnosis not present

## 2020-04-19 DIAGNOSIS — E118 Type 2 diabetes mellitus with unspecified complications: Secondary | ICD-10-CM | POA: Diagnosis not present

## 2020-04-19 LAB — CBC WITH DIFFERENTIAL/PLATELET
Abs Immature Granulocytes: 0.02 10*3/uL (ref 0.00–0.07)
Basophils Absolute: 0 10*3/uL (ref 0.0–0.1)
Basophils Relative: 1 %
Eosinophils Absolute: 0.1 10*3/uL (ref 0.0–0.5)
Eosinophils Relative: 2 %
HCT: 34.1 % — ABNORMAL LOW (ref 36.0–46.0)
Hemoglobin: 11.4 g/dL — ABNORMAL LOW (ref 12.0–15.0)
Immature Granulocytes: 0 %
Lymphocytes Relative: 36 %
Lymphs Abs: 3.1 10*3/uL (ref 0.7–4.0)
MCH: 27.5 pg (ref 26.0–34.0)
MCHC: 33.4 g/dL (ref 30.0–36.0)
MCV: 82.4 fL (ref 80.0–100.0)
Monocytes Absolute: 0.5 10*3/uL (ref 0.1–1.0)
Monocytes Relative: 6 %
Neutro Abs: 4.8 10*3/uL (ref 1.7–7.7)
Neutrophils Relative %: 55 %
Platelets: 281 10*3/uL (ref 150–400)
RBC: 4.14 MIL/uL (ref 3.87–5.11)
RDW: 13.4 % (ref 11.5–15.5)
WBC: 8.6 10*3/uL (ref 4.0–10.5)
nRBC: 0 % (ref 0.0–0.2)

## 2020-04-19 LAB — BASIC METABOLIC PANEL
Anion gap: 10 (ref 5–15)
BUN: 28 mg/dL — ABNORMAL HIGH (ref 6–20)
CO2: 21 mmol/L — ABNORMAL LOW (ref 22–32)
Calcium: 9.1 mg/dL (ref 8.9–10.3)
Chloride: 105 mmol/L (ref 98–111)
Creatinine, Ser: 1.06 mg/dL — ABNORMAL HIGH (ref 0.44–1.00)
GFR, Estimated: >60 mL/min (ref >60)
Glucose, Bld: 213 mg/dL — ABNORMAL HIGH (ref 70–99)
Potassium: 4.6 mmol/L (ref 3.5–5.1)
Sodium: 136 mmol/L (ref 135–145)

## 2020-04-19 LAB — SEDIMENTATION RATE: Sed Rate: 73 mm/hr — ABNORMAL HIGH (ref 0–20)

## 2020-04-19 LAB — C-REACTIVE PROTEIN: CRP: 0.6 mg/dL (ref <1.0)

## 2020-04-19 MED ORDER — CLINDAMYCIN HCL 300 MG PO CAPS: 300.0000 mg | ORAL_CAPSULE | Freq: Three times a day (TID) | ORAL | 0 refills | Status: DC

## 2020-04-19 NOTE — Patient Instructions (Addendum)
You are here for follow up of the rt foot infection- the foot is better than before You are on linezolid and you say that it is causing chest pain and you want to stop it.  Only other option is oral clindamycin 300mg  capsule every 8 hours- I sent the prescription to [place] Please take yogurt or probiotic with this pill. If this causes diarrhea please call us [phone]  Keep the rt leg elevated all the time If the bandage gets wet you need to change it  Do not get the foot wet Change dressing every day Today will do labs- will see you back in 2 weeks We have [facility] an appt [facility] 04[facility]/20 as you cannot go there today

## 2020-04-19 NOTE — Progress Notes (Signed)
NAME: Kristy Mcmillan  DOB: 07/20/1975  MRN: [id number]  Date/Time: 04/19/2020 10:57 AM   Subjective:   ? Kristy Mcmillan is a 44 y.o. 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 She underwent on 03/10/20 5th toe amputation with partial ray excision. She had angio on 03/13/20 and underwent PTA of the rt tibioperoneal trunk. On 03/15/20 she was taken back for surgery to have multiple I/D of the rt foot abscess All cultures had MRSA After being on vancomycin ( IV) c/o diarrhea it was switched to linezolid and she was discharged on 03/17/20 and told to take linezolid for 3 weeks- prescription was [facility] to [facility] by our pharmacy team and they talked to her and huer husband about the sideeffects of linezolid and food and medicine to avoid. She was asked to follow up with Dr.Cline in 2 weeks after discharge and I had given her an appt for sept 28th. [facility] but never went back to collect the 36 other pills which they did not have it the first time Pt canceled her appt with me due to not having a ride and then we made a remote appt ( initially video which later changed to telephone as it was malfunctioning at her end). During that visit I came to know She got 6 [facility] but never went back to collect the 36 other pills which they did not have it the first time. As per patient the pharmacy never gave her the antibiotics and when I called [facility] they told they had sent reminder messages and also talked to her and she was only asking for pain meds. So I told patient to go and collect the rest of the linezolid and start talking it which she did  on 04/04/20 night. Saw her with Dr.Cline on 04/10/20 She has been gradually improving Her husband on the phone c/o rt foot pain and asking for pain emds other than tylenol, NSAID Asked him to talk to her PCP  Past Medical History:  Diagnosis Date  . Diabetes mellitus without complication (HCC)   . Seizures (HCC) 2005 Last sz   childhood febrile sz, then ?eclampsia    Past Surgical History:  Procedure Laterality Date  . AMPUTATION TOE Right 03/10/2020   Procedure: AMPUTATION FIFTH RAY WITH IRRIGATION AND DEBRIDEMENT RIGHT FOOT;  Surgeon: Cline, Todd, DPM;  Location: [facility];  Service: Podiatry;  Laterality: Right;  . CESAREAN SECTION    . CESAREAN SECTION N/A 03/02/2013   Procedure: CESAREAN SECTION;  Surgeon: Luther H Eure, MD;  Location: [facility];  Service: Obstetrics;  Laterality: N/A;  Repeat Cesarean Section Delivery Nonviable Baby Girl @ 2158  . IRRIGATION AND DEBRIDEMENT FOOT Right 03/15/2020   Procedure: IRRIGATION AND DEBRIDEMENT FOOT;  Surgeon: Cline, Todd, DPM;  Location: [facility];  Service: Podiatry;  Laterality: Right;  . LOWER EXTREMITY ANGIOGRAPHY Right 03/13/2020   Procedure: Lower Extremity Angiography;  Surgeon: Schnier, Gregory G, MD;  Location: [facility];  Service: Cardiovascular;  Laterality: Right;    Social History   Socioeconomic History  . Marital status: Married      Spouse name: Not on file  . Number of children: Not on file  . Years of education: Not on file  . Highest education level: Not on file  Occupational History  . Not on file  Tobacco Use  . Smoking status: Current Some Day Smoker  . Smokeless tobacco: Never Used  Substance and Sexual Activity  . Alcohol use: No  . Drug use: No  . Sexual activity: Yes  Other Topics Concern  . Not on file  Social History Narrative  . Not on file   Social Determinants of Health   Financial Resource Strain:   . Difficulty of Paying Living Expenses: Not on file  Food Insecurity:   . Worried About  Charity fundraiser in the Last Year: Not on file  . Ran Out of Food in the Last Year: Not on file  Transportation Needs:   . Lack of Transportation (Medical): Not on file  . Lack of Transportation (Non-Medical): Not on file  Physical Activity:   . Days of Exercise per Week: Not on file  . Minutes of Exercise per Session: Not on file  Stress:   . Feeling of Stress : Not on file  Social Connections:   . Frequency of Communication with Friends and Family: Not on file  . Frequency of Social Gatherings with Friends and Family: Not on file  . Attends Religious Services: Not on file  . Active Member of Clubs or Organizations: Not on file  . Attends Archivist Meetings: Not on file  . Marital Status: Not on file  Intimate Partner Violence:   . Fear of Current or Ex-Partner: Not on file  . Emotionally Abused: Not on file  . Physically Abused: Not on file  . Sexually Abused: Not on file    Family History  Problem Relation Age of Onset  . Diabetes Mother   . Diabetes Father    Allergies  Allergen Reactions  . Dilantin [Phenytoin Sodium Extended] Other (See Comments)    Causes seizures  . Phenobarbital Other (See Comments)    Causes seizures Other reaction(s): Unknown Causes seizures  . Phenytoin Sodium Extended     Other reaction(s): Other (See Comments) Causes seizures  . Aspartame Diarrhea and Nausea And Vomiting  . Aspartame And Phenylalanine Diarrhea and Nausea And Vomiting  . Carbamazepine Other (See Comments)    Other reaction(s): Unknown  . Cymbalta [Duloxetine Hcl] Diarrhea  . Gabapentin Other (See Comments)    Other reaction(s): Unknown Feels like she is going to have a seizure  . Ciprofloxacin Rash    blisters  . Humalog [Insulin Lispro] Rash  . Lantus [Insulin Glargine] Rash  . Novolin 70-30 [Insulin Nph Isophane & Regular] Itching and Rash  . Penicillins Itching and Rash    Current Outpatient Medications  Medication Sig Dispense Refill  . albuterol  (VENTOLIN HFA) 108 (90 Base) MCG/ACT inhaler INHALE 2 PUFFS BY MOUTH INTO THE LUNGS EVERY 4 HOURS AS NEEDED    . amitriptyline (ELAVIL) 25 MG tablet TAKE 2 TABLETS AT NIGHT, INCREASE TO 3 TABLETS AFTER 2 WEEKS IF NEEDED    . aspirin EC 81 MG EC tablet Take 1 tablet (81 mg total) by mouth daily. Swallow whole. 30 tablet 1  . atorvastatin (LIPITOR) 40 MG tablet Take 40 mg by mouth at bedtime.    . calcium-vitamin D (OSCAL WITH D) 500-200 MG-UNIT tablet Take 1 tablet by mouth.    . cholecalciferol (VITAMIN D) 25 MCG (1000 UT) tablet Take  1,000 Units by mouth daily.    . clopidogrel (PLAVIX) 75 MG tablet Take 1 tablet (75 mg total) by mouth daily with breakfast. 30 tablet 1  . fluticasone (FLONASE) 50 MCG/ACT nasal spray Place 2 sprays into both nostrils daily.    . linagliptin (TRADJENTA) 5 MG TABS tablet Take 5 mg by mouth daily.    . linezolid (ZYVOX) 600 MG tablet Take 600 mg by mouth 2 (two) times daily.    . lisinopril (ZESTRIL) 2.5 MG tablet Take 2.5 mg by mouth daily.    . omeprazole (PRILOSEC) 20 MG capsule Take 20 mg by mouth daily.    . oxyCODONE-acetaminophen (PERCOCET/ROXICET) 5-325 MG tablet Take 1 tablet by mouth every 4 (four) hours as needed for moderate pain or severe pain. 18 tablet 0  . terbinafine (LAMISIL) 1 % cream Apply topically 2 (two) times daily. 30 g 0  . vitamin B-12 (CYANOCOBALAMIN) 500 MCG tablet Take 500 mcg by mouth daily.    . zonisamide (ZONEGRAN) 100 MG capsule Take 100 mg by mouth daily.     . ferrous sulfate 325 (65 FE) MG tablet Take 1 tablet (325 mg total) by mouth daily with breakfast. (Patient not taking: Reported on 04/19/2020) 30 tablet 1  . insulin aspart (NOVOLOG) 100 UNIT/ML injection Inject 6 Units into the skin 3 (three) times daily with meals. (Patient not taking: Reported on 04/19/2020) 1 vial 12  . insulin detemir (LEVEMIR) 100 UNIT/ML FlexPen Inject 10 Units into the skin at bedtime.  (Patient not taking: Reported on 04/19/2020)     No current  facility-administered medications for this visit.     Abtx:  Anti-infectives (From admission, onward)   None      REVIEW OF SYSTEMS:  Const: negative fever, negative chills, negative weight loss Eyes: negative diplopia or visual changes, negative eye pain ENT: negative coryza, negative sore throat Resp: negative cough, hemoptysis, dyspnea Cards:  chest pain, no palpitations, lower extremity edema GU: negative for frequency, dysuria and hematuria GI: Negative for abdominal pain, diarrhea, bleeding, constipation Skin: negative for rash and pruritus Heme: negative for easy bruising and gum/nose bleeding MS: foot pain  Neurolo:negative for headaches, dizziness, vertigo, memory problems  Psych: negative for feelings of anxiety, depression  Endocrine: DM Allergy/Immunology- as above Objective:  VITALS:  BP 138/82   Pulse (!) 112   Resp 16   Ht 5' 4" (1.626 m)   Wt 167 lb (75.8 kg)   SpO2 98%   BMI 28.67 kg/m  PHYSICAL EXAM:  General: Alert, cooperative, no distress, appears stated age.  Head: Normocephalic, without obvious abnormality, atraumatic. Eyes: Conjunctivae clear, anicteric sclerae. Pupils are equal ENT Nares normal. No drainage or sinus tenderness. Lips, mucosa, and tongue normal. No Thrush Neck: Supple, symmetrical, no adenopathy, thyroid: non tender no carotid bruit and no JVD. Back: No CVA tenderness. Lungs: Clear to auscultation bilaterally. No Wheezing or Rhonchi. No rales. Heart: Regular rate and rhythm, no murmur, rub or gallop. Abdomen: Soft, non-tender,not distended. Bowel sounds normal. No masses Dorsal wound improving, smaller, minimal slough 04/19/20  04/19/20   04/10/20  04/10/20               Neurologic: Grossly non-focal In wheel chair Pertinent Labs Lab Results CBC 04/10/20 WBC 11.3 HB 12.2 PLT 485 ESR 41  CBC    Component Value Date/Time   WBC 8.6 04/19/2020 1207   RBC 4.14 04/19/2020 1207   HGB 11.4 (L)  04/19/2020 1207   HGB 11.6 (L) 10/23/2012   1807   HCT 34.1 (L) 04/19/2020 1207   HCT 34.0 (L) 10/23/2012 1807   PLT 281 04/19/2020 1207   PLT 367 10/23/2012 1807   MCV 82.4 04/19/2020 1207   MCV 83 10/23/2012 1807   MCH 27.5 04/19/2020 1207   MCHC 33.4 04/19/2020 1207   RDW 13.4 04/19/2020 1207   RDW 12.8 10/23/2012 1807   LYMPHSABS 3.1 04/19/2020 1207   MONOABS 0.5 04/19/2020 1207   EOSABS 0.1 04/19/2020 1207   BASOSABS 0.0 04/19/2020 1207   Cr 1.06 Glucose 213 Microbiology: Recent Results (from the past 240 hour(s))  Aerobic/Anaerobic Culture (surgical/deep wound)     Status: Abnormal   Collection Time: 04/10/20  2:39 PM   Specimen: Wound; Abscess  Result Value Ref Range Status   Specimen Description   Final    WOUND Performed at [facility], [address] [place], [place] [address]    Special Requests   Final    RIGHT  FOOT Performed at [facility], [address] [place], [place] [address]    Gram Stain   Final    RARE WBC PRESENT,BOTH PMN AND MONONUCLEAR FEW GRAM POSITIVE COCCI RARE GRAM NEGATIVE RODS RARE GRAM POSITIVE RODS    Culture (A)  Final    MULTIPLE ORGANISMS PRESENT, NONE PREDOMINANT NO STAPHYLOCOCCUS AUREUS ISOLATED NO GROUP A STREP (S.PYOGENES) ISOLATED NO ANAEROBES ISOLATED Performed at [facility], [address] [place], [place] [address]    Report Status 04/15/2020 FINAL  Final   Microbiology: 9/9 BC=NG 03/09/20 WC MRSA 9/11 Abscess- MRSA  Pathology TOE, RIGHT FIFTH; AMPUTATION:  - ACUTE OSTEOMYELITIS.  - SKIN AND SUBCUTANEOUS TISSUE WITH ULCERATION, ABSCESS, AND NECROSIS.  - ACTIVE INFLAMMATION IS PRESENT AT THE SKIN/SOFT TISSUE MARGIN.  - BONE MARGIN IS NEGATIVE FOR ACTIVE INFLAMMATION ? Impression/Recommendation ? ?Rt foot infection with MRSA- severe /soft tisse and 5th toe bone infection wiith underlying poorly controlled diabetes mellitus  S/p 5th toe ray excision with proximal margin clear of  acute osteomyelitis. Repeat I/D done as well Pt was discharged on 9/18 on linezolid for 3 weeks and asked to follow up with podiatrist in 2 weeks and with me on 9/28. She missed the appt with me and did not see podiatrist as well On  04/04/20 I made a telephone visit with her and came to know that she was not taking any linezolid as she had received only 6 pills- After I called pharmacy I came to know that they had given her 6 pills and asked her to come back for the rest of 36 as they did not have enough pills on day 1 and had ordered them. Miscommunication led to patient not taking any more antibiotic She restarted on 04/04/20 Seen on 04/10/20 with Dr.Cline when the wound looked soggy Today it is much better - smaller , less swollen She is on linezolid and has 3 more left of 36 pills Says  she has some chest tightness on the rt side and thinks it is due to linezolid and does not want to take it. She needs another 7-10 days of antibiotic Doxy is another option but she says she gets diarrhea She is willing to take clindamycin for the MRSA Told her to take yogurt or probiotic To inform me if she has diarrhea Asked her to keep the foot elevated Not to get it wet Not to have soggy dressing    CBC/CMP/ESR/CRP done today  DM-poorly controlled. Not sure whether she saw the   endocrinologist  ?[facility] on Monday -04/23/20 for follow up and also for pain medication  Follow up with me in 2 weeks ___________________________________________________ Discussed with patient and husband ( on the phone Note:  This document was prepared using Dragon voice recognition software and may include unintentional dictation errors. 

## 2020-04-24 ENCOUNTER — Encounter: Payer: Self-pay | Admitting: Infectious Diseases

## 2020-05-03 ENCOUNTER — Other Ambulatory Visit: Payer: Self-pay | Admitting: Infectious Diseases

## 2020-05-03 ENCOUNTER — Ambulatory Visit: Payer: Medicare Other | Admitting: Infectious Diseases

## 2020-05-03 ENCOUNTER — Telehealth: Payer: Self-pay

## 2020-05-03 ENCOUNTER — Other Ambulatory Visit: Payer: Self-pay

## 2020-05-03 MED ORDER — CLINDAMYCIN HCL 300 MG PO CAPS
300.0000 mg | ORAL_CAPSULE | Freq: Three times a day (TID) | ORAL | 0 refills | Status: DC
Start: 2020-05-03 — End: 2020-05-03

## 2020-05-03 MED ORDER — CLINDAMYCIN HCL 300 MG PO CAPS
300.0000 mg | ORAL_CAPSULE | Freq: Three times a day (TID) | ORAL | 0 refills | Status: DC
Start: 2020-05-03 — End: 2020-07-03

## 2020-05-03 NOTE — Telephone Encounter (Signed)
Called to advise patient she missed appt. Before speaking she said she knows she missed appt because her sister had her power turned off. Patient started discussing issues with transportation and I asked how her foot looks. She explained her [place] came by and stated the wound has broken open due to her walking on the foot. She finished Abx yesterday. Patient offered new appt and refused asking if Dr Delaine Lame can see her at her foot doctor on 05/15/20. Advised her we will call in Clindamycin refills since there is now an open wound. Sent refill to [facility]. Patient aware.

## 2020-07-03 ENCOUNTER — Inpatient Hospital Stay
Admission: AD | Admit: 2020-07-03 | Payer: Medicare Other | Source: Other Acute Inpatient Hospital | Admitting: Interventional Radiology

## 2020-07-03 ENCOUNTER — Encounter: Payer: Self-pay | Admitting: Radiology

## 2020-07-03 ENCOUNTER — Emergency Department
Admission: EM | Admit: 2020-07-03 | Discharge: 2020-07-04 | Disposition: A | Discharge status: Other Acute Inpatient Hospital | Payer: Medicare Other | Attending: Emergency Medicine | Admitting: Emergency Medicine

## 2020-07-03 ENCOUNTER — Other Ambulatory Visit: Payer: Self-pay

## 2020-07-03 ENCOUNTER — Emergency Department: Payer: Medicare Other

## 2020-07-03 DIAGNOSIS — I639 Cerebral infarction, unspecified: Secondary | ICD-10-CM | POA: Insufficient documentation

## 2020-07-03 DIAGNOSIS — Z20822 Contact with and (suspected) exposure to covid-19: Secondary | ICD-10-CM | POA: Diagnosis not present

## 2020-07-03 DIAGNOSIS — Z7982 Long term (current) use of aspirin: Secondary | ICD-10-CM | POA: Insufficient documentation

## 2020-07-03 DIAGNOSIS — Z79899 Other long term (current) drug therapy: Secondary | ICD-10-CM | POA: Insufficient documentation

## 2020-07-03 DIAGNOSIS — E109 Type 1 diabetes mellitus without complications: Secondary | ICD-10-CM | POA: Diagnosis not present

## 2020-07-03 DIAGNOSIS — F17290 Nicotine dependence, other tobacco product, uncomplicated: Secondary | ICD-10-CM | POA: Insufficient documentation

## 2020-07-03 DIAGNOSIS — R4701 Aphasia: Secondary | ICD-10-CM

## 2020-07-03 DIAGNOSIS — I10 Essential (primary) hypertension: Secondary | ICD-10-CM | POA: Diagnosis not present

## 2020-07-03 DIAGNOSIS — Z7984 Long term (current) use of oral hypoglycemic drugs: Secondary | ICD-10-CM | POA: Diagnosis not present

## 2020-07-03 DIAGNOSIS — R531 Weakness: Secondary | ICD-10-CM | POA: Diagnosis present

## 2020-07-03 DIAGNOSIS — I6930 Unspecified sequelae of cerebral infarction: Secondary | ICD-10-CM

## 2020-07-03 DIAGNOSIS — R29898 Other symptoms and signs involving the musculoskeletal system: Secondary | ICD-10-CM

## 2020-07-03 DIAGNOSIS — Z9282 Status post administration of tPA (rtPA) in a different facility within the last 24 hours prior to admission to current facility: Secondary | ICD-10-CM

## 2020-07-03 DIAGNOSIS — E119 Type 2 diabetes mellitus without complications: Secondary | ICD-10-CM

## 2020-07-03 LAB — RESP PANEL BY RT-PCR (FLU A&B, COVID) ARPGX2
Influenza A by PCR: NEGATIVE
Influenza B by PCR: NEGATIVE
SARS Coronavirus 2 by RT PCR: NEGATIVE

## 2020-07-03 LAB — CBC
HCT: 33.4 % — ABNORMAL LOW (ref 36.0–46.0)
Hemoglobin: 11.5 g/dL — ABNORMAL LOW (ref 12.0–15.0)
MCH: 28 pg (ref 26.0–34.0)
MCHC: 34.4 g/dL (ref 30.0–36.0)
MCV: 81.5 fL (ref 80.0–100.0)
Platelets: 427 10*3/uL — ABNORMAL HIGH (ref 150–400)
RBC: 4.1 MIL/uL (ref 3.87–5.11)
RDW: 12.8 % (ref 11.5–15.5)
WBC: 8.9 10*3/uL (ref 4.0–10.5)
nRBC: 0 % (ref 0.0–0.2)

## 2020-07-03 LAB — DIFFERENTIAL
Abs Immature Granulocytes: 0.03 10*3/uL (ref 0.00–0.07)
Basophils Absolute: 0.1 10*3/uL (ref 0.0–0.1)
Basophils Relative: 1 %
Eosinophils Absolute: 0.2 10*3/uL (ref 0.0–0.5)
Eosinophils Relative: 3 %
Immature Granulocytes: 0 %
Lymphocytes Relative: 38 %
Lymphs Abs: 3.4 10*3/uL (ref 0.7–4.0)
Monocytes Absolute: 0.5 10*3/uL (ref 0.1–1.0)
Monocytes Relative: 5 %
Neutro Abs: 4.6 10*3/uL (ref 1.7–7.7)
Neutrophils Relative %: 53 %

## 2020-07-03 LAB — COMPREHENSIVE METABOLIC PANEL
ALT: 19 U/L (ref 0–44)
AST: 13 U/L — ABNORMAL LOW (ref 15–41)
Albumin: 3.2 g/dL — ABNORMAL LOW (ref 3.5–5.0)
Alkaline Phosphatase: 77 U/L (ref 38–126)
Anion gap: 6 (ref 5–15)
BUN: 30 mg/dL — ABNORMAL HIGH (ref 6–20)
CO2: 26 mmol/L (ref 22–32)
Calcium: 9 mg/dL (ref 8.9–10.3)
Chloride: 106 mmol/L (ref 98–111)
Creatinine, Ser: 0.94 mg/dL (ref 0.44–1.00)
GFR, Estimated: >60 mL/min (ref >60)
Glucose, Bld: 271 mg/dL — ABNORMAL HIGH (ref 70–99)
Potassium: 4 mmol/L (ref 3.5–5.1)
Sodium: 138 mmol/L (ref 135–145)
Total Bilirubin: 0.6 mg/dL (ref 0.3–1.2)
Total Protein: 7.1 g/dL (ref 6.5–8.1)

## 2020-07-03 LAB — TROPONIN I (HIGH SENSITIVITY)
Troponin I (High Sensitivity): 5 ng/L (ref <18)
Troponin I (High Sensitivity): 5 ng/L (ref <18)

## 2020-07-03 LAB — CBG MONITORING, ED: Glucose-Capillary: 235 mg/dL — ABNORMAL HIGH (ref 70–99)

## 2020-07-03 LAB — PROTIME-INR
INR: 1 (ref 0.8–1.2)
Prothrombin Time: 12.8 seconds (ref 11.4–15.2)

## 2020-07-03 LAB — APTT: aPTT: 30 seconds (ref 24–36)

## 2020-07-03 MED ORDER — SODIUM CHLORIDE 0.9 % IV SOLN
50.0000 mL | Freq: Once | INTRAVENOUS | Status: AC
  Administered 2020-07-03: 50 mL via INTRAVENOUS

## 2020-07-03 MED ORDER — LABETALOL HCL 5 MG/ML IV SOLN
5.0000 mg | Freq: Once | INTRAVENOUS | Status: AC
  Administered 2020-07-03: 5 mg via INTRAVENOUS
  Filled 2020-07-03: qty 4

## 2020-07-03 MED ORDER — SODIUM CHLORIDE 0.9% FLUSH
3.0000 mL | Freq: Once | INTRAVENOUS | Status: AC
  Administered 2020-07-03: 3 mL via INTRAVENOUS

## 2020-07-03 MED ORDER — ALTEPLASE (STROKE) FULL DOSE INFUSION
0.9000 mg/kg | Freq: Once | INTRAVENOUS | Status: AC
  Administered 2020-07-03: 59.9 mg via INTRAVENOUS
  Filled 2020-07-03: qty 100

## 2020-07-03 MED ORDER — IOHEXOL 350 MG/ML SOLN
75.0000 mL | Freq: Once | INTRAVENOUS | Status: AC | PRN
  Administered 2020-07-03: 75 mL via INTRAVENOUS

## 2020-07-03 NOTE — ED Notes (Signed)
ED Provider at bedside. 

## 2020-07-03 NOTE — ED Notes (Signed)
This RN was handed a bag of items, pharm states is tPA. There is an open bottle of tPA and a 38m syringe with clear liquid in it with red sterile cap on end. Asked pharm to explain what was in the bag, met with resistance. Found that 674msyringe was waste dose. Syringe unlabeled in bag. Charge RN involved at this time due to safety and near miss.

## 2020-07-03 NOTE — ED Triage Notes (Signed)
Patient presents to the ED via EMS.  Per EMS, they were called out for possible stroke.  Patient is having difficulty moving her right arm and her right leg.  Patient has very slurred speech.  Thiis RN called her husband and he stated that slurred speech and difficulty walking began at 4pm per husband.  Husband states patient lay down for a nap around 3pm and when she got up, she couldn't walk.

## 2020-07-03 NOTE — ED Notes (Signed)
Pt with c/o pain to Rt shoulder. Will inform provider of c/o at this time. Pt states "No tramadol".

## 2020-07-03 NOTE — ED Notes (Signed)
Code  stroke  called  to 333  and  carelink

## 2020-07-03 NOTE — Consult Note (Signed)
Neurology H&P  CC: slurred speech  History is obtained from: chart, patient and ED staff  HPI: Kristy Mcmillan is a 45 y.o. female left handed previous history as reviewed below and stroke went to sleep at her baseline 1500 and woke ~1530 with speech difficulty and left right upper extremity weakness.  She says that she stopped taking antiplatelet therapy due to left shoulder pain.  She has never had this before.  Denies changes in vision/hearing, N/V, SOB, CP.  LKW: 1500 tpa given?: yes IR Thrombectomy? No, lvo {odified Rankin Scale: unclear NIHSS: 3 LOC Responsiveness 0 LOC Questions 0 LOC Commands 0 Horizontal eye movement 0 Visual field 0 Facial palsy 0 Motor arm - Right arm 1 Motor arm - Left arm 0 Motor leg - Right leg 0 Motor leg - Left leg 0 Limb ataxia 1 Sensory test 0 Language 1 Speech 0 Extinction and inattention 0  ROS: A complete ROS was performed and is negative except as noted in the HPI.    Past Medical History:  Diagnosis Date  . Diabetes mellitus without complication ([place])   . Seizures ([place]) 2005 Last sz   childhood febrile sz, then ?eclampsia     Family History  Problem Relation Age of Onset  . Diabetes Mother   . Diabetes Father     Social History:  reports that she has been smoking. She has never used smokeless tobacco. She reports that she does not drink alcohol and does not use drugs.   Prior to Admission medications   Medication Sig Start Date End Date Taking? Authorizing Provider  albuterol (VENTOLIN HFA) 108 (90 Base) MCG/ACT inhaler INHALE 2 PUFFS BY MOUTH INTO THE LUNGS EVERY 4 HOURS AS NEEDED 01/27/20   [provider]  amitriptyline (ELAVIL) 25 MG tablet TAKE 2 TABLETS AT NIGHT, INCREASE TO 3 TABLETS AFTER 2 WEEKS IF NEEDED 09/20/18   [provider]  aspirin EC 81 MG EC tablet Take 1 tablet (81 mg total) by mouth daily. Swallow whole. 03/18/20   Nolberto Hanlon, MD  atorvastatin (LIPITOR) 40 MG tablet Take 40 mg by  mouth at bedtime. 11/29/19   [provider]  calcium-vitamin D (OSCAL WITH D) 500-200 MG-UNIT tablet Take 1 tablet by mouth.    [provider]  cholecalciferol (VITAMIN D) 25 MCG (1000 UT) tablet Take 1,000 Units by mouth daily.    [provider]  clindamycin (CLEOCIN) 300 MG capsule Take 1 capsule (300 mg total) by mouth 3 (three) times daily. 05/03/20   Tsosie Billing, MD  clopidogrel (PLAVIX) 75 MG tablet Take 1 tablet (75 mg total) by mouth daily with breakfast. 03/18/20   Nolberto Hanlon, MD  ferrous sulfate 325 (65 FE) MG tablet Take 1 tablet (325 mg total) by mouth daily with breakfast. Patient not taking: Reported on 04/19/2020 03/18/20   Nolberto Hanlon, MD  fluticasone (FLONASE) 50 MCG/ACT nasal spray Place 2 sprays into both nostrils daily. 12/01/19   [provider]  insulin aspart (NOVOLOG) 100 UNIT/ML injection Inject 6 Units into the skin 3 (three) times daily with meals. Patient not taking: Reported on 04/19/2020 03/04/13   Kassie Mends, MD  insulin detemir (LEVEMIR) 100 UNIT/ML FlexPen Inject 10 Units into the skin at bedtime.  Patient not taking: Reported on 04/19/2020 01/20/20 01/19/21  [provider]  linagliptin (TRADJENTA) 5 MG TABS tablet Take 5 mg by mouth daily.    [provider]  linezolid (ZYVOX) 600 MG tablet Take 600 mg by mouth  2 (two) times daily. 04/03/20   [provider]  lisinopril (ZESTRIL) 2.5 MG tablet Take 2.5 mg by mouth daily.    [provider]  omeprazole (PRILOSEC) 20 MG capsule Take 20 mg by mouth daily. 03/08/20   [provider]  oxyCODONE-acetaminophen (PERCOCET/ROXICET) 5-325 MG tablet Take 1 tablet by mouth every 4 (four) hours as needed for moderate pain or severe pain. 03/17/20   Nolberto Hanlon, MD  terbinafine (LAMISIL) 1 % cream Apply topically 2 (two) times daily. 03/17/20   Nolberto Hanlon, MD  vitamin B-12 (CYANOCOBALAMIN) 500 MCG tablet Take 500 mcg by mouth daily.     [provider]  zonisamide (ZONEGRAN) 100 MG capsule Take 100 mg by mouth daily.  01/26/20 01/25/21  [provider]    Exam: Current vital signs: BP (!) 150/93 (BP Location: Left Arm)   Pulse 99   Temp 98.1 F (36.7 C) (Oral)   Resp 20   Wt 66.6 kg   SpO2 100%   BMI 25.20 kg/m   Physical Exam  Constitutional: Appears well-developed and well-nourished.  Psych: Affect appropriate to situation Eyes: No scleral injection HENT: No OP obstrucion Head: Normocephalic.  Cardiovascular: Normal rate and regular rhythm.  Respiratory: Effort normal and breath sounds normal to anterior ascultation GI: Soft.  No distension. There is no tenderness.  Skin: WDI  Neuro: Mental Status: Patient is awake, alert, oriented to person, place, month, year, and situation. Patient is able to give a coherent history. Edentulous. Speech is fluent no anomia, good comprehension variably impaired expression, no neglect. Cranial Nerves: II: Visual Fields are full. Pupils are equal, round, and reactive to light. III,IV, VI: EOMI without ptosis or diploplia.  V: Facial sensation is symmetric to temperature VII: Facial movement is symmetric.  VIII: hearing is intact to voice X: Uvula elevates symmetrically XI: Shoulder shrug is symmetric. XII: tongue is midline without atrophy or fasciculations.  Motor: Tone is normal. Bulk is normal. 5/5 strength was present in all extremities except left upper 4/5. Sensory: Sensation is symmetric to light touch and temperature in the arms and legs. Deep Tendon Reflexes: 2+ and symmetric in the biceps and patellae. Plantars: Toes are downgoing bilaterally. Cerebellar:  Mild ataxia right upper and HKS are intact bilaterally.  I have reviewed labs in epic and the pertinent results are:   Ref. Range 07/03/2020 18:03  Glucose Latest Ref Range: 70 - 99 mg/dL 271 (H)  BUN Latest Ref Range: 6 - 20 mg/dL 30 (H)  Creatinine Latest Ref Range: 0.44 - 1.00  mg/dL 0.94  Calcium Latest Ref Range: 8.9 - 10.3 mg/dL 9.0  Anion gap Latest Ref Range: 5 - 15  6  Alkaline Phosphatase Latest Ref Range: 38 - 126 U/L 77  Albumin Latest Ref Range: 3.5 - 5.0 g/dL 3.2 (L)  AST Latest Ref Range: 15 - 41 U/L 13 (L)    I have reviewed the images obtained: NCT head showed did not show acute ischemic changes, mass or hemorrhage CTA head and neck showed no large vessel occlusion however there were tandem moderate stenotic lesions in bilateral proximal PCAs left worse than right.  Assessment: LINDSAY FREVERT is a 45 y.o. female PMHx DM 2, HTN, seizure and prior stroke with variable symptoms of aphasia and right UE weakness suspicious for stroke. Patient is non-adherent with antiplatelet therapy. The patient expressed that speech difficulty is new. Her baseline function is not clear. She has significant vascular risk factors and previous stroke and therefore the  decision was made to administer tPA.  *Please note that there were delays in tPA as vascular access was very difficult to attain necessitating near-infrared light. Pharmacy was called for tPA and the decision was made to pursue CTA head and neck due to aphasia. After CTA was finished tPA arrived however vascular access had to be re-established. CTP could not be performed because 18G access was not possible.    Plan: - Admit for post-tPA monitoring. - Repeat NCT head in 24 hours if MRI brain not done. - MRI brain without contrast. - Recommend TTE. - Recommend labs: HbA1c, lipid panel, TSH. - Recommend Statin if LDL > 70 - Aspirin 81mg  daily 24 hours after tPA. - Permissive hypertension first 24 h < 220/110.  - Telemetry monitoring for arrhythmia. - Recommend bedside Swallow screen. - Recommend Stroke education. - Recommend PT/OT/SLP consult.  Attempts were made to call the patient's husband however no answer.  This patient is critically ill and at significant risk of neurological worsening, death and  care requires constant monitoring of vital signs, hemodynamics,respiratory and cardiac monitoring, neurological assessment, discussion with family, other specialists and medical decision making of high complexity. I spent 75 minutes of neurocritical care time  in the care of  this patient. This was time spent independent of any time provided by nurse practitioner or PA.  Electronically signed by: Dr. Pager: [phone] 07/03/2020, 6:44 PM

## 2020-07-03 NOTE — ED Notes (Signed)
Pt rolling around in bed, screaming, states "I can't do this", when asked what pt needs, states "My back is killing me, my shoulder, the pain". Provider aware of the continued c/o pain. No new orders received.

## 2020-07-03 NOTE — ED Notes (Signed)
Bolus of 5.9mg  given rapid at 1910. Infusion of 59.9mg  given over one hour at 1911. 100ml NS waiting to infuse after.

## 2020-07-03 NOTE — ED Provider Notes (Addendum)
[facility] Emergency Department Provider Note   ____________________________________________   Event Date/Time   First MD Initiated Contact with Patient 07/03/20 1823     (approximate)  I have reviewed the triage vital signs and the nursing notes.   HISTORY  Chief Complaint Neurologic Problem   HPI Kristy Mcmillan is a 45 y.o. female who want to take a nap at about 3:00 and when she got up she could not walk.  This was noticed about 330 or 4:00.  Husband reports she could not walk and had very slurry speech.  Patient still having right-sided weakness and slurry speech.  She has a past history of diabetes and childhood febrile seizures and possibly eclampsia.  She had an old stroke with deficit seen on CT in the left thalamic area patient takes Plavix and aspirin.  Fingerstick here was 229.         Past Medical History:  Diagnosis Date  . Diabetes mellitus without complication ([place])   . Seizures ([place]) 2005 Last sz   childhood febrile sz, then ?eclampsia    Patient Active Problem List   Diagnosis Date Noted  . Iron deficiency anemia 03/27/2020  . Lower extremity cellulitis 03/09/2020  . Vision changes 01/25/2020  . Bilateral foot pain 03/28/2019  . Low back pain 03/28/2019  . Numbness and tingling of both feet 03/28/2019  . Neuropathic pain of both legs 02/24/2019  . B12 deficiency 01/28/2019  . Vitamin D deficiency 01/28/2019  . Cold feet 11/17/2018  . Neuropathy due to secondary diabetes mellitus ([place]) 11/04/2018  . Pain in joint, lower leg 11/04/2018  . PAD (peripheral artery disease) ([place]) 11/04/2018  . Morbid obesity ([place]) 07/19/2013  . IUFD (intrauterine fetal death) March 09, 2013  . GERD (gastroesophageal reflux disease) 12/01/2012  . Tick bite of abdomen 12/01/2012  . Previous cesarean section complicating pregnancy, antepartum condition or complication [id number]  . Headache in pregnancy, antepartum 10/25/2012  . DM (diabetes  mellitus), type 1 ([place]) 09/22/2012  . Seizure disorder ([place]) 09/22/2012    Past Surgical History:  Procedure Laterality Date  . AMPUTATION TOE Right 03/10/2020   Procedure: AMPUTATION FIFTH RAY WITH IRRIGATION AND DEBRIDEMENT RIGHT FOOT;  Surgeon: Sharlotte Alamo, DPM;  Location: [facility];  Service: Podiatry;  Laterality: Right;  . CESAREAN SECTION    . CESAREAN SECTION N/A 03-09-13   Procedure: CESAREAN SECTION;  Surgeon: Florian Buff, MD;  Location: [facility];  Service: Obstetrics;  Laterality: N/A;  Repeat Cesarean Section Delivery Nonviable Baby Girl @ 2158  . IRRIGATION AND DEBRIDEMENT FOOT Right 03/15/2020   Procedure: IRRIGATION AND DEBRIDEMENT FOOT;  Surgeon: Sharlotte Alamo, DPM;  Location: [facility];  Service: Podiatry;  Laterality: Right;  . LOWER EXTREMITY ANGIOGRAPHY Right 03/13/2020   Procedure: Lower Extremity Angiography;  Surgeon: Katha Cabal, MD;  Location: [facility];  Service: Cardiovascular;  Laterality: Right;    Prior to Admission medications   Medication Sig Start Date End Date Taking? Authorizing Provider  albuterol (VENTOLIN HFA) 108 (90 Base) MCG/ACT inhaler INHALE 2 PUFFS BY MOUTH INTO THE LUNGS EVERY 4 HOURS AS NEEDED 01/27/20   [provider]  aspirin EC 81 MG EC tablet Take 1 tablet (81 mg total) by mouth daily. Swallow whole. 03/18/20   Nolberto Hanlon, MD  atorvastatin (LIPITOR) 40 MG tablet Take 40 mg by mouth at bedtime. 11/29/19   [provider]  calcium-vitamin D (OSCAL WITH D) 500-200 MG-UNIT tablet Take 1 tablet by mouth.  [provider]  cholecalciferol (VITAMIN D) 25 MCG (1000 UT) tablet Take 1,000 Units by mouth daily.    [provider]  clopidogrel (PLAVIX) 75 MG tablet Take 1 tablet (75 mg total) by mouth daily with breakfast. 03/18/20   Lynn Ito, MD  DULoxetine (CYMBALTA) 30 MG capsule Take 30 mg by mouth 2 (two) times daily. 06/05/20   [provider]  ferrous sulfate 325 (65 FE) MG tablet Take 1  tablet (325 mg total) by mouth daily with breakfast. Patient not taking: Reported on 04/19/2020 03/18/20   Lynn Ito, MD  fluticasone (FLONASE) 50 MCG/ACT nasal spray Place 2 sprays into both nostrils daily. 12/01/19   [provider]  ibuprofen (ADVIL) 800 MG tablet Take 800 mg by mouth every 8 (eight) hours as needed for pain. 04/25/20   [provider]  insulin detemir (LEVEMIR) 100 UNIT/ML FlexPen Inject 10 Units into the skin at bedtime.  Patient not taking: Reported on 04/19/2020 01/20/20 01/19/21  [provider]  linagliptin (TRADJENTA) 5 MG TABS tablet Take 5 mg by mouth daily.    [provider]  linezolid (ZYVOX) 600 MG tablet Take 600 mg by mouth 2 (two) times daily. 04/03/20   [provider]  lisinopril (ZESTRIL) 2.5 MG tablet Take 2.5 mg by mouth daily.    [provider]  nortriptyline (PAMELOR) 10 MG capsule Take 40 mg by mouth at bedtime. 06/06/20   [provider]  omeprazole (PRILOSEC) 20 MG capsule Take 20 mg by mouth daily. 03/08/20   [provider]  traMADol (ULTRAM) 50 MG tablet Take 75 mg by mouth 2 (two) times daily. 06/05/20   [provider]  vitamin B-12 (CYANOCOBALAMIN) 500 MCG tablet Take 500 mcg by mouth daily.    [provider]  zonisamide (ZONEGRAN) 100 MG capsule Take 100 mg by mouth daily.  01/26/20 01/25/21  [provider]    Allergies Dilantin [phenytoin sodium extended], Phenobarbital, Phenytoin sodium extended, Aspartame, Aspartame and phenylalanine, Carbamazepine, Cymbalta [duloxetine hcl], Gabapentin, Ciprofloxacin, Humalog [insulin lispro], Lantus [insulin glargine], Novolin 70-30 [insulin nph isophane & regular], and Penicillins  Family History  Problem Relation Age of Onset  . Diabetes Mother   . Diabetes Father     Social History Social History   Tobacco Use  . Smoking status: Current Some Day Smoker  . Smokeless tobacco: Never Used  Substance Use  Topics  . Alcohol use: No  . Drug use: No    Review of Systems  Constitutional: No fever/chills Eyes: No visual changes. ENT: No sore throat. Cardiovascular: Denies chest pain. Respiratory: Denies shortness of breath. Gastrointestinal: No abdominal pain.  No nausea, no vomiting.  No diarrhea.  No constipation. Genitourinary: Negative for dysuria. Musculoskeletal: Negative for back pain. Skin: Negative for rash. Neurological: Negative for headaches,   ____________________________________________   PHYSICAL EXAM:  VITAL SIGNS: ED Triage Vitals [07/03/20 1801]  Enc Vitals Group     BP (!) 150/93     Pulse Rate 99     Resp 20     Temp 98.1 F (36.7 C)     Temp Source Oral     SpO2 100 %     Weight      Height      Head Circumference      Peak Flow      Pain Score      Pain Loc      Pain Edu?      Excl. in GC?  Constitutional: Alert and oriented. Well appearing and in no acute distress. Eyes: Conjunctivae are normal.  Head: Atraumatic. Nose: No congestion/rhinnorhea. Mouth/Throat: Mucous membranes are moist.  Oropharynx non-erythematous. Neck: No stridor.  Cardiovascular: Normal rate, regular rhythm. Grossly normal heart sounds.  Good peripheral circulation. Respiratory: Normal respiratory effort.  No retractions. Lungs CTAB. Gastrointestinal: Soft and nontender. No distention. No abdominal bruits. N. Musculoskeletal: No lower extremity tenderness nor edema.   Neurologic: Brief neuro exam reveals slurry speech and right-sided weakness.  Patient can barely lift her leg and arm off the bed.  Neurology is here to see the patient now. Skin:  Skin is warm, dry and intact.    ____________________________________________   LABS (all labs ordered are listed, but only abnormal results are displayed)  Labs Reviewed  CBC - Abnormal; Notable for the following components:      Result Value   Hemoglobin 11.5 (*)    HCT 33.4 (*)    Platelets 427 (*)    All other  components within normal limits  COMPREHENSIVE METABOLIC PANEL - Abnormal; Notable for the following components:   Glucose, Bld 271 (*)    BUN 30 (*)    Albumin 3.2 (*)    AST 13 (*)    All other components within normal limits  CBG MONITORING, ED - Abnormal; Notable for the following components:   Glucose-Capillary 235 (*)    All other components within normal limits  RESP PANEL BY RT-PCR (FLU A&B, COVID) ARPGX2  PROTIME-INR  APTT  DIFFERENTIAL   ____________________________________________  EKG  EKG read interpreted by me shows normal sinus rhythm rate of 82 normal axis no acute ST-T wave changes ____________________________________________  RADIOLOGY Jill Poling, personally viewed and evaluated these images (plain radiographs) as part of my medical decision making, as well as reviewing the written report by the radiologist.  ED MD interpretation: CT read by radiology reviewed by me does not show any acute disease there is a small old left thalamic infarct.  Official radiology report(s): CT Angio Head W or Wo Contrast  Result Date: 07/03/2020 CLINICAL DATA:  Initial evaluation for possible stroke, neuro deficit, right-sided weakness, slurred speech. EXAM: CT ANGIOGRAPHY HEAD AND NECK TECHNIQUE: Multidetector CT imaging of the head and neck was performed using the standard protocol during bolus administration of intravenous contrast. Multiplanar CT image reconstructions and MIPs were obtained to evaluate the vascular anatomy. Carotid stenosis measurements (when applicable) are obtained utilizing NASCET criteria, using the distal internal carotid diameter as the denominator. CONTRAST:  56mL OMNIPAQUE IOHEXOL 350 MG/ML SOLN COMPARISON:  Prior CT from earlier the same day. FINDINGS: CTA NECK FINDINGS Aortic arch: Visualized aortic arch of normal caliber with normal branch pattern. No hemodynamically significant stenosis seen about the origin of the great vessels. Right carotid  system: Right common carotid artery widely patent from its origin to the bifurcation. Minimal centric calcified plaque at the right bifurcation without stenosis. Right ICA widely patent distally without stenosis, dissection or occlusion. Left carotid system: Left common and internal carotid arteries widely patent without stenosis, dissection or occlusion. No significant atheromatous narrowing about the left bifurcation. Vertebral arteries: Both vertebral arteries arise from subclavian arteries. No proximal subclavian artery stenosis. Vertebral arteries widely patent without stenosis, dissection or occlusion. Skeleton: No acute osseous abnormality. No discrete or worrisome osseous lesions. Poor dentition noted. Other neck: No other acute soft tissue abnormality within the neck. No mass or adenopathy. Upper chest: Visualized upper chest demonstrates no acute finding. Scattered atelectatic  changes noted within the visualized lungs. Review of the MIP images confirms the above findings CTA HEAD FINDINGS Anterior circulation: Petrous segments widely patent. Mild atheromatous change within the carotid siphons without hemodynamically significant stenosis. A1 segments widely patent. Normal anterior communicating artery complex. Anterior cerebral arteries patent to their distal aspects without proximal high-grade stenosis. No M1 stenosis or occlusion. Normal MCA bifurcations. No proximal MCA branch occlusion. MCA branches well perfused and symmetric bilaterally. Mild to moderate small vessel atheromatous irregularity, advanced for age. Posterior circulation: Both V4 segments widely patent to the vertebrobasilar junction. Both PICA origins patent and normal. Basilar widely patent to its distal aspect without stenosis. Superior cerebral arteries patent bilaterally. Both PCAs primarily supplied via the basilar. Multifocal moderate to severe stenoses noted throughout the left P1 and P2 segments. Short-segment severe right P1  stenosis noted (series 606, image 94). Both PCAs remain perfused to their distal aspects. Venous sinuses: Patent. Anatomic variants: None significant.  No intracranial aneurysm. Review of the MIP images confirms the above findings IMPRESSION: 1. Negative CTA for emergent large vessel occlusion. 2. 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 3. Distal small vessel atheromatous irregularity about the intracranial circulation, advanced for age. 4. Wide patency of the major arterial vasculature of the neck. Electronically Signed   By: Jeannine Boga M.D.   On: 07/03/2020 19:19   CT HEAD CODE STROKE WO CONTRAST  Result Date: 07/03/2020 CLINICAL DATA:  Code stroke.  Difficulty moving right arm EXAM: CT HEAD WITHOUT CONTRAST TECHNIQUE: Contiguous axial images were obtained from the base of the skull through the vertex without intravenous contrast. COMPARISON:  March 12, 2020 FINDINGS: Brain: There is no acute intracranial hemorrhage, mass effect, or edema. Gray-white differentiation is preserved. There is a small chronic infarct of the medial left thalamus. Ventricles and sulci are normal in size and configuration. There is no extra-axial collection. Vascular: No hyperdense vessel. Mild intracranial atherosclerotic calcification is present at the skull base. Skull: Unremarkable. Sinuses/Orbits: Mild deformity of the medial right orbital wall may reflect prior trauma. Other: Minimal mastoid opacification. ASPECTS ([place] Stroke Program Early CT Score) - Ganglionic level infarction (caudate, lentiform nuclei, internal capsule, insula, M1-M3 cortex): 7 - Supraganglionic infarction (M4-M6 cortex): 3 Total score (0-10 with 10 being normal): 10 IMPRESSION: There is no acute intracranial hemorrhage or evidence of acute infarction. ASPECT score is 10. Small chronic left thalamic infarct. These results were called by telephone at the time of interpretation on 07/03/2020 at 6:15 pm  to provider PHILLIP STAFFORD , who verbally acknowledged these results. Electronically Signed   By: Macy Mis M.D.   On: 07/03/2020 18:17   CT ANGIO NECK CODE STROKE  Result Date: 07/03/2020 CLINICAL DATA:  Initial evaluation for possible stroke, neuro deficit, right-sided weakness, slurred speech. EXAM: CT ANGIOGRAPHY HEAD AND NECK TECHNIQUE: Multidetector CT imaging of the head and neck was performed using the standard protocol during bolus administration of intravenous contrast. Multiplanar CT image reconstructions and MIPs were obtained to evaluate the vascular anatomy. Carotid stenosis measurements (when applicable) are obtained utilizing NASCET criteria, using the distal internal carotid diameter as the denominator. CONTRAST:  47mL OMNIPAQUE IOHEXOL 350 MG/ML SOLN COMPARISON:  Prior CT from earlier the same day. FINDINGS: CTA NECK FINDINGS Aortic arch: Visualized aortic arch of normal caliber with normal branch pattern. No hemodynamically significant stenosis seen about the origin of the great vessels. Right carotid system: Right common carotid artery widely patent from its origin to the  bifurcation. Minimal centric calcified plaque at the right bifurcation without stenosis. Right ICA widely patent distally without stenosis, dissection or occlusion. Left carotid system: Left common and internal carotid arteries widely patent without stenosis, dissection or occlusion. No significant atheromatous narrowing about the left bifurcation. Vertebral arteries: Both vertebral arteries arise from subclavian arteries. No proximal subclavian artery stenosis. Vertebral arteries widely patent without stenosis, dissection or occlusion. Skeleton: No acute osseous abnormality. No discrete or worrisome osseous lesions. Poor dentition noted. Other neck: No other acute soft tissue abnormality within the neck. No mass or adenopathy. Upper chest: Visualized upper chest demonstrates no acute finding. Scattered atelectatic  changes noted within the visualized lungs. Review of the MIP images confirms the above findings CTA HEAD FINDINGS Anterior circulation: Petrous segments widely patent. Mild atheromatous change within the carotid siphons without hemodynamically significant stenosis. A1 segments widely patent. Normal anterior communicating artery complex. Anterior cerebral arteries patent to their distal aspects without proximal high-grade stenosis. No M1 stenosis or occlusion. Normal MCA bifurcations. No proximal MCA branch occlusion. MCA branches well perfused and symmetric bilaterally. Mild to moderate small vessel atheromatous irregularity, advanced for age. Posterior circulation: Both V4 segments widely patent to the vertebrobasilar junction. Both PICA origins patent and normal. Basilar widely patent to its distal aspect without stenosis. Superior cerebral arteries patent bilaterally. Both PCAs primarily supplied via the basilar. Multifocal moderate to severe stenoses noted throughout the left P1 and P2 segments. Short-segment severe right P1 stenosis noted (series 606, image 94). Both PCAs remain perfused to their distal aspects. Venous sinuses: Patent. Anatomic variants: None significant.  No intracranial aneurysm. Review of the MIP images confirms the above findings IMPRESSION: 1. Negative CTA for emergent large vessel occlusion. 2. 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 3. Distal small vessel atheromatous irregularity about the intracranial circulation, advanced for age. 4. Wide patency of the major arterial vasculature of the neck. Electronically Signed   By: Jeannine Boga M.D.   On: 07/03/2020 19:19    ____________________________________________   PROCEDURES  Procedure(s) performed (including Critical Care): Medical care time 20 minutes this includes evaluating the patient speaking to neurology and on arranging a transfer when we could not find her make an ICU bed  or stepdown bed and speaking to neurology at [facility].  Procedures   ____________________________________________   INITIAL IMPRESSION / ASSESSMENT AND PLAN / ED COURSE  Patient with signs and symptoms most consistent with acute stroke.  No sign of bleeding on CT scan.  Patient is within the window of TPA.  Neurology was called TPA was administered patient doing well.  Neurology standing by here in the emergency room unfortunately do not have any ICU or stepdown beds to put this patient in.  We will have to transfer her.  Dr. Leonel Ramsay at [facility] excepted the patient in transfer.    Patient then complains of pain in the right shoulder.  Patient had complained to the nurse of pain in the right shoulder and hip previously and said that was what was keeping her from moving but had not complained of  that of to me and I did not know about the previous complaints.  Pain was reproducible by palpation.  EKG while slightly different from previous EKG today looks similar to prior EKGs done here.  Troponin was negative.  Patient is still waiting for transfer to [facility] for an ICU bed.  Her blood pressure had been somewhat elevated came down with a little  bit of labetalol and talking to by the nurse.  Patient has been stable.        ____________________________________________   FINAL CLINICAL IMPRESSION(S) / ED DIAGNOSES  Final diagnoses:  Cerebrovascular accident (CVA), unspecified mechanism [facility])     ED Discharge Orders    None      *Please note:  Kristy Mcmillan was evaluated in Emergency Department on 07/03/2020 for the symptoms described in the history of present illness. She was evaluated in the context of the global COVID-19 pandemic, which necessitated consideration that the patient might be at risk for infection with the SARS-CoV-2 virus that causes COVID-19. Institutional protocols and algorithms that pertain to the evaluation of patients at risk for COVID-19 are in a state of  rapid change based on information released by regulatory bodies including the CDC and federal and state organizations. These policies and algorithms were followed during the patient's care in the ED.  Some ED evaluations and interventions may be delayed as a result of limited staffing during and the pandemic.*   Note:  This document was prepared using Dragon voice recognition software and may include unintentional dictation errors.    Nena Polio, MD 07/03/20 2006 Patient now complaining of some pain in the right shoulder.  On palpation she has pain there which is the same pain she is complaining about.  EKG shows normal sinus rhythm rate of 72 normal axis there is a flipped T in 3 and some downsloping of the ST T wave in aVF which were not really present previously.  Additionally there is a hint of ST segment downsloping in V5 and 6 words which were not really present on the EKG from earlier today either.  They were however present in September when her heart rate was 117.  Patient's blood pressure is up additionally at 168/93.  I am going to give her 5 of labetalol IV.  We will repeat the EKG in a few minutes and check a troponin.   Nena Polio, MD 07/03/20 2342

## 2020-07-03 NOTE — Progress Notes (Signed)
PHARMACIST CODE STROKE RESPONSE  Notified to mix tPA at 18:22 by Dr. Darnelle Catalan Delivered tPA to RN at 18:50  tPA dose = 5.9 mg bolus over 1 minute followed by mg for a total dose of 54 mg over 1 hour  Issues/delays encountered (if applicable): RN did not want to administer tpa in ED hallway, insisted on having room.   Edell Mesenbrink D 07/03/20 7:07 PM

## 2020-07-04 ENCOUNTER — Inpatient Hospital Stay (HOSPITAL_COMMUNITY): Payer: Medicare Other

## 2020-07-04 ENCOUNTER — Inpatient Hospital Stay (HOSPITAL_COMMUNITY)
Admission: AD | Admit: 2020-07-04 | Discharge: 2020-07-09 | DRG: 065 | Disposition: A | Discharge status: Home Health | Payer: Medicare Other | Source: Other Acute Inpatient Hospital | Attending: Neurology | Admitting: Neurology

## 2020-07-04 ENCOUNTER — Encounter (HOSPITAL_COMMUNITY): Payer: Self-pay | Admitting: Neurology

## 2020-07-04 ENCOUNTER — Other Ambulatory Visit: Payer: Self-pay

## 2020-07-04 DIAGNOSIS — I6389 Other cerebral infarction: Secondary | ICD-10-CM

## 2020-07-04 DIAGNOSIS — R0789 Other chest pain: Secondary | ICD-10-CM | POA: Diagnosis not present

## 2020-07-04 DIAGNOSIS — Z9282 Status post administration of tPA (rtPA) in a different facility within the last 24 hours prior to admission to current facility: Secondary | ICD-10-CM | POA: Diagnosis not present

## 2020-07-04 DIAGNOSIS — R29810 Facial weakness: Secondary | ICD-10-CM | POA: Diagnosis present

## 2020-07-04 DIAGNOSIS — Z79891 Long term (current) use of opiate analgesic: Secondary | ICD-10-CM | POA: Diagnosis not present

## 2020-07-04 DIAGNOSIS — G40909 Epilepsy, unspecified, not intractable, without status epilepticus: Secondary | ICD-10-CM | POA: Diagnosis present

## 2020-07-04 DIAGNOSIS — I63312 Cerebral infarction due to thrombosis of left middle cerebral artery: Secondary | ICD-10-CM | POA: Diagnosis not present

## 2020-07-04 DIAGNOSIS — Z833 Family history of diabetes mellitus: Secondary | ICD-10-CM

## 2020-07-04 DIAGNOSIS — E1151 Type 2 diabetes mellitus with diabetic peripheral angiopathy without gangrene: Secondary | ICD-10-CM | POA: Diagnosis present

## 2020-07-04 DIAGNOSIS — R471 Dysarthria and anarthria: Secondary | ICD-10-CM | POA: Diagnosis present

## 2020-07-04 DIAGNOSIS — Z7902 Long term (current) use of antithrombotics/antiplatelets: Secondary | ICD-10-CM

## 2020-07-04 DIAGNOSIS — F17200 Nicotine dependence, unspecified, uncomplicated: Secondary | ICD-10-CM | POA: Diagnosis not present

## 2020-07-04 DIAGNOSIS — I6300 Cerebral infarction due to thrombosis of unspecified precerebral artery: Secondary | ICD-10-CM | POA: Diagnosis not present

## 2020-07-04 DIAGNOSIS — Z888 Allergy status to other drugs, medicaments and biological substances status: Secondary | ICD-10-CM

## 2020-07-04 DIAGNOSIS — Z89421 Acquired absence of other right toe(s): Secondary | ICD-10-CM | POA: Diagnosis not present

## 2020-07-04 DIAGNOSIS — Z880 Allergy status to penicillin: Secondary | ICD-10-CM | POA: Diagnosis not present

## 2020-07-04 DIAGNOSIS — Z716 Tobacco abuse counseling: Secondary | ICD-10-CM

## 2020-07-04 DIAGNOSIS — E785 Hyperlipidemia, unspecified: Secondary | ICD-10-CM | POA: Diagnosis present

## 2020-07-04 DIAGNOSIS — Z9114 Patient's other noncompliance with medication regimen: Secondary | ICD-10-CM | POA: Diagnosis not present

## 2020-07-04 DIAGNOSIS — M25511 Pain in right shoulder: Secondary | ICD-10-CM | POA: Diagnosis present

## 2020-07-04 DIAGNOSIS — Z7982 Long term (current) use of aspirin: Secondary | ICD-10-CM

## 2020-07-04 DIAGNOSIS — R29708 NIHSS score 8: Secondary | ICD-10-CM | POA: Diagnosis present

## 2020-07-04 DIAGNOSIS — Z881 Allergy status to other antibiotic agents status: Secondary | ICD-10-CM

## 2020-07-04 DIAGNOSIS — E7800 Pure hypercholesterolemia, unspecified: Secondary | ICD-10-CM | POA: Diagnosis not present

## 2020-07-04 DIAGNOSIS — I6381 Other cerebral infarction due to occlusion or stenosis of small artery: Secondary | ICD-10-CM | POA: Diagnosis present

## 2020-07-04 DIAGNOSIS — I639 Cerebral infarction, unspecified: Secondary | ICD-10-CM | POA: Diagnosis present

## 2020-07-04 DIAGNOSIS — G8191 Hemiplegia, unspecified affecting right dominant side: Secondary | ICD-10-CM | POA: Diagnosis present

## 2020-07-04 DIAGNOSIS — I10 Essential (primary) hypertension: Secondary | ICD-10-CM | POA: Diagnosis not present

## 2020-07-04 DIAGNOSIS — F17210 Nicotine dependence, cigarettes, uncomplicated: Secondary | ICD-10-CM | POA: Diagnosis present

## 2020-07-04 DIAGNOSIS — Z79899 Other long term (current) drug therapy: Secondary | ICD-10-CM | POA: Diagnosis not present

## 2020-07-04 DIAGNOSIS — Z794 Long term (current) use of insulin: Secondary | ICD-10-CM

## 2020-07-04 DIAGNOSIS — E1165 Type 2 diabetes mellitus with hyperglycemia: Secondary | ICD-10-CM | POA: Diagnosis present

## 2020-07-04 DIAGNOSIS — I6350 Cerebral infarction due to unspecified occlusion or stenosis of unspecified cerebral artery: Secondary | ICD-10-CM | POA: Diagnosis present

## 2020-07-04 LAB — GLUCOSE, CAPILLARY
Glucose-Capillary: 191 mg/dL — ABNORMAL HIGH (ref 70–99)
Glucose-Capillary: 192 mg/dL — ABNORMAL HIGH (ref 70–99)
Glucose-Capillary: 182 mg/dL — ABNORMAL HIGH (ref 70–99)
Glucose-Capillary: 104 mg/dL — ABNORMAL HIGH (ref 70–99)
Glucose-Capillary: 239 mg/dL — ABNORMAL HIGH (ref 70–99)

## 2020-07-04 LAB — ECHOCARDIOGRAM COMPLETE
Area-P 1/2: 3.23 cm2
S' Lateral: 2.9 cm

## 2020-07-04 LAB — LIPID PANEL
Cholesterol: 190 mg/dL (ref 0–200)
HDL: 32 mg/dL — ABNORMAL LOW (ref >40)
LDL Cholesterol: 97 mg/dL (ref 0–99)
Total CHOL/HDL Ratio: 5.9 RATIO
Triglycerides: 305 mg/dL — ABNORMAL HIGH (ref <150)
VLDL: 61 mg/dL — ABNORMAL HIGH (ref 0–40)

## 2020-07-04 LAB — CBC
HCT: 33.1 % — ABNORMAL LOW (ref 36.0–46.0)
Hemoglobin: 11.7 g/dL — ABNORMAL LOW (ref 12.0–15.0)
MCH: 28.4 pg (ref 26.0–34.0)
MCHC: 35.3 g/dL (ref 30.0–36.0)
MCV: 80.3 fL (ref 80.0–100.0)
Platelets: 408 10*3/uL — ABNORMAL HIGH (ref 150–400)
RBC: 4.12 MIL/uL (ref 3.87–5.11)
RDW: 13 % (ref 11.5–15.5)
WBC: 10.7 10*3/uL — ABNORMAL HIGH (ref 4.0–10.5)
nRBC: 0 % (ref 0.0–0.2)

## 2020-07-04 LAB — HEMOGLOBIN A1C
Hgb A1c MFr Bld: 8.1 % — ABNORMAL HIGH (ref 4.8–5.6)
Mean Plasma Glucose: 185.77 mg/dL

## 2020-07-04 LAB — MRSA PCR SCREENING: MRSA by PCR: NEGATIVE

## 2020-07-04 MED ORDER — SODIUM CHLORIDE 0.9 % IV SOLN
50.0000 mL/h | INTRAVENOUS | Status: DC
  Administered 2020-07-04 – 2020-07-05 (×3): 50 mL/h via INTRAVENOUS

## 2020-07-04 MED ORDER — ATORVASTATIN CALCIUM 40 MG PO TABS
40.0000 mg | ORAL_TABLET | Freq: Every day | ORAL | Status: DC
  Administered 2020-07-04 – 2020-07-09 (×6): 40 mg via ORAL
  Filled 2020-07-04 (×6): qty 1

## 2020-07-04 MED ORDER — ORAL CARE MOUTH RINSE
15.0000 mL | Freq: Two times a day (BID) | OROMUCOSAL | Status: DC
  Administered 2020-07-04 – 2020-07-05 (×4): 15 mL via OROMUCOSAL

## 2020-07-04 MED ORDER — INSULIN ASPART 100 UNIT/ML ~~LOC~~ SOLN
0.0000 [IU] | SUBCUTANEOUS | Status: DC
  Administered 2020-07-04 (×3): 3 [IU] via SUBCUTANEOUS

## 2020-07-04 MED ORDER — NORTRIPTYLINE HCL 10 MG PO CAPS
40.0000 mg | ORAL_CAPSULE | Freq: Every day | ORAL | Status: DC
Start: 2020-07-04 — End: 2020-07-09
  Administered 2020-07-04: 40 mg via ORAL
  Filled 2020-07-04 (×6): qty 4

## 2020-07-04 MED ORDER — ACETAMINOPHEN 325 MG PO TABS
650.0000 mg | ORAL_TABLET | ORAL | Status: DC | PRN
  Administered 2020-07-04 – 2020-07-07 (×6): 650 mg via ORAL
  Filled 2020-07-04 (×8): qty 2

## 2020-07-04 MED ORDER — ZONISAMIDE 100 MG PO CAPS
100.0000 mg | ORAL_CAPSULE | Freq: Every day | ORAL | Status: DC
  Administered 2020-07-04 – 2020-07-08 (×5): 100 mg via ORAL
  Filled 2020-07-04 (×6): qty 1

## 2020-07-04 MED ORDER — CHLORHEXIDINE GLUCONATE CLOTH 2 % EX PADS: 6.0000 | MEDICATED_PAD | Freq: Every day | CUTANEOUS | Status: DC

## 2020-07-04 MED ORDER — CHLORHEXIDINE GLUCONATE CLOTH 2 % EX PADS
6.0000 | MEDICATED_PAD | Freq: Every day | CUTANEOUS | Status: DC
  Administered 2020-07-04 – 2020-07-05 (×2): 6 via TOPICAL

## 2020-07-04 MED ORDER — ACETAMINOPHEN 650 MG RE SUPP: 650.0000 mg | RECTAL | Status: DC | PRN

## 2020-07-04 MED ORDER — PANTOPRAZOLE SODIUM 40 MG IV SOLR
40.0000 mg | Freq: Every day | INTRAVENOUS | Status: DC
  Administered 2020-07-04: 40 mg via INTRAVENOUS
  Filled 2020-07-04: qty 40

## 2020-07-04 MED ORDER — DULOXETINE HCL 30 MG PO CPEP
30.0000 mg | ORAL_CAPSULE | Freq: Two times a day (BID) | ORAL | Status: DC
  Administered 2020-07-04 (×2): 30 mg via ORAL
  Filled 2020-07-04 (×11): qty 1

## 2020-07-04 MED ORDER — INSULIN ASPART 100 UNIT/ML ~~LOC~~ SOLN
0.0000 [IU] | Freq: Three times a day (TID) | SUBCUTANEOUS | Status: DC
  Administered 2020-07-04: 5 [IU] via SUBCUTANEOUS
  Administered 2020-07-05: 2 [IU] via SUBCUTANEOUS
  Administered 2020-07-05: 3 [IU] via SUBCUTANEOUS
  Administered 2020-07-05: 2 [IU] via SUBCUTANEOUS
  Administered 2020-07-05: 3 [IU] via SUBCUTANEOUS
  Administered 2020-07-06: 5 [IU] via SUBCUTANEOUS
  Administered 2020-07-06 (×2): 3 [IU] via SUBCUTANEOUS
  Administered 2020-07-06: 2 [IU] via SUBCUTANEOUS
  Administered 2020-07-07 (×2): 3 [IU] via SUBCUTANEOUS
  Administered 2020-07-08 (×3): 2 [IU] via SUBCUTANEOUS
  Administered 2020-07-08 – 2020-07-09 (×2): 3 [IU] via SUBCUTANEOUS

## 2020-07-04 MED ORDER — ACETAMINOPHEN 160 MG/5ML PO SOLN: 650.0000 mg | ORAL | Status: DC | PRN

## 2020-07-04 MED ORDER — STROKE: EARLY STAGES OF RECOVERY BOOK
Freq: Once | Status: AC
  Filled 2020-07-04: qty 1

## 2020-07-04 NOTE — H&P (Signed)
Neurology H&P  CC: Right-sided weakness  History is obtained from: Patient  HPI: Kristy Mcmillan is a 45 y.o. female who was in her normal state of health earlier today.  She still felt relatively normal when she lay down at 3 PM, though after thinking about it earlier today she thinks she might have been having symptoms few minutes prior to lying down.  When she awoke, she had a severe right-sided weakness and was taken to [facility] where she was evaluated via teleneurology and TPA was administered.   She has a possible history of seizures, takes daily zonisamide.  LKW: 3 PM tpa given?:  Yes IR Thrombectomy? No, no LVO   ROS: A complete ROS was performed and is negative except as noted in the HPI.  Past Medical History:  Diagnosis Date  . Diabetes mellitus without complication (HCC)   . Seizures (HCC) 2005 Last sz   childhood febrile sz, then ?eclampsia     Family History  Problem Relation Age of Onset  . Diabetes Mother   . Diabetes Father      Social History:  reports that she has been smoking. She has never used smokeless tobacco. She reports that she does not drink alcohol and does not use drugs.   Prior to Admission medications   Medication Sig Start Date End Date Taking? Authorizing Provider  albuterol (VENTOLIN HFA) 108 (90 Base) MCG/ACT inhaler INHALE 2 PUFFS BY MOUTH INTO THE LUNGS EVERY 4 HOURS AS NEEDED 01/27/20   [provider]  aspirin EC 81 MG EC tablet Take 1 tablet (81 mg total) by mouth daily. Swallow whole. 03/18/20   Lynn Ito, MD  atorvastatin (LIPITOR) 40 MG tablet Take 40 mg by mouth at bedtime. Patient not taking: No sig reported 11/29/19   [provider]  calcium-vitamin D (OSCAL WITH D) 500-200 MG-UNIT tablet Take 1 tablet by mouth. Patient not taking: Reported on 07/03/2020    [provider]  cholecalciferol (VITAMIN D) 25 MCG (1000 UT) tablet Take 1,000 Units by mouth daily. Patient not taking: Reported on  07/03/2020    [provider]  clopidogrel (PLAVIX) 75 MG tablet Take 1 tablet (75 mg total) by mouth daily with breakfast. Patient not taking: No sig reported 03/18/20   Lynn Ito, MD  DULoxetine (CYMBALTA) 30 MG capsule Take 30 mg by mouth 2 (two) times daily. Patient not taking: Reported on 07/03/2020 06/05/20   [provider]  ferrous sulfate 325 (65 FE) MG tablet Take 1 tablet (325 mg total) by mouth daily with breakfast. Patient not taking: No sig reported 03/18/20   Lynn Ito, MD  fluticasone (FLONASE) 50 MCG/ACT nasal spray Place 2 sprays into both nostrils daily. 12/01/19   [provider]  ibuprofen (ADVIL) 800 MG tablet Take 800 mg by mouth every 8 (eight) hours as needed for pain. 04/25/20   [provider]  insulin detemir (LEVEMIR) 100 UNIT/ML FlexPen Inject 10 Units into the skin at bedtime.  Patient not taking: No sig reported 01/20/20 01/19/21  [provider]  linagliptin (TRADJENTA) 5 MG TABS tablet Take 5 mg by mouth daily.    [provider]  linezolid (ZYVOX) 600 MG tablet Take 600 mg by mouth 2 (two) times daily. 04/03/20   [provider]  lisinopril (ZESTRIL) 2.5 MG tablet Take 2.5 mg by mouth daily. Patient not taking: No sig reported    [provider]  nortriptyline (PAMELOR) 10 MG capsule Take 40 mg by mouth  at bedtime. 06/06/20   [provider]  omeprazole (PRILOSEC) 20 MG capsule Take 20 mg by mouth daily. 03/08/20   [provider]  traMADol (ULTRAM) 50 MG tablet Take 75 mg by mouth 2 (two) times daily. 06/05/20   [provider]  vitamin B-12 (CYANOCOBALAMIN) 500 MCG tablet Take 500 mcg by mouth daily. Patient not taking: Reported on 07/03/2020    [provider]  zonisamide (ZONEGRAN) 100 MG capsule Take 100 mg by mouth daily.  Patient not taking: Reported on 07/03/2020 01/26/20 01/25/21  [provider]     Exam: Current vital signs: BP (!) 127/91    Pulse 91   Temp 97.9 F (36.6 C) (Oral)   Resp 15   SpO2 99%    Physical Exam  Constitutional: Appears well-developed and well-nourished.  Psych: Affect appropriate to situation Eyes: No scleral injection HENT: Poor dentition Head: Normocephalic.  Cardiovascular: Normal rate and regular rhythm.  Respiratory: Effort normal and breath sounds normal to anterior ascultation GI: Soft.  No distension. There is no tenderness.  Skin: WDI  Neuro: Mental Status: Patient is awake, alert, oriented to person, place, month, year, and situation. Patient is able to give a clear and coherent history. No signs of aphasia or neglect Cranial Nerves: II: Visual Fields are full. Pupils are equal, round, and reactive to light.   III,IV, VI: EOMI without ptosis or diploplia.  V: Facial sensation is symmetric to temperature VII: Facial movement with right facial weakness VIII: hearing is intact to voice X: Uvula elevates symmetrically XI: Shoulder shrug is symmetric. XII: tongue is midline without atrophy or fasciculations.  Motor: Tone is normal. Bulk is normal. 5/5 strength was present on the left, on the right she has 3/5 weakness of the right upper extremity, 2/5 in the right lower extremity Sensory: Sensation is symmetric to light touch and temperature in the arms and legs. Cerebellar: FNF intact on the left   I have reviewed labs in epic and the pertinent results are: Glucose 271   I have reviewed the images obtained: CTA-multifocal posterior cerebral artery stenosis  Primary Diagnosis:  Cerebral infarction due to occlusion or stenosis of unspecified cerebral artery.   Secondary Diagnosis: Type 2 diabetes mellitus with hyperglycemia    Impression: 45 year old female with a history of stroke in the setting of diabetes who presents with isolated right-sided weakness consistent with a subcortical stroke.  She has received TPA and will need risk factor modification and physical  therapy.  Plan: - HgbA1c, fasting lipid panel - MRI  of the brain without contrast - Frequent neuro checks - Echocardiogram - Prophylactic therapy-none for 24 hours - Risk factor modification - Telemetry monitoring - PT consult, OT consult, Speech consult - Stroke team to follow -SSI for DM -Continue home Pamelor, Cymbalta,   Roland Rack, MD [facility] [phone]  If 7pm- 7am, please page neurology on call as listed in [place].

## 2020-07-04 NOTE — Evaluation (Signed)
Speech Language Pathology Evaluation Patient Details Name: Kristy Mcmillan MRN: [id number] DOB: Sep 14, 1975 Today's Date: 07/04/2020 Time: 1025-8527 SLP Time Calculation (min) (ACUTE ONLY): 22 min  Problem List:  Patient Active Problem List   Diagnosis Date Noted  . Stroke (cerebrum) (HCC) 07/04/2020  . Iron deficiency anemia 03/27/2020  . Lower extremity cellulitis 03/09/2020  . Vision changes 01/25/2020  . Bilateral foot pain 03/28/2019  . Low back pain 03/28/2019  . Numbness and tingling of both feet 03/28/2019  . Neuropathic pain of both legs 02/24/2019  . B12 deficiency 01/28/2019  . Vitamin D deficiency 01/28/2019  . Cold feet 11/17/2018  . 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 Pain in joint, lower leg 11/04/2018  . PAD (peripheral artery disease) (HCC) 11/04/2018  . Morbid obesity (HCC) 07/19/2013  . IUFD (intrauterine fetal death) 2013-03-11  . GERD (gastroesophageal reflux disease) 12/01/2012  . Tick bite of abdomen 12/01/2012  . Previous cesarean section complicating pregnancy, antepartum condition or complication 10/25/2012  . Headache in pregnancy, antepartum 10/25/2012  . DM (diabetes mellitus), type 1 (HCC) 09/22/2012  . Seizure disorder (HCC) 09/22/2012   Past Medical History:  Past Medical History:  Diagnosis Date  . Diabetes mellitus without complication (HCC)   . Seizures (HCC) 2005 Last sz   childhood febrile sz, then ?eclampsia   Past Surgical History:  Past Surgical History:  Procedure Laterality Date  . AMPUTATION TOE Right 03/10/2020   Procedure: AMPUTATION FIFTH RAY WITH IRRIGATION AND DEBRIDEMENT RIGHT FOOT;  Surgeon: Linus Galas, DPM;  Location: [facility] ORS;  Service: Podiatry;  Laterality: Right;  . CESAREAN SECTION    . CESAREAN SECTION N/A 2013/03/11   Procedure: CESAREAN SECTION;  Surgeon: Lazaro Arms, MD;  Location: [facility] ORS;  Service: Obstetrics;  Laterality: N/A;  Repeat Cesarean Section Delivery Nonviable Baby Girl @  2158  . IRRIGATION AND DEBRIDEMENT FOOT Right 03/15/2020   Procedure: IRRIGATION AND DEBRIDEMENT FOOT;  Surgeon: Linus Galas, DPM;  Location: [facility] ORS;  Service: Podiatry;  Laterality: Right;  . LOWER EXTREMITY ANGIOGRAPHY Right 03/13/2020   Procedure: Lower Extremity Angiography;  Surgeon: Renford Dills, MD;  Location: [facility] INVASIVE CV LAB;  Service: Cardiovascular;  Laterality: Right;   HPI:  Sybel D Fredman is a 45 y.o. admitted severe right-sided weakness, taken to [facility], TPA was administered. CT There is no acute intracranial hemorrhage or evidence of acute infarction. Small chronic left thalamic infarct. PMH: seizures, DM   Assessment / Plan / Recommendation Clinical Impression  Pt exhibits moderate dysarthria and cognitive impairments per the SLUMS examination and scored 19/30. Her working memory for 5 word recall was within normal limits with 5/5  accurate. Challenges included attention to and recall of paragraph length information, mental calculation, mild divergent naming difficulties, awareness. Speech clarity decreased marked by low intensity and imprecise articulatroy execution. ST recommends continued treatment in acute and next venue to maximize function.    SLP Assessment  SLP Recommendation/Assessment: Patient needs continued Speech Lanaguage Pathology Services SLP Visit Diagnosis: Dysarthria and anarthria (R47.1);Cognitive communication deficit (R41.841)    Follow Up Recommendations  Inpatient Rehab    Frequency and Duration min 2x/week  2 weeks      SLP Evaluation Cognition  Overall Cognitive Status: Impaired/Different from baseline (??) Arousal/Alertness: Awake/alert Orientation Level: Oriented X4 Attention: Sustained Sustained Attention: Appears intact Memory: Appears intact (functional for 4 word recall) Awareness: Impaired Awareness Impairment: Anticipatory impairment Problem Solving: Impaired Problem Solving Impairment: Verbal  complex Executive Function:  (  planning, execution suspect) Behaviors: Other (comment);Lability (slightly) Safety/Judgment: Impaired       Comprehension  Auditory Comprehension Overall Auditory Comprehension: Appears within functional limits for tasks assessed Visual Recognition/Discrimination Discrimination: Not tested Reading Comprehension Reading Status:  (TBA)    Expression Expression Primary Mode of Expression: Verbal Verbal Expression Overall Verbal Expression: Appears within functional limits for tasks assessed Initiation: No impairment Naming: No impairment Pragmatics: No impairment Written Expression Dominant Hand: Left   Oral / Motor  Oral Motor/Sensory Function Overall Oral Motor/Sensory Function: Mild impairment Facial ROM: Other (Comment) (no sig difference) Lingual ROM:  (imprecise) Motor Speech Overall Motor Speech: Impaired Respiration: Within functional limits Phonation: Low vocal intensity Resonance: Within functional limits Articulation: Impaired Level of Impairment: Phrase Intelligibility: Intelligibility reduced Word: 75-100% accurate Phrase: 75-100% accurate Sentence: 50-74% accurate Conversation: 50-74% accurate Motor Planning: Witnin functional limits   GO                    Houston Siren 07/04/2020, 11:03 AM  Orbie Pyo Colvin Caroli.Ed Risk analyst [phone] Office [phone]

## 2020-07-04 NOTE — Progress Notes (Signed)
STROKE TEAM PROGRESS NOTE   INTERVAL HISTORY I have personally reviewed history of presenting illness with the patient, electronic medical records and pertinent imaging films in PACS. She continues to have some facial weakness and right hemiparesis which appears unchanged. Vital signs are stable.. CT scan of the head showed no acute abnormality and MRI scan is pending. Patient received IV TPA without complications. Blood pressure adequately controlled Complaining of chronic right shoulder pain.    Vitals:   07/04/20 0400 07/04/20 0500 07/04/20 0600 07/04/20 0700  BP: 110/83 123/82 132/78 115/85  Pulse: 95 83 79 82  Resp: 14 12 20 14   Temp: 97.7 F (36.5 C)   98.8 F (37.1 C)  TempSrc: Axillary   Oral  SpO2: 100% 99% 100% 100%   CBC:  Recent Labs  Lab 07/03/20 1803 07/04/20 0251  WBC 8.9 10.7*  NEUTROABS 4.6  --   HGB 11.5* 11.7*  HCT 33.4* 33.1*  MCV 81.5 80.3  PLT 427* 408*   Basic Metabolic Panel:  Recent Labs  Lab 07/03/20 1803  NA 138  K 4.0  CL 106  CO2 26  GLUCOSE 271*  BUN 30*  CREATININE 0.94  CALCIUM 9.0   Lipid Panel:  Recent Labs  Lab 07/04/20 0251  CHOL 190  TRIG 305*  HDL 32*  CHOLHDL 5.9  VLDL 61*  LDLCALC 97   HgbA1c:  Recent Labs  Lab 07/04/20 0251  HGBA1C 8.1*   Urine Drug Screen: No results for input(s): LABOPIA, COCAINSCRNUR, LABBENZ, AMPHETMU, THCU, LABBARB in the last 168 hours.  Alcohol Level No results for input(s): ETH in the last 168 hours.  IMAGING past 24 hours CT Angio Head W or Wo Contrast  Result Date: 07/03/2020 CLINICAL DATA:  Initial evaluation for possible stroke, neuro deficit, right-sided weakness, slurred speech. EXAM: CT ANGIOGRAPHY HEAD AND NECK TECHNIQUE: Multidetector CT imaging of the head and neck was performed using the standard protocol during bolus administration of intravenous contrast. Multiplanar CT image reconstructions and MIPs were obtained to evaluate the vascular anatomy. Carotid stenosis  measurements (when applicable) are obtained utilizing NASCET criteria, using the distal internal carotid diameter as the denominator. CONTRAST:  11mL OMNIPAQUE IOHEXOL 350 MG/ML SOLN COMPARISON:  Prior CT from earlier the same day. FINDINGS: CTA NECK FINDINGS Aortic arch: Visualized aortic arch of normal caliber with normal branch pattern. No hemodynamically significant stenosis seen about the origin of the great vessels. Right carotid system: Right common carotid artery widely patent from its origin to the bifurcation. Minimal centric calcified plaque at the right bifurcation without stenosis. Right ICA widely patent distally without stenosis, dissection or occlusion. Left carotid system: Left common and internal carotid arteries widely patent without stenosis, dissection or occlusion. No significant atheromatous narrowing about the left bifurcation. Vertebral arteries: Both vertebral arteries arise from subclavian arteries. No proximal subclavian artery stenosis. Vertebral arteries widely patent without stenosis, dissection or occlusion. Skeleton: No acute osseous abnormality. No discrete or worrisome osseous lesions. Poor dentition noted. Other neck: No other acute soft tissue abnormality within the neck. No mass or adenopathy. Upper chest: Visualized upper chest demonstrates no acute finding. Scattered atelectatic changes noted within the visualized lungs. Review of the MIP images confirms the above findings CTA HEAD FINDINGS Anterior circulation: Petrous segments widely patent. Mild atheromatous change within the carotid siphons without hemodynamically significant stenosis. A1 segments widely patent. Normal anterior communicating artery complex. Anterior cerebral arteries patent to their distal aspects without proximal high-grade stenosis. No M1 stenosis or occlusion. Normal MCA  bifurcations. No proximal MCA branch occlusion. MCA branches well perfused and symmetric bilaterally. Mild to moderate small vessel  atheromatous irregularity, advanced for age. Posterior circulation: Both V4 segments widely patent to the vertebrobasilar junction. Both PICA origins patent and normal. Basilar widely patent to its distal aspect without stenosis. Superior cerebral arteries patent bilaterally. Both PCAs primarily supplied via the basilar. Multifocal moderate to severe stenoses noted throughout the left P1 and P2 segments. Short-segment severe right P1 stenosis noted (series 606, image 94). Both PCAs remain perfused to their distal aspects. Venous sinuses: Patent. Anatomic variants: None significant.  No intracranial aneurysm. Review of the MIP images confirms the above findings IMPRESSION: 1. Negative CTA for emergent large vessel occlusion. 2. 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 3. Distal small vessel atheromatous irregularity about the intracranial circulation, advanced for age. 4. Wide patency of the major arterial vasculature of the neck. Electronically Signed   By: Jeannine Boga M.D.   On: 07/03/2020 19:19   CT HEAD CODE STROKE WO CONTRAST  Result Date: 07/03/2020 CLINICAL DATA:  Code stroke.  Difficulty moving right arm EXAM: CT HEAD WITHOUT CONTRAST TECHNIQUE: Contiguous axial images were obtained from the base of the skull through the vertex without intravenous contrast. COMPARISON:  March 12, 2020 FINDINGS: Brain: There is no acute intracranial hemorrhage, mass effect, or edema. Gray-white differentiation is preserved. There is a small chronic infarct of the medial left thalamus. Ventricles and sulci are normal in size and configuration. There is no extra-axial collection. Vascular: No hyperdense vessel. Mild intracranial atherosclerotic calcification is present at the skull base. Skull: Unremarkable. Sinuses/Orbits: Mild deformity of the medial right orbital wall may reflect prior trauma. Other: Minimal mastoid opacification. ASPECTS ([facility] Stroke Program Early  CT Score) - Ganglionic level infarction (caudate, lentiform nuclei, internal capsule, insula, M1-M3 cortex): 7 - Supraganglionic infarction (M4-M6 cortex): 3 Total score (0-10 with 10 being normal): 10 IMPRESSION: There is no acute intracranial hemorrhage or evidence of acute infarction. ASPECT score is 10. Small chronic left thalamic infarct. These results were called by telephone at the time of interpretation on 07/03/2020 at 6:15 pm to provider Kristy Mcmillan , who verbally acknowledged these results. Electronically Signed   By: Macy Mis M.D.   On: 07/03/2020 18:17   CT ANGIO NECK CODE STROKE  Result Date: 07/03/2020 CLINICAL DATA:  Initial evaluation for possible stroke, neuro deficit, right-sided weakness, slurred speech. EXAM: CT ANGIOGRAPHY HEAD AND NECK TECHNIQUE: Multidetector CT imaging of the head and neck was performed using the standard protocol during bolus administration of intravenous contrast. Multiplanar CT image reconstructions and MIPs were obtained to evaluate the vascular anatomy. Carotid stenosis measurements (when applicable) are obtained utilizing NASCET criteria, using the distal internal carotid diameter as the denominator. CONTRAST:  69mL OMNIPAQUE IOHEXOL 350 MG/ML SOLN COMPARISON:  Prior CT from earlier the same day. FINDINGS: CTA NECK FINDINGS Aortic arch: Visualized aortic arch of normal caliber with normal branch pattern. No hemodynamically significant stenosis seen about the origin of the great vessels. Right carotid system: Right common carotid artery widely patent from its origin to the bifurcation. Minimal centric calcified plaque at the right bifurcation without stenosis. Right ICA widely patent distally without stenosis, dissection or occlusion. Left carotid system: Left common and internal carotid arteries widely patent without stenosis, dissection or occlusion. No significant atheromatous narrowing about the left bifurcation. Vertebral arteries: Both vertebral  arteries arise from subclavian arteries. No proximal subclavian artery stenosis. Vertebral arteries widely  patent without stenosis, dissection or occlusion. Skeleton: No acute osseous abnormality. No discrete or worrisome osseous lesions. Poor dentition noted. Other neck: No other acute soft tissue abnormality within the neck. No mass or adenopathy. Upper chest: Visualized upper chest demonstrates no acute finding. Scattered atelectatic changes noted within the visualized lungs. Review of the MIP images confirms the above findings CTA HEAD FINDINGS Anterior circulation: Petrous segments widely patent. Mild atheromatous change within the carotid siphons without hemodynamically significant stenosis. A1 segments widely patent. Normal anterior communicating artery complex. Anterior cerebral arteries patent to their distal aspects without proximal high-grade stenosis. No M1 stenosis or occlusion. Normal MCA bifurcations. No proximal MCA branch occlusion. MCA branches well perfused and symmetric bilaterally. Mild to moderate small vessel atheromatous irregularity, advanced for age. Posterior circulation: Both V4 segments widely patent to the vertebrobasilar junction. Both PICA origins patent and normal. Basilar widely patent to its distal aspect without stenosis. Superior cerebral arteries patent bilaterally. Both PCAs primarily supplied via the basilar. Multifocal moderate to severe stenoses noted throughout the left P1 and P2 segments. Short-segment severe right P1 stenosis noted (series 606, image 94). Both PCAs remain perfused to their distal aspects. Venous sinuses: Patent. Anatomic variants: None significant.  No intracranial aneurysm. Review of the MIP images confirms the above findings IMPRESSION: 1. Negative CTA for emergent large vessel occlusion. 2. 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 3. Distal small vessel atheromatous irregularity about the intracranial  circulation, advanced for age. 4. Wide patency of the major arterial vasculature of the neck. Electronically Signed   By: Rise Mu M.D.   On: 07/03/2020 19:19   PHYSICAL EXAM   Pleasant middle-age Caucasian lady lying comfortably in bed. Not in distress. . Afebrile. Head is nontraumatic. Neck is supple without bruit.    Cardiac exam no murmur or gallop. Lungs are clear to auscultation. Distal pulses are well felt. Neurological Exam : Alert and oriented x3 no aphasia with mild dysarthria. Extraocular movements are full range without nystagmus. Blinks to threat bilaterally. Right lower facial weakness. Tongue midline. Motor system exam shows right hemiparesis with RUE 3/5 distally, proximal weakness vs. Pain limited exam due to chronic right shoulder pain RLE  1/5 normal strength on the left side. Sensation is intact. Coordination impaired on the right. Gait not tested.  ASSESSMENT/PLAN Kristy Mcmillan is a 45 y.o. 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 Also with PAD for which angio was performed on 03/13/20 and underwent PTA of the right tibioperoneal trunk and placed on plavix ASA during same admission. She was noncompliant vs. misinformed and did not follow through with outpatient postoperative antibiotic regimen.  She presented yesterday with isolated right-sided weakness (RUE and RLE) consistent with a subcortical stroke. She stopped taking her Plavix and ASA one week ago because she thought it was causing right shoulder pain. She received TPA at 1906 on 1/4.   Stroke: Left brain stroke s/p IV TPA likely secondary to SVD    Code Stroke: CT head no acute abnormality.  Small chronic left thalamic infarct.   CTA head/neck: Negative for large vessel occlusion. Multifocal moderate to severe bilateral P1 and  P2 stenoses, left        worse than right. Distal small vessel atheromatous irregularity about the  intracranial circulation, advanced for age. Wide patency of  the major arterial vasculature of the neck.   MRI  Pending  2D Echo:pending  LDL 97  HgbA1c 8.1  VTE prophylaxis     Diet   Diet Carb Modified Fluid consistency: Thin; Room service appropriate? Yes     On plavix and ASA prior to admission although not taking xone week, currently s/p TPA with MRI brain or HCT pending this afternoon. Will plan for ASA to start tonight after her 24 hour window is complete  if scan is stable.   Therapy recommendations: pending  Disposition:  TBD Pending evals   Hyperlipidemia  Home meds: atorvastatin 40mg  daily resumed in hospital  LDL 97, goal < 70  Continue statin at discharge  Diabetes type II Uncontrolled  Home meds: Levemir 10u at HS  HgbA1c 8.1, goal < 7.0  CBGs Recent Labs    07/03/20 1818 07/04/20 0341 07/04/20 0734  GLUCAP 235* 191* 192*      SSI  Other Stroke Risk Factors  Current Cigarette smoker: advised to stop smoking  Other Active Problems:  Right shoulder pain Obtain XR right shoulder May use RUE sling for comfort   Seizure Disorder: Continue zonisamide   Hospital day # 0 Continue close neurological monitoring and strict blood pressure control as per post TPA protocol. Mobilize out of bed. Await physical and occupational therapy consults. Check MRI scan, echocardiogram. Check x-ray right shoulder to evaluate her pain may need right upper extremity sling for comfort.    This patient is critically ill and at significant risk of neurological worsening, death and care requires constant monitoring of vital signs, hemodynamics,respiratory and cardiac monitoring, extensive review of multiple databases, frequent neurological assessment, discussion with family, other specialists and medical decision making of high complexity.I have made any additions or  clarifications directly to the above note.This critical care time does not reflect procedure time, or teaching time or supervisory time of PA/NP/Med Resident etc but could involve care discussion time.  I spent 33 minutes of neurocritical care time  in the care of  this patient.  Antony Contras, MD   To contact Stroke Continuity provider, please refer to [email or web address] After hours, contact General Neurology

## 2020-07-04 NOTE — Evaluation (Signed)
Occupational Therapy Evaluation Patient Details Name: Kristy Mcmillan MRN: [id number] DOB: September 24, 1975 Today's Date: 07/04/2020    History of Present Illness Kristy Mcmillan is a 45 y.o. admitted severe right-sided weakness, taken to [facility], TPA was administered. CT There is no acute intracranial hemorrhage or evidence of acute infarction. Small chronic left thalamic infarct. PMH: seizures, DM   Clinical Impression   Pt admitted with above. She demonstrates the below listed deficits and will benefit from continued OT to maximize safety and independence with BADLs.  Pt presents to OT with generalized weakness, impaired balance, pain Rt shoulder, decreased activity tolerance, impaired cognition.  She currently requires set up to total A for ADLs.  She was unable to transfer OOB this date with OT due to c/o pain Rt shoulder and c/o dizziness. She reports she lives with her spouse and sister, and was mod I PTA.  Recommend CIR level rehab.       Follow Up Recommendations  CIR;Supervision/Assistance - 24 hour    Equipment Recommendations  None recommended by OT    Recommendations for Other Services Rehab consult     Precautions / Restrictions Precautions Precautions: Fall      Mobility Bed Mobility Overal bed mobility: Needs Assistance Bed Mobility: Rolling;Sit to Supine;Supine to Sit Rolling: Min assist   Supine to sit: Mod assist Sit to supine: Min assist   General bed mobility comments: Pt required assist to move Rt LE off the bed, and to lift trunk then scoot hips to EOB.   She threw herself back on the bed and lifted LEs back onto the bed without assist, but did require assist with repositioning trunk    Transfers                 General transfer comment: Pt unable to attempt due to dizziness, then c/o pain Rt shoulder    Balance Overall balance assessment: Needs assistance Sitting-balance support: Feet supported Sitting balance-Leahy Scale: Poor Sitting  balance - Comments: requires min A to maintain static sitting balance                                   ADL either performed or assessed with clinical judgement   ADL Overall ADL's : Needs assistance/impaired Eating/Feeding: Set up;Bed level   Grooming: Wash/dry hands;Wash/dry face;Oral care;Brushing hair;Moderate assistance;Bed level   Upper Body Bathing: Maximal assistance;Bed level   Lower Body Bathing: Maximal assistance;Bed level   Upper Body Dressing : Maximal assistance;Bed level   Lower Body Dressing: Total assistance;Bed level   Toilet Transfer: Total assistance Toilet Transfer Details (indicate cue type and reason): unable to progress to OOB Toileting- Clothing Manipulation and Hygiene: Total assistance;Bed level       Functional mobility during ADLs: Maximal assistance       Vision Baseline Vision/History: Wears glasses Wears Glasses: At all times Patient Visual Report: No change from baseline Additional Comments: Pt demonstrates full EOMs without evidence of nystagmus.  She was unable to participate in formal visual assessment     Perception Perception Perception Tested?: Yes   Praxis Praxis Praxis tested?: Within functional limits    Pertinent Vitals/Pain Pain Assessment: Faces Pain Score: 8  Faces Pain Scale: Hurts a little bit Pain Location: shoulder Pain Descriptors / Indicators: Grimacing;Guarding;Moaning Pain Intervention(s): Monitored during session;Repositioned     Hand Dominance Left   Extremity/Trunk Assessment Upper Extremity Assessment Upper Extremity Assessment: RUE deficits/detail RUE  Deficits / Details: Pt c/o Rt shoulder pain that worsened once EOB.  She did tolerate full PROM to shoulder, and reports pain has been chronic in nature and is caused by blood thinners.  She would not attmept to move Rt UE initially, but with encouragement she demonstrates ~75% active elbow flexion, and demonstrates isolated flexion and  extension of digits, but would not make a full fist or perform mass extension of her hand.  Difficult to accurately assess sensation RUE Coordination: decreased fine motor;decreased gross motor   Lower Extremity Assessment Lower Extremity Assessment: Defer to PT evaluation   Cervical / Trunk Assessment Cervical / Trunk Assessment: Normal   Communication Communication Communication: Expressive difficulties   Cognition Arousal/Alertness: Awake/alert Behavior During Therapy: WFL for tasks assessed/performed;Impulsive;Anxious Overall Cognitive Status: No family/caregiver present to determine baseline cognitive functioning                                 General Comments: Pt dysarthric and at times difficult to understand.  She appears to have low health care IQ.  She initially followed commands well, but became anxious with c/o dizziness while sitting EOB, with a delay in responses noted, then she spontaneously threw herself back on the bed screaming with Rt shoulder pain, and in tears that she "can't do this".  Pt then intermittently answered questions, but followed commands well for repositioning.  Unsure of patient's baseline   General Comments  VSS throughout session.    Exercises     Shoulder Instructions      Home Living Family/patient expects to be discharged to:: Private residence Living Arrangements: Spouse/significant other;Other relatives Available Help at Discharge: Family;Available PRN/intermittently Type of Home: Mobile home Home Access: Ramped entrance     Home Layout: One level     Bathroom Shower/Tub: Teacher, early years/pre: Standard     Home Equipment: Shower seat;Bedside commode;Wheelchair - [employer] - 2 wheels   Additional Comments: Pt reports she lives with spouse, who does not work, and her sister, who is Bipolar rents a room from them  Lives With: Spouse;Other (Comment)    Prior Functioning/Environment Level of  Independence: Independent        Comments: Pt reports she ambulates in her home without AD, and is independent with        OT Problem List: Decreased strength;Decreased range of motion;Decreased activity tolerance;Impaired balance (sitting and/or standing);Decreased coordination;Decreased cognition;Decreased safety awareness;Decreased knowledge of use of DME or AE;Impaired UE functional use;Pain      OT Treatment/Interventions: Self-care/ADL training;Therapeutic exercise;Neuromuscular education;DME and/or AE instruction;Therapeutic activities;Cognitive remediation/compensation;Visual/perceptual remediation/compensation;Patient/family education;Balance training    OT Goals(Current goals can be found in the care plan section) Acute Rehab OT Goals Patient Stated Goal: "I want to go home" OT Goal Formulation: With patient Time For Goal Achievement: 07/18/20 Potential to Achieve Goals: Good ADL Goals Pt Will Perform Grooming: with min assist;sitting Pt Will Perform Upper Body Bathing: with min assist;sitting Pt Will Perform Lower Body Bathing: with mod assist;sit to/from stand Pt Will Perform Upper Body Dressing: with min assist;sitting Pt Will Perform Lower Body Dressing: with mod assist;sit to/from stand Pt Will Transfer to Toilet: with mod assist;ambulating;regular height toilet;bedside commode;grab bars Pt Will Perform Toileting - Clothing Manipulation and hygiene: with mod assist;sit to/from stand Additional ADL Goal #1: Pt will actively participate in therapeutic activity x 25 mins with no more than 1 rest break Additional ADL Goal #2: Pt will reach  for and retrieve object at ~70* shoulder flexion with min A.  OT Frequency: Min 2X/week   Barriers to D/C:            Co-evaluation              AM-PAC OT "6 Clicks" Daily Activity     Outcome Measure Help from another person eating meals?: A Little Help from another person taking care of personal grooming?: A Lot Help from  another person toileting, which includes using toliet, bedpan, or urinal?: Total Help from another person bathing (including washing, rinsing, drying)?: A Lot Help from another person to put on and taking off regular upper body clothing?: A Lot Help from another person to put on and taking off regular lower body clothing?: Total 6 Click Score: 11   End of Session Nurse Communication: Mobility status  Activity Tolerance: Patient limited by pain Patient left: in bed;with call bell/phone within reach;with bed alarm set  OT Visit Diagnosis: Unsteadiness on feet (R26.81);Cognitive communication deficit (R41.841);Pain;Hemiplegia and hemiparesis Symptoms and signs involving cognitive functions: Cerebral infarction Hemiplegia - Right/Left: Right Hemiplegia - dominant/non-dominant: Non-Dominant Hemiplegia - caused by: Cerebral infarction Pain - Right/Left: Right Pain - part of body: Shoulder                Time: [id number] OT Time Calculation (min): 28 min Charges:  OT General Charges $OT Visit: 1 Visit OT Evaluation $OT Eval Moderate Complexity: 1 Mod OT Treatments $Therapeutic Activity: 8-22 mins  Nilsa Nutting., OTR/L Acute Rehabilitation Services Pager [phone] Office [phone]   Lucille Passy M 07/04/2020, 1:36 PM

## 2020-07-04 NOTE — Progress Notes (Signed)
*  PRELIMINARY RESULTS* Echocardiogram 2D Echocardiogram has been performed.  Kristy Mcmillan 07/04/2020, 9:49 AM

## 2020-07-04 NOTE — Evaluation (Signed)
Physical Therapy Evaluation Patient Details Name: Kristy Mcmillan MRN: [id number] DOB: October 16, 1975 Today's Date: 07/04/2020   History of Present Illness  Kristy Mcmillan is a 45 y.o. admitted severe right-sided weakness, taken to [facility], TPA was administered. CT There is no acute intracranial hemorrhage or evidence of acute infarction. Small chronic left thalamic infarct. PMH: seizures, DM  Clinical Impression  Pt admitted with/for severe right-sided weakness which continues on evaluation, but no acute abnormality seen on CT.  Pt needing moderate assist for all basic mobility.  Pt currently limited functionally due to the problems listed. ( See problems list.)   Pt will benefit from PT to maximize function and safety in order to get ready for next venue listed below.     Follow Up Recommendations CIR;Supervision/Assistance - 24 hour    Equipment Recommendations  None recommended by PT    Recommendations for Other Services Rehab consult     Precautions / Restrictions Precautions Precautions: Fall      Mobility  Bed Mobility Overal bed mobility: Needs Assistance Bed Mobility: Rolling;Sit to Supine;Supine to Sit Rolling: Min assist   Supine to sit: Mod assist Sit to supine: Mod assist   General bed mobility comments: pt had difficulty initating roll and needed R UE and truncal assist, with truncal assist up via L elbow and support for sitting and assist to scoot.  LE assist to sidelying.    Transfers Overall transfer level: Needs assistance   Transfers: Sit to/from Stand Sit to Stand: Mod assist         General transfer comment: face to face assist foward and boost with scoot up toward [facility] over 3 stands.  Ambulation/Gait                Stairs            Wheelchair Mobility    Modified Rankin (Stroke Patients Only) Modified Rankin (Stroke Patients Only) Pre-Morbid Rankin Score: No symptoms Modified Rankin: Severe disability     Balance  Overall balance assessment: Needs assistance Sitting-balance support: Feet supported Sitting balance-Leahy Scale: Poor Sitting balance - Comments: requires min A to maintain static sitting balance     Standing balance-Leahy Scale: Poor Standing balance comment: moderate external support.                             Pertinent Vitals/Pain Pain Assessment: Faces Faces Pain Scale: Hurts little more Pain Location: shoulder Pain Descriptors / Indicators: Grimacing;Guarding;Moaning    Home Living Family/patient expects to be discharged to:: Private residence Living Arrangements: Other relatives;Spouse/significant other Available Help at Discharge: Family;Available PRN/intermittently Type of Home: Mobile home Home Access: Ramped entrance     Home Layout: One level Home Equipment: Shower seat;Bedside commode;Wheelchair - [employer] - 2 wheels Additional Comments: Pt reports she lives with spouse, who does not work, and her sister, who is Bipolar rents a room from them    Prior Function Level of Independence: Independent         Comments: Pt reports she ambulates in her home without AD, and is independent with basic ADL's     Hand Dominance   Dominant Hand: Left    Extremity/Trunk Assessment   Upper Extremity Assessment Upper Extremity Assessment: Defer to OT evaluation RUE Coordination: decreased fine motor;decreased gross motor    Lower Extremity Assessment Lower Extremity Assessment: RLE deficits/detail RLE Deficits / Details: weak gross flexion and ext, All movement in synergy RLE Sensation:  decreased light touch RLE Coordination: decreased fine motor;decreased gross motor    Cervical / Trunk Assessment Cervical / Trunk Assessment: Normal  Communication   Communication: Expressive difficulties  Cognition Arousal/Alertness: Awake/alert Behavior During Therapy: WFL for tasks assessed/performed;Impulsive;Anxious Overall Cognitive Status: No  family/caregiver present to determine baseline cognitive functioning                                 General Comments: pt's interactions varying within session from intelligible speech to dysarthric speech to gibberish with delay in responses to questions.      General Comments General comments (skin integrity, edema, etc.): vss throughout session    Exercises     Assessment/Plan    PT Assessment Patient needs continued PT services  PT Problem List Decreased strength;Decreased activity tolerance;Decreased balance;Decreased mobility;Pain;Impaired sensation       PT Treatment Interventions DME instruction;Gait training;Functional mobility training;Therapeutic activities;Neuromuscular re-education;Balance training;Patient/family education    PT Goals (Current goals can be found in the Care Plan section)  Acute Rehab PT Goals Patient Stated Goal: "I want to go home" PT Goal Formulation: With patient Time For Goal Achievement: 07/18/20 Potential to Achieve Goals: Good    Frequency Min 4X/week   Barriers to discharge        Co-evaluation               AM-PAC PT "6 Clicks" Mobility  Outcome Measure Help needed turning from your back to your side while in a flat bed without using bedrails?: A Lot Help needed moving from lying on your back to sitting on the side of a flat bed without using bedrails?: A Lot Help needed moving to and from a bed to a chair (including a wheelchair)?: A Lot Help needed standing up from a chair using your arms (e.g., wheelchair or bedside chair)?: A Lot Help needed to walk in hospital room?: A Lot Help needed climbing 3-5 steps with a railing? : A Lot 6 Click Score: 12    End of Session     Patient left: in bed;with call bell/phone within reach;with bed alarm set Nurse Communication: Mobility status PT Visit Diagnosis: Hemiplegia and hemiparesis;Other abnormalities of gait and mobility (R26.89);Pain;Other symptoms and signs  involving the nervous system (R29.898) Hemiplegia - Right/Left: Right Hemiplegia - dominant/non-dominant: Dominant Hemiplegia - caused by: Other cerebrovascular disease Pain - Right/Left: Right Pain - part of body: Shoulder    Time: 1510-1534 PT Time Calculation (min) (ACUTE ONLY): 24 min   Charges:   PT Evaluation $PT Eval Moderate Complexity: 1 Mod PT Treatments $Therapeutic Activity: 8-22 mins        07/04/2020  Ginger Carne., PT Acute Rehabilitation Services [phone]  (pager) [phone]  (office)  Tessie Fass Jahziah Simonin 07/04/2020, 5:38 PM

## 2020-07-05 DIAGNOSIS — I6300 Cerebral infarction due to thrombosis of unspecified precerebral artery: Secondary | ICD-10-CM | POA: Diagnosis not present

## 2020-07-05 LAB — RAPID URINE DRUG SCREEN, HOSP PERFORMED
Amphetamines: NOT DETECTED
Barbiturates: NOT DETECTED
Benzodiazepines: NOT DETECTED
Cocaine: NOT DETECTED
Opiates: NOT DETECTED
Tetrahydrocannabinol: NOT DETECTED

## 2020-07-05 LAB — GLUCOSE, CAPILLARY
Glucose-Capillary: 168 mg/dL — ABNORMAL HIGH (ref 70–99)
Glucose-Capillary: 189 mg/dL — ABNORMAL HIGH (ref 70–99)
Glucose-Capillary: 135 mg/dL — ABNORMAL HIGH (ref 70–99)
Glucose-Capillary: 133 mg/dL — ABNORMAL HIGH (ref 70–99)

## 2020-07-05 MED ORDER — FLUTICASONE PROPIONATE 50 MCG/ACT NA SUSP
2.0000 | Freq: Every day | NASAL | Status: DC | PRN
  Filled 2020-07-05: qty 16

## 2020-07-05 MED ORDER — ALBUTEROL SULFATE HFA 108 (90 BASE) MCG/ACT IN AERS
1.0000 | INHALATION_SPRAY | Freq: Four times a day (QID) | RESPIRATORY_TRACT | Status: DC | PRN
  Filled 2020-07-05: qty 6.7

## 2020-07-05 MED ORDER — PANTOPRAZOLE SODIUM 40 MG PO TBEC
40.0000 mg | DELAYED_RELEASE_TABLET | Freq: Every day | ORAL | Status: DC
  Administered 2020-07-05 – 2020-07-08 (×4): 40 mg via ORAL
  Filled 2020-07-05 (×4): qty 1

## 2020-07-05 MED ORDER — DULOXETINE HCL 30 MG PO CPEP: 30.0000 mg | ORAL_CAPSULE | Freq: Two times a day (BID) | ORAL | Status: DC

## 2020-07-05 MED ORDER — ASPIRIN 325 MG PO TABS
325.0000 mg | ORAL_TABLET | Freq: Every day | ORAL | Status: DC
  Administered 2020-07-05: 325 mg via ORAL
  Filled 2020-07-05: qty 1

## 2020-07-05 MED ORDER — FLUTICASONE PROPIONATE 50 MCG/ACT NA SUSP: 2.0000 | Freq: Every day | NASAL | Status: DC

## 2020-07-05 MED ORDER — FERROUS SULFATE 325 (65 FE) MG PO TABS
325.0000 mg | ORAL_TABLET | Freq: Every day | ORAL | Status: DC
  Administered 2020-07-06 – 2020-07-09 (×4): 325 mg via ORAL
  Filled 2020-07-05 (×4): qty 1

## 2020-07-05 MED ORDER — CLOPIDOGREL BISULFATE 75 MG PO TABS
75.0000 mg | ORAL_TABLET | Freq: Every day | ORAL | Status: DC
  Administered 2020-07-05 – 2020-07-07 (×3): 75 mg via ORAL
  Filled 2020-07-05 (×5): qty 1

## 2020-07-05 MED ORDER — ASPIRIN 81 MG PO CHEW
81.0000 mg | CHEWABLE_TABLET | Freq: Every day | ORAL | Status: DC
  Administered 2020-07-06 – 2020-07-09 (×4): 81 mg via ORAL
  Filled 2020-07-05 (×4): qty 1

## 2020-07-05 MED ORDER — ASPIRIN 81 MG PO CHEW: 81.0000 mg | CHEWABLE_TABLET | Freq: Every day | ORAL | Status: DC

## 2020-07-05 NOTE — Consult Note (Signed)
Physical Medicine and Rehabilitation Consult Reason for Consult: Right side weakness Referring Physician: Dr. Leonie Man   HPI: Kristy Mcmillan is a 45 y.o. 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  Per chart review patient lives with spouse as well as her sister.  Mobile home with ramped entrance.  Ambulates in her home without assistive device independent with basic ADLs.  Presented 07/05/2019 with right side weakness to [facility].  Cranial CT scan showed no acute changes.  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  CT angiogram of the head and neck with no emergent large vessel occlusion.  Patient did  receive TPA.  MRI showed a 2.5 x 1.4 cm acute infarction within the left corona radiata no mass-effect.  Echocardiogram with ejection fraction of 60 to 65%.  Admission labs hemoglobin A1c 8.1, triglycerides 305.  Currently maintained on Plavix for CVA prophylaxis.  Tolerating a regular consistency diet.  Therapy evaluations completed with recommendations of physical medicine rehab consult.   Pt reports hasn't had seizure since 2005 ISN'T smoker- her husband and sister are.  C/o dizziness and R shoulder pain- chronic- xray (-) for R shoulder fx.  C/o not liking food/cesar salad, but is hungry.  Doesn't remember when had LBM.  Got 19/30 on SLUMS with SLP.   Review of Systems  Constitutional: Negative for chills and fever.  HENT: Negative for hearing loss.   Eyes: Negative for blurred vision and double vision.  Respiratory: Positive for cough. Negative for shortness of breath.   Cardiovascular: Positive for leg swelling. Negative for chest pain and palpitations.  Gastrointestinal: Positive for constipation. Negative for heartburn, nausea and vomiting.  Genitourinary: Negative for dysuria, flank pain and hematuria.  Musculoskeletal: Positive  for joint pain and myalgias.  Skin: Negative for rash.  Neurological: Positive for sensory change and weakness.  All other systems reviewed and are negative.  Past Medical History:  Diagnosis Date  . Diabetes mellitus without complication ([place])   . Seizures ([place]) 2005 Last sz   childhood febrile sz, then ?eclampsia   Past Surgical History:  Procedure Laterality Date  . AMPUTATION TOE Right 03/10/2020   Procedure: AMPUTATION FIFTH RAY WITH IRRIGATION AND DEBRIDEMENT RIGHT FOOT;  Surgeon: Sharlotte Alamo, DPM;  Location: [facility];  Service: Podiatry;  Laterality: Right;  . CESAREAN SECTION    . CESAREAN SECTION N/A 03/02/2013   Procedure: CESAREAN SECTION;  Surgeon: Florian Buff, MD;  Location: [facility];  Service: Obstetrics;  Laterality: N/A;  Repeat Cesarean Section Delivery Nonviable Baby Girl @ 2158  . IRRIGATION AND DEBRIDEMENT FOOT Right 03/15/2020   Procedure: IRRIGATION AND DEBRIDEMENT FOOT;  Surgeon: Sharlotte Alamo, DPM;  Location: [facility];  Service: Podiatry;  Laterality: Right;  . LOWER EXTREMITY ANGIOGRAPHY Right 03/13/2020   Procedure: Lower Extremity Angiography;  Surgeon: Katha Cabal, MD;  Location: [facility];  Service: Cardiovascular;  Laterality: Right;   Family History  Problem Relation Age of Onset  . Diabetes Mother   . Diabetes Father    Social History:  reports that she has been smoking. She has never used smokeless tobacco. She reports that she does not drink alcohol and does not use drugs. Allergies:  Allergies  Allergen Reactions  . Dilantin [Phenytoin Sodium Extended] Other (See Comments)    Causes seizures  . Phenobarbital Other (See Comments)    Causes seizures Other reaction(s):  Unknown Causes seizures  . Phenytoin Sodium Extended     Other reaction(s): Other (See Comments) Causes seizures  . Aspartame Diarrhea and Nausea And Vomiting  . Aspartame And Phenylalanine Diarrhea and Nausea And Vomiting  . Carbamazepine Other (See Comments)     Other reaction(s): Unknown  . Cymbalta [Duloxetine Hcl] Diarrhea  . Gabapentin Other (See Comments)    Other reaction(s): Unknown Feels like she is going to have a seizure  . Ciprofloxacin Rash    blisters  . Humalog [Insulin Lispro] Rash  . Lantus [Insulin Glargine] Rash  . Novolin 70-30 [Insulin Nph Isophane & Regular] Itching and Rash  . Penicillins Itching and Rash   Medications Prior to Admission  Medication Sig Dispense Refill  . albuterol (VENTOLIN HFA) 108 (90 Base) MCG/ACT inhaler Inhale 1 puff into the lungs every 6 (six) hours as needed for wheezing or shortness of breath.    Marland Kitchen atorvastatin (LIPITOR) 40 MG tablet Take 40 mg by mouth at bedtime.    . DULoxetine (CYMBALTA) 30 MG capsule Take 30 mg by mouth 2 (two) times daily.    . fluticasone (FLONASE) 50 MCG/ACT nasal spray Place 2 sprays into both nostrils daily.    Marland Kitchen ibuprofen (ADVIL) 200 MG tablet Take 400 mg by mouth every 6 (six) hours as needed for headache or mild pain.    Marland Kitchen linagliptin (TRADJENTA) 5 MG TABS tablet Take 5 mg by mouth daily.    . Multiple Vitamins-Minerals (MULTIVITAMIN WITH MINERALS) tablet Take 1 tablet by mouth daily.    . nortriptyline (PAMELOR) 10 MG capsule Take 40 mg by mouth at bedtime.    Marland Kitchen omeprazole (PRILOSEC) 20 MG capsule Take 20 mg by mouth daily.    Marland Kitchen zonisamide (ZONEGRAN) 100 MG capsule Take 100 mg by mouth daily.    Marland Kitchen aspirin EC 81 MG EC tablet Take 1 tablet (81 mg total) by mouth daily. Swallow whole. (Patient not taking: Reported on 07/05/2020) 30 tablet 1  . clopidogrel (PLAVIX) 75 MG tablet Take 1 tablet (75 mg total) by mouth daily with breakfast. (Patient not taking: No sig reported) 30 tablet 1  . ferrous sulfate 325 (65 FE) MG tablet Take 1 tablet (325 mg total) by mouth daily with breakfast. (Patient not taking: No sig reported) 30 tablet 1  . lisinopril (ZESTRIL) 2.5 MG tablet Take 2.5 mg by mouth daily.      Home: Home Living Family/patient expects to be discharged to::  Private residence Living Arrangements: Other relatives,Spouse/significant other Available Help at Discharge: Family,Available PRN/intermittently Type of Home: Mobile home Home Access: Ramped entrance Home Layout: One level Bathroom Shower/Tub: Engineer, manufacturing systems: Standard Home Equipment: Shower seat,Bedside commode,Wheelchair - manual,Walker - 2 wheels Additional Comments: Pt reports she lives with spouse, who does not work, and her sister, who is Bipolar rents a room from them  Lives With: Spouse,Other (Comment)  Functional History: Prior Function Level of Independence: Independent Comments: Pt reports she ambulates in her home without AD, and is independent with basic ADL's Functional Status:  Mobility: Bed Mobility Overal bed mobility: Needs Assistance Bed Mobility: Supine to Sit Rolling: Min assist Supine to sit: Mod assist Sit to supine: Mod assist General bed mobility comments: pt assisted into hooklying on the right and bridged to EOB with min assist, mod truncal assist up via L elbow.  Pt mildly impulsive once moving. Transfers Overall transfer level: Needs assistance Transfers: Sit to/from [employer] Sit to Stand: Mod assist Stand pivot transfers: Max assist  General transfer comment: face to face assist and 1 trial use of the RW for standing and face to face assist for transfer toward the weak side x2.      ADL: ADL Overall ADL's : Needs assistance/impaired Eating/Feeding: Set up,Bed level Grooming: Wash/dry hands,Wash/dry face,Oral care,Brushing hair,Moderate assistance,Bed level Upper Body Bathing: Maximal assistance,Bed level Lower Body Bathing: Maximal assistance,Bed level Upper Body Dressing : Maximal assistance,Bed level Lower Body Dressing: Total assistance,Bed level Toilet Transfer: Total assistance Toilet Transfer Details (indicate cue type and reason): unable to progress to [place] and Hygiene:  Total assistance,Bed level Functional mobility during ADLs: Maximal assistance  Cognition: Cognition Overall Cognitive Status: Difficult to assess Arousal/Alertness: Awake/alert Orientation Level: Oriented X4 Attention: Sustained Sustained Attention: Appears intact Memory: Appears intact (functional for 4 word recall) Awareness: Impaired Awareness Impairment: Anticipatory impairment Problem Solving: Impaired Problem Solving Impairment: Verbal complex Executive Function:  (planning, execution suspect) Behaviors: Other (comment),Lability (slightly) Safety/Judgment: Impaired Cognition Arousal/Alertness: Awake/alert Behavior During Therapy: WFL for tasks assessed/performed,Impulsive,Anxious Overall Cognitive Status: Difficult to assess General Comments: pt's interactions varying within session from intelligible speech to dysarthric speech to gibberish with delay in responses to questions. Difficult to assess due to: Impaired communication  Blood pressure 127/85, pulse 86, temperature 97.9 F (36.6 C), temperature source Axillary, resp. rate 11, SpO2 100 %, unknown if currently breastfeeding. Physical Exam Vitals and nursing note reviewed.  Constitutional:      Comments: Sitting up in bedside chair, perseverates on getting ahold of husband- called 3x- didn't answer- spit out food, moderate to severe dysarthria, NAD  HENT:     Head: Normocephalic and atraumatic.     Comments: Very dysarthric- hard to understand Refused to smile while eating, and wouldn't stop eating Tongue midline    Right Ear: External ear normal.     Left Ear: External ear normal.     Nose: Nose normal. No congestion or rhinorrhea.     Mouth/Throat:     Mouth: Mucous membranes are dry.     Pharynx: Oropharynx is clear. No oropharyngeal exudate.  Eyes:     General:        Right eye: No discharge.        Left eye: No discharge.     Extraocular Movements: Extraocular movements intact.  Cardiovascular:      Comments: RRR- no JVD Pulmonary:     Comments: CTA B/L- no W/R/R- good air movement Abdominal:     Comments: Soft, NT, ND, (+)BS -hyperactive BS since eating  Musculoskeletal:     Cervical back: Normal range of motion. No rigidity.     Comments: RUE- biceps 4-/5, triceps 2/5, WE 2/5, grip 3/5, finger abd 3-/5 LUE 5/5 in same muscles tested RLE- HF and KE 2-/5, DF and PF 2/5  LLE- 5/5 in same muscles tested  Skin:    General: Skin is warm and dry.  Neurological:     Comments: Patient is awake alert no acute distress.  Oriented x3 and follows commands.  Makes full eye contact with examiner.  Pt perseverating on getting ahold of husband- on phone- called 3x- didn't answer- said "want to go to [facility] for therapy"- explained they don't have CIR.      Results for orders placed or performed during the hospital encounter of 07/04/20 (from the past 24 hour(s))  Glucose, capillary     Status: Abnormal   Collection Time: 07/04/20  4:58 PM  Result Value Ref Range   Glucose-Capillary 104 (H) 70 - 99  mg/dL  Glucose, capillary     Status: Abnormal   Collection Time: 07/04/20 10:32 PM  Result Value Ref Range   Glucose-Capillary 239 (H) 70 - 99 mg/dL  Glucose, capillary     Status: Abnormal   Collection Time: 07/05/20  7:19 AM  Result Value Ref Range   Glucose-Capillary 168 (H) 70 - 99 mg/dL  Glucose, capillary     Status: Abnormal   Collection Time: 07/05/20 11:29 AM  Result Value Ref Range   Glucose-Capillary 189 (H) 70 - 99 mg/dL   CT Angio Head W or Wo Contrast  Result Date: 07/03/2020 CLINICAL DATA:  Initial evaluation for possible stroke, neuro deficit, right-sided weakness, slurred speech. EXAM: CT ANGIOGRAPHY HEAD AND NECK TECHNIQUE: Multidetector CT imaging of the head and neck was performed using the standard protocol during bolus administration of intravenous contrast. Multiplanar CT image reconstructions and MIPs were obtained to evaluate the vascular anatomy. Carotid stenosis  measurements (when applicable) are obtained utilizing NASCET criteria, using the distal internal carotid diameter as the denominator. CONTRAST:  34mL OMNIPAQUE IOHEXOL 350 MG/ML SOLN COMPARISON:  Prior CT from earlier the same day. FINDINGS: CTA NECK FINDINGS Aortic arch: Visualized aortic arch of normal caliber with normal branch pattern. No hemodynamically significant stenosis seen about the origin of the great vessels. Right carotid system: Right common carotid artery widely patent from its origin to the bifurcation. Minimal centric calcified plaque at the right bifurcation without stenosis. Right ICA widely patent distally without stenosis, dissection or occlusion. Left carotid system: Left common and internal carotid arteries widely patent without stenosis, dissection or occlusion. No significant atheromatous narrowing about the left bifurcation. Vertebral arteries: Both vertebral arteries arise from subclavian arteries. No proximal subclavian artery stenosis. Vertebral arteries widely patent without stenosis, dissection or occlusion. Skeleton: No acute osseous abnormality. No discrete or worrisome osseous lesions. Poor dentition noted. Other neck: No other acute soft tissue abnormality within the neck. No mass or adenopathy. Upper chest: Visualized upper chest demonstrates no acute finding. Scattered atelectatic changes noted within the visualized lungs. Review of the MIP images confirms the above findings CTA HEAD FINDINGS Anterior circulation: Petrous segments widely patent. Mild atheromatous change within the carotid siphons without hemodynamically significant stenosis. A1 segments widely patent. Normal anterior communicating artery complex. Anterior cerebral arteries patent to their distal aspects without proximal high-grade stenosis. No M1 stenosis or occlusion. Normal MCA bifurcations. No proximal MCA branch occlusion. MCA branches well perfused and symmetric bilaterally. Mild to moderate small vessel  atheromatous irregularity, advanced for age. Posterior circulation: Both V4 segments widely patent to the vertebrobasilar junction. Both PICA origins patent and normal. Basilar widely patent to its distal aspect without stenosis. Superior cerebral arteries patent bilaterally. Both PCAs primarily supplied via the basilar. Multifocal moderate to severe stenoses noted throughout the left P1 and P2 segments. Short-segment severe right P1 stenosis noted (series 606, image 94). Both PCAs remain perfused to their distal aspects. Venous sinuses: Patent. Anatomic variants: None significant.  No intracranial aneurysm. Review of the MIP images confirms the above findings IMPRESSION: 1. Negative CTA for emergent large vessel occlusion. 2. 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 3. Distal small vessel atheromatous irregularity about the intracranial circulation, advanced for age. 4. Wide patency of the major arterial vasculature of the neck. Electronically Signed   By: Jeannine Boga M.D.   On: 07/03/2020 19:19   MR BRAIN WO CONTRAST  Result Date: 07/04/2020 CLINICAL DATA:  Stroke, follow-up. EXAM: MRI  HEAD WITHOUT CONTRAST TECHNIQUE: Multiplanar, multiecho pulse sequences of the brain and surrounding structures were obtained without intravenous contrast. COMPARISON:  Noncontrast head CT 07/03/2020. CT angiogram head/neck 07/03/2020. FINDINGS: Brain: Cerebral volume is normal for age. There is a 2.5 x 1.4 cm acute infarct within the left corona radiata, thalamocapsular junction and lentiform nucleus. Minimal multifocal T2/FLAIR hyperintensity within the cerebral white matter elsewhere and pons, nonspecific but most often secondary to chronic small vessel ischemia. No evidence of intracranial mass. No chronic intracranial blood products. No extra-axial fluid collection. No midline shift. Vascular: Expected proximal arterial flow voids. Skull and upper cervical spine: No focal  marrow lesion. Sinuses/Orbits: Visualized orbits show no acute finding. Trace ethmoid sinus mucosal thickening. Mild mucosal thickening within the right maxillary sinus. Other: Trace fluid within the bilateral mastoid air cells. IMPRESSION: 2.5 x 1.4 cm acute infarct within the left corona radiata, thalamocapsular junction and lentiform nucleus. No mass effect or evidence of hemorrhagic conversion. Background mild chronic small vessel ischemic disease. Electronically Signed   By: Kellie Simmering DO   On: 07/04/2020 16:34   DG Shoulder Right Port  Result Date: 07/04/2020 CLINICAL DATA:  Right shoulder pain.  No known injury. EXAM: PORTABLE RIGHT SHOULDER COMPARISON:  None. FINDINGS: There is no evidence of fracture or dislocation. There is no evidence of arthropathy or other focal bone abnormality. Soft tissues are unremarkable. IMPRESSION: Negative exam Electronically Signed   By: Inge Rise M.D.   On: 07/04/2020 15:39   ECHOCARDIOGRAM COMPLETE  Result Date: 07/04/2020    ECHOCARDIOGRAM REPORT   Patient Name:   Kristy Mcmillan Date of Exam: 07/04/2020 Medical Rec #:  [id number]       Height:       64.0 in Accession #:    [id number]      Weight:       146.8 lb Date of Birth:  1976-02-21       BSA:          1.716 m Patient Age:    53 years        BP:           130/85 mmHg Patient Gender: F               HR:           98 bpm. Exam Location:  Inpatient Procedure: 2D Echo Indications:    Stroke 434.91 / I163.9  History:        Patient has no prior history of Echocardiogram examinations.                 Stroke; Risk Factors:Current Smoker and Diabetes. GERD.  Sonographer:    Leavy Cella Referring Phys: [phone] [facility] [place]  1. Left ventricular ejection fraction, by estimation, is 60 to 65%. The left ventricle has normal function. 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 There is mild concentric left ventricular hypertrophy and moderate basal septal hypertrophy.  2.  Right ventricular systolic function was not well visualized. The right ventricular size is normal.  3. The mitral valve is normal in structure. No evidence of mitral valve regurgitation. No evidence of mitral stenosis. Moderate mitral annular calcification.  4. The aortic valve was not well visualized. Aortic valve regurgitation is not visualized. No aortic stenosis is present.  5. The inferior vena cava is normal in size with greater than 50% respiratory variability, suggesting right atrial pressure of 3 mmHg. FINDINGS  Left Ventricle: Left ventricular ejection  fraction, by estimation, is 55 to 60%. The left ventricle has normal function. The left ventricle demonstrates regional wall motion abnormalities. The left ventricular internal cavity size was normal in size. There is mild concentric left ventricular hypertrophy. Left ventricular diastolic parameters are indeterminate. Elevated left ventricular end-diastolic pressure. Right Ventricle: The right ventricular size is normal. No increase in right ventricular wall thickness. Right ventricular systolic function was not well visualized. Left Atrium: Left atrial size was normal in size. Right Atrium: Right atrial size was normal in size. Pericardium: There is no evidence of pericardial effusion. Mitral Valve: The mitral valve is normal in structure. Moderate mitral annular calcification. No evidence of mitral valve regurgitation. No evidence of mitral valve stenosis. Tricuspid Valve: The tricuspid valve is normal in structure. Tricuspid valve regurgitation is not demonstrated. No evidence of tricuspid stenosis. Aortic Valve: The aortic valve was not well visualized. Aortic valve regurgitation is not visualized. No aortic stenosis is present. Pulmonic Valve: The pulmonic valve was normal in structure. Pulmonic valve regurgitation is not visualized. No evidence of pulmonic stenosis. Aorta: The aortic root is normal in size and structure. Venous: The inferior vena cava  is normal in size with greater than 50% respiratory variability, suggesting right atrial pressure of 3 mmHg. IAS/Shunts: No atrial level shunt detected by color flow Doppler.  LEFT VENTRICLE PLAX 2D LVIDd:         3.60 cm  Diastology LVIDs:         2.90 cm  LV e' medial:    4.57 cm/s LV PW:         1.60 cm  LV E/e' medial:  19.7 LV IVS:        1.40 cm  LV e' lateral:   7.29 cm/s LVOT diam:     1.90 cm  LV E/e' lateral: 12.3 LVOT Area:     2.84 cm  RIGHT VENTRICLE RV S prime:     10.80 cm/s TAPSE (M-mode): 1.4 cm LEFT ATRIUM             Index       RIGHT ATRIUM          Index LA diam:        3.40 cm 1.98 cm/m  RA Area:     8.84 cm LA Vol (A2C):   24.5 ml 14.28 ml/m RA Volume:   18.40 ml 10.73 ml/m LA Vol (A4C):   24.1 ml 14.05 ml/m LA Biplane Vol: 25.0 ml 14.57 ml/m   AORTA Ao Root diam: 2.20 cm MITRAL VALVE MV Area (PHT): 3.23 cm    SHUNTS MV Decel Time: 235 msec    Systemic Diam: 1.90 cm MV E velocity: 90.00 cm/s MV A velocity: 88.70 cm/s MV E/A ratio:  1.01 Fransico Him MD Electronically signed by Fransico Him MD Signature Date/Time: 07/04/2020/10:45:15 AM    Final    CT HEAD CODE STROKE WO CONTRAST  Result Date: 07/03/2020 CLINICAL DATA:  Code stroke.  Difficulty moving right arm EXAM: CT HEAD WITHOUT CONTRAST TECHNIQUE: Contiguous axial images were obtained from the base of the skull through the vertex without intravenous contrast. COMPARISON:  March 12, 2020 FINDINGS: Brain: There is no acute intracranial hemorrhage, mass effect, or edema. Gray-white differentiation is preserved. There is a small chronic infarct of the medial left thalamus. Ventricles and sulci are normal in size and configuration. There is no extra-axial collection. Vascular: No hyperdense vessel. Mild intracranial atherosclerotic calcification is present at the skull base. Skull: Unremarkable. Sinuses/Orbits: Mild deformity  of the medial right orbital wall may reflect prior trauma. Other: Minimal mastoid opacification. ASPECTS  ([place] Stroke Program Early CT Score) - Ganglionic level infarction (caudate, lentiform nuclei, internal capsule, insula, M1-M3 cortex): 7 - Supraganglionic infarction (M4-M6 cortex): 3 Total score (0-10 with 10 being normal): 10 IMPRESSION: There is no acute intracranial hemorrhage or evidence of acute infarction. ASPECT score is 10. Small chronic left thalamic infarct. These results were called by telephone at the time of interpretation on 07/03/2020 at 6:15 pm to provider PHILLIP STAFFORD , who verbally acknowledged these results. Electronically Signed   By: Macy Mis M.D.   On: 07/03/2020 18:17   CT ANGIO NECK CODE STROKE  Result Date: 07/03/2020 CLINICAL DATA:  Initial evaluation for possible stroke, neuro deficit, right-sided weakness, slurred speech. EXAM: CT ANGIOGRAPHY HEAD AND NECK TECHNIQUE: Multidetector CT imaging of the head and neck was performed using the standard protocol during bolus administration of intravenous contrast. Multiplanar CT image reconstructions and MIPs were obtained to evaluate the vascular anatomy. Carotid stenosis measurements (when applicable) are obtained utilizing NASCET criteria, using the distal internal carotid diameter as the denominator. CONTRAST:  67mL OMNIPAQUE IOHEXOL 350 MG/ML SOLN COMPARISON:  Prior CT from earlier the same day. FINDINGS: CTA NECK FINDINGS Aortic arch: Visualized aortic arch of normal caliber with normal branch pattern. No hemodynamically significant stenosis seen about the origin of the great vessels. Right carotid system: Right common carotid artery widely patent from its origin to the bifurcation. Minimal centric calcified plaque at the right bifurcation without stenosis. Right ICA widely patent distally without stenosis, dissection or occlusion. Left carotid system: Left common and internal carotid arteries widely patent without stenosis, dissection or occlusion. No significant atheromatous narrowing about the left bifurcation. Vertebral  arteries: Both vertebral arteries arise from subclavian arteries. No proximal subclavian artery stenosis. Vertebral arteries widely patent without stenosis, dissection or occlusion. Skeleton: No acute osseous abnormality. No discrete or worrisome osseous lesions. Poor dentition noted. Other neck: No other acute soft tissue abnormality within the neck. No mass or adenopathy. Upper chest: Visualized upper chest demonstrates no acute finding. Scattered atelectatic changes noted within the visualized lungs. Review of the MIP images confirms the above findings CTA HEAD FINDINGS Anterior circulation: Petrous segments widely patent. Mild atheromatous change within the carotid siphons without hemodynamically significant stenosis. A1 segments widely patent. Normal anterior communicating artery complex. Anterior cerebral arteries patent to their distal aspects without proximal high-grade stenosis. No M1 stenosis or occlusion. Normal MCA bifurcations. No proximal MCA branch occlusion. MCA branches well perfused and symmetric bilaterally. Mild to moderate small vessel atheromatous irregularity, advanced for age. Posterior circulation: Both V4 segments widely patent to the vertebrobasilar junction. Both PICA origins patent and normal. Basilar widely patent to its distal aspect without stenosis. Superior cerebral arteries patent bilaterally. Both PCAs primarily supplied via the basilar. Multifocal moderate to severe stenoses noted throughout the left P1 and P2 segments. Short-segment severe right P1 stenosis noted (series 606, image 94). Both PCAs remain perfused to their distal aspects. Venous sinuses: Patent. Anatomic variants: None significant.  No intracranial aneurysm. Review of the MIP images confirms the above findings IMPRESSION: 1. Negative CTA for emergent large vessel occlusion. 2. 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 3. Distal small vessel atheromatous irregularity about  the intracranial circulation, advanced for age. 4. Wide patency of the major arterial vasculature of the neck. Electronically Signed   By: Jeannine Boga M.D.   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 Does the need for close, 24 hr/day medical supervision in concert with the patient's rehab needs make it unreasonable for this patient to be served in a less intensive setting? Yes 2. Co-Morbidities requiring supervision/potential complications: Seizures since childhood, DM PVD s/p R 5th foot ray amputation 8/21; new stroke- SLUMs 19/30 3. Due to bladder management, bowel management, safety, skin/wound care, disease management, medication administration, pain management and patient education, does the patient require 24 hr/day rehab nursing? Yes 4. Does the patient require coordinated care of a physician, rehab nurse, therapy disciplines of PT, OT and SLP for dysarthria, etc to address physical and functional deficits in the context of the above medical diagnosis(es)? Yes Addressing deficits in the following areas: balance, endurance, locomotion, strength, transferring, bathing, dressing, feeding, grooming, toileting, speech and language 5. Can the patient actively participate in an intensive therapy program of at least 3 hrs of therapy per day at least 5 days per week? Yes 6. The potential for patient to make measurable gains while on inpatient rehab is good 7. Anticipated functional outcomes upon discharge from inpatient rehab are supervision and min assist  with PT, supervision and min assist with OT, supervision and min assist with SLP. 8. Estimated rehab length of stay to reach the above functional goals is: 10-14 days 9. Anticipated discharge destination: Home 10. Overall Rehab/Functional Prognosis: good  RECOMMENDATIONS: This patient's condition is appropriate for  continued rehabilitative care in the following setting: CIR Patient has agreed to participate in recommended program. Potentially Note that insurance prior authorization may be required for reimbursement for recommended care.  Comment: 1. Pt says wants to go to [facility]- but they don't have a CIR- only subacute rehab- which is an option 2. Told pt she needs to discuss rehab options with husband and let admissions coordinators know.  3. Will submit for possible CIR admission 4. Might need to have a BM- she's can't remember when last had BM.  5. Thank you for this consult.     Lavon Paganini Angiulli, PA-C 07/05/2020    I have personally performed a face to face diagnostic evaluation of this patient and formulated the key components of the plan.  Additionally, I have personally reviewed laboratory data, imaging studies, as well as relevant notes and concur with the physician assistant's documentation above.

## 2020-07-05 NOTE — Progress Notes (Signed)
STROKE TEAM PROGRESS NOTE   INTERVAL HISTORY Patient is lying in bed comfortably. Right sided weakness is improving. Blood pressure adequately controlled. Vital signs stable. MRI scan shows a large left subcortical basal ganglia thalamic infarct. No hemorrhage. Echocardiogram shows normal ejection fraction. LDL cholesterol is elevated 97 mg percent. Hemoglobin A1c is elevated at 8.1.  Vitals:   07/05/20 0400 07/05/20 0500 07/05/20 0600 07/05/20 0700  BP: 126/69 108/66 124/67 114/65  Pulse: 89 83 79 81  Resp: 20 13 15 17   Temp: (!) 97.5 F (36.4 C)     TempSrc: Axillary     SpO2: 98% 96% 97% 97%   CBC:  Recent Labs  Lab 07/03/20 1803 07/04/20 0251  WBC 8.9 10.7*  NEUTROABS 4.6  --   HGB 11.5* 11.7*  HCT 33.4* 33.1*  MCV 81.5 80.3  PLT 427* 123XX123*   Basic Metabolic Panel:  Recent Labs  Lab 07/03/20 1803  NA 138  K 4.0  CL 106  CO2 26  GLUCOSE 271*  BUN 30*  CREATININE 0.94  CALCIUM 9.0   Lipid Panel:  Recent Labs  Lab 07/04/20 0251  CHOL 190  TRIG 305*  HDL 32*  CHOLHDL 5.9  VLDL 61*  LDLCALC 97   HgbA1c:  Recent Labs  Lab 07/04/20 0251  HGBA1C 8.1*   Urine Drug Screen: No results for input(s): LABOPIA, COCAINSCRNUR, LABBENZ, AMPHETMU, THCU, LABBARB in the last 168 hours.   Alcohol Level No results for input(s): ETH in the last 168 hours.  IMAGING past 24 hours MR BRAIN WO CONTRAST  Result Date: 07/04/2020 CLINICAL DATA:  Stroke, follow-up. EXAM: MRI HEAD WITHOUT CONTRAST TECHNIQUE: Multiplanar, multiecho pulse sequences of the brain and surrounding structures were obtained without intravenous contrast. COMPARISON:  Noncontrast head CT 07/03/2020. CT angiogram head/neck 07/03/2020. FINDINGS: Brain: Cerebral volume is normal for age. There is a 2.5 x 1.4 cm acute infarct within the left corona radiata, thalamocapsular junction and lentiform nucleus. Minimal multifocal T2/FLAIR hyperintensity within the cerebral white matter elsewhere and pons, nonspecific but  most often secondary to chronic small vessel ischemia. No evidence of intracranial mass. No chronic intracranial blood products. No extra-axial fluid collection. No midline shift. Vascular: Expected proximal arterial flow voids. Skull and upper cervical spine: No focal marrow lesion. Sinuses/Orbits: Visualized orbits show no acute finding. Trace ethmoid sinus mucosal thickening. Mild mucosal thickening within the right maxillary sinus. Other: Trace fluid within the bilateral mastoid air cells. IMPRESSION: 2.5 x 1.4 cm acute infarct within the left corona radiata, thalamocapsular junction and lentiform nucleus. No mass effect or evidence of hemorrhagic conversion. Background mild chronic small vessel ischemic disease. Electronically Signed   By: Kellie Simmering DO   On: 07/04/2020 16:34   DG Shoulder Right Port  Result Date: 07/04/2020 CLINICAL DATA:  Right shoulder pain.  No known injury. EXAM: PORTABLE RIGHT SHOULDER COMPARISON:  None. FINDINGS: There is no evidence of fracture or dislocation. There is no evidence of arthropathy or other focal bone abnormality. Soft tissues are unremarkable. IMPRESSION: Negative exam Electronically Signed   By: Inge Rise M.D.   On: 07/04/2020 15:39   ECHOCARDIOGRAM COMPLETE  Result Date: 07/04/2020    ECHOCARDIOGRAM REPORT   Patient Name:   Kristy Mcmillan Date of Exam: 07/04/2020 Medical Rec #:  [id number]       Height:       64.0 in Accession #:    [id number]      Weight:       146.8 lb Date  of Birth:  [birth year]       BSA:          1.716 m Patient Age:    66 years        BP:           130/85 mmHg Patient Gender: F               HR:           98 bpm. Exam Location:  Inpatient Procedure: 2D Echo Indications:    Stroke 434.91 / I163.9  History:        Patient has no prior history of Echocardiogram examinations.                 Stroke; Risk Factors:Current Smoker and Diabetes. GERD.  Sonographer:    Leavy Cella Referring Phys: [phone] MCNEILL P [place]  1.  Left ventricular ejection fraction, by estimation, is 60 to 65%. The left ventricle has normal function. 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 There is mild concentric left ventricular hypertrophy and moderate basal septal hypertrophy.  2. Right ventricular systolic function was not well visualized. The right ventricular size is normal.  3. The mitral valve is normal in structure. No evidence of mitral valve regurgitation. No evidence of mitral stenosis. Moderate mitral annular calcification.  4. The aortic valve was not well visualized. Aortic valve regurgitation is not visualized. No aortic stenosis is present.  5. The inferior vena cava is normal in size with greater than 50% respiratory variability, suggesting right atrial pressure of 3 mmHg. FINDINGS  Left Ventricle: Left ventricular ejection fraction, by estimation, is 55 to 60%. The left ventricle has normal function. The left ventricle demonstrates regional wall motion abnormalities. The left ventricular internal cavity size was normal in size. There is mild concentric left ventricular hypertrophy. Left ventricular diastolic parameters are indeterminate. Elevated left ventricular end-diastolic pressure. Right Ventricle: The right ventricular size is normal. No increase in right ventricular wall thickness. Right ventricular systolic function was not well visualized. Left Atrium: Left atrial size was normal in size. Right Atrium: Right atrial size was normal in size. Pericardium: There is no evidence of pericardial effusion. Mitral Valve: The mitral valve is normal in structure. Moderate mitral annular calcification. No evidence of mitral valve regurgitation. No evidence of mitral valve stenosis. Tricuspid Valve: The tricuspid valve is normal in structure. Tricuspid valve regurgitation is not demonstrated. No evidence of tricuspid stenosis. Aortic Valve: The aortic valve was not well visualized. Aortic valve regurgitation is not  visualized. No aortic stenosis is present. Pulmonic Valve: The pulmonic valve was normal in structure. Pulmonic valve regurgitation is not visualized. No evidence of pulmonic stenosis. Aorta: The aortic root is normal in size and structure. Venous: The inferior vena cava is normal in size with greater than 50% respiratory variability, suggesting right atrial pressure of 3 mmHg. IAS/Shunts: No atrial level shunt detected by color flow Doppler.  LEFT VENTRICLE PLAX 2D LVIDd:         3.60 cm  Diastology LVIDs:         2.90 cm  LV e' medial:    4.57 cm/s LV PW:         1.60 cm  LV E/e' medial:  19.7 LV IVS:        1.40 cm  LV e' lateral:   7.29 cm/s LVOT diam:     1.90 cm  LV E/e' lateral: 12.3 LVOT Area:     2.84  cm  RIGHT VENTRICLE RV S prime:     10.80 cm/s TAPSE (M-mode): 1.4 cm LEFT ATRIUM             Index       RIGHT ATRIUM          Index LA diam:        3.40 cm 1.98 cm/m  RA Area:     8.84 cm LA Vol (A2C):   24.5 ml 14.28 ml/m RA Volume:   18.40 ml 10.73 ml/m LA Vol (A4C):   24.1 ml 14.05 ml/m LA Biplane Vol: 25.0 ml 14.57 ml/m   AORTA Ao Root diam: 2.20 cm MITRAL VALVE MV Area (PHT): 3.23 cm    SHUNTS MV Decel Time: 235 msec    Systemic Diam: 1.90 cm MV E velocity: 90.00 cm/s MV A velocity: 88.70 cm/s MV E/A ratio:  1.01 Armanda Magic MD Electronically signed by Armanda Magic MD Signature Date/Time: 07/04/2020/10:45:15 AM    Final    PHYSICAL EXAM   Pleasant middle-age Caucasian lady lying comfortably in bed. Not in distress. .Afebrile. Head is nontraumatic. Neck is supple without bruit. Cardiac exam no murmur or gallop. Lungs are clear to auscultation. Distal pulses are well felt. Neurological Exam : Alert and oriented x3 no aphasia with only minimal dysarthria. Extraocular movements are full range without nystagmus. Blinks to threat bilaterally. Right mild lower facial weakness. Tongue midline. Motor system exam shows right hemiparesis with RUE 3/5 distally, proximal weakness vs. Pain limited exam  due to chronic right shoulder pain RLE  2/5 normal strength on the left side. Sensation is intact. Coordination impaired on the right. Gait not tested.  ASSESSMENT/PLAN [name] is a 45 y.o. 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 Also with PAD for which angio was performed on 03/13/20 and underwent PTA of the right tibioperoneal trunk and placed on plavix ASA during same admission. She was noncompliant vs. misinformed and did not follow through with outpatient postoperative antibiotic regimen.  She presented yesterday with isolated right-sided weakness (RUE and RLE) consistent with a subcortical stroke. She stopped taking her Plavix and ASA one week ago because she thought it was causing right shoulder pain. She received TPA at 1906 on 1/4.   Stroke: Acute infarcts within the left corona radiata, thalamocapsular junction and lentiform nucleus  likely secondary to embolism from cryptogenic source  UDS re-ordered   Transfer to floor today   Code Stroke: CT head no acute abnormality.  Small chronic left thalamic infarct.   CTA head/neck: Negative for large vessel occlusion. 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 Distal small vessel atheromatous irregularity about thintracranial circulation, advanced for age. Wide patency of the major arterial vasculatur of the neck.  MRI: 2.5 x 1.4 cm acute infarct within the left corona radiata,thalamocapsular junction and lentiform nucleus. No mass effect orevidence of hemorrhagic conversion.Background mild chronic small vessel ischemic disease.  2D Echo: EF 60-65%, needs TEE  LDL 97  HgbA1c 8.1  VTE prophylaxis     Diet   Diet Carb Modified Fluid consistency: Thin; Room service appropriate? Yes with Assist    On plavix  and ASA prior to admission although not taking x one week, s/p TPA 1/4. Plan for plavix and ASA daily.   Therapy recommendations: Inpatient rehabilitation  Disposition:  Inpatient Rehabiliation  Hyperlipidemia  Home meds: atorvastatin 40mg  daily resumed in hospital  LDL 97, goal < 70  Continue statin at discharge  Diabetes type II Uncontrolled  Home meds: Levemir 10u at HS  HgbA1c 8.1, goal < 7.0  CBGs Recent Labs    07/04/20 1658 07/04/20 2232 07/05/20 0719  GLUCAP 104* 239* 168*      SSI  Other Stroke Risk Factors  Current Cigarette smoker: advised to stopsmoking  Other Active Problems:  Right shoulder pain No acute findings on shoulder Xray May use RUE sling for comfort   Seizure Disorder: Continue zonisamide   Hospital day # 1 Recommend mobilize out of bed. Ongoing therapies. Transfer to neurology floor diet. Check TEE and loop recorder for paroxysmal A. fib. Aggressive risk factor modification. Aspirin for stroke prevention. Greater than 50% time during this 35-minute visit were spent on counseling and coordination of care about cryptogenic stroke and hemiplegia and answering questions Antony Contras, MD   To contact Stroke Continuity provider, please refer to [email or web address] After hours, contact General Neurology

## 2020-07-05 NOTE — Progress Notes (Signed)
Physical Therapy Treatment Patient Details Name: Kristy Mcmillan MRN: [id number] DOB: 1976-04-25 Today's Date: 07/05/2020    History of Present Illness Kristy Mcmillan is a 45 y.o. admitted severe right-sided weakness, taken to [facility], TPA was administered. CT There is no acute intracranial hemorrhage or evidence of acute infarction. Small chronic left thalamic infarct. PMH: seizures, DM    PT Comments    Pt continues to show variability and difficulty of expression.  She show improvement toward goals.  Emphasis on warm up LE exercise, transition to EOB, sitting balance/working toward upright mildline orientation without UE's, sit to stand at EOB, with RW and face to face assist, progressing to [facility] and then onto the recliner with pivot transfer and face to face assist.    Follow Up Recommendations  CIR;Supervision/Assistance - 24 hour     Equipment Recommendations  None recommended by PT;Other (comment) (TBD next venue)    Recommendations for Other Services Rehab consult     Precautions / Restrictions Precautions Precautions: Fall    Mobility  Bed Mobility Overal bed mobility: Needs Assistance Bed Mobility: Supine to Sit     Supine to sit: Mod assist     General bed mobility comments: pt assisted into hooklying on the right and bridged to EOB with min assist, mod truncal assist up via L elbow.  Pt mildly impulsive once moving.  Transfers Overall transfer level: Needs assistance   Transfers: Sit to/from Stand;Stand Pivot Transfers Sit to Stand: Mod assist Stand pivot transfers: Max assist       General transfer comment: face to face assist and 1 trial use of the RW for standing and face to face assist for transfer toward the weak side x2.  Ambulation/Gait                 Stairs             Wheelchair Mobility    Modified Rankin (Stroke Patients Only) Modified Rankin (Stroke Patients Only) Modified Rankin: Severe disability      Balance Overall balance assessment: Needs assistance Sitting-balance support: Feet supported Sitting balance-Leahy Scale: Poor Sitting balance - Comments: pt reliant on UE or external assist   Standing balance support: Bilateral upper extremity supported Standing balance-Leahy Scale: Poor Standing balance comment: moderate external support.                            Cognition Arousal/Alertness: Awake/alert Behavior During Therapy: WFL for tasks assessed/performed;Impulsive;Anxious Overall Cognitive Status: Difficult to assess                                        Exercises Other Exercises Other Exercises: aa/graded resistance R LE hip/knee flex/ext, AROM/graded resistance to L LE.    General Comments General comments (skin integrity, edema, etc.): vss      Pertinent Vitals/Pain Pain Assessment: Faces Faces Pain Scale: Hurts a little bit Pain Intervention(s): Monitored during session    Home Living                      Prior Function            PT Goals (current goals can now be found in the care plan section) Acute Rehab PT Goals Patient Stated Goal: "I want to go home" PT Goal Formulation: With patient Time For Goal Achievement: 07/18/20  Potential to Achieve Goals: Good Progress towards PT goals: Progressing toward goals    Frequency    Min 4X/week      PT Plan Current plan remains appropriate    Co-evaluation              AM-PAC PT "6 Clicks" Mobility   Outcome Measure  Help needed turning from your back to your side while in a flat bed without using bedrails?: A Lot Help needed moving from lying on your back to sitting on the side of a flat bed without using bedrails?: A Lot Help needed moving to and from a bed to a chair (including a wheelchair)?: A Lot Help needed standing up from a chair using your arms (e.g., wheelchair or bedside chair)?: A Lot Help needed to walk in hospital room?: Total Help needed  climbing 3-5 steps with a railing? : Total 6 Click Score: 10    End of Session   Activity Tolerance: Patient tolerated treatment well Patient left: in chair;with call bell/phone within reach;with chair alarm set (on lift padding) Nurse Communication: Mobility status PT Visit Diagnosis: Other abnormalities of gait and mobility (R26.89);Hemiplegia and hemiparesis Hemiplegia - Right/Left: Right Hemiplegia - dominant/non-dominant: Dominant Hemiplegia - caused by: Other cerebrovascular disease Pain - Right/Left: Right Pain - part of body: Shoulder     Time: 1030-1059 PT Time Calculation (min) (ACUTE ONLY): 29 min  Charges:  $Therapeutic Activity: 8-22 mins $Neuromuscular Re-education: 8-22 mins                     07/05/2020  Ginger Carne., PT Acute Rehabilitation Services [phone]  (pager) [phone]  (office)   [name] 07/05/2020, 2:04 PM

## 2020-07-05 NOTE — Progress Notes (Signed)
Inpatient Rehab Admissions Coordinator Note:   Per therapy recommendations, pt was screened for CIR candidacy by Estill Dooms, PT, DPT.  At this time note MRI demonstrates acute CVA.  Recommending CIR consult and I will place an order per our protocol.  Please contact me with questions.   Estill Dooms, PT, DPT [phone] 07/05/20 11:36 AM

## 2020-07-05 NOTE — Progress Notes (Signed)
Kristy Mcmillan just arrived to the unit A/Ox4 , oriented to the unit and room equipment. Bed is low, call bell in reach, will continue to monitor.

## 2020-07-06 DIAGNOSIS — I6300 Cerebral infarction due to thrombosis of unspecified precerebral artery: Secondary | ICD-10-CM | POA: Diagnosis not present

## 2020-07-06 LAB — GLUCOSE, CAPILLARY
Glucose-Capillary: 136 mg/dL — ABNORMAL HIGH (ref 70–99)
Glucose-Capillary: 171 mg/dL — ABNORMAL HIGH (ref 70–99)
Glucose-Capillary: 166 mg/dL — ABNORMAL HIGH (ref 70–99)
Glucose-Capillary: 216 mg/dL — ABNORMAL HIGH (ref 70–99)
Glucose-Capillary: 146 mg/dL — ABNORMAL HIGH (ref 70–99)

## 2020-07-06 NOTE — H&P (Incomplete)
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  Per chart review lives with spouse as well as her sister.  Mobile home with ramped entrance.  Ambulates in her home without assistive device independent with basic ADLs.  Presented 07/04/2020 with right side weakness to [facility] hospital.  Patient had recently stopped taking her aspirin and Plavix 1 week prior.  Cranial CT scan showed no acute changes.  There is a small chronic left thalamic infarction.  CT angiogram of the head and neck with no emergent large vessel occlusion.  Patient did receive TPA.  MRI showed a 2.5 x 1.4 cm acute infarct in the left corona radiata no mass-effect.  Echocardiogram with ejection fraction of 60 to 65%.  Admission chemistries hemoglobin A1c 8.1 triglyceride 305.  Neurology follow-up per aspirin 81 mg daily and Plavix 75 mg daily have been resumed..  Awaiting plan for TEE and loop recorder placement.  Tolerating a regular diet.  Due to patient's right side weakness decreased functional ability physical medicine rehab consult requested.  Review of Systems  Constitutional: Negative for chills and fever.  HENT: Negative for hearing loss.   Eyes: Negative for blurred vision and double vision.  Respiratory: Positive for cough.   Cardiovascular: Positive for leg swelling. Negative for chest pain and palpitations.  Gastrointestinal: Positive for constipation. Negative for heartburn, nausea and vomiting.  Genitourinary: Negative for dysuria, flank pain and hematuria.  Musculoskeletal: Positive for joint pain and myalgias.  Skin:  Negative for rash.  Neurological: Positive for sensory change, seizures and weakness.       History of childhood seizures  Psychiatric/Behavioral: The patient has insomnia.   All other systems reviewed and are negative.  Past Medical History:  Diagnosis Date  . Diabetes mellitus without complication ([place])   . Seizures ([place]) 2005 Last sz   childhood febrile sz, then ?eclampsia   Past Surgical History:  Procedure Laterality Date  . AMPUTATION TOE Right 03/10/2020   Procedure: AMPUTATION FIFTH RAY WITH IRRIGATION AND DEBRIDEMENT RIGHT FOOT;  Surgeon: Sharlotte Alamo, DPM;  Location: [facility];  Service: Podiatry;  Laterality: Right;  . CESAREAN SECTION    . CESAREAN SECTION N/A 03/02/2013   Procedure: CESAREAN SECTION;  Surgeon: Florian Buff, MD;  Location: [facility];  Service: Obstetrics;  Laterality: N/A;  Repeat Cesarean Section Delivery Nonviable Baby Girl @ 2158  . IRRIGATION AND DEBRIDEMENT FOOT Right 03/15/2020   Procedure: IRRIGATION AND DEBRIDEMENT FOOT;  Surgeon: Sharlotte Alamo, DPM;  Location: [facility];  Service: Podiatry;  Laterality: Right;  . LOWER EXTREMITY ANGIOGRAPHY Right 03/13/2020   Procedure: Lower Extremity Angiography;  Surgeon: Katha Cabal, MD;  Location: [facility];  Service: Cardiovascular;  Laterality: Right;   Family History  Problem Relation Age of Onset  . Diabetes Mother   . Diabetes Father    Social History:  reports that she has been smoking. She has never used smokeless tobacco. She reports that she does not drink alcohol and does not use drugs. Allergies:  Allergies  Allergen Reactions  . Dilantin [Phenytoin Sodium Extended] Other (See Comments)    Causes seizures  .  Phenobarbital Other (See Comments)    Causes seizures Other reaction(s): Unknown Causes seizures  . Phenytoin Sodium Extended     Other reaction(s): Other (See Comments) Causes seizures  . Aspartame Diarrhea and Nausea And Vomiting  . Aspartame And Phenylalanine Diarrhea and  Nausea And Vomiting  . Carbamazepine Other (See Comments)    Other reaction(s): Unknown  . Cymbalta [Duloxetine Hcl] Diarrhea  . Gabapentin Other (See Comments)    Other reaction(s): Unknown Feels like she is going to have a seizure  . Ciprofloxacin Rash    blisters  . Humalog [Insulin Lispro] Rash  . Lantus [Insulin Glargine] Rash  . Novolin 70-30 [Insulin Nph Isophane & Regular] Itching and Rash  . Penicillins Itching and Rash   Medications Prior to Admission  Medication Sig Dispense Refill  . albuterol (VENTOLIN HFA) 108 (90 Base) MCG/ACT inhaler Inhale 1 puff into the lungs every 6 (six) hours as needed for wheezing or shortness of breath.    Marland Kitchen atorvastatin (LIPITOR) 40 MG tablet Take 40 mg by mouth at bedtime.    . DULoxetine (CYMBALTA) 30 MG capsule Take 30 mg by mouth 2 (two) times daily.    . fluticasone (FLONASE) 50 MCG/ACT nasal spray Place 2 sprays into both nostrils daily.    Marland Kitchen ibuprofen (ADVIL) 200 MG tablet Take 400 mg by mouth every 6 (six) hours as needed for headache or mild pain.    Marland Kitchen linagliptin (TRADJENTA) 5 MG TABS tablet Take 5 mg by mouth daily.    . Multiple Vitamins-Minerals (MULTIVITAMIN WITH MINERALS) tablet Take 1 tablet by mouth daily.    . nortriptyline (PAMELOR) 10 MG capsule Take 40 mg by mouth at bedtime.    Marland Kitchen omeprazole (PRILOSEC) 20 MG capsule Take 20 mg by mouth daily.    Marland Kitchen zonisamide (ZONEGRAN) 100 MG capsule Take 100 mg by mouth daily.    Marland Kitchen aspirin EC 81 MG EC tablet Take 1 tablet (81 mg total) by mouth daily. Swallow whole. (Patient not taking: Reported on 07/05/2020) 30 tablet 1  . clopidogrel (PLAVIX) 75 MG tablet Take 1 tablet (75 mg total) by mouth daily with breakfast. (Patient not taking: No sig reported) 30 tablet 1  . ferrous sulfate 325 (65 FE) MG tablet Take 1 tablet (325 mg total) by mouth daily with breakfast. (Patient not taking: No sig reported) 30 tablet 1  . lisinopril (ZESTRIL) 2.5 MG tablet Take 2.5 mg by mouth daily.      Drug  Regimen Review Drug regimen was reviewed and remains appropriate with no significant issues identified  Home: Home Living Family/patient expects to be discharged to:: Private residence Living Arrangements: Other relatives,Spouse/significant other Available Help at Discharge: Family,Available PRN/intermittently Type of Home: Mobile home Home Access: Ramped entrance Home Layout: One level Bathroom Shower/Tub: Engineer, manufacturing systems: Standard Home Equipment: Shower seat,Bedside commode,Wheelchair - manual,Walker - 2 wheels Additional Comments: Pt reports she lives with spouse, who does not work, and her sister, who is Bipolar rents a room from them  Lives With: Spouse,Other (Comment)   Functional History: Prior Function Level of Independence: Independent Comments: Pt reports she ambulates in her home without AD, and is independent with basic ADL's  Functional Status:  Mobility: Bed Mobility Overal bed mobility: Needs Assistance Bed Mobility: Supine to Sit Rolling: Min assist Supine to sit: Mod assist Sit to supine: Mod assist General bed mobility comments: pt assisted into hooklying on the right and bridged to EOB with min assist, mod truncal assist up via L  elbow.  Pt mildly impulsive once moving. Transfers Overall transfer level: Needs assistance Transfers: Sit to/from [employer] Sit to Stand: Mod assist Stand pivot transfers: Max assist General transfer comment: face to face assist and 1 trial use of the RW for standing and face to face assist for transfer toward the weak side x2.      ADL: ADL Overall ADL's : Needs assistance/impaired Eating/Feeding: Set up,Bed level Grooming: Wash/dry hands,Wash/dry face,Oral care,Brushing hair,Moderate assistance,Bed level Upper Body Bathing: Maximal assistance,Bed level Lower Body Bathing: Maximal assistance,Bed level Upper Body Dressing : Maximal assistance,Bed level Lower Body Dressing: Total  assistance,Bed level Toilet Transfer: Total assistance Toilet Transfer Details (indicate cue type and reason): unable to progress to [place] and Hygiene: Total assistance,Bed level Functional mobility during ADLs: Maximal assistance  Cognition: Cognition Overall Cognitive Status: Difficult to assess Arousal/Alertness: Awake/alert Orientation Level: Oriented X4 Attention: Sustained Sustained Attention: Appears intact Memory: Appears intact (functional for 4 word recall) Awareness: Impaired Awareness Impairment: Anticipatory impairment Problem Solving: Impaired Problem Solving Impairment: Verbal complex Executive Function:  (planning, execution suspect) Behaviors: Other (comment),Lability (slightly) Safety/Judgment: Impaired Cognition Arousal/Alertness: Awake/alert Behavior During Therapy: WFL for tasks assessed/performed,Impulsive,Anxious Overall Cognitive Status: Difficult to assess General Comments: pt's interactions varying within session from intelligible speech to dysarthric speech to gibberish with delay in responses to questions. Difficult to assess due to: Impaired communication  Physical Exam: Blood pressure 121/71, pulse (!) 102, temperature 98.4 F (36.9 C), temperature source Oral, resp. rate 18, SpO2 99 %, unknown if currently breastfeeding. Physical Exam Neurological:     Comments: Patient is alert sitting up in bed.  Speech is very dysarthric.  Some perseveration.  Provides her name and age.  Follows simple commands.     Results for orders placed or performed during the hospital encounter of 07/04/20 (from the past 48 hour(s))  Glucose, capillary     Status: Abnormal   Collection Time: 07/04/20  7:34 AM  Result Value Ref Range   Glucose-Capillary 192 (H) 70 - 99 mg/dL    Comment: Glucose reference range applies only to samples taken after fasting for at least 8 hours.  Glucose, capillary     Status: Abnormal   Collection Time:  07/04/20 11:23 AM  Result Value Ref Range   Glucose-Capillary 182 (H) 70 - 99 mg/dL    Comment: Glucose reference range applies only to samples taken after fasting for at least 8 hours.  Glucose, capillary     Status: Abnormal   Collection Time: 07/04/20  4:58 PM  Result Value Ref Range   Glucose-Capillary 104 (H) 70 - 99 mg/dL    Comment: Glucose reference range applies only to samples taken after fasting for at least 8 hours.  Glucose, capillary     Status: Abnormal   Collection Time: 07/04/20 10:32 PM  Result Value Ref Range   Glucose-Capillary 239 (H) 70 - 99 mg/dL    Comment: Glucose reference range applies only to samples taken after fasting for at least 8 hours.  Glucose, capillary     Status: Abnormal   Collection Time: 07/05/20  7:19 AM  Result Value Ref Range   Glucose-Capillary 168 (H) 70 - 99 mg/dL    Comment: Glucose reference range applies only to samples taken after fasting for at least 8 hours.  Glucose, capillary     Status: Abnormal   Collection Time: 07/05/20 11:29 AM  Result Value Ref Range   Glucose-Capillary 189 (H) 70 - 99 mg/dL  Comment: Glucose reference range applies only to samples taken after fasting for at least 8 hours.  Rapid urine drug screen (hospital performed)     Status: None   Collection Time: 07/05/20 12:21 PM  Result Value Ref Range   Opiates NONE DETECTED NONE DETECTED   Cocaine NONE DETECTED NONE DETECTED   Benzodiazepines NONE DETECTED NONE DETECTED   Amphetamines NONE DETECTED NONE DETECTED   Tetrahydrocannabinol NONE DETECTED NONE DETECTED   Barbiturates NONE DETECTED NONE DETECTED    Comment: (NOTE) DRUG SCREEN FOR MEDICAL PURPOSES ONLY.  IF CONFIRMATION IS NEEDED FOR ANY PURPOSE, NOTIFY LAB WITHIN 5 DAYS.  LOWEST DETECTABLE LIMITS FOR URINE DRUG SCREEN Drug Class                     Cutoff (ng/mL) Amphetamine and metabolites    1000 Barbiturate and metabolites    200 Benzodiazepine                 [id number] Tricyclics and  metabolites     300 Opiates and metabolites        300 Cocaine and metabolites        300 THC                            50 Performed at [facility], [place] [address], [place], [place] [address]   Glucose, capillary     Status: Abnormal   Collection Time: 07/05/20  4:35 PM  Result Value Ref Range   Glucose-Capillary 135 (H) 70 - 99 mg/dL    Comment: Glucose reference range applies only to samples taken after fasting for at least 8 hours.  Glucose, capillary     Status: Abnormal   Collection Time: 07/05/20  9:25 PM  Result Value Ref Range   Glucose-Capillary 133 (H) 70 - 99 mg/dL    Comment: Glucose reference range applies only to samples taken after fasting for at least 8 hours.  Glucose, capillary     Status: Abnormal   Collection Time: 07/06/20  4:00 AM  Result Value Ref Range   Glucose-Capillary 136 (H) 70 - 99 mg/dL    Comment: Glucose reference range applies only to samples taken after fasting for at least 8 hours.  Glucose, capillary     Status: Abnormal   Collection Time: 07/06/20  6:00 AM  Result Value Ref Range   Glucose-Capillary 171 (H) 70 - 99 mg/dL    Comment: Glucose reference range applies only to samples taken after fasting for at least 8 hours.   MR BRAIN WO CONTRAST  Result Date: 07/04/2020 CLINICAL DATA:  Stroke, follow-up. EXAM: MRI HEAD WITHOUT CONTRAST TECHNIQUE: Multiplanar, multiecho pulse sequences of the brain and surrounding structures were obtained without intravenous contrast. COMPARISON:  Noncontrast head CT 07/03/2020. CT angiogram head/neck 07/03/2020. FINDINGS: Brain: Cerebral volume is normal for age. There is a 2.5 x 1.4 cm acute infarct within the left corona radiata, thalamocapsular junction and lentiform nucleus. Minimal multifocal T2/FLAIR hyperintensity within the cerebral white matter elsewhere and pons, nonspecific but most often secondary to chronic small vessel ischemia. No evidence of intracranial mass. No chronic intracranial blood  products. No extra-axial fluid collection. No midline shift. Vascular: Expected proximal arterial flow voids. Skull and upper cervical spine: No focal marrow lesion. Sinuses/Orbits: Visualized orbits show no acute finding. Trace ethmoid sinus mucosal thickening. Mild mucosal thickening within the right maxillary sinus. Other: Trace fluid within the bilateral mastoid  air cells. IMPRESSION: 2.5 x 1.4 cm acute infarct within the left corona radiata, thalamocapsular junction and lentiform nucleus. No mass effect or evidence of hemorrhagic conversion. Background mild chronic small vessel ischemic disease. Electronically Signed   By: Kellie Simmering DO   On: 07/04/2020 16:34   DG Shoulder Right Port  Result Date: 07/04/2020 CLINICAL DATA:  Right shoulder pain.  No known injury. EXAM: PORTABLE RIGHT SHOULDER COMPARISON:  None. FINDINGS: There is no evidence of fracture or dislocation. There is no evidence of arthropathy or other focal bone abnormality. Soft tissues are unremarkable. IMPRESSION: Negative exam Electronically Signed   By: Inge Rise M.D.   On: 07/04/2020 15:39   ECHOCARDIOGRAM COMPLETE  Result Date: 07/04/2020    ECHOCARDIOGRAM REPORT   Patient Name:   LOLETHA TEATE Tillery Date of Exam: 07/04/2020 Medical Rec #:  [id number]       Height:       64.0 in Accession #:    [id number]      Weight:       146.8 lb Date of Birth:  03/13/1976       BSA:          1.716 m Patient Age:    63 years        BP:           130/85 mmHg Patient Gender: F               HR:           98 bpm. Exam Location:  Inpatient Procedure: 2D Echo Indications:    Stroke 434.91 / I163.9  History:        Patient has no prior history of Echocardiogram examinations.                 Stroke; Risk Factors:Current Smoker and Diabetes. GERD.  Sonographer:    Leavy Cella Referring Phys: [phone] MCNEILL P Brinkley  1. Left ventricular ejection fraction, by estimation, is 60 to 65%. The left ventricle has normal function. 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 There is mild concentric left ventricular hypertrophy and moderate basal septal hypertrophy.  2. Right ventricular systolic function was not well visualized. The right ventricular size is normal.  3. The mitral valve is normal in structure. No evidence of mitral valve regurgitation. No evidence of mitral stenosis. Moderate mitral annular calcification.  4. The aortic valve was not well visualized. Aortic valve regurgitation is not visualized. No aortic stenosis is present.  5. The inferior vena cava is normal in size with greater than 50% respiratory variability, suggesting right atrial pressure of 3 mmHg. FINDINGS  Left Ventricle: Left ventricular ejection fraction, by estimation, is 55 to 60%. The left ventricle has normal function. The left ventricle demonstrates regional wall motion abnormalities. The left ventricular internal cavity size was normal in size. There is mild concentric left ventricular hypertrophy. Left ventricular diastolic parameters are indeterminate. Elevated left ventricular end-diastolic pressure. Right Ventricle: The right ventricular size is normal. No increase in right ventricular wall thickness. Right ventricular systolic function was not well visualized. Left Atrium: Left atrial size was normal in size. Right Atrium: Right atrial size was normal in size. Pericardium: There is no evidence of pericardial effusion. Mitral Valve: The mitral valve is normal in structure. Moderate mitral annular calcification. No evidence of mitral valve regurgitation. No evidence of mitral valve stenosis. Tricuspid Valve: The tricuspid valve is normal in structure. Tricuspid valve regurgitation is not demonstrated.  No evidence of tricuspid stenosis. Aortic Valve: The aortic valve was not well visualized. Aortic valve regurgitation is not visualized. No aortic stenosis is present. Pulmonic Valve: The pulmonic valve was normal in structure. Pulmonic  valve regurgitation is not visualized. No evidence of pulmonic stenosis. Aorta: The aortic root is normal in size and structure. Venous: The inferior vena cava is normal in size with greater than 50% respiratory variability, suggesting right atrial pressure of 3 mmHg. IAS/Shunts: No atrial level shunt detected by color flow Doppler.  LEFT VENTRICLE PLAX 2D LVIDd:         3.60 cm  Diastology LVIDs:         2.90 cm  LV e' medial:    4.57 cm/s LV PW:         1.60 cm  LV E/e' medial:  19.7 LV IVS:        1.40 cm  LV e' lateral:   7.29 cm/s LVOT diam:     1.90 cm  LV E/e' lateral: 12.3 LVOT Area:     2.84 cm  RIGHT VENTRICLE RV S prime:     10.80 cm/s TAPSE (M-mode): 1.4 cm LEFT ATRIUM             Index       RIGHT ATRIUM          Index LA diam:        3.40 cm 1.98 cm/m  RA Area:     8.84 cm LA Vol (A2C):   24.5 ml 14.28 ml/m RA Volume:   18.40 ml 10.73 ml/m LA Vol (A4C):   24.1 ml 14.05 ml/m LA Biplane Vol: 25.0 ml 14.57 ml/m   AORTA Ao Root diam: 2.20 cm MITRAL VALVE MV Area (PHT): 3.23 cm    SHUNTS MV Decel Time: 235 msec    Systemic Diam: 1.90 cm MV E velocity: 90.00 cm/s MV A velocity: 88.70 cm/s MV E/A ratio:  1.01 Fransico Him MD Electronically signed by Fransico Him MD Signature Date/Time: 07/04/2020/10:45:15 AM    Final        Medical Problem List and Plan: 1.  Right side weakness secondary to left corona radiata infarction.  Status post TPA  -patient may *** shower  -ELOS/Goals: *** 2.  Antithrombotics: -DVT/anticoagulation:  SCD  -antiplatelet therapy: Aspirin 81 mg daily and Plavix 75 mg daily 3. Pain Management: Cymbalta 30 mg twice daily 4. Mood: Pamelor 40 mg nightly  -antipsychotic agents: N/A 5. Neuropsych: This patient is capable of making decisions on her own behalf. 6. Skin/Wound Care: Routine skin checks  7. Fluids/Electrolytes/Nutrition:  8.  History of seizures.  Continue Zonegram 100 mg daily 9.PVD.  Status post right 5th ray amputation 03/10/2020 as well as angiogram  03/13/2020 underwent PTA of right tibial peroneal trunk and was placed on Plavix and aspirin at that time.  Follow-up vascular surgery 10.  Tobacco abuse.  Counseling 11.  Diabetes mellitus with peripheral neuropathy.  Hemoglobin A1c 8.1.  SSI.  Patient on Tradjenta 5 mg daily prior to admission.  Resume as needed 12.  Permissive hypertension.  Patient on lisinopril 2.5 mg daily prior to admission.  Resume as needed 13.  Hyperlipidemia.  Lipitor ***  Cathlyn Parsons, PA-C 07/06/2020

## 2020-07-06 NOTE — Progress Notes (Signed)
Occupational Therapy Treatment Patient Details Name: Kristy Mcmillan MRN: [id number] DOB: 11-Sep-1975 Today's Date: 07/06/2020    History of present illness Kristy Mcmillan is a 45 y.o. admitted severe right-sided weakness, taken to [facility], TPA was administered. CT There is no acute intracranial hemorrhage or evidence of acute infarction. Small chronic left thalamic infarct. PMH: seizures, DM   OT comments  Pt received seated in recliner wanting to return to bed. Pt continues to present with R hemiplegia, decreased activity tolerance, generalized weakness and cognitive deficits. Pt required MOD A +2 for sit<>stand from recliner, total A +2 to pivot to pts R side back to bed. Pt with difficulty initiating any pivotal steps during functional transfers. Pts husband reports he plans to take her back home, feel pt more appropriate for CIR placement, therefore DC plan remains appropriate, will continue to follow acutely per POC.   Follow Up Recommendations  CIR;Supervision/Assistance - 24 hour    Equipment Recommendations  None recommended by OT    Recommendations for Other Services      Precautions / Restrictions Precautions Precautions: Fall Restrictions Weight Bearing Restrictions: No       Mobility Bed Mobility Overal bed mobility: Needs Assistance Bed Mobility: Sit to Sidelying;Rolling Rolling: Min guard       Sit to sidelying: Max assist;+2 for physical assistance General bed mobility comments: MAX A +2 to lower pt into sidelying from EOB needing assist to elevate BLEs back to bed, min guard to roll onto pts back  Transfers Overall transfer level: Needs assistance Equipment used: 2 person hand held assist Transfers: Sit to/from [facility] Sit to Stand: Mod assist;+2 physical assistance Stand pivot transfers: +2 physical assistance;Total assist       General transfer comment: MOD A +2 to power into standing from recliner, pivot back to bed with  pt having difficulty initating pivotal steps needing total A +2    Balance Overall balance assessment: Needs assistance Sitting-balance support: Single extremity supported;Feet supported Sitting balance-Leahy Scale: Poor Sitting balance - Comments: pt reliant on UE or external assist   Standing balance support: Bilateral upper extremity supported Standing balance-Leahy Scale: Poor Standing balance comment: reliant on external assist and BUE support                           ADL either performed or assessed with clinical judgement   ADL Overall ADL's : Needs assistance/impaired                     Lower Body Dressing: Total assistance;Sitting/lateral leans Lower Body Dressing Details (indicate cue type and reason): to adjust socks from sitting in recliner Toilet Transfer: Maximal assistance;+2 for physical assistance;Stand-pivot Toilet Transfer Details (indicate cue type and reason): MAX A +2 for simulated stand pivot transfer from Recliner>EOB         Functional mobility during ADLs: Maximal assistance;+2 for physical assistance (stand pivot only) General ADL Comments: pt continues to present with R sided weakness, generalized weakness and decreased activity tolerance     Vision Baseline Vision/History: Wears glasses Wears Glasses: At all times Patient Visual Report: No change from baseline Vision Assessment?: Yes Eye Alignment: Within Functional Limits Ocular Range of Motion: Within Functional Limits Alignment/Gaze Preference: Within Defined Limits Tracking/Visual Pursuits: Decreased smoothness of eye movement to RIGHT superior field Saccades: Within functional limits Convergence: Within functional limits Visual Fields: No apparent deficits Depth Perception: Undershoots (difficult to assess if it is  true depth perception or lack of functional ROM) Additional Comments: pt denies decreased ability to track to R superior field but noted some slight issues  during assessment. Continue to assess visoion as pt progresses.    Perception     Praxis      Cognition Arousal/Alertness: Awake/alert Behavior During Therapy: WFL for tasks assessed/performed;Impulsive;Anxious Overall Cognitive Status: Impaired/Different from baseline Area of Impairment: Following commands;Safety/judgement;Awareness;Problem solving;Memory;Attention                   Current Attention Level: Focused   Following Commands: Follows one step commands inconsistently;Follows one step commands with increased time Safety/Judgement: Decreased awareness of safety;Decreased awareness of deficits Awareness: Intellectual Problem Solving: Slow processing;Difficulty sequencing;Decreased initiation;Requires verbal cues;Requires tactile cues General Comments: pt mostly following commands but easily distracted by husband presenting with difficulty attenting to session.        Exercises     Shoulder Instructions       General Comments VSS on RA, pts husband present during session. pts husband reports he is going to take her home and do therapy with her at home    Pertinent Vitals/ Pain       Pain Assessment: Faces Faces Pain Scale: Hurts even more Pain Location: R shoulder Pain Descriptors / Indicators: Sore;Grimacing Pain Intervention(s): Limited activity within patient's tolerance;Monitored during session;Repositioned  Home Living                                          Prior Functioning/Environment              Frequency  Min 2X/week        Progress Toward Goals  OT Goals(current goals can now be found in the care plan section)  Progress towards OT goals: Progressing toward goals  Acute Rehab OT Goals Patient Stated Goal: to improve mobility and reduce pain OT Goal Formulation: With patient Time For Goal Achievement: 07/18/20 Potential to Achieve Goals: Good  Plan Discharge plan remains appropriate;Frequency remains  appropriate    Co-evaluation                 AM-PAC OT "6 Clicks" Daily Activity     Outcome Measure   Help from another person eating meals?: A Little Help from another person taking care of personal grooming?: A Lot Help from another person toileting, which includes using toliet, bedpan, or urinal?: Total Help from another person bathing (including washing, rinsing, drying)?: Total Help from another person to put on and taking off regular upper body clothing?: Total Help from another person to put on and taking off regular lower body clothing?: Total 6 Click Score: 9    End of Session Equipment Utilized During Treatment: Gait belt  OT Visit Diagnosis: Unsteadiness on feet (R26.81);Cognitive communication deficit (R41.841);Pain;Hemiplegia and hemiparesis Symptoms and signs involving cognitive functions: Cerebral infarction Hemiplegia - Right/Left: Right Hemiplegia - dominant/non-dominant: Non-Dominant Hemiplegia - caused by: Cerebral infarction Pain - Right/Left: Right Pain - part of body: Shoulder   Activity Tolerance Patient tolerated treatment well   Patient Left in bed;with call bell/phone within reach;with bed alarm set;with family/visitor present   Nurse Communication Mobility status        Time: 1422-1440 OT Time Calculation (min): 18 min  Charges: OT General Charges $OT Visit: 1 Visit OT Treatments $Therapeutic Activity: 8-22 mins  Kristy Mcmillan., COTA/L Acute Rehabilitation Services [phone]  Corinne Ports [facility] 07/06/2020, 4:06 PM

## 2020-07-06 NOTE — Progress Notes (Signed)
STROKE TEAM PROGRESS NOTE   INTERVAL HISTORY Patient sitting up in chair, bright affect and interactive.  Plan discussed with patient and husband by cellphone during patient visit.  TEE and loop recorder as scheduled for Monday.  Patient has been seen by therapist who recommend inpatient rehab patient and husband are willing for that.  Vital signs are stable.  Neurological exam unchanged.   Vitals:   07/05/20 2150 07/05/20 2347 07/06/20 0338 07/06/20 0749  BP: 119/84 131/77 121/71 135/83  Pulse: 88 80 (!) 102 96  Resp: 10 18 18 16   Temp: 98.7 F (37.1 C) (!) 97.4 F (36.3 C) 98.4 F (36.9 C) 97.6 F (36.4 C)  TempSrc: Oral Axillary Oral Oral  SpO2: 98% 98% 99% 96%   CBC:  Recent Labs  Lab 07/03/20 1803 07/04/20 0251  WBC 8.9 10.7*  NEUTROABS 4.6  --   HGB 11.5* 11.7*  HCT 33.4* 33.1*  MCV 81.5 80.3  PLT 427* 017*   Basic Metabolic Panel:  Recent Labs  Lab 07/03/20 1803  NA 138  K 4.0  CL 106  CO2 26  GLUCOSE 271*  BUN 30*  CREATININE 0.94  CALCIUM 9.0   Lipid Panel:  Recent Labs  Lab 07/04/20 0251  CHOL 190  TRIG 305*  HDL 32*  CHOLHDL 5.9  VLDL 61*  LDLCALC 97   HgbA1c:  Recent Labs  Lab 07/04/20 0251  HGBA1C 8.1*   Urine Drug Screen:  Recent Labs  Lab 07/05/20 1221  LABOPIA NONE DETECTED  COCAINSCRNUR NONE DETECTED  LABBENZ NONE DETECTED  AMPHETMU NONE DETECTED  THCU NONE DETECTED  LABBARB NONE DETECTED    Alcohol Level No results for input(s): ETH in the last 168 hours.  IMAGING past 24 hours No results found. PHYSICAL EXAM   Pleasant middle-age Caucasian lady lying comfortably in bed. Not in distress. .Afebrile. Head is nontraumatic. Neck is supple without bruit. Cardiac exam no murmur or gallop. Lungs are clear to auscultation. Distal pulses are well felt.  Neurological Exam : Alert and oriented x3 no aphasia with only minimal dysarthria. Extraocular movements are full range without nystagmus. Blinks to threat bilaterally. Right  mild lower facial weakness. Tongue midline. Motor system exam shows right hemiparesis with RUE 3/5 distally, proximal weakness vs. Pain limited exam due to chronic right shoulder pain RLE  2/5 normal strength on the left side. Sensation is intact. Coordination impaired on the right. Gait not tested.  ASSESSMENT/PLAN [name] is a 45 y.o. 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 Also with PAD for which angio was performed on 03/13/20 and underwent PTA of the right tibioperoneal trunk and placed on plavix ASA during same admission. She was noncompliant vs. misinformed and did not follow through with outpatient postoperative antibiotic regimen.  She presented yesterday with isolated right-sided weakness (RUE and RLE) consistent with a subcortical stroke. She stopped taking her Plavix and ASA one week ago because she thought it was causing right shoulder pain. She received TPA at [facility] on 1/4.   Stroke: Acute infarcts within the left corona radiata, thalamocapsular junction and lentiform nucleus  likely secondary to embolism from cryptogenic source  UDS re-ordered   Transfer to floor today   Code Stroke: CT head no acute abnormality.  Small chronic left thalamic infarct.   CTA head/neck: Negative for large vessel occlusion. Multifocal  moderate to severe bilateral P1 and P2 stenoses, left worse than right. Distal small vessel atheromatous irregularity about thintracranial circulation, advanced for age. Wide patency of the major arterial vasculatur of the neck.  MRI: 2.5 x 1.4 cm acute infarct within the left corona radiata,thalamocapsular junction and lentiform nucleus. No mass effect orevidence of hemorrhagic conversion.Background mild chronic small vessel ischemic disease.  2D Echo: EF 60-65%, needs TEE  LDL  97  HgbA1c 8.1  VTE prophylaxis     Diet   Diet NPO time specified    On plavix and ASA prior to admission although not taking x one week, s/p TPA 1/4. Plan for plavix and ASA daily.   Therapy recommendations: Inpatient rehabilitation  Disposition:  Inpatient Rehabiliation  Hyperlipidemia  Home meds: atorvastatin 40mg  daily resumed in  hospital  LDL 97, goal < 70  Continue statin at discharge  Diabetes type II Uncontrolled  Home meds: Levemir 10u at HS  HgbA1c 8.1, goal < 7.0  CBGs Recent Labs    07/05/20 2125 07/06/20 0400 07/06/20 0600  GLUCAP 133* 136* 171*      SSI  Other Stroke Risk Factors  Current Cigarette smoker: advised to stopsmoking  Other Active Problems:  Right shoulder pain No acute findings on shoulder Xray May use RUE sling for comfort   Seizure Disorder: Continue zonisamide   Hospital day # 1 Recommend continue ongoing therapies. Transfer to rehab early next week when bed available.. Check TEE and loop recorder for paroxysmal A. fib on Monday. Aggressive risk factor modification. Aspirin for stroke prevention.  Discussed with patient and husband and answered questions.  Greater than 50% time during this 25-minute visit were spent on counseling and coordination of care about cryptogenic stroke and hemiplegia and answering questions Antony Contras, MD   To contact Stroke Continuity provider, please refer to [email or web address] After hours, contact General Neurology

## 2020-07-06 NOTE — Progress Notes (Addendum)
Inpatient Rehab Admissions Coordinator:   I spoke with pt. And her husband and both of them firmly stated that they are not interested in CIR due to financial cost of their copay. Pt.'s husband states that their home is handicap accessible and that he has a wheelchair for her if needed. They are interested in home health PT/OT/SLP. CIR will sign off.  Clemens Catholic, [place], [place] Admissions Coordinator  [phone] ([place]) [phone] (office)

## 2020-07-06 NOTE — Progress Notes (Signed)
   Kristy Mcmillan has been requested to perform a transesophageal echocardiogram on Kristy Mcmillan for stroke.  After careful review of history and examination, the risks and benefits of transesophageal echocardiogram have been explained including risks of esophageal damage, perforation (1:10,000 risk), bleeding, pharyngeal hematoma as well as other potential complications associated with conscious sedation including aspiration, arrhythmia, respiratory failure and death. Alternatives to treatment were discussed, questions were answered. Patient is willing to proceed.   Procedure scheduled for 07/09/2018 at 7:30am with Dr. Johney Frame. Will place orders.  Of note, patient seems very skeptical of everything. She is convinced that Plavix is what caused her stroke. She seemed skeptical and hesitant about the TEE as well but did agree to proceed.   Darreld Mclean, PA-C 07/06/2020 4:18 PM

## 2020-07-06 NOTE — Progress Notes (Signed)
Physical Therapy Treatment Patient Details Name: Kristy Mcmillan MRN: [id number] DOB: 07/06/1975 Today's Date: 07/06/2020    History of Present Illness Kristy Mcmillan is a 45 y.o. admitted severe right-sided weakness, taken to [facility], TPA was administered. CT There is no acute intracranial hemorrhage or evidence of acute infarction. Small chronic left thalamic infarct. PMH: seizures, DM    PT Comments    Pt tolerates treatment well despite R shoulder pain. Pt remains weak on R side but is able to perform transfers with some reduced assistance. Pt participates in pre-gait weight shifting and mini-squats to improve LE strength and balance. PT defers attempts at gait training due to weakness and high falls risk. Pt will continue to benefit from aggressive mobilization and acute PT POC to reduce falls risk and to reduce caregiver burden.   Follow Up Recommendations  CIR;Supervision/Assistance - 24 hour     Equipment Recommendations  Wheelchair (measurements PT);Wheelchair cushion (measurements PT);Hospital bed (if D/C today)    Recommendations for Other Services       Precautions / Restrictions Precautions Precautions: Fall Restrictions Weight Bearing Restrictions: No    Mobility  Bed Mobility Overal bed mobility: Needs Assistance Bed Mobility: Supine to Sit     Supine to sit: Min assist;HOB elevated        Transfers Overall transfer level: Needs assistance Equipment used: 1 person hand held assist Transfers: Sit to/from [employer] Transfers Sit to Stand: Mod assist Stand pivot transfers: Max assist       General transfer comment: pt performs weight shifting in standing with PT assist  Ambulation/Gait                 Stairs             Wheelchair Mobility    Modified Rankin (Stroke Patients Only) Modified Rankin (Stroke Patients Only) Pre-Morbid Rankin Score: No symptoms Modified Rankin: Severe disability     Balance Overall  balance assessment: Needs assistance Sitting-balance support: Single extremity supported;Feet supported Sitting balance-Leahy Scale: Poor Sitting balance - Comments: pt reliant on UE or external assist   Standing balance support: Bilateral upper extremity supported Standing balance-Leahy Scale: Poor Standing balance comment: minA for static standing with BUE support, modA with dynamic weight shifting                            Cognition Arousal/Alertness: Awake/alert Behavior During Therapy: WFL for tasks assessed/performed;Impulsive;Anxious Overall Cognitive Status: Impaired/Different from baseline Area of Impairment: Following commands;Safety/judgement;Awareness;Problem solving;Memory                     Memory: Decreased recall of precautions;Decreased short-term memory Following Commands: Follows one step commands consistently Safety/Judgement: Decreased awareness of safety;Decreased awareness of deficits Awareness: Emergent Problem Solving: Slow processing;Difficulty sequencing;Decreased initiation;Requires verbal cues;Requires tactile cues        Exercises General Exercises - Lower Extremity Ankle Circles/Pumps: AROM;Both;10 reps Quad Sets: AROM;Both;10 reps Gluteal Sets: AROM;Both;10 reps Straight Leg Raises: AROM;Both;10 reps    General Comments General comments (skin integrity, edema, etc.): VSS on RA      Pertinent Vitals/Pain Pain Assessment: Faces Faces Pain Scale: Hurts even more Pain Location: R shoulder Pain Descriptors / Indicators: Sore;Grimacing Pain Intervention(s): Monitored during session    Home Living                      Prior Function  PT Goals (current goals can now be found in the care plan section) Acute Rehab PT Goals Patient Stated Goal: to improve mobility and reduce pain Progress towards PT goals: Progressing toward goals    Frequency    Min 4X/week      PT Plan Current plan remains  appropriate    Co-evaluation              AM-PAC PT "6 Clicks" Mobility   Outcome Measure  Help needed turning from your back to your side while in a flat bed without using bedrails?: A Lot Help needed moving from lying on your back to sitting on the side of a flat bed without using bedrails?: A Lot Help needed moving to and from a bed to a chair (including a wheelchair)?: A Lot Help needed standing up from a chair using your arms (e.g., wheelchair or bedside chair)?: A Lot Help needed to walk in hospital room?: Total Help needed climbing 3-5 steps with a railing? : Total 6 Click Score: 10    End of Session   Activity Tolerance: Patient tolerated treatment well Patient left: in chair;with call bell/phone within reach;with chair alarm set Nurse Communication: Mobility status PT Visit Diagnosis: Other abnormalities of gait and mobility (R26.89);Hemiplegia and hemiparesis Hemiplegia - Right/Left: Right Hemiplegia - dominant/non-dominant: Dominant Hemiplegia - caused by: Other cerebrovascular disease Pain - Right/Left: Right Pain - part of body: Shoulder     Time: 2878-6767 PT Time Calculation (min) (ACUTE ONLY): 27 min  Charges:  $Therapeutic Exercise: 8-22 mins $Therapeutic Activity: 8-22 mins                     Zenaida Niece, PT, DPT Acute Rehabilitation Pager: [phone]    Zenaida Niece 07/06/2020, 11:39 AM

## 2020-07-06 NOTE — Progress Notes (Signed)
Occupational Therapy Treatment Patient Details Name: Kristy Mcmillan MRN: [id number] DOB: 05-25-1976 Today's Date: 07/06/2020    History of present illness Kristy Mcmillan is a 45 y.o. admitted severe right-sided weakness, taken to [facility], TPA was administered. CT There is no acute intracranial hemorrhage or evidence of acute infarction. Small chronic left thalamic infarct. PMH: seizures, DM   OT comments  Pt seen for second session to initiate RUE HEP for [facility] and increased ROM for higher level BADL tasks. Pt in chair position in bed with pt able to complete therex listed below, pt continues to c/o pain in pts R shoulder. Pt mostly needing AAROM to complete therex d/t RUE weakness. Pt requried tactile cues to follow HEP. Continue CIR recommendation, will follow acutely per POC.   Follow Up Recommendations  CIR;Supervision/Assistance - 24 hour    Equipment Recommendations  None recommended by OT    Recommendations for Other Services      Precautions / Restrictions Precautions Precautions: Fall Restrictions Weight Bearing Restrictions: No       Mobility Bed Mobility  Transfers    Balance                           ADL either performed or assessed with clinical judgement   ADL Overall ADL's : Needs assistance/impaired                     General ADL Comments: session focus on RUE Lawson HEP to increase strength and ROM for higher level BADLs     Vision   Perception     Praxis      Cognition Arousal/Alertness: Awake/alert Behavior During Therapy: WFL for tasks assessed/performed Overall Cognitive Status: Impaired/Different from baseline Area of Impairment: Following commands;Memory;Attention;Problem solving                   Current Attention Level: Focused Memory: Decreased short-term memory Following Commands: Follows one step commands inconsistently;Follows one step commands with increased time Safety/Judgement: Decreased  awareness of safety;Decreased awareness of deficits Awareness: Intellectual Problem Solving: Slow processing;Decreased initiation;Difficulty sequencing;Requires verbal cues;Requires tactile cues General Comments: increased cues to attend to HEP, tactile cues needed at times        Exercises General Exercises - Upper Extremity Shoulder Flexion: AAROM;Right;5 reps;Seated Elbow Flexion: AAROM;Right;5 reps;Seated Elbow Extension: AAROM;Right;5 reps;Seated Wrist Flexion: Right;5 reps;Seated;AROM Wrist Extension: AAROM;Right;5 reps;Seated Digit Composite Flexion: AROM;Right;5 reps;Seated Composite Extension: AAROM;Right;5 reps;Seated Hand Exercises Forearm Supination: AAROM;Right;5 reps;Seated;Limitations Forearm Supination Limitations: limited to 90* AROM Forearm Pronation: AROM;Right;5 reps;Seated Wrist Ulnar Deviation: AAROM;Right;5 reps;Seated Wrist Radial Deviation: AAROM;Right;5 reps;Seated Opposition: AROM;Right;Seated;5 reps;Other (comment) (very slow and guarded) Other Exercises Other Exercises: finger speading with palm flat on table x5 reps, requried AAROM Other Exercises: issued pt theraputty however pt will need further education as pt with difficulty extending digits after flexion   Shoulder Instructions       General Comments Issued pt written HEP for [facility] as well as level 1 theraputty to facilitate functional grasp and release, pt would benefit from formal written HEP for theraputty as pt currently lacks functional strength in R hand to begin formal HEP.     Pertinent Vitals/ Pain       Pain Assessment: Faces Faces Pain Scale: Hurts even more Pain Location: R shoulder Pain Descriptors / Indicators: Sore;Grimacing Pain Intervention(s): Limited activity within patient's tolerance;Monitored during session;Repositioned  Home Living  Prior Functioning/Environment              Frequency  Min 2X/week         Progress Toward Goals  OT Goals(current goals can now be found in the care plan section)  Progress towards OT goals: Progressing toward goals  Acute Rehab OT Goals Patient Stated Goal: to improve mobility and reduce pain OT Goal Formulation: With patient Time For Goal Achievement: 07/18/20 Potential to Achieve Goals: Good  Plan Discharge plan remains appropriate;Frequency remains appropriate    Co-evaluation                 AM-PAC OT "6 Clicks" Daily Activity     Outcome Measure   Help from another person eating meals?: A Little Help from another person taking care of personal grooming?: A Lot Help from another person toileting, which includes using toliet, bedpan, or urinal?: Total Help from another person bathing (including washing, rinsing, drying)?: Total Help from another person to put on and taking off regular upper body clothing?: Total Help from another person to put on and taking off regular lower body clothing?: Total 6 Click Score: 9    End of Session Equipment Utilized During Treatment: Gait belt  OT Visit Diagnosis: Unsteadiness on feet (R26.81);Cognitive communication deficit (R41.841);Pain;Hemiplegia and hemiparesis Symptoms and signs involving cognitive functions: Cerebral infarction Hemiplegia - Right/Left: Right Hemiplegia - dominant/non-dominant: Non-Dominant Hemiplegia - caused by: Cerebral infarction Pain - Right/Left: Right Pain - part of body: Shoulder   Activity Tolerance Patient tolerated treatment well   Patient Left in bed;with call bell/phone within reach;with bed alarm set   Nurse Communication Mobility status        Time: 0109-3235 OT Time Calculation (min): 15 min  Charges: OT General Charges $OT Visit: 1 Visit OT Treatments  $Therapeutic Exercise: 8-22 mins  Harley Alto., COTA/L Acute Rehabilitation Services [phone]    Precious Haws 07/06/2020, 4:17 PM

## 2020-07-07 DIAGNOSIS — I10 Essential (primary) hypertension: Secondary | ICD-10-CM

## 2020-07-07 DIAGNOSIS — F17200 Nicotine dependence, unspecified, uncomplicated: Secondary | ICD-10-CM

## 2020-07-07 DIAGNOSIS — E1165 Type 2 diabetes mellitus with hyperglycemia: Secondary | ICD-10-CM | POA: Diagnosis not present

## 2020-07-07 DIAGNOSIS — E7800 Pure hypercholesterolemia, unspecified: Secondary | ICD-10-CM | POA: Diagnosis not present

## 2020-07-07 DIAGNOSIS — G40909 Epilepsy, unspecified, not intractable, without status epilepticus: Secondary | ICD-10-CM

## 2020-07-07 DIAGNOSIS — I63312 Cerebral infarction due to thrombosis of left middle cerebral artery: Secondary | ICD-10-CM

## 2020-07-07 LAB — BASIC METABOLIC PANEL
Anion gap: 10 (ref 5–15)
BUN: 17 mg/dL (ref 6–20)
CO2: 21 mmol/L — ABNORMAL LOW (ref 22–32)
Calcium: 8.3 mg/dL — ABNORMAL LOW (ref 8.9–10.3)
Chloride: 108 mmol/L (ref 98–111)
Creatinine, Ser: 0.89 mg/dL (ref 0.44–1.00)
GFR, Estimated: >60 mL/min (ref >60)
Glucose, Bld: 173 mg/dL — ABNORMAL HIGH (ref 70–99)
Potassium: 3.5 mmol/L (ref 3.5–5.1)
Sodium: 139 mmol/L (ref 135–145)

## 2020-07-07 LAB — CBC
HCT: 32.7 % — ABNORMAL LOW (ref 36.0–46.0)
Hemoglobin: 11.8 g/dL — ABNORMAL LOW (ref 12.0–15.0)
MCH: 29 pg (ref 26.0–34.0)
MCHC: 36.1 g/dL — ABNORMAL HIGH (ref 30.0–36.0)
MCV: 80.3 fL (ref 80.0–100.0)
Platelets: 359 10*3/uL (ref 150–400)
RBC: 4.07 MIL/uL (ref 3.87–5.11)
RDW: 12.8 % (ref 11.5–15.5)
WBC: 8.4 10*3/uL (ref 4.0–10.5)
nRBC: 0 % (ref 0.0–0.2)

## 2020-07-07 LAB — GLUCOSE, CAPILLARY
Glucose-Capillary: 174 mg/dL — ABNORMAL HIGH (ref 70–99)
Glucose-Capillary: 168 mg/dL — ABNORMAL HIGH (ref 70–99)
Glucose-Capillary: 212 mg/dL — ABNORMAL HIGH (ref 70–99)
Glucose-Capillary: 229 mg/dL — ABNORMAL HIGH (ref 70–99)
Glucose-Capillary: 187 mg/dL — ABNORMAL HIGH (ref 70–99)
Glucose-Capillary: 96 mg/dL (ref 70–99)

## 2020-07-07 LAB — TROPONIN I (HIGH SENSITIVITY)
Troponin I (High Sensitivity): 3 ng/L (ref <18)
Troponin I (High Sensitivity): 3 ng/L (ref <18)

## 2020-07-07 MED ORDER — ENOXAPARIN SODIUM 40 MG/0.4ML ~~LOC~~ SOLN
40.0000 mg | SUBCUTANEOUS | Status: DC
  Administered 2020-07-07 – 2020-07-08 (×2): 40 mg via SUBCUTANEOUS
  Filled 2020-07-07 (×2): qty 0.4

## 2020-07-07 NOTE — Progress Notes (Signed)
STROKE TEAM PROGRESS NOTE   INTERVAL HISTORY Patient sitting up in bed for lunch, no acute event overnight. Pt told me that her insurance did not cover for CIR, and she will go home Monday for [facility] PT/OT after TEE.   Vitals:   07/06/20 2000 07/06/20 2348 07/07/20 0400 07/07/20 0747  BP: 127/82 128/79 139/79 127/79  Pulse: 85 89 75 90  Resp: 14 18 18 16   Temp: (!) 97.5 F (36.4 C) 98.2 F (36.8 C)  98.1 F (36.7 C)  TempSrc: Oral Oral  Oral  SpO2: 100% 100% 99% 99%   CBC:  Recent Labs  Lab 07/03/20 1803 07/04/20 0251 07/07/20 0410  WBC 8.9 10.7* 8.4  NEUTROABS 4.6  --   --   HGB 11.5* 11.7* 11.8*  HCT 33.4* 33.1* 32.7*  MCV 81.5 80.3 80.3  PLT 427* 408* 267   Basic Metabolic Panel:  Recent Labs  Lab 07/03/20 1803 07/07/20 0410  NA 138 139  K 4.0 3.5  CL 106 108  CO2 26 21*  GLUCOSE 271* 173*  BUN 30* 17  CREATININE 0.94 0.89  CALCIUM 9.0 8.3*   Lipid Panel:  Recent Labs  Lab 07/04/20 0251  CHOL 190  TRIG 305*  HDL 32*  CHOLHDL 5.9  VLDL 61*  LDLCALC 97   HgbA1c:  Recent Labs  Lab 07/04/20 0251  HGBA1C 8.1*   Urine Drug Screen:  Recent Labs  Lab 07/05/20 1221  LABOPIA NONE DETECTED  COCAINSCRNUR NONE DETECTED  LABBENZ NONE DETECTED  AMPHETMU NONE DETECTED  THCU NONE DETECTED  LABBARB NONE DETECTED    Alcohol Level No results for input(s): ETH in the last 168 hours.  IMAGING past 24 hours No results found.   PHYSICAL EXAM   Pleasant middle-age Caucasian lady lying comfortably in bed. Not in distress. .Afebrile. Head is nontraumatic. Neck is supple without bruit. Cardiac exam no murmur or gallop. Lungs are clear to auscultation. Distal pulses are well felt.  Neurological Exam : Alert and oriented x3 no aphasia with only minimal dysarthria. Extraocular movements are full range without nystagmus. Blinks to threat bilaterally. Right lower facial weakness. Tongue midline. Motor system exam shows right hemiparesis with RUE 3/5 distally and  proximally and RLE  2/5 strength. LUE and LLE 5/5. Sensation is intact. Coordination intact on the left. Gait not tested.  ASSESSMENT/PLAN [name] is a 45 y.o. 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 Also with PAD for which angio was performed on 03/13/20 and underwent PTA of the right tibioperoneal trunk and placed on plavix ASA during same admission. She was noncompliant vs. misinformed and did not follow through with outpatient postoperative antibiotic regimen.  She presented yesterday with isolated right-sided weakness (RUE and RLE) consistent with a subcortical stroke. She stopped taking her Plavix and ASA one week ago because she thought it was causing right shoulder pain. She received TPA at [place] on 1/4.   Stroke: Acute infarcts within the left BG/CR, likely secondary to small vessel disease  Code Stroke: CT head no acute abnormality.  Small chronic left thalamic infarct.  CTA head/neck: Negative for large vessel occlusion. 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 Marland Kitchen  MRI: 2.5 x 1.4 cm acute infarct within the left corona radiata,thalamocapsular junction and lentiform nucleus.    2D Echo: EF  60-65%  TEE pending  LDL 97  HgbA1c 8.1  VTE prophylaxis - lovenox  On plavix and ASA prior to admission although not taking x one week, now on plavix and ASA daily DAPT. Continue DAPT on discharge for PAD   Therapy recommendations: Inpatient rehabilitation  Disposition: CIR (pt and family declined) HH therapies planned  Hyperlipidemia  Home meds: atorvastatin 40 mg daily but likely noncompliant  LDL 97, goal < 70  On lipitor 40  Continue statin at discharge  Diabetes type II Uncontrolled  Home meds: Levemir 10u at HS  HgbA1c 8.1, goal <  7.0  CBGs  SSI  Needs better DM control  Follow up with PCP closely  Seizure disorder  On home zonisamide   Tobacco abuse  Current smoker  Smoking cessation counseling provided  Pt is willing to quit  Other Stroke Risk Factors   Other Active Problems, Findings and Recommendations  Right shoulder pain - No acute findings on shoulder Xray - May use RUE sling for comfort    Hospital day # 1   Kristy Hawking, MD PhD Stroke Neurology 07/07/2020 12:40 PM  To contact Stroke Continuity provider, please refer to [email or web address] After hours, contact General Neurology

## 2020-07-08 DIAGNOSIS — I63312 Cerebral infarction due to thrombosis of left middle cerebral artery: Secondary | ICD-10-CM | POA: Diagnosis not present

## 2020-07-08 DIAGNOSIS — E7800 Pure hypercholesterolemia, unspecified: Secondary | ICD-10-CM | POA: Diagnosis not present

## 2020-07-08 DIAGNOSIS — E1165 Type 2 diabetes mellitus with hyperglycemia: Secondary | ICD-10-CM | POA: Diagnosis not present

## 2020-07-08 DIAGNOSIS — F17200 Nicotine dependence, unspecified, uncomplicated: Secondary | ICD-10-CM | POA: Diagnosis not present

## 2020-07-08 LAB — GLUCOSE, CAPILLARY
Glucose-Capillary: 127 mg/dL — ABNORMAL HIGH (ref 70–99)
Glucose-Capillary: 128 mg/dL — ABNORMAL HIGH (ref 70–99)
Glucose-Capillary: 132 mg/dL — ABNORMAL HIGH (ref 70–99)
Glucose-Capillary: 135 mg/dL — ABNORMAL HIGH (ref 70–99)
Glucose-Capillary: 145 mg/dL — ABNORMAL HIGH (ref 70–99)
Glucose-Capillary: 168 mg/dL — ABNORMAL HIGH (ref 70–99)
Glucose-Capillary: 169 mg/dL — ABNORMAL HIGH (ref 70–99)

## 2020-07-08 LAB — CBC
HCT: 35.7 % — ABNORMAL LOW (ref 36.0–46.0)
Hemoglobin: 12.1 g/dL (ref 12.0–15.0)
MCH: 27.6 pg (ref 26.0–34.0)
MCHC: 33.9 g/dL (ref 30.0–36.0)
MCV: 81.3 fL (ref 80.0–100.0)
Platelets: 382 10*3/uL (ref 150–400)
RBC: 4.39 MIL/uL (ref 3.87–5.11)
RDW: 12.5 % (ref 11.5–15.5)
WBC: 10.2 10*3/uL (ref 4.0–10.5)
nRBC: 0 % (ref 0.0–0.2)

## 2020-07-08 LAB — BASIC METABOLIC PANEL
Anion gap: 11 (ref 5–15)
BUN: 14 mg/dL (ref 6–20)
CO2: 19 mmol/L — ABNORMAL LOW (ref 22–32)
Calcium: 8.8 mg/dL — ABNORMAL LOW (ref 8.9–10.3)
Chloride: 108 mmol/L (ref 98–111)
Creatinine, Ser: 0.76 mg/dL (ref 0.44–1.00)
GFR, Estimated: >60 mL/min (ref >60)
Glucose, Bld: 129 mg/dL — ABNORMAL HIGH (ref 70–99)
Potassium: 3.8 mmol/L (ref 3.5–5.1)
Sodium: 138 mmol/L (ref 135–145)

## 2020-07-08 NOTE — Progress Notes (Addendum)
Pt overtly cursing off and on (agitated) throughout the night w/ moments of happiness. Pt stating that she called the front desk about being wet, and no one came until I did my hourly round. Pt was slightly wet and I did peri-care w/ pt's consent. Pt then became upset that this writer would not offer her regular soda stating, "I do not want diet soda" to quench her thirst for soda. Pt offered water and declined also. Pt's cbg requiring 1 unit of insulin.

## 2020-07-08 NOTE — Plan of Care (Signed)
  Problem: Education: Goal: Knowledge of disease or condition will improve Outcome: Progressing Goal: Knowledge of secondary prevention will improve Outcome: Progressing Goal: Knowledge of patient specific risk factors addressed and post discharge goals established will improve Outcome: Progressing   Problem: Ischemic Stroke/TIA Tissue Perfusion: Goal: Complications of ischemic stroke/TIA will be minimized Outcome: Progressing

## 2020-07-08 NOTE — Progress Notes (Signed)
STROKE TEAM PROGRESS NOTE   INTERVAL HISTORY No family at bedside. Pt sitting in bed for lunch. She is eager to go home. I told her that we may able to get her home after procedure tomorrow. She had sharp chest pain yesterday, EKG and troponin neg. Chest pain resolved. Neuro stable and pending TEE tomorrow.  Vitals:   07/07/20 2353 07/08/20 0320 07/08/20 0742 07/08/20 1153  BP: 127/83 127/72 121/79 (!) 136/91  Pulse: 78 77 80 (!) 103  Resp: 18  14 17   Temp: 97.9 F (36.6 C) 98 F (36.7 C) 98.4 F (36.9 C) 98.2 F (36.8 C)  TempSrc: Oral Axillary Oral Oral  SpO2: 100% 100% 99% 100%   CBC:  Recent Labs  Lab 07/03/20 1803 07/04/20 0251 07/07/20 0410 07/08/20 0205  WBC 8.9   < > 8.4 10.2  NEUTROABS 4.6  --   --   --   HGB 11.5*   < > 11.8* 12.1  HCT 33.4*   < > 32.7* 35.7*  MCV 81.5   < > 80.3 81.3  PLT 427*   < > 359 382   < > = values in this interval not displayed.   Basic Metabolic Panel:  Recent Labs  Lab 07/07/20 0410 07/08/20 0205  NA 139 138  K 3.5 3.8  CL 108 108  CO2 21* 19*  GLUCOSE 173* 129*  BUN 17 14  CREATININE 0.89 0.76  CALCIUM 8.3* 8.8*   Lipid Panel:  Recent Labs  Lab 07/04/20 0251  CHOL 190  TRIG 305*  HDL 32*  CHOLHDL 5.9  VLDL 61*  LDLCALC 97   HgbA1c:  Recent Labs  Lab 07/04/20 0251  HGBA1C 8.1*   Urine Drug Screen:  Recent Labs  Lab 07/05/20 1221  LABOPIA NONE DETECTED  COCAINSCRNUR NONE DETECTED  LABBENZ NONE DETECTED  AMPHETMU NONE DETECTED  THCU NONE DETECTED  LABBARB NONE DETECTED    Alcohol Level No results for input(s): ETH in the last 168 hours.  IMAGING past 24 hours  No results found.   PHYSICAL EXAM   Pleasant middle-age Caucasian lady lying comfortably in bed. Not in distress. .Afebrile. Head is nontraumatic. Neck is supple without bruit. Cardiac exam no murmur or gallop. Lungs are clear to auscultation. Distal pulses are well felt.  Neurological Exam : Alert and oriented x3 no aphasia with only  minimal dysarthria. Extraocular movements are full range without nystagmus. Blinks to threat bilaterally. Right lower facial weakness. Tongue midline. Motor system exam shows right hemiparesis with RUE 3/5 distally and proximally and RLE  2/5 strength. LUE and LLE 5/5. Sensation is intact. Coordination intact on the left. Gait not tested.  ASSESSMENT/PLAN [name] is a 45 y.o. 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 Also with PAD for which angio was performed on 03/13/20 and underwent PTA of the right tibioperoneal trunk and placed on plavix ASA during same admission. She was noncompliant vs. misinformed and did not follow through with outpatient postoperative antibiotic regimen.  She presented yesterday with isolated right-sided weakness (RUE and RLE) consistent with a subcortical stroke. She stopped taking her Plavix and ASA one week ago because she thought it was causing right shoulder pain. She received TPA at [place] on 1/4.   Stroke: Acute infarcts within the left BG/CR, likely secondary to small vessel disease  Code Stroke:  CT head no acute abnormality.  Small chronic left thalamic infarct.  CTA head/neck: Negative for large vessel occlusion. 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 Marland Kitchen  MRI: 2.5 x 1.4 cm acute infarct within the left corona radiata,thalamocapsular junction and lentiform nucleus.    2D Echo: EF 60-65%  TEE pending   LDL 97  HgbA1c 8.1  VTE prophylaxis - lovenox  On plavix and ASA prior to admission although not taking x one week, now on plavix and ASA daily DAPT. Continue DAPT on discharge for PAD   Therapy recommendations: Inpatient rehabilitation  Disposition: CIR (pt and family declined) HH therapies planned  Hyperlipidemia  Home  meds: atorvastatin 40 mg daily but likely noncompliant  LDL 97, goal < 70  On lipitor 40  Continue statin at discharge  Diabetes type II Uncontrolled  Home meds: Levemir 10u at HS  HgbA1c 8.1, goal < 7.0  CBGs  SSI  Needs better DM control  Follow up with PCP closely  Seizure disorder  On home zonisamide   Tobacco abuse  Current smoker  Smoking cessation counseling provided  Pt is willing to quit  Other Stroke Risk Factors   Other Active Problems, Findings and Recommendations  Right shoulder pain - No acute findings on shoulder Xray - May use RUE sling for comfort   Chest pain 07/07/20 - EKG and troponin neg - resolved  Hospital day # 1  Rosalin Hawking, MD PhD Stroke Neurology 07/08/2020 6:39 PM   To contact Stroke Continuity provider, please refer to [email or web address] After hours, contact General Neurology

## 2020-07-08 NOTE — Progress Notes (Signed)
Occupational Therapy Treatment Patient Details Name: Kristy Mcmillan MRN: [id number] DOB: [birth year] Today's Date: 07/08/2020    History of present illness Kristy Mcmillan is a 45 y.o. admitted severe right-sided weakness, taken to [facility], TPA was administered. CT There is no acute intracranial hemorrhage or evidence of acute infarction. Small chronic left thalamic infarct. PMH: seizures, DM   OT comments  Pt. Seen for skilled OT treatment session.  Focus of session was continuation of HEP provided at previous session.  Pt. States "ive been doing them" in reference to exercises and rom.  Cues for technique and remembering all of the exercises.  Use of theraputty also.  Pt. With notable frustration with weakness in R digits.  Encouragement to continue trying.    Follow Up Recommendations  CIR;Supervision/Assistance - 24 hour    Equipment Recommendations  None recommended by OT    Recommendations for Other Services      Precautions / Restrictions Precautions Precautions: Fall       Mobility Bed Mobility                  Transfers                      Balance                                           ADL either performed or assessed with clinical judgement   ADL                                               Vision       Perception     Praxis      Cognition Arousal/Alertness: Awake/alert Behavior During Therapy: WFL for tasks assessed/performed Overall Cognitive Status: Impaired/Different from baseline Area of Impairment: Following commands;Memory;Attention;Problem solving                   Current Attention Level: Focused Memory: Decreased short-term memory Following Commands: Follows one step commands inconsistently;Follows one step commands with increased time Safety/Judgement: Decreased awareness of safety;Decreased awareness of deficits Awareness: Intellectual Problem Solving: Slow  processing;Decreased initiation;Difficulty sequencing;Requires verbal cues;Requires tactile cues General Comments: increased cues to attend to HEP, tactile cues needed at times-tendancy to gesutre, encouragement to say the word/words of the things she wants        Exercises General Exercises - Upper Extremity Shoulder Flexion: AAROM;Right;5 reps;Seated Elbow Flexion: AAROM;Right;5 reps;Seated Elbow Extension: AAROM;Right;5 reps;Seated Wrist Flexion: Right;5 reps;Seated;AROM Wrist Extension: AAROM;Right;5 reps;Seated Digit Composite Flexion: AROM;Right;5 reps;Seated Composite Extension: AAROM;Right;5 reps;Seated Hand Exercises Forearm Supination: AAROM;Right;5 reps;Seated;Limitations Forearm Pronation: AROM;Right;5 reps;Seated Wrist Ulnar Deviation: AAROM;Right;5 reps;Seated Wrist Radial Deviation: AAROM;Right;5 reps;Seated Opposition: AROM;Right;Seated;5 reps;Other (comment) Other Exercises Other Exercises: finger speading with palm flat on table x5 reps, requried AAROM Other Exercises: reviewed use of theraputty and had pt. use during session   Shoulder Instructions       General Comments      Pertinent Vitals/ Pain       Pain Assessment: No/denies pain  Home Living  Prior Functioning/Environment              Frequency  Min 2X/week        Progress Toward Goals  OT Goals(current goals can now be found in the care plan section)  Progress towards OT goals: Progressing toward goals     Plan Discharge plan remains appropriate;Frequency remains appropriate    Co-evaluation                 AM-PAC OT "6 Clicks" Daily Activity     Outcome Measure   Help from another person eating meals?: A Little Help from another person taking care of personal grooming?: A Lot Help from another person toileting, which includes using toliet, bedpan, or urinal?: Total Help from another person bathing (including  washing, rinsing, drying)?: Total Help from another person to put on and taking off regular upper body clothing?: Total Help from another person to put on and taking off regular lower body clothing?: Total 6 Click Score: 9    End of Session    OT Visit Diagnosis: Unsteadiness on feet (R26.81);Cognitive communication deficit (R41.841);Pain;Hemiplegia and hemiparesis Symptoms and signs involving cognitive functions: Cerebral infarction Hemiplegia - Right/Left: Right Hemiplegia - dominant/non-dominant: Non-Dominant Hemiplegia - caused by: Cerebral infarction Pain - Right/Left: Right Pain - part of body: Shoulder   Activity Tolerance Patient tolerated treatment well   Patient Left in bed;with call bell/phone within reach;with bed alarm set   Nurse Communication Other (comment) (pt. request for bedding/linen change and for blood sugar to be checked)        Time: 1157-2620 OT Time Calculation (min): 16 min  Charges: OT General Charges $OT Visit: 1 Visit OT Treatments $Therapeutic Exercise: 8-22 mins  Kristy Mcmillan, COTA/L Acute Rehabilitation [phone]   Kristy Mcmillan 07/08/2020, 11:22 AM

## 2020-07-08 NOTE — H&P (View-Only) (Signed)
STROKE TEAM PROGRESS NOTE   INTERVAL HISTORY No family at bedside. Pt sitting in bed for lunch. She is eager to go home. I told her that we may able to get her home after procedure tomorrow. She had sharp chest pain yesterday, EKG and troponin neg. Chest pain resolved. Neuro stable and pending TEE tomorrow.  Vitals:   07/07/20 2353 07/08/20 0320 07/08/20 0742 07/08/20 1153  BP: 127/83 127/72 121/79 (!) 136/91  Pulse: 78 77 80 (!) 103  Resp: 18  14 17  Temp: 97.9 F (36.6 C) 98 F (36.7 C) 98.4 F (36.9 C) 98.2 F (36.8 C)  TempSrc: Oral Axillary Oral Oral  SpO2: 100% 100% 99% 100%   CBC:  Recent Labs  Lab 07/03/20 1803 07/04/20 0251 07/07/20 0410 07/08/20 0205  WBC 8.9   < > 8.4 10.2  NEUTROABS 4.6  --   --   --   HGB 11.5*   < > 11.8* 12.1  HCT 33.4*   < > 32.7* 35.7*  MCV 81.5   < > 80.3 81.3  PLT 427*   < > 359 382   < > = values in this interval not displayed.   Basic Metabolic Panel:  Recent Labs  Lab 07/07/20 0410 07/08/20 0205  NA 139 138  K 3.5 3.8  CL 108 108  CO2 21* 19*  GLUCOSE 173* 129*  BUN 17 14  CREATININE 0.89 0.76  CALCIUM 8.3* 8.8*   Lipid Panel:  Recent Labs  Lab 07/04/20 0251  CHOL 190  TRIG 305*  HDL 32*  CHOLHDL 5.9  VLDL 61*  LDLCALC 97   HgbA1c:  Recent Labs  Lab 07/04/20 0251  HGBA1C 8.1*   Urine Drug Screen:  Recent Labs  Lab 07/05/20 1221  LABOPIA NONE DETECTED  COCAINSCRNUR NONE DETECTED  LABBENZ NONE DETECTED  AMPHETMU NONE DETECTED  THCU NONE DETECTED  LABBARB NONE DETECTED    Alcohol Level No results for input(s): ETH in the last 168 hours.  IMAGING past 24 hours  No results found.   PHYSICAL EXAM   Pleasant middle-age Caucasian lady lying comfortably in bed. Not in distress. .Afebrile. Head is nontraumatic. Neck is supple without bruit. Cardiac exam no murmur or gallop. Lungs are clear to auscultation. Distal pulses are well felt.  Neurological Exam : Alert and oriented x3 no aphasia with only  minimal dysarthria. Extraocular movements are full range without nystagmus. Blinks to threat bilaterally. Right lower facial weakness. Tongue midline. Motor system exam shows right hemiparesis with RUE 3/5 distally and proximally and RLE  2/5 strength. LUE and LLE 5/5. Sensation is intact. Coordination intact on the left. Gait not tested.  ASSESSMENT/PLAN [name] is a 45 y.o. 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 Also with PAD for which angio was performed on 03/13/20 and underwent PTA of the right tibioperoneal trunk and placed on plavix ASA during same admission. She was noncompliant vs. misinformed and did not follow through with outpatient postoperative antibiotic regimen.  She presented yesterday with isolated right-sided weakness (RUE and RLE) consistent with a subcortical stroke. She stopped taking her Plavix and ASA one week ago because she thought it was causing right shoulder pain. She received TPA at 1906 on 1/4.   Stroke: Acute infarcts within the left BG/CR, likely secondary to small vessel disease  Code Stroke:   CT head no acute abnormality.  Small chronic left thalamic infarct.  CTA head/neck: Negative for large vessel occlusion. 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 Marland Kitchen  MRI: 2.5 x 1.4 cm acute infarct within the left corona radiata,thalamocapsular junction and lentiform nucleus.    2D Echo: EF 60-65%  TEE pending   LDL 97  HgbA1c 8.1  VTE prophylaxis - lovenox  On plavix and ASA prior to admission although not taking x one week, now on plavix and ASA daily DAPT. Continue DAPT on discharge for PAD   Therapy recommendations: Inpatient rehabilitation  Disposition: CIR (pt and family declined) HH therapies planned  Hyperlipidemia  Home  meds: atorvastatin 40 mg daily but likely noncompliant  LDL 97, goal < 70  On lipitor 40  Continue statin at discharge  Diabetes type II Uncontrolled  Home meds: Levemir 10u at HS  HgbA1c 8.1, goal < 7.0  CBGs  SSI  Needs better DM control  Follow up with PCP closely  Seizure disorder  On home zonisamide   Tobacco abuse  Current smoker  Smoking cessation counseling provided  Pt is willing to quit  Other Stroke Risk Factors   Other Active Problems, Findings and Recommendations  Right shoulder pain - No acute findings on shoulder Xray - May use RUE sling for comfort   Chest pain 07/07/20 - EKG and troponin neg - resolved  Hospital day # 1  Rosalin Hawking, MD PhD Stroke Neurology 07/08/2020 6:39 PM   To contact Stroke Continuity provider, please refer to [email or web address] After hours, contact General Neurology

## 2020-07-09 ENCOUNTER — Inpatient Hospital Stay (HOSPITAL_COMMUNITY): Payer: Medicare Other

## 2020-07-09 ENCOUNTER — Encounter (HOSPITAL_COMMUNITY): Payer: Self-pay | Admitting: Neurology

## 2020-07-09 ENCOUNTER — Encounter (HOSPITAL_COMMUNITY)
Admission: AD | Disposition: A | Discharge status: Home Health | Payer: Self-pay | Source: Other Acute Inpatient Hospital | Attending: Neurology

## 2020-07-09 ENCOUNTER — Inpatient Hospital Stay (HOSPITAL_COMMUNITY): Payer: Medicare Other | Admitting: Anesthesiology

## 2020-07-09 ENCOUNTER — Other Ambulatory Visit: Payer: Self-pay | Admitting: Neurology

## 2020-07-09 ENCOUNTER — Ambulatory Visit (HOSPITAL_COMMUNITY): Admit: 2020-07-09 | Payer: Medicare Other | Admitting: Cardiology

## 2020-07-09 ENCOUNTER — Other Ambulatory Visit: Payer: Self-pay | Admitting: Adult Health

## 2020-07-09 DIAGNOSIS — F17200 Nicotine dependence, unspecified, uncomplicated: Secondary | ICD-10-CM | POA: Diagnosis not present

## 2020-07-09 DIAGNOSIS — E7800 Pure hypercholesterolemia, unspecified: Secondary | ICD-10-CM | POA: Diagnosis not present

## 2020-07-09 DIAGNOSIS — I639 Cerebral infarction, unspecified: Secondary | ICD-10-CM | POA: Diagnosis not present

## 2020-07-09 DIAGNOSIS — I63312 Cerebral infarction due to thrombosis of left middle cerebral artery: Secondary | ICD-10-CM | POA: Diagnosis not present

## 2020-07-09 DIAGNOSIS — I10 Essential (primary) hypertension: Secondary | ICD-10-CM | POA: Diagnosis not present

## 2020-07-09 HISTORY — PX: TEE WITHOUT CARDIOVERSION: SHX5443

## 2020-07-09 HISTORY — PX: BUBBLE STUDY: SHX6837

## 2020-07-09 LAB — BASIC METABOLIC PANEL
Anion gap: 11 (ref 5–15)
BUN: 20 mg/dL (ref 6–20)
CO2: 21 mmol/L — ABNORMAL LOW (ref 22–32)
Calcium: 8.9 mg/dL (ref 8.9–10.3)
Chloride: 106 mmol/L (ref 98–111)
Creatinine, Ser: 1.02 mg/dL — ABNORMAL HIGH (ref 0.44–1.00)
GFR, Estimated: >60 mL/min (ref >60)
Glucose, Bld: 164 mg/dL — ABNORMAL HIGH (ref 70–99)
Potassium: 4 mmol/L (ref 3.5–5.1)
Sodium: 138 mmol/L (ref 135–145)

## 2020-07-09 LAB — GLUCOSE, CAPILLARY
Glucose-Capillary: 150 mg/dL — ABNORMAL HIGH (ref 70–99)
Glucose-Capillary: 162 mg/dL — ABNORMAL HIGH (ref 70–99)
Glucose-Capillary: 166 mg/dL — ABNORMAL HIGH (ref 70–99)

## 2020-07-09 LAB — CBC
HCT: 37 % (ref 36.0–46.0)
Hemoglobin: 12.5 g/dL (ref 12.0–15.0)
MCH: 27.7 pg (ref 26.0–34.0)
MCHC: 33.8 g/dL (ref 30.0–36.0)
MCV: 82 fL (ref 80.0–100.0)
Platelets: 370 10*3/uL (ref 150–400)
RBC: 4.51 MIL/uL (ref 3.87–5.11)
RDW: 12.6 % (ref 11.5–15.5)
WBC: 10 10*3/uL (ref 4.0–10.5)
nRBC: 0 % (ref 0.0–0.2)

## 2020-07-09 SURGERY — ECHOCARDIOGRAM, TRANSESOPHAGEAL
Anesthesia: Monitor Anesthesia Care

## 2020-07-09 MED ORDER — DEXMEDETOMIDINE (PRECEDEX) IN NS 20 MCG/5ML (4 MCG/ML) IV SYRINGE
PREFILLED_SYRINGE | INTRAVENOUS | Status: DC | PRN
  Administered 2020-07-09 (×2): 4 ug via INTRAVENOUS

## 2020-07-09 MED ORDER — GLYCOPYRROLATE 0.2 MG/ML IJ SOLN
INTRAMUSCULAR | Status: DC | PRN
  Administered 2020-07-09 (×2): .1 mg via INTRAVENOUS

## 2020-07-09 MED ORDER — PROPOFOL 500 MG/50ML IV EMUL
INTRAVENOUS | Status: DC | PRN
  Administered 2020-07-09: 125 ug/kg/min via INTRAVENOUS

## 2020-07-09 MED ORDER — LIDOCAINE VISCOUS HCL 2 % MT SOLN
OROMUCOSAL | Status: AC
  Filled 2020-07-09: qty 15

## 2020-07-09 MED ORDER — LACTATED RINGERS IV SOLN
INTRAVENOUS | Status: AC | PRN
  Administered 2020-07-09: 1000 mL via INTRAVENOUS

## 2020-07-09 MED ORDER — SODIUM CHLORIDE 0.9 % IV SOLN: INTRAVENOUS | Status: DC

## 2020-07-09 MED ORDER — CLOPIDOGREL BISULFATE 75 MG PO TABS: 75.0000 mg | ORAL_TABLET | Freq: Every day | ORAL | 2 refills | Status: DC

## 2020-07-09 MED ORDER — LIDOCAINE VISCOUS HCL 2 % MT SOLN
OROMUCOSAL | Status: DC | PRN
  Administered 2020-07-09: 1 via OROMUCOSAL

## 2020-07-09 MED ORDER — PROPOFOL 10 MG/ML IV BOLUS
INTRAVENOUS | Status: DC | PRN
Start: 2020-07-09 — End: 2020-07-09
  Administered 2020-07-09: 10 mg via INTRAVENOUS
  Administered 2020-07-09: 30 mg via INTRAVENOUS
  Administered 2020-07-09: 20 mg via INTRAVENOUS

## 2020-07-09 MED ORDER — LIDOCAINE HCL (CARDIAC) PF 100 MG/5ML IV SOSY
PREFILLED_SYRINGE | INTRAVENOUS | Status: DC | PRN
  Administered 2020-07-09: 40 mg via INTRAVENOUS

## 2020-07-09 NOTE — Progress Notes (Signed)
  Echocardiogram Echocardiogram Transesophageal has been performed.  Kristy Mcmillan 07/09/2020, 8:08 AM

## 2020-07-09 NOTE — Plan of Care (Signed)
°  Problem: Education: Goal: Knowledge of disease or condition will improve Outcome: Progressing Goal: Knowledge of secondary prevention will improve Outcome: Progressing Goal: Knowledge of patient specific risk factors addressed and post discharge goals established will improve Outcome: Progressing   Problem: Ischemic Stroke/TIA Tissue Perfusion: Goal: Complications of ischemic stroke/TIA will be minimized Outcome: Progressing   

## 2020-07-09 NOTE — Plan of Care (Signed)
  Problem: Clinical Measurements: Goal: Ability to maintain clinical measurements within normal limits will improve Outcome: Progressing Goal: Will remain free from infection Outcome: Progressing Goal: Diagnostic test results will improve Outcome: Progressing Goal: Respiratory complications will improve Outcome: Progressing Goal: Cardiovascular complication will be avoided Outcome: Progressing   Problem: Activity: Goal: Risk for activity intolerance will decrease Outcome: Progressing   Problem: Nutrition: Goal: Adequate nutrition will be maintained Outcome: Progressing   Problem: Education: Goal: Knowledge of disease or condition will improve Outcome: Progressing Goal: Knowledge of secondary prevention will improve Outcome: Progressing Goal: Knowledge of patient specific risk factors addressed and post discharge goals established will improve Outcome: Progressing   Problem: Ischemic Stroke/TIA Tissue Perfusion: Goal: Complications of ischemic stroke/TIA will be minimized Outcome: Progressing

## 2020-07-09 NOTE — Interval H&P Note (Signed)
History and Physical Interval Note:  07/09/2020 7:30 AM  Kristy Mcmillan  has presented today for surgery, with the diagnosis of STROKE.  The various methods of treatment have been discussed with the patient and family. After consideration of risks, benefits and other options for treatment, the patient has consented to  Procedure(s): TRANSESOPHAGEAL ECHOCARDIOGRAM (TEE) (N/A) as a surgical intervention.  The patient's history has been reviewed, patient examined, no change in status, stable for surgery.  I have reviewed the patient's chart and labs.  Questions were answered to the patient's satisfaction.     Freada Bergeron

## 2020-07-09 NOTE — Anesthesia Preprocedure Evaluation (Signed)
Anesthesia Evaluation  Patient identified by MRN, date of birth, ID band Patient awake    Reviewed: Allergy & Precautions, H&P , NPO status , Patient's Chart, lab work & pertinent test results  Airway Mallampati: II   Neck ROM: full    Dental   Pulmonary Current Smoker and Patient abstained from smoking.,    breath sounds clear to auscultation       Cardiovascular + Peripheral Vascular Disease   Rhythm:regular Rate:Normal     Neuro/Psych  Headaches, Seizures -,  CVA    GI/Hepatic GERD  ,  Endo/Other  diabetes, Type 2  Renal/GU      Musculoskeletal   Abdominal   Peds  Hematology   Anesthesia Other Findings   Reproductive/Obstetrics                             Anesthesia Physical Anesthesia Plan  ASA: II  Anesthesia Plan: MAC   Post-op Pain Management:    Induction: Intravenous  PONV Risk Score and Plan: 1 and Propofol infusion and Treatment may vary due to age or medical condition  Airway Management Planned: Nasal Cannula  Additional Equipment:   Intra-op Plan:   Post-operative Plan:   Informed Consent: I have reviewed the patients History and Physical, chart, labs and discussed the procedure including the risks, benefits and alternatives for the proposed anesthesia with the patient or authorized representative who has indicated his/her understanding and acceptance.       Plan Discussed with: CRNA, Anesthesiologist and Surgeon  Anesthesia Plan Comments:         Anesthesia Quick Evaluation

## 2020-07-09 NOTE — Progress Notes (Signed)
PT Cancellation Note  Patient Details Name: Kristy Mcmillan MRN: [id number] DOB: 08/25/1975   Cancelled Treatment:    Reason Eval/Treat Not Completed: Patient at procedure or test/unavailable. Pt currently off unit for TEE. Will check back as schedule allows to continue with PT POC.    Thelma Comp 07/09/2020, 7:34 AM   Rolinda Roan, PT, DPT Acute Rehabilitation Services Pager: [phone] Office: [phone]

## 2020-07-09 NOTE — Transfer of Care (Signed)
Immediate Anesthesia Transfer of Care Note  Patient: Kristy Mcmillan  Procedure(s) Performed: TRANSESOPHAGEAL ECHOCARDIOGRAM (TEE) (N/A ) BUBBLE STUDY  Patient Location: PACU  Anesthesia Type:MAC  Level of Consciousness: drowsy  Airway & Oxygen Therapy: Patient Spontanous Breathing  Post-op Assessment: Report given to RN and Post -op Vital signs reviewed and stable  Post vital signs: Reviewed and stable  Last Vitals:  Vitals Value Taken Time  BP 103/54 07/09/20 0805  Temp 36.7 C 07/09/20 0805  Pulse 91 07/09/20 0806  Resp 18 07/09/20 0806  SpO2 98 % 07/09/20 0806  Vitals shown include unvalidated device data.  Last Pain:  Vitals:   07/09/20 0805  TempSrc: Axillary  PainSc:       Patients Stated Pain Goal: 1 (16/10/96 0454)  Complications: No complications documented.

## 2020-07-09 NOTE — Care Management Important Message (Signed)
Important Message  Patient Details  Name: EMUNAH TEXIDOR MRN: [id number] Date of Birth: 13-May-1976   Medicare Important Message Given:  Yes     Hance Caspers 07/09/2020, 4:21 PM

## 2020-07-09 NOTE — Procedures (Addendum)
     Transesophageal Echocardiogram Note  LEIDI ASTLE [id number] 07/27/1975  Procedure: Transesophageal Echocardiogram Indications: Stroke  Procedure Details Consent: Obtained Time Out: Verified patient identification, verified procedure, site/side was marked, verified correct patient position, special equipment/implants available, Radiology Safety Procedures followed,  medications/allergies/relevent history reviewed, required imaging and test results available.  Performed  Medications: Propofol: 140mg   Lidocaine: 40mg   Administered by CV anesthesia  Left Ventrical:  LVEF 60-65%, aneurysmal basal inferoseptum  Mitral Valve: Trace MR  Aortic Valve: Trileaflet. No AI, no stenosis  Tricuspid Valve: Trace TR  Pulmonic Valve:Trace PI  Left Atrium/ Left atrial appendage: No evidence of LAA thrombus  Atrial septum: No PFO by color doppler of agitated saline bubble study  Aorta: Minimal plaquing   Complications: No apparent complications Patient did tolerate procedure well.  Freada Bergeron, MD 07/09/2020, 7:59 AM

## 2020-07-09 NOTE — TOC Transition Note (Signed)
Transition of Care [facility]) - CM/SW Discharge Note   Patient Details  Name: Kristy Mcmillan MRN: [id number] Date of Birth: 11-Aug-1975  [facility] CM/SW Contact:  Pollie Friar, RN Phone Number: 07/09/2020, 4:38 PM   Clinical Narrative:    Pt discharged today without [facility] services being arranged. CM called and spoke to the patient over the phone and she asked to use [facility]. CM called Kenzie with [facility] and they accepted the referral.  Pt had transport home and needed DME at home.   Final next level of care: Home w Home Health Services Barriers to Discharge: No Barriers Identified   Patient Goals and CMS Choice   CMS Medicare.gov Compare Post Acute Care list provided to:: Patient Choice offered to / list presented to : Patient  Discharge Placement                       Discharge Plan and Services                          HH Arranged: PT,OT [place]: [place] ([place]) Date [place]: 07/09/20   Representative spoke with at [place]: [place] ([place]) Interventions     Readmission Risk Interventions No flowsheet data found.

## 2020-07-09 NOTE — Discharge Summary (Addendum)
Stroke Discharge Summary  Patient ID: Kristy Mcmillan   MRN: [id number]      DOB: [birth year]  Date of Admission: 07/04/2020 Date of Discharge: 07/09/2020  Attending Physician: Stroke MD Consultant(s):    Cardiology, EP  Patient's PCP:  [facility] [place] DIAGNOSIS:  Active Problems:   Stroke - left BG/CR infarct   HLD   Uncontrolled DM   Seizure disorder   smoker   Allergies as of 07/09/2020      Reactions   Dilantin [phenytoin Sodium Extended] Other (See Comments)   Causes seizures   Phenobarbital Other (See Comments)   Causes seizures Other reaction(s): Unknown Causes seizures   Phenytoin Sodium Extended    Other reaction(s): Other (See Comments) Causes seizures   Aspartame Diarrhea, Nausea And Vomiting   Aspartame And Phenylalanine Diarrhea, Nausea And Vomiting   Carbamazepine Other (See Comments)   Other reaction(s): Unknown   Cymbalta [duloxetine Hcl] Diarrhea   Gabapentin Other (See Comments)   Other reaction(s): Unknown Feels like she is going to have a seizure   Amoxicillin Rash   Ciprofloxacin Rash   blisters   Humalog [insulin Lispro] Rash   Lantus [insulin Glargine] Rash   Novolin 70-30 [insulin Nph Isophane & Regular] Itching, Rash   Penicillins Itching, Rash      Medication List    STOP taking these medications   ibuprofen 200 MG tablet Commonly known as: ADVIL     TAKE these medications   albuterol 108 (90 Base) MCG/ACT inhaler Commonly known as: VENTOLIN HFA Inhale 1 puff into the lungs every 6 (six) hours as needed for wheezing or shortness of breath.   aspirin 81 MG EC tablet Take 1 tablet (81 mg total) by mouth daily. Swallow whole.   atorvastatin 40 MG tablet Commonly known as: LIPITOR Take 40 mg by mouth at bedtime.   clopidogrel 75 MG tablet Commonly known as: PLAVIX Take 1 tablet (75 mg total) by mouth daily with breakfast.   DULoxetine 30 MG capsule Commonly known as: CYMBALTA Take 30 mg by mouth 2  (two) times daily.   ferrous sulfate 325 (65 FE) MG tablet Take 1 tablet (325 mg total) by mouth daily with breakfast.   fluticasone 50 MCG/ACT nasal spray Commonly known as: FLONASE Place 2 sprays into both nostrils daily.   linagliptin 5 MG Tabs tablet Commonly known as: TRADJENTA Take 5 mg by mouth daily.   lisinopril 2.5 MG tablet Commonly known as: ZESTRIL Take 2.5 mg by mouth daily.   multivitamin with minerals tablet Take 1 tablet by mouth daily.   nortriptyline 10 MG capsule Commonly known as: PAMELOR Take 40 mg by mouth at bedtime.   omeprazole 20 MG capsule Commonly known as: PRILOSEC Take 20 mg by mouth daily.   zonisamide 100 MG capsule Commonly known as: ZONEGRAN Take 100 mg by mouth daily.       LABORATORY STUDIES CBC    Component Value Date/Time   WBC 10.0 07/09/2020 0512   RBC 4.51 07/09/2020 0512   HGB 12.5 07/09/2020 0512   HGB 11.6 (L) 10/23/2012 1807   HCT 37.0 07/09/2020 0512   HCT 34.0 (L) 10/23/2012 1807   PLT 370 07/09/2020 0512   PLT 367 10/23/2012 1807   MCV 82.0 07/09/2020 0512   MCV 83 10/23/2012 1807   MCH 27.7 07/09/2020 0512   MCHC 33.8 07/09/2020 0512   RDW 12.6 07/09/2020 0512   RDW 12.8 10/23/2012 1807   LYMPHSABS  3.4 07/03/2020 1803   MONOABS 0.5 07/03/2020 1803   EOSABS 0.2 07/03/2020 1803   BASOSABS 0.1 07/03/2020 1803   CMP    Component Value Date/Time   NA 138 07/09/2020 0512   NA 134 (L) 10/23/2012 1807   K 4.0 07/09/2020 0512   K 4.2 10/23/2012 1807   CL 106 07/09/2020 0512   CL 103 10/23/2012 1807   CO2 21 (L) 07/09/2020 0512   CO2 23 10/23/2012 1807   GLUCOSE 164 (H) 07/09/2020 0512   GLUCOSE 238 (H) 10/23/2012 1807   BUN 20 07/09/2020 0512   BUN 8 10/23/2012 1807   CREATININE 1.02 (H) 07/09/2020 0512   CREATININE 0.59 10/25/2012 1207   CALCIUM 8.9 07/09/2020 0512   CALCIUM 8.6 10/23/2012 1807   PROT 7.1 07/03/2020 1803   PROT 6.9 10/23/2012 1807   ALBUMIN 3.2 (L) 07/03/2020 1803   ALBUMIN 2.6  (L) 10/23/2012 1807   AST 13 (L) 07/03/2020 1803   AST 13 (L) 10/23/2012 1807   ALT 19 07/03/2020 1803   ALT 14 10/23/2012 1807   ALKPHOS 77 07/03/2020 1803   ALKPHOS 64 10/23/2012 1807   BILITOT 0.6 07/03/2020 1803   BILITOT 0.3 10/23/2012 1807   GFRNONAA >60 07/09/2020 0512   GFRNONAA >60 10/23/2012 1807   GFRAA >60 03/16/2020 0428   GFRAA >60 10/23/2012 1807   COAGS Lab Results  Component Value Date   INR 1.0 07/03/2020   Lipid Panel    Component Value Date/Time   CHOL 190 07/04/2020 0251   TRIG 305 (H) 07/04/2020 0251   HDL 32 (L) 07/04/2020 0251   CHOLHDL 5.9 07/04/2020 0251   VLDL 61 (H) 07/04/2020 0251   LDLCALC 97 07/04/2020 0251   HgbA1C  Lab Results  Component Value Date   HGBA1C 8.1 (H) 07/04/2020   Urinalysis    Component Value Date/Time   COLORURINE STRAW (A) 06/24/2018 2306   APPEARANCEUR CLEAR (A) 06/24/2018 2306   APPEARANCEUR Hazy 10/23/2012 1807   LABSPEC 1.029 06/24/2018 2306   LABSPEC 1.011 10/23/2012 1807   PHURINE 5.0 06/24/2018 2306   GLUCOSEU >=500 (A) 06/24/2018 2306   GLUCOSEU >=500 10/23/2012 1807   HGBUR SMALL (A) 06/24/2018 2306   BILIRUBINUR NEGATIVE 06/24/2018 2306   BILIRUBINUR Negative 10/23/2012 1807   KETONESUR NEGATIVE 06/24/2018 2306   PROTEINUR 100 (A) 06/24/2018 2306   UROBILINOGEN 0.2 10/25/2012 0936   NITRITE NEGATIVE 06/24/2018 2306   LEUKOCYTESUR SMALL (A) 06/24/2018 2306   LEUKOCYTESUR Negative 10/23/2012 1807   Urine Drug Screen     Component Value Date/Time   LABOPIA NONE DETECTED 07/05/2020 1221   COCAINSCRNUR NONE DETECTED 07/05/2020 1221   LABBENZ NONE DETECTED 07/05/2020 1221   AMPHETMU NONE DETECTED 07/05/2020 1221   THCU NONE DETECTED 07/05/2020 1221   LABBARB NONE DETECTED 07/05/2020 1221    Alcohol Level    Component Value Date/Time   ETH <10 02/19/2020 2053   SIGNIFICANT DIAGNOSTIC STUDIES CT Angio Head W or Wo Contrast  Result Date: 07/03/2020 CLINICAL DATA:  Initial evaluation for possible  stroke, neuro deficit, right-sided weakness, slurred speech. EXAM: CT ANGIOGRAPHY HEAD AND NECK TECHNIQUE: Multidetector CT imaging of the head and neck was performed using the standard protocol during bolus administration of intravenous contrast. Multiplanar CT image reconstructions and MIPs were obtained to evaluate the vascular anatomy. Carotid stenosis measurements (when applicable) are obtained utilizing NASCET criteria, using the distal internal carotid diameter as the denominator. CONTRAST:  62mL OMNIPAQUE IOHEXOL 350 MG/ML SOLN COMPARISON:  Prior  CT from earlier the same day. FINDINGS: CTA NECK FINDINGS Aortic arch: Visualized aortic arch of normal caliber with normal branch pattern. No hemodynamically significant stenosis seen about the origin of the great vessels. Right carotid system: Right common carotid artery widely patent from its origin to the bifurcation. Minimal centric calcified plaque at the right bifurcation without stenosis. Right ICA widely patent distally without stenosis, dissection or occlusion. Left carotid system: Left common and internal carotid arteries widely patent without stenosis, dissection or occlusion. No significant atheromatous narrowing about the left bifurcation. Vertebral arteries: Both vertebral arteries arise from subclavian arteries. No proximal subclavian artery stenosis. Vertebral arteries widely patent without stenosis, dissection or occlusion. Skeleton: No acute osseous abnormality. No discrete or worrisome osseous lesions. Poor dentition noted. Other neck: No other acute soft tissue abnormality within the neck. No mass or adenopathy. Upper chest: Visualized upper chest demonstrates no acute finding. Scattered atelectatic changes noted within the visualized lungs. Review of the MIP images confirms the above findings CTA HEAD FINDINGS Anterior circulation: Petrous segments widely patent. Mild atheromatous change within the carotid siphons without hemodynamically  significant stenosis. A1 segments widely patent. Normal anterior communicating artery complex. Anterior cerebral arteries patent to their distal aspects without proximal high-grade stenosis. No M1 stenosis or occlusion. Normal MCA bifurcations. No proximal MCA branch occlusion. MCA branches well perfused and symmetric bilaterally. Mild to moderate small vessel atheromatous irregularity, advanced for age. Posterior circulation: Both V4 segments widely patent to the vertebrobasilar junction. Both PICA origins patent and normal. Basilar widely patent to its distal aspect without stenosis. Superior cerebral arteries patent bilaterally. Both PCAs primarily supplied via the basilar. Multifocal moderate to severe stenoses noted throughout the left P1 and P2 segments. Short-segment severe right P1 stenosis noted (series 606, image 94). Both PCAs remain perfused to their distal aspects. Venous sinuses: Patent. Anatomic variants: None significant.  No intracranial aneurysm. Review of the MIP images confirms the above findings IMPRESSION: 1. Negative CTA for emergent large vessel occlusion. 2. 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 3. Distal small vessel atheromatous irregularity about the intracranial circulation, advanced for age. 4. Wide patency of the major arterial vasculature of the neck. Electronically Signed   By: Jeannine Boga M.D.   On: 07/03/2020 19:19   MR BRAIN WO CONTRAST  Result Date: 07/04/2020 CLINICAL DATA:  Stroke, follow-up. EXAM: MRI HEAD WITHOUT CONTRAST TECHNIQUE: Multiplanar, multiecho pulse sequences of the brain and surrounding structures were obtained without intravenous contrast. COMPARISON:  Noncontrast head CT 07/03/2020. CT angiogram head/neck 07/03/2020. FINDINGS: Brain: Cerebral volume is normal for age. There is a 2.5 x 1.4 cm acute infarct within the left corona radiata, thalamocapsular junction and lentiform nucleus. Minimal multifocal  T2/FLAIR hyperintensity within the cerebral white matter elsewhere and pons, nonspecific but most often secondary to chronic small vessel ischemia. No evidence of intracranial mass. No chronic intracranial blood products. No extra-axial fluid collection. No midline shift. Vascular: Expected proximal arterial flow voids. Skull and upper cervical spine: No focal marrow lesion. Sinuses/Orbits: Visualized orbits show no acute finding. Trace ethmoid sinus mucosal thickening. Mild mucosal thickening within the right maxillary sinus. Other: Trace fluid within the bilateral mastoid air cells. IMPRESSION: 2.5 x 1.4 cm acute infarct within the left corona radiata, thalamocapsular junction and lentiform nucleus. No mass effect or evidence of hemorrhagic conversion. Background mild chronic small vessel ischemic disease. Electronically Signed   By: Kellie Simmering DO   On: 07/04/2020 16:34   DG Shoulder  Right Port  Result Date: 07/04/2020 CLINICAL DATA:  Right shoulder pain.  No known injury. EXAM: PORTABLE RIGHT SHOULDER COMPARISON:  None. FINDINGS: There is no evidence of fracture or dislocation. There is no evidence of arthropathy or other focal bone abnormality. Soft tissues are unremarkable. IMPRESSION: Negative exam Electronically Signed   By: Inge Rise M.D.   On: 07/04/2020 15:39   ECHOCARDIOGRAM COMPLETE  Result Date: 07/04/2020    ECHOCARDIOGRAM REPORT   Patient Name:   Kristy Mcmillan Date of Exam: 07/04/2020 Medical Rec #:  [id number]       Height:       64.0 in Accession #:    [id number]      Weight:       146.8 lb Date of Birth:  September 20, 1975       BSA:          1.716 m Patient Age:    45 years        BP:           130/85 mmHg Patient Gender: F               HR:           98 bpm. Exam Location:  Inpatient Procedure: 2D Echo Indications:    Stroke 434.91 / I163.9  History:        Patient has no prior history of Echocardiogram examinations.                 Stroke; Risk Factors:Current Smoker and Diabetes. GERD.   Sonographer:    Leavy Cella Referring Phys: [phone] MCNEILL P [place]  1. Left ventricular ejection fraction, by estimation, is 60 to 65%. The left ventricle has normal function. 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 There is mild concentric left ventricular hypertrophy and moderate basal septal hypertrophy.  2. Right ventricular systolic function was not well visualized. The right ventricular size is normal.  3. The mitral valve is normal in structure. No evidence of mitral valve regurgitation. No evidence of mitral stenosis. Moderate mitral annular calcification.  4. The aortic valve was not well visualized. Aortic valve regurgitation is not visualized. No aortic stenosis is present.  5. The inferior vena cava is normal in size with greater than 50% respiratory variability, suggesting right atrial pressure of 3 mmHg. FINDINGS  Left Ventricle: Left ventricular ejection fraction, by estimation, is 55 to 60%. The left ventricle has normal function. The left ventricle demonstrates regional wall motion abnormalities. The left ventricular internal cavity size was normal in size. There is mild concentric left ventricular hypertrophy. Left ventricular diastolic parameters are indeterminate. Elevated left ventricular end-diastolic pressure. Right Ventricle: The right ventricular size is normal. No increase in right ventricular wall thickness. Right ventricular systolic function was not well visualized. Left Atrium: Left atrial size was normal in size. Right Atrium: Right atrial size was normal in size. Pericardium: There is no evidence of pericardial effusion. Mitral Valve: The mitral valve is normal in structure. Moderate mitral annular calcification. No evidence of mitral valve regurgitation. No evidence of mitral valve stenosis. Tricuspid Valve: The tricuspid valve is normal in structure. Tricuspid valve regurgitation is not demonstrated. No evidence of tricuspid  stenosis. Aortic Valve: The aortic valve was not well visualized. Aortic valve regurgitation is not visualized. No aortic stenosis is present. Pulmonic Valve: The pulmonic valve was normal in structure. Pulmonic valve regurgitation is not visualized. No evidence of pulmonic stenosis. Aorta: The aortic root is normal  in size and structure. Venous: The inferior vena cava is normal in size with greater than 50% respiratory variability, suggesting right atrial pressure of 3 mmHg. IAS/Shunts: No atrial level shunt detected by color flow Doppler.  LEFT VENTRICLE PLAX 2D LVIDd:         3.60 cm  Diastology LVIDs:         2.90 cm  LV e' medial:    4.57 cm/s LV PW:         1.60 cm  LV E/e' medial:  19.7 LV IVS:        1.40 cm  LV e' lateral:   7.29 cm/s LVOT diam:     1.90 cm  LV E/e' lateral: 12.3 LVOT Area:     2.84 cm  RIGHT VENTRICLE RV S prime:     10.80 cm/s TAPSE (M-mode): 1.4 cm LEFT ATRIUM             Index       RIGHT ATRIUM          Index LA diam:        3.40 cm 1.98 cm/m  RA Area:     8.84 cm LA Vol (A2C):   24.5 ml 14.28 ml/m RA Volume:   18.40 ml 10.73 ml/m LA Vol (A4C):   24.1 ml 14.05 ml/m LA Biplane Vol: 25.0 ml 14.57 ml/m   AORTA Ao Root diam: 2.20 cm MITRAL VALVE MV Area (PHT): 3.23 cm    SHUNTS MV Decel Time: 235 msec    Systemic Diam: 1.90 cm MV E velocity: 90.00 cm/s MV A velocity: 88.70 cm/s MV E/A ratio:  1.01 Fransico Him MD Electronically signed by Fransico Him MD Signature Date/Time: 07/04/2020/10:45:15 AM    Final    ECHO TEE  Result Date: 07/09/2020    TRANSESOPHOGEAL ECHO REPORT   Patient Name:   Kristy Mcmillan Date of Exam: 07/09/2020 Medical Rec #:  [id number]       Height:       64.0 in Accession #:    [id number]      Weight:       146.8 lb Date of Birth:  1976/04/05       BSA:          1.716 m Patient Age:    60 years        BP:           103/54 mmHg Patient Gender: F               HR:           87 bpm. Exam Location:  Inpatient Procedure: Transesophageal Echo, Cardiac Doppler and  Color Doppler Indications:     CVA  History:         Patient has prior history of Echocardiogram examinations.                  Stroke; Risk Factors:Current Smoker, Diabetes and Dyslipidemia.                  MRSA, seizure disorder.  Sonographer:     Dustin Flock Referring Phys:  [id number] Darreld Mclean Diagnosing Phys: Gwyndolyn Kaufman MD PROCEDURE: The transesophogeal probe was passed without difficulty through the esophogus of the patient. Local oropharyngeal anesthetic was provided with viscous lidocaine. Sedation performed by different physician. The patient was monitored while under deep sedation. Anesthestetic sedation was provided intravenously by Anesthesiology: 140mg  of Propofol, 40mg  of Lidocaine. The patient developed no complications  during the procedure. IMPRESSIONS  1. Left ventricular ejection fraction, by estimation, is 60 to 65%. The left ventricle has normal function. The basal inferoseptal LV segment appears aneurysmal. All other LV segments contract normally.  2. Right ventricular systolic function is normal. The right ventricular size is normal.  3. No left atrial/left atrial appendage thrombus was detected.  4. The mitral valve is normal in structure. Trivial mitral valve regurgitation. No evidence of mitral stenosis.  5. The aortic valve is tricuspid. Aortic valve regurgitation is not visualized. No aortic stenosis is present.  6. Agitated saline contrast bubble study was negative, with no evidence of any interatrial shunt. FINDINGS  Left Ventricle: Left ventricular ejection fraction, by estimation, is 60 to 65%. The left ventricle has normal function. The left ventricle demonstrates regional wall motion abnormalities. The left ventricular internal cavity size was normal in size.  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 Right Ventricle: The right ventricular size is normal. No increase in right ventricular wall thickness. Right ventricular systolic function is  normal. Left Atrium: Left atrial size was normal in size. No left atrial/left atrial appendage thrombus was detected. Right Atrium: Right atrial size was normal in size. Pericardium: There is no evidence of pericardial effusion. Mitral Valve: The mitral valve is normal in structure. There is mild thickening of the mitral valve leaflet(s). Trivial mitral valve regurgitation. No evidence of mitral valve stenosis. Tricuspid Valve: The tricuspid valve is normal in structure. Tricuspid valve regurgitation is trivial. Aortic Valve: The aortic valve is tricuspid. Aortic valve regurgitation is not visualized. No aortic stenosis is present. Pulmonic Valve: The pulmonic valve was normal in structure. Pulmonic valve regurgitation is trivial. Aorta: The aortic root and ascending aorta are structurally normal, with no evidence of dilitation. There is minimal (Grade I) plaque. IAS/Shunts: No atrial level shunt detected by color flow Doppler. Agitated saline contrast was given intravenously to evaluate for intracardiac shunting. Agitated saline contrast bubble study was negative, with no evidence of any interatrial shunt. Gwyndolyn Kaufman MD Electronically signed by Gwyndolyn Kaufman MD Signature Date/Time: 07/09/2020/9:41:41 AM    Final    CT HEAD CODE STROKE WO CONTRAST  Result Date: 07/03/2020 CLINICAL DATA:  Code stroke.  Difficulty moving right arm EXAM: CT HEAD WITHOUT CONTRAST TECHNIQUE: Contiguous axial images were obtained from the base of the skull through the vertex without intravenous contrast. COMPARISON:  March 12, 2020 FINDINGS: Brain: There is no acute intracranial hemorrhage, mass effect, or edema. Gray-white differentiation is preserved. There is a small chronic infarct of the medial left thalamus. Ventricles and sulci are normal in size and configuration. There is no extra-axial collection. Vascular: No hyperdense vessel. Mild intracranial atherosclerotic calcification is present at the skull base. Skull:  Unremarkable. Sinuses/Orbits: Mild deformity of the medial right orbital wall may reflect prior trauma. Other: Minimal mastoid opacification. ASPECTS ([place] Stroke Program Early CT Score) - Ganglionic level infarction (caudate, lentiform nuclei, internal capsule, insula, M1-M3 cortex): 7 - Supraganglionic infarction (M4-M6 cortex): 3 Total score (0-10 with 10 being normal): 10 IMPRESSION: There is no acute intracranial hemorrhage or evidence of acute infarction. ASPECT score is 10. Small chronic left thalamic infarct. These results were called by telephone at the time of interpretation on 07/03/2020 at 6:15 pm to provider PHILLIP STAFFORD , who verbally acknowledged these results. Electronically Signed   By: Macy Mis M.D.   On: 07/03/2020 18:17   CT ANGIO NECK CODE STROKE  Result Date: 07/03/2020 CLINICAL DATA:  Initial evaluation for  possible stroke, neuro deficit, right-sided weakness, slurred speech. EXAM: CT ANGIOGRAPHY HEAD AND NECK TECHNIQUE: Multidetector CT imaging of the head and neck was performed using the standard protocol during bolus administration of intravenous contrast. Multiplanar CT image reconstructions and MIPs were obtained to evaluate the vascular anatomy. Carotid stenosis measurements (when applicable) are obtained utilizing NASCET criteria, using the distal internal carotid diameter as the denominator. CONTRAST:  75mL OMNIPAQUE IOHEXOL 350 MG/ML SOLN COMPARISON:  Prior CT from earlier the same day. FINDINGS: CTA NECK FINDINGS Aortic arch: Visualized aortic arch of normal caliber with normal branch pattern. No hemodynamically significant stenosis seen about the origin of the great vessels. Right carotid system: Right common carotid artery widely patent from its origin to the bifurcation. Minimal centric calcified plaque at the right bifurcation without stenosis. Right ICA widely patent distally without stenosis, dissection or occlusion. Left carotid system: Left common and internal  carotid arteries widely patent without stenosis, dissection or occlusion. No significant atheromatous narrowing about the left bifurcation. Vertebral arteries: Both vertebral arteries arise from subclavian arteries. No proximal subclavian artery stenosis. Vertebral arteries widely patent without stenosis, dissection or occlusion. Skeleton: No acute osseous abnormality. No discrete or worrisome osseous lesions. Poor dentition noted. Other neck: No other acute soft tissue abnormality within the neck. No mass or adenopathy. Upper chest: Visualized upper chest demonstrates no acute finding. Scattered atelectatic changes noted within the visualized lungs. Review of the MIP images confirms the above findings CTA HEAD FINDINGS Anterior circulation: Petrous segments widely patent. Mild atheromatous change within the carotid siphons without hemodynamically significant stenosis. A1 segments widely patent. Normal anterior communicating artery complex. Anterior cerebral arteries patent to their distal aspects without proximal high-grade stenosis. No M1 stenosis or occlusion. Normal MCA bifurcations. No proximal MCA branch occlusion. MCA branches well perfused and symmetric bilaterally. Mild to moderate small vessel atheromatous irregularity, advanced for age. Posterior circulation: Both V4 segments widely patent to the vertebrobasilar junction. Both PICA origins patent and normal. Basilar widely patent to its distal aspect without stenosis. Superior cerebral arteries patent bilaterally. Both PCAs primarily supplied via the basilar. Multifocal moderate to severe stenoses noted throughout the left P1 and P2 segments. Short-segment severe right P1 stenosis noted (series 606, image 94). Both PCAs remain perfused to their distal aspects. Venous sinuses: Patent. Anatomic variants: None significant.  No intracranial aneurysm. Review of the MIP images confirms the above findings IMPRESSION: 1. Negative CTA for emergent large vessel  occlusion. 2. 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 3. Distal small vessel atheromatous irregularity about the intracranial circulation, advanced for age. 4. Wide patency of the major arterial vasculature of the neck. Electronically Signed   By: Jeannine Boga M.D.   On: 07/03/2020 19:19   HISTORY OF PRESENT ILLNESS Per Dr. 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 She stopped taking her Plavix and ASA one week PTA because she thought it was causing right shoulder pain.    HOSPITAL COURSE  Kristy Mcmillan was evaluated as a Code Stroke in the ED. TPA was administered at [place] on 1/4. Stroke work up included CT head no acute abnormality and small  chronic left thalamic infarct. CTA head/neck was negative for large vessel occlusion. MR Brain showed 2.5 x 1.4 cm acute infarct within the left corona radiata,thalamocapsular junction and lentiform nucleus. 2D echo showed EF 60-65%. TEE showed EF 60-65% with negative bubble study. Patient and family declined to stay for loop monitor placement.  She was initially admitted to the neurologic ICU for intensive monitoring. She remained hemodynamically and neurologically stable. She was transferred to the floor where she progressed well. DAPT was provided during her stay and should continue due to history of PAD. Education regarding the importance of this therapy was provided. Her right sided weakness persisted. Physical, speech and occupational therapists were consulted. Therapy teams provided evaluation, treatment and discharge planning recommendations.  Inpatient rehabilitation was recommended. Patient declined this recommendation due to financial concerns. She and her family elected to go home with home based therapy services as a back up plan. These services were arranged by the care management team. She left abruptly after TEE due to family's work needs.   Other problems are presented in a problem based format below:   Hyperlipidemia  Home meds: atorvastatin 40 mg daily but likely noncompliant  LDL 97, goal < 70  On lipitor 40  Continue statin at discharge  Diabetes type II Uncontrolled  Home meds: Levemir 10u at HS  HgbA1c 8.1, goal < 7.0  CBGs  SSI  Needs better DM control  Follow up with PCP closely  Seizure disorder  On home zonisamide   Tobacco abuse  Current smoker  Smoking cessation counseling provided  Pt is willing to quit  DISCHARGE EXAM Blood pressure 120/84, pulse 100, temperature 97.8 F (36.6 C), temperature source Oral, resp. rate 20, SpO2 100 %, unknown if currently breastfeeding.  Pleasant middle-age Caucasian lady lying comfortably in bed. Not in distress. Afebrile. Head is nontraumatic. Neck is supple without bruit. Cardiac exam no murmur or gallop. Lungs are clear to auscultation. Distal pulses are well felt.  Neurological Exam : Alert and oriented x3 no aphasia with only minimal dysarthria. Extraocular movements are full range without nystagmus. Blinks to threat bilaterally. Right lower facial weakness. Tongue midline. Motor system exam shows right hemiparesis with RUE 3/5 distally and proximally and RLE  2/5 strength. LUE and LLE 5/5. Sensation is intact. Coordination intact on the left. Gait not tested.  Discharge Diet       There are no active orders of the following types: Diet, Nourishments.   liquids  DISCHARGE PLAN  Disposition:  Home with home health therapy services   Aspirin 81mg  and Plavix 75 mg daily indefinitely due to PAD   Ongoing stroke risk factor control by  Primary Care Physician at time of discharge  Follow-up PCP Center, [facility] in 2 weeks.  Follow-up in [facility] in 4 weeks, office to schedule an appointment.   45 minutes were spent preparing discharge.  Rosalin Hawking, MD PhD Stroke Neurology 07/09/2020 5:16 PM

## 2020-07-09 NOTE — Progress Notes (Addendum)
Patient was very anxious this morning following her return from TEE procedure, requesting to leave the hospital as soon as possible. Husband came to bedside to get the patient and was unwilling to wait for discharge instructions, took the patient down to the main entrance via wheelchair. RN escorted the patient back to the unit and reviewed the discharge instructions, patient left with husband via personal vehicle and AVS paperwork.

## 2020-07-10 NOTE — Anesthesia Postprocedure Evaluation (Signed)
Anesthesia Post Note  Patient: Kristy Mcmillan  Procedure(s) Performed: TRANSESOPHAGEAL ECHOCARDIOGRAM (TEE) (N/A ) BUBBLE STUDY     Patient location during evaluation: Endoscopy Anesthesia Type: MAC Level of consciousness: awake and alert Pain management: pain level controlled Vital Signs Assessment: post-procedure vital signs reviewed and stable Respiratory status: spontaneous breathing, nonlabored ventilation, respiratory function stable and patient connected to nasal cannula oxygen Cardiovascular status: stable and blood pressure returned to baseline Postop Assessment: no apparent nausea or vomiting Anesthetic complications: no   No complications documented.  Last Vitals:  Vitals:   07/09/20 0824 07/09/20 1000  BP: 121/84 120/84  Pulse: 96 100  Resp: 19 20  Temp:  36.6 C  SpO2: 100% 100%    Last Pain:  Vitals:   07/09/20 1000  TempSrc: Oral  PainSc: 0-No pain                 Audon Heymann S

## 2020-10-10 ENCOUNTER — Other Ambulatory Visit: Payer: Self-pay

## 2020-10-10 NOTE — Patient Outreach (Signed)
[name]) Care Management  10/10/2020  Kristy Mcmillan 08-20-1975 [id number]   First telephone outreach attempt to obtain mRS. No answer. Left message for returned call.  Philmore Pali [facility] Management Assistant [phone]

## 2020-10-16 ENCOUNTER — Other Ambulatory Visit: Payer: Self-pay

## 2020-10-16 NOTE — Patient Outreach (Signed)
[facility]) Care Management  10/16/2020  DANETTE WEINFELD 09-11-1975 [id number]   Second telephone outreach attempt to obtain mRS. No answer. Left message for returned call.  Philmore Pali [facility] Management Assistant [phone]

## 2020-10-22 ENCOUNTER — Other Ambulatory Visit: Payer: Self-pay

## 2020-10-22 NOTE — Patient Outreach (Signed)
[facility]) Care Management  10/22/2020  Kristy Mcmillan 04/30/1976 [id number]   3 outreach attempts were completed to obtain mRs. mRs could not be obtained because patient never returned my calls. mRs=7    [place] Management Assistant [phone]

## 2020-11-14 ENCOUNTER — Telehealth: Payer: Self-pay

## 2020-11-14 NOTE — Telephone Encounter (Signed)
Patient called requesting an antibiotic and pain medication for her foot. Patient advised that Dr. Delaine Lame is currently out of the office and patient will need to reach out to her podiatry or her pcp in regards to her pain and needing an antibiotic. Patient also advised she can go to the ED for the pain. Per podiatry note patient is due for a follow up with their office and they are unable to prescribe any medications for her at this time. Patient has been advised of this as well.  Kristy Mcmillan T Brooks Sailors

## 2020-12-03 ENCOUNTER — Telehealth: Payer: Self-pay

## 2020-12-03 NOTE — Telephone Encounter (Signed)
Patient called and left voicemail requesting antibiotic medication and pain medication for foot.  Offered appointment with [facility] ID. Patient declined appointment at this time due to transportation issues. States she follows up with Podiatry on 12/14/20 and will reach out to PCP for pain medication. Patient also offered a virtual visit if MD agrees, patient can be reached by [phone]. Routing to [facility] for follow up. [name]

## 2020-12-04 ENCOUNTER — Telehealth: Payer: Self-pay

## 2020-12-04 ENCOUNTER — Other Ambulatory Visit: Payer: Self-pay

## 2020-12-04 ENCOUNTER — Ambulatory Visit: Payer: Medicare Other | Attending: Infectious Diseases | Admitting: Infectious Diseases

## 2020-12-04 ENCOUNTER — Encounter: Payer: Self-pay | Admitting: Infectious Diseases

## 2020-12-04 DIAGNOSIS — E11628 Type 2 diabetes mellitus with other skin complications: Secondary | ICD-10-CM | POA: Diagnosis not present

## 2020-12-04 DIAGNOSIS — L089 Local infection of the skin and subcutaneous tissue, unspecified: Secondary | ICD-10-CM

## 2020-12-04 NOTE — Telephone Encounter (Signed)
Advised appt 12/14/2020 with [facility]iat[facility] 11:15 am. Also advised Roselyn Reef at [facility] states she MUST show or dismissed due to multiple no shows. She was instructed to call transportation so she has given them notice. Dr Delaine Lame will come to this visit with Podiatry and see what is recommended and see Left foot in person. Advised if she becomes sick with fever, heat to site, or swelling she can go to UC/ED.

## 2020-12-04 NOTE — Progress Notes (Signed)
The purpose of this virtual visit is to provide medical care while limiting exposure to the novel coronavirus (COVID19) for both patient and office staff.   Consent was obtained for phone visit:  Yes.   Answered questions that patient had about telehealth interaction:  Yes.   I discussed the limitations, risks, security and privacy concerns of performing an evaluation and management service by telephone. I also discussed with the patient that there may be a patient responsible charge related to this service. The patient expressed understanding and agreed to proceed.   Patient Location: Home Provider Location:[place] Patient and provider along with Theresia Majors CMA and Patient's husband was in the call initially when he set up the call for her and help show the foot on the screen  Patient with history of diabetes mellitus not well controlled, seizure disorder, CVA, MRSA infection of the foot, peripheral arterial disease, smoker  C/o swelling and pain both feet Thinks she has an infection  Pt last seen in my office OCT 2021 She had MRSA infection of the right foot and was in the hospital between 03/09/2020 until 03/17/2020 and underwent abscess drainage and fifth toe amputation with partial ray excision by Dr. Caryl Comes.  She also had a angio on 03/13/2020 and underwent PTA of the right tibioperoneal trunk.  She was sent home on linezolid p.o. for 3 weeks.  But after taking 6 pills she did not go back to collect further medication and had stopped taking the medications until 04/04/2020 when she was asked to fill the prescription and take it.  Patient has been noncompliant with her visits.  She has seen Dr. Cleda Mccreedy on 04/10/2020.  In January 2022 she was hospitalized for right CVA as she was not taking Plavix. Patient has intolerance to many medications most of which are GI symptoms.  But she blamed the stroke on antibiotics and Plavix. Patient now complains of pain and swelling in the feet.  No fever She thinks  she is expressing some pus.  She has not seen any providers because of lack of transportation.  She has Medicaid and asked to call for transportation 3 days before the appointment. I the visualization of the foot through the the monitor was inadequate. But I was able to see that the previous site of infection had healed.  There was scarring.        The 2 sites which she pointed out was a new infection I was not able to visualize that very well. A small wound was seen on the medial malleolus of the rt foot . But there was no erythema      I told her that she had to come to see the podiatrist and I would be able to see her during that visit with him.  We need to assess the foot and l ankle take cultures before prescribing antibiotic.  She is intolerant to many antibiotics and hence would need to target the bacteria with the right antibiotic. Patient understood the reason why she has to come to be assessed. She also understands that if she develops fever or increasing swelling or worsening discharge she has to go to the emergency department.  Total time spent on this call was 22 minutes.

## 2020-12-04 NOTE — Telephone Encounter (Signed)
Left VM to call us if she wants to do a virtual visit.

## 2020-12-12 ENCOUNTER — Encounter: Payer: Self-pay | Admitting: Intensive Care

## 2020-12-12 ENCOUNTER — Other Ambulatory Visit: Payer: Self-pay

## 2020-12-12 ENCOUNTER — Emergency Department: Payer: Medicare Other

## 2020-12-12 ENCOUNTER — Inpatient Hospital Stay
Admission: EM | Admit: 2020-12-12 | Discharge: 2020-12-16 | DRG: 571 | Disposition: A | Discharge status: Home Health | Payer: Medicare Other | Attending: Internal Medicine | Admitting: Internal Medicine

## 2020-12-12 DIAGNOSIS — E10622 Type 1 diabetes mellitus with other skin ulcer: Secondary | ICD-10-CM | POA: Diagnosis present

## 2020-12-12 DIAGNOSIS — I69351 Hemiplegia and hemiparesis following cerebral infarction affecting right dominant side: Secondary | ICD-10-CM

## 2020-12-12 DIAGNOSIS — Z833 Family history of diabetes mellitus: Secondary | ICD-10-CM

## 2020-12-12 DIAGNOSIS — E1042 Type 1 diabetes mellitus with diabetic polyneuropathy: Secondary | ICD-10-CM | POA: Diagnosis present

## 2020-12-12 DIAGNOSIS — Z881 Allergy status to other antibiotic agents status: Secondary | ICD-10-CM

## 2020-12-12 DIAGNOSIS — M86162 Other acute osteomyelitis, left tibia and fibula: Secondary | ICD-10-CM

## 2020-12-12 DIAGNOSIS — M25572 Pain in left ankle and joints of left foot: Secondary | ICD-10-CM | POA: Diagnosis not present

## 2020-12-12 DIAGNOSIS — N390 Urinary tract infection, site not specified: Secondary | ICD-10-CM | POA: Diagnosis present

## 2020-12-12 DIAGNOSIS — G40909 Epilepsy, unspecified, not intractable, without status epilepticus: Secondary | ICD-10-CM

## 2020-12-12 DIAGNOSIS — S86112A Strain of other muscle(s) and tendon(s) of posterior muscle group at lower leg level, left leg, initial encounter: Secondary | ICD-10-CM | POA: Diagnosis present

## 2020-12-12 DIAGNOSIS — Z98891 History of uterine scar from previous surgery: Secondary | ICD-10-CM

## 2020-12-12 DIAGNOSIS — L03116 Cellulitis of left lower limb: Secondary | ICD-10-CM | POA: Diagnosis not present

## 2020-12-12 DIAGNOSIS — E1340 Other specified diabetes mellitus with diabetic neuropathy, unspecified: Secondary | ICD-10-CM | POA: Diagnosis present

## 2020-12-12 DIAGNOSIS — Z89431 Acquired absence of right foot: Secondary | ICD-10-CM

## 2020-12-12 DIAGNOSIS — Z9119 Patient's noncompliance with other medical treatment and regimen: Secondary | ICD-10-CM

## 2020-12-12 DIAGNOSIS — L02612 Cutaneous abscess of left foot: Secondary | ICD-10-CM | POA: Diagnosis present

## 2020-12-12 DIAGNOSIS — Z8614 Personal history of Methicillin resistant Staphylococcus aureus infection: Secondary | ICD-10-CM

## 2020-12-12 DIAGNOSIS — Z20822 Contact with and (suspected) exposure to covid-19: Secondary | ICD-10-CM | POA: Diagnosis present

## 2020-12-12 DIAGNOSIS — K219 Gastro-esophageal reflux disease without esophagitis: Secondary | ICD-10-CM | POA: Diagnosis present

## 2020-12-12 DIAGNOSIS — I639 Cerebral infarction, unspecified: Secondary | ICD-10-CM | POA: Diagnosis present

## 2020-12-12 DIAGNOSIS — M65172 Other infective (teno)synovitis, left ankle and foot: Secondary | ICD-10-CM | POA: Diagnosis present

## 2020-12-12 DIAGNOSIS — E1065 Type 1 diabetes mellitus with hyperglycemia: Secondary | ICD-10-CM | POA: Diagnosis present

## 2020-12-12 DIAGNOSIS — F32A Depression, unspecified: Secondary | ICD-10-CM | POA: Diagnosis present

## 2020-12-12 DIAGNOSIS — N183 Chronic kidney disease, stage 3 unspecified: Secondary | ICD-10-CM | POA: Diagnosis present

## 2020-12-12 DIAGNOSIS — X58XXXA Exposure to other specified factors, initial encounter: Secondary | ICD-10-CM | POA: Diagnosis present

## 2020-12-12 DIAGNOSIS — Z9102 Food additives allergy status: Secondary | ICD-10-CM

## 2020-12-12 DIAGNOSIS — Z91410 Personal history of adult physical and sexual abuse: Secondary | ICD-10-CM

## 2020-12-12 DIAGNOSIS — Z880 Allergy status to penicillin: Secondary | ICD-10-CM

## 2020-12-12 DIAGNOSIS — F4325 Adjustment disorder with mixed disturbance of emotions and conduct: Secondary | ICD-10-CM | POA: Diagnosis present

## 2020-12-12 DIAGNOSIS — G894 Chronic pain syndrome: Secondary | ICD-10-CM | POA: Diagnosis present

## 2020-12-12 DIAGNOSIS — F17210 Nicotine dependence, cigarettes, uncomplicated: Secondary | ICD-10-CM | POA: Diagnosis present

## 2020-12-12 DIAGNOSIS — Z7982 Long term (current) use of aspirin: Secondary | ICD-10-CM

## 2020-12-12 DIAGNOSIS — Z888 Allergy status to other drugs, medicaments and biological substances status: Secondary | ICD-10-CM

## 2020-12-12 DIAGNOSIS — Z7984 Long term (current) use of oral hypoglycemic drugs: Secondary | ICD-10-CM

## 2020-12-12 DIAGNOSIS — Z794 Long term (current) use of insulin: Secondary | ICD-10-CM

## 2020-12-12 DIAGNOSIS — Z79899 Other long term (current) drug therapy: Secondary | ICD-10-CM

## 2020-12-12 DIAGNOSIS — E1022 Type 1 diabetes mellitus with diabetic chronic kidney disease: Secondary | ICD-10-CM | POA: Diagnosis present

## 2020-12-12 DIAGNOSIS — L97329 Non-pressure chronic ulcer of left ankle with unspecified severity: Secondary | ICD-10-CM | POA: Diagnosis present

## 2020-12-12 DIAGNOSIS — E1051 Type 1 diabetes mellitus with diabetic peripheral angiopathy without gangrene: Secondary | ICD-10-CM | POA: Diagnosis present

## 2020-12-12 LAB — URINALYSIS, COMPLETE (UACMP) WITH MICROSCOPIC
Bilirubin Urine: NEGATIVE
Glucose, UA: 50 mg/dL — AB
Hgb urine dipstick: NEGATIVE
Ketones, ur: 5 mg/dL — AB
Nitrite: NEGATIVE
Protein, ur: >=300 mg/dL — AB
Specific Gravity, Urine: 1.026 (ref 1.005–1.030)
pH: 5 (ref 5.0–8.0)

## 2020-12-12 LAB — COMPREHENSIVE METABOLIC PANEL
ALT: 12 U/L (ref 0–44)
AST: 18 U/L (ref 15–41)
Albumin: 3.9 g/dL (ref 3.5–5.0)
Alkaline Phosphatase: 59 U/L (ref 38–126)
Anion gap: 9 (ref 5–15)
BUN: 26 mg/dL — ABNORMAL HIGH (ref 6–20)
CO2: 25 mmol/L (ref 22–32)
Calcium: 9.8 mg/dL (ref 8.9–10.3)
Chloride: 106 mmol/L (ref 98–111)
Creatinine, Ser: 1.46 mg/dL — ABNORMAL HIGH (ref 0.44–1.00)
GFR, Estimated: 45 mL/min — ABNORMAL LOW (ref >60)
Glucose, Bld: 283 mg/dL — ABNORMAL HIGH (ref 70–99)
Potassium: 4.6 mmol/L (ref 3.5–5.1)
Sodium: 140 mmol/L (ref 135–145)
Total Bilirubin: 0.7 mg/dL (ref 0.3–1.2)
Total Protein: 8 g/dL (ref 6.5–8.1)

## 2020-12-12 LAB — CBC WITH DIFFERENTIAL/PLATELET
Abs Immature Granulocytes: 0.03 10*3/uL (ref 0.00–0.07)
Basophils Absolute: 0.1 10*3/uL (ref 0.0–0.1)
Basophils Relative: 1 %
Eosinophils Absolute: 0.1 10*3/uL (ref 0.0–0.5)
Eosinophils Relative: 1 %
HCT: 39.8 % (ref 36.0–46.0)
Hemoglobin: 13.2 g/dL (ref 12.0–15.0)
Immature Granulocytes: 0 %
Lymphocytes Relative: 25 %
Lymphs Abs: 3.1 10*3/uL (ref 0.7–4.0)
MCH: 26.6 pg (ref 26.0–34.0)
MCHC: 33.2 g/dL (ref 30.0–36.0)
MCV: 80.1 fL (ref 80.0–100.0)
Monocytes Absolute: 0.5 10*3/uL (ref 0.1–1.0)
Monocytes Relative: 4 %
Neutro Abs: 8.5 10*3/uL — ABNORMAL HIGH (ref 1.7–7.7)
Neutrophils Relative %: 69 %
Platelets: 506 10*3/uL — ABNORMAL HIGH (ref 150–400)
RBC: 4.97 MIL/uL (ref 3.87–5.11)
RDW: 14.7 % (ref 11.5–15.5)
WBC: 12.3 10*3/uL — ABNORMAL HIGH (ref 4.0–10.5)
nRBC: 0 % (ref 0.0–0.2)

## 2020-12-12 LAB — CBG MONITORING, ED: Glucose-Capillary: 294 mg/dL — ABNORMAL HIGH (ref 70–99)

## 2020-12-12 LAB — LACTIC ACID, PLASMA: Lactic Acid, Venous: 1.5 mmol/L (ref 0.5–1.9)

## 2020-12-12 MED ORDER — FERROUS SULFATE 325 (65 FE) MG PO TABS
325.0000 mg | ORAL_TABLET | Freq: Every day | ORAL | Status: DC
  Administered 2020-12-13 – 2020-12-16 (×4): 325 mg via ORAL
  Filled 2020-12-12 (×4): qty 1

## 2020-12-12 MED ORDER — SODIUM CHLORIDE 0.9 % IV SOLN: INTRAVENOUS | Status: AC

## 2020-12-12 MED ORDER — DULOXETINE HCL 30 MG PO CPEP
30.0000 mg | ORAL_CAPSULE | Freq: Two times a day (BID) | ORAL | Status: DC
  Administered 2020-12-13 – 2020-12-16 (×4): 30 mg via ORAL
  Filled 2020-12-12 (×9): qty 1

## 2020-12-12 MED ORDER — NORTRIPTYLINE HCL 10 MG PO CAPS
40.0000 mg | ORAL_CAPSULE | Freq: Every day | ORAL | Status: DC
  Administered 2020-12-13 (×2): 40 mg via ORAL
  Filled 2020-12-12 (×5): qty 4

## 2020-12-12 MED ORDER — LISINOPRIL 5 MG PO TABS: 2.5000 mg | ORAL_TABLET | Freq: Every day | ORAL | Status: DC

## 2020-12-12 MED ORDER — ONDANSETRON HCL 4 MG PO TABS: 4.0000 mg | ORAL_TABLET | Freq: Four times a day (QID) | ORAL | Status: DC | PRN

## 2020-12-12 MED ORDER — DIPHENHYDRAMINE HCL 25 MG PO CAPS
25.0000 mg | ORAL_CAPSULE | Freq: Once | ORAL | Status: AC
  Administered 2020-12-12: 25 mg via ORAL
  Filled 2020-12-12: qty 1

## 2020-12-12 MED ORDER — ACETAMINOPHEN 500 MG PO TABS
1000.0000 mg | ORAL_TABLET | Freq: Once | ORAL | Status: AC
  Administered 2020-12-12: 1000 mg via ORAL
  Filled 2020-12-12: qty 2

## 2020-12-12 MED ORDER — PANTOPRAZOLE SODIUM 40 MG PO TBEC
40.0000 mg | DELAYED_RELEASE_TABLET | Freq: Every day | ORAL | Status: DC
  Administered 2020-12-13 – 2020-12-16 (×4): 40 mg via ORAL
  Filled 2020-12-12 (×4): qty 1

## 2020-12-12 MED ORDER — OXYCODONE HCL 5 MG PO TABS
5.0000 mg | ORAL_TABLET | Freq: Once | ORAL | Status: AC
  Administered 2020-12-12: 5 mg via ORAL
  Filled 2020-12-12: qty 1

## 2020-12-12 MED ORDER — ADULT MULTIVITAMIN W/MINERALS CH
1.0000 | ORAL_TABLET | Freq: Every day | ORAL | Status: DC
  Administered 2020-12-13 – 2020-12-16 (×4): 1 via ORAL
  Filled 2020-12-12 (×4): qty 1

## 2020-12-12 MED ORDER — ASPIRIN EC 81 MG PO TBEC
81.0000 mg | DELAYED_RELEASE_TABLET | Freq: Every day | ORAL | Status: DC
  Administered 2020-12-13 – 2020-12-16 (×3): 81 mg via ORAL
  Filled 2020-12-12 (×3): qty 1

## 2020-12-12 MED ORDER — CEFAZOLIN SODIUM-DEXTROSE 1-4 GM/50ML-% IV SOLN
1.0000 g | Freq: Three times a day (TID) | INTRAVENOUS | Status: DC
  Administered 2020-12-13 – 2020-12-14 (×7): 1 g via INTRAVENOUS
  Filled 2020-12-12 (×11): qty 50

## 2020-12-12 MED ORDER — ENOXAPARIN SODIUM 40 MG/0.4ML IJ SOSY
40.0000 mg | PREFILLED_SYRINGE | INTRAMUSCULAR | Status: DC
  Administered 2020-12-13 – 2020-12-15 (×4): 40 mg via SUBCUTANEOUS
  Filled 2020-12-12 (×4): qty 0.4

## 2020-12-12 MED ORDER — INSULIN ASPART 100 UNIT/ML IJ SOLN
0.0000 [IU] | Freq: Three times a day (TID) | INTRAMUSCULAR | Status: DC
  Filled 2020-12-12 (×3): qty 1

## 2020-12-12 MED ORDER — ACETAMINOPHEN 650 MG RE SUPP: 650.0000 mg | Freq: Four times a day (QID) | RECTAL | Status: DC | PRN

## 2020-12-12 MED ORDER — ALBUTEROL SULFATE (2.5 MG/3ML) 0.083% IN NEBU: 3.0000 mL | INHALATION_SOLUTION | Freq: Four times a day (QID) | RESPIRATORY_TRACT | Status: DC | PRN

## 2020-12-12 MED ORDER — ONDANSETRON HCL 4 MG/2ML IJ SOLN: 4.0000 mg | Freq: Four times a day (QID) | INTRAMUSCULAR | Status: DC | PRN

## 2020-12-12 MED ORDER — INSULIN DETEMIR 100 UNIT/ML ~~LOC~~ SOLN
10.0000 [IU] | Freq: Every day | SUBCUTANEOUS | Status: DC
  Administered 2020-12-13: 10 [IU] via SUBCUTANEOUS
  Filled 2020-12-12 (×4): qty 0.1

## 2020-12-12 MED ORDER — SULFAMETHOXAZOLE-TRIMETHOPRIM 800-160 MG PO TABS
1.0000 | ORAL_TABLET | Freq: Once | ORAL | Status: AC
  Administered 2020-12-12: 1 via ORAL
  Filled 2020-12-12: qty 1

## 2020-12-12 MED ORDER — ZONISAMIDE 100 MG PO CAPS
100.0000 mg | ORAL_CAPSULE | Freq: Every day | ORAL | Status: DC
  Administered 2020-12-16: 100 mg via ORAL
  Filled 2020-12-12 (×4): qty 1

## 2020-12-12 MED ORDER — ATORVASTATIN CALCIUM 20 MG PO TABS
40.0000 mg | ORAL_TABLET | Freq: Every day | ORAL | Status: DC
  Administered 2020-12-13 – 2020-12-15 (×4): 40 mg via ORAL
  Filled 2020-12-12 (×4): qty 2

## 2020-12-12 MED ORDER — ACETAMINOPHEN 325 MG PO TABS
650.0000 mg | ORAL_TABLET | Freq: Four times a day (QID) | ORAL | Status: DC | PRN
  Administered 2020-12-13 – 2020-12-16 (×6): 650 mg via ORAL
  Filled 2020-12-12 (×6): qty 2

## 2020-12-12 NOTE — ED Provider Notes (Signed)
[facility] Emergency Department Provider Note ____________________________________________   Event Date/Time   First MD Initiated Contact with Patient 12/12/20 2103     (approximate)  I have reviewed the triage vital signs and the nursing notes.  HISTORY  Chief Complaint Wound Infection   HPI Kristy Mcmillan is a 45 y.o. femalewho presents to the ED for evaluation of possible wound infection.   Chart review indicates patient saw her podiatrist yesterday.  I reviewed this note, ulcer to the area posterior to the left medial malleolus debrided with some deep tunneling.  X-ray without bony erosive changes.  Prescribed Bactrim and Norco.  Wound cultures sent.  Patient presents to the ED for evaluation of continued left ankle pain and itchiness sensation after taking her Bactrim.  She reports taking Bactrim last night and again this morning, and this morning she felt an itchy sensation diffusely on her bilateral forearms that she is concerned represents an allergic reaction.  She denies any rash to this area and reports her pruritus is better now.  She reports continued ankle pain on the left without acute trauma.  Reports taking a Norco tablet with improvement of this pain.  She denies any fevers at home, further falls or trauma.  She has a difficult time telling me what brings her into the ED for acute evaluation.   Past Medical History:  Diagnosis Date   Diabetes mellitus without complication ([place])    Seizures ([place]) 2005 Last sz   childhood febrile sz, then ?eclampsia    Patient Active Problem List   Diagnosis Date Noted   Stroke (cerebrum) ([place]) 07/04/2020   Iron deficiency anemia 03/27/2020   Lower extremity cellulitis 03/09/2020   Vision changes 01/25/2020   Bilateral foot pain 03/28/2019   Low back pain 03/28/2019   Numbness and tingling of both feet 03/28/2019   Neuropathic pain of both legs 02/24/2019   B12 deficiency 01/28/2019   Vitamin D  deficiency 01/28/2019   Cold feet 11/17/2018   Neuropathy due to secondary diabetes mellitus ([place]) 11/04/2018   Pain in joint, lower leg 11/04/2018   PAD (peripheral artery disease) ([place]) 11/04/2018   Morbid obesity ([place]) 07/19/2013   IUFD (intrauterine fetal death) 03/30/13   GERD (gastroesophageal reflux disease) 12/01/2012   Tick bite of abdomen 12/01/2012   Previous cesarean section complicating pregnancy, antepartum condition or complication 41/28/7867   Headache in pregnancy, antepartum 10/25/2012   DM (diabetes mellitus), type 1 ([place]) 09/22/2012   Seizure disorder ([place]) 09/22/2012    Past Surgical History:  Procedure Laterality Date   AMPUTATION TOE Right 03/10/2020   Procedure: AMPUTATION FIFTH RAY WITH IRRIGATION AND DEBRIDEMENT RIGHT FOOT;  Surgeon: Sharlotte Alamo, DPM;  Location: [facility] ORS;  Service: Podiatry;  Laterality: Right;   BUBBLE STUDY  07/09/2020   Procedure: BUBBLE STUDY;  Surgeon: Freada Bergeron, MD;  Location: [facility];  Service: Cardiovascular;;   Cecilia N/A 03-30-13   Procedure: CESAREAN SECTION;  Surgeon: Florian Buff, MD;  Location: [facility];  Service: Obstetrics;  Laterality: N/A;  Repeat Cesarean Section Delivery Nonviable Baby Girl @ 2158   IRRIGATION AND DEBRIDEMENT FOOT Right 03/15/2020   Procedure: IRRIGATION AND DEBRIDEMENT FOOT;  Surgeon: Sharlotte Alamo, DPM;  Location: [facility] ORS;  Service: Podiatry;  Laterality: Right;   LOWER EXTREMITY ANGIOGRAPHY Right 03/13/2020   Procedure: Lower Extremity Angiography;  Surgeon: Katha Cabal, MD;  Location: [facility];  Service: Cardiovascular;  Laterality:  Right;   TEE WITHOUT CARDIOVERSION N/A 07/09/2020   Procedure: TRANSESOPHAGEAL ECHOCARDIOGRAM (TEE);  Surgeon: Freada Bergeron, MD;  Location: [facility] ENDOSCOPY;  Service: Cardiovascular;  Laterality: N/A;    Prior to Admission medications   Medication Sig Start Date End Date Taking? Authorizing Provider   albuterol (VENTOLIN HFA) 108 (90 Base) MCG/ACT inhaler Inhale 1 puff into the lungs every 6 (six) hours as needed for wheezing or shortness of breath. 01/27/20   [provider]  aspirin EC 81 MG EC tablet Take 1 tablet (81 mg total) by mouth daily. Swallow whole. 03/18/20   Nolberto Hanlon, MD  atorvastatin (LIPITOR) 40 MG tablet Take 40 mg by mouth at bedtime. 11/29/19   [provider]  DULoxetine (CYMBALTA) 30 MG capsule Take 30 mg by mouth 2 (two) times daily. 06/05/20   [provider]  ferrous sulfate 325 (65 FE) MG tablet Take 1 tablet (325 mg total) by mouth daily with breakfast. 03/18/20   Nolberto Hanlon, MD  fluticasone (FLONASE) 50 MCG/ACT nasal spray Place 2 sprays into both nostrils daily. 12/01/19   [provider]  insulin detemir (LEVEMIR) 100 UNIT/ML injection Inject 10 Units into the skin daily. Before bed.    [provider]  linagliptin (TRADJENTA) 5 MG TABS tablet Take 5 mg by mouth 3 (three) times daily. 2 tabs in morning, 2 tabs at lunch, and 2 tabs at dinner.    [provider]  lisinopril (ZESTRIL) 2.5 MG tablet Take 2.5 mg by mouth daily.    [provider]  Multiple Vitamins-Minerals (MULTIVITAMIN WITH MINERALS) tablet Take 1 tablet by mouth daily.    [provider]  nortriptyline (PAMELOR) 10 MG capsule Take 40 mg by mouth at bedtime. 06/06/20   [provider]  omeprazole (PRILOSEC) 20 MG capsule Take 20 mg by mouth daily. 03/08/20   [provider]  zonisamide (ZONEGRAN) 100 MG capsule Take 100 mg by mouth daily. 01/26/20 01/25/21  [provider]    Allergies Dilantin [phenytoin sodium extended], Phenobarbital, Phenytoin sodium extended, Aspartame, Aspartame and phenylalanine, Carbamazepine, Cymbalta [duloxetine hcl], Gabapentin, Amoxicillin, Ciprofloxacin, Humalog [insulin lispro], Lantus [insulin glargine], Novolin 70-30 [insulin nph isophane & regular], and Penicillins  Family  History  Problem Relation Age of Onset   Diabetes Mother    Diabetes Father     Social History Social History   Tobacco Use   Smoking status: Some Days    Pack years: 0.00    Types: Cigarettes    Passive exposure: Current   Smokeless tobacco: Never  Substance Use Topics   Alcohol use: No   Drug use: No    Review of Systems  Constitutional: No fever/chills Eyes: No visual changes. ENT: No sore throat. Cardiovascular: Denies chest pain. Respiratory: Denies shortness of breath. Gastrointestinal: No abdominal pain.  No nausea, no vomiting.  No diarrhea.  No constipation. Genitourinary: Negative for dysuria. Musculoskeletal: Negative for back pain.  Subacute left ankle and foot pain. Skin: Negative for rash.  Positive for pruritus. Neurological: Negative for headaches, focal weakness or numbness.  ____________________________________________   PHYSICAL EXAM:  VITAL SIGNS: Vitals:   12/12/20 2113 12/12/20 2114  BP: (!) 158/89   Pulse: 72   Resp: 18   Temp:    SpO2: 98% 98%     Constitutional: Alert and oriented. Well appearing and in no acute distress. Eyes: Conjunctivae are normal. PERRL. EOMI. Head: Atraumatic. Nose: No congestion/rhinnorhea. Mouth/Throat: Mucous membranes are moist.  Oropharynx non-erythematous. Neck: No stridor. No  cervical spine tenderness to palpation. Cardiovascular: Normal rate, regular rhythm. Grossly normal heart sounds.  Good peripheral circulation. Respiratory: Normal respiratory effort.  No retractions. Lungs CTAB. Gastrointestinal: Soft , nondistended, nontender to palpation. No CVA tenderness. Musculoskeletal:  No joint effusions. No signs of acute trauma. Small, 5 mm, ulcerative lesion just posterior to her left medial malleolus.  No fluctuance, expressible purulence or induration surrounding this. Neurologic:  Normal speech and language. No gross focal neurologic deficits are appreciated.  Skin:  Skin is warm, dry and intact. No  rash noted. Psychiatric: Mood and affect are normal. Speech and behavior are normal.  ____________________________________________   LABS (all labs ordered are listed, but only abnormal results are displayed)  Labs Reviewed  COMPREHENSIVE METABOLIC PANEL - Abnormal; Notable for the following components:      Result Value   Glucose, Bld 283 (*)    BUN 26 (*)    Creatinine, Ser 1.46 (*)    GFR, Estimated 45 (*)    All other components within normal limits  CBC WITH DIFFERENTIAL/PLATELET - Abnormal; Notable for the following components:   WBC 12.3 (*)    Platelets 506 (*)    Neutro Abs 8.5 (*)    All other components within normal limits  CBG MONITORING, ED - Abnormal; Notable for the following components:   Glucose-Capillary 294 (*)    All other components within normal limits  CULTURE, BLOOD (ROUTINE X 2)  CULTURE, BLOOD (ROUTINE X 2)  SARS CORONAVIRUS 2 (TAT 6-24 HRS)  LACTIC ACID, PLASMA  URINALYSIS, COMPLETE (UACMP) WITH MICROSCOPIC  LACTIC ACID, PLASMA   ____________________________________________  12 Lead EKG   ____________________________________________  RADIOLOGY  ED MD interpretation: Plain film of the left ankle reviewed by me with evidence of mild periosteal reaction at her site of ulcerative pain  Official radiology report(s): DG Ankle Complete Left  Result Date: 12/12/2020 CLINICAL DATA:  Diabetic wound EXAM: LEFT ANKLE COMPLETE - 3+ VIEW COMPARISON:  None. FINDINGS: Vascular calcifications. Small plantar calcaneal spur. Marked medial soft tissue swelling without emphysema. No frank bony destructive change, there may be slight periosteal reaction at the distal tibia on the medial side. 10 mm osteochondral lesion at the medial talar dome. IMPRESSION: 1. Abundant soft tissue swelling on the medial side of the ankle and lower leg. 2. Nonspecific very subtle periosteal reaction at the tibia without frank bony destructive change. 3. 10 mm osteochondral lesion of  the medial talar dome Electronically Signed   By: Donavan Foil M.D.   On: 12/12/2020 22:08    ____________________________________________   PROCEDURES and INTERVENTIONS  Procedure(s) performed (including Critical Care):  Procedures  Medications  acetaminophen (TYLENOL) tablet 1,000 mg (1,000 mg Oral Given 12/12/20 2155)  oxyCODONE (Oxy IR/ROXICODONE) immediate release tablet 5 mg (5 mg Oral Given 12/12/20 2154)  sulfamethoxazole-trimethoprim (BACTRIM DS) 800-160 MG per tablet 1 tablet (1 tablet Oral Given 12/12/20 2154)  diphenhydrAMINE (BENADRYL) capsule 25 mg (25 mg Oral Given 12/12/20 2154)    ____________________________________________   MDM / ED COURSE   45 year old woman presents to the ED with concerns for allergic reaction due to being on Bactrim for a skin and soft tissue infection, with signs of developing osteomyelitis, requiring medical admission.  Tachycardic in triage that self resolves, otherwise normal vitals.  Exam with small skin findings of an ulcerative lesion posterior to her left medial malleolus.  No significant signs of spreading cellulitis/induration, no expressible purulence.  She is tender to this area.  No signs of neurologic or  vascular deficits.  Blood work with leukocytosis, slightly worsened from yesterday where as an outpatient her WBC was 11.  No signs of sepsis and no lactic acidosis.  Initially was planning to advise the patient to continue her Bactrim course, due to her significant allergy list, with the addition of Benadryl due to her pruritus, to treat her skin and soft tissue infection.  Unfortunately, the x-ray of this left ankle demonstrates some periosteal reaction concerning for early osteomyelitis.  Considering her worsening leukocytosis and his x-ray findings, I think would be prudent to admit to the hospital for treatment of osteo-.    Clinical Course as of 12/12/20 2237  Wed Dec 12, 2020  2236 Discussed the case with Dr. Francine Graven, who agrees to  see for admission [DS]    Clinical Course User Index [DS] Vladimir Crofts, MD    ____________________________________________   FINAL CLINICAL IMPRESSION(S) / ED DIAGNOSES  Final diagnoses:  Other acute osteomyelitis of left tibia ([place])  Acute left ankle pain     ED Discharge Orders     None        Zi Sek   Note:  This document was prepared using Dragon voice recognition software and may include unintentional dictation errors.    Vladimir Crofts, MD 12/12/20 2245

## 2020-12-12 NOTE — ED Triage Notes (Signed)
Pt comes into the ED via [facility] home home c/o right foot pain.  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  Pt was seen by Dr. Cleda Mccreedy yesterday and was told nothing is wrong.  CBG 331, all other VS WNL.  Pt prescribed bactrim yesterday for skin wounds and abscessed tooth. Pt refuses to take any more bactrim because it caused vomiting after 1 pill.  Pt in NAD at this time with even and unlabored respirations.

## 2020-12-12 NOTE — H&P (Signed)
History and Physical    Kristy Mcmillan:[id number] DOB: 24-Mar-1976 DOA: 12/12/2020  PCP: [facility]   Patient coming from: Home  I have personally briefly reviewed patient's old medical records in [place]  Chief Complaint: Foot pain  HPI: Kristy Mcmillan is a 45 y.o. 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 She was seen by her podiatrist 1 day prior to her admission for these concerns. She has swelling involving the left ankle mostly over the medial malleolus with associated pain and differential warmth.  She denies any recent trauma and denies having any fever, no chills, no cough, no shortness of breath, no nausea, no vomiting, no urinary symptoms, no blurred vision, no leg swelling, no palpitations, no diaphoresis, no dizziness or lightheadedness. Labs show sodium 140, potassium 4.6, chloride 106, bicarb 25, glucose 83, BUN 26, creatinine 1.46, calcium 9.8, alkaline phosphatase 59, albumin 3.9, AST 18, ALT 12, total protein 8.0, lactic acid 1.5, white count 12.3, hemoglobin 13.2, hematocrit 39.8, MCV 80.1, RDW 14.7, platelet count 506 X-ray of the left ankle shows abundant soft tissue swelling on the medial side of the ankle and lower leg. Nonspecific very subtle periosteal reaction at the tibia without frank bony destructive change. 10 mm osteochondral lesion of the medial talar dome    ED Course: Patient is a 45 year old female who presents to the ER for evaluation pain swelling involving her left ankle with differential warmth.  She has leukocytosis and imaging shows nonspecific periosteal reaction  concerning for possible osteomyelitis. She will be referred to observation status for further evaluation.    Review of Systems: As per HPI otherwise all other systems reviewed and negative.    Past Medical History:  Diagnosis Date   Diabetes mellitus without complication ([place])    Seizures ([place]) 2005 Last sz   childhood febrile sz, then ?eclampsia    Past Surgical History:  Procedure Laterality Date   AMPUTATION TOE Right 03/10/2020   Procedure: AMPUTATION FIFTH RAY WITH IRRIGATION AND DEBRIDEMENT RIGHT FOOT;  Surgeon: Sharlotte Alamo, DPM;  Location: [facility];  Service: Podiatry;  Laterality: Right;   BUBBLE STUDY  07/09/2020   Procedure: BUBBLE STUDY;  Surgeon: Freada Bergeron, MD;  Location: [place];  Service: Cardiovascular;;   [place] N/A 03/02/2013   Procedure: CESAREAN SECTION;  Surgeon: Florian Buff, MD;  Location: [place] [facility];  Service: Obstetrics;  Laterality: N/A;  Repeat Cesarean Section Delivery Nonviable Baby Girl @ 2158   IRRIGATION AND DEBRIDEMENT FOOT Right 03/15/2020   Procedure: IRRIGATION AND DEBRIDEMENT FOOT;  Surgeon: Sharlotte Alamo, DPM;  Location: [facility];  Service: Podiatry;  Laterality: Right;   LOWER EXTREMITY ANGIOGRAPHY Right 03/13/2020   Procedure: Lower Extremity Angiography;  Surgeon: Katha Cabal, MD;  Location: [place] [facility];  Service: Cardiovascular;  Laterality: Right;   TEE WITHOUT CARDIOVERSION N/A 07/09/2020   Procedure: TRANSESOPHAGEAL ECHOCARDIOGRAM (TEE);  Surgeon: Freada Bergeron, MD;  Location: [facility];  Service: Cardiovascular;  Laterality: N/A;     reports that she has been smoking cigarettes. She has been exposed to tobacco smoke. She has never  used smokeless tobacco. She reports that she does not drink alcohol and does not use drugs.  Allergies  Allergen Reactions   Dilantin [Phenytoin Sodium Extended] Other (See Comments)    Causes seizures   Phenobarbital Other (See Comments)     Causes seizures Other reaction(s): Unknown Causes seizures   Phenytoin Sodium Extended     Other reaction(s): Other (See Comments) Causes seizures   Aspartame Diarrhea and Nausea And Vomiting   Aspartame And Phenylalanine Diarrhea and Nausea And Vomiting   Carbamazepine Other (See Comments)    Other reaction(s): Unknown   Cymbalta [Duloxetine Hcl] Diarrhea   Gabapentin Other (See Comments)    Other reaction(s): Unknown Feels like she is going to have a seizure   Amoxicillin Rash   Ciprofloxacin Rash    blisters   Humalog [Insulin Lispro] Rash   Lantus [Insulin Glargine] Rash   Novolin 70-30 [Insulin Nph Isophane & Regular] Itching and Rash   Penicillins Itching and Rash    Family History  Problem Relation Age of Onset   Diabetes Mother    Diabetes Father       Prior to Admission medications   Medication Sig Start Date End Date Taking? Authorizing Provider  albuterol (VENTOLIN HFA) 108 (90 Base) MCG/ACT inhaler Inhale 1 puff into the lungs every 6 (six) hours as needed for wheezing or shortness of breath. 01/27/20   [provider]  aspirin EC 81 MG EC tablet Take 1 tablet (81 mg total) by mouth daily. Swallow whole. 03/18/20   Nolberto Hanlon, MD  atorvastatin (LIPITOR) 40 MG tablet Take 40 mg by mouth at bedtime. 11/29/19   [provider]  DULoxetine (CYMBALTA) 30 MG capsule Take 30 mg by mouth 2 (two) times daily. 06/05/20   [provider]  ferrous sulfate 325 (65 FE) MG tablet Take 1 tablet (325 mg total) by mouth daily with breakfast. 03/18/20   Nolberto Hanlon, MD  fluticasone (FLONASE) 50 MCG/ACT nasal spray Place 2 sprays into both nostrils daily. 12/01/19   [provider]  insulin detemir (LEVEMIR) 100 UNIT/ML injection Inject 10 Units into the skin daily. Before bed.    [provider]  linagliptin (TRADJENTA) 5 MG TABS tablet Take 5 mg by mouth 3 (three) times daily. 2 tabs in morning, 2 tabs at lunch, and 2 tabs at dinner.     [provider]  lisinopril (ZESTRIL) 2.5 MG tablet Take 2.5 mg by mouth daily.    [provider]  Multiple Vitamins-Minerals (MULTIVITAMIN WITH MINERALS) tablet Take 1 tablet by mouth daily.    [provider]  nortriptyline (PAMELOR) 10 MG capsule Take 40 mg by mouth at bedtime. 06/06/20   [provider]  omeprazole (PRILOSEC) 20 MG capsule Take 20 mg by mouth daily. 03/08/20   [provider]  zonisamide (ZONEGRAN) 100 MG capsule Take 100 mg by mouth daily. 01/26/20 01/25/21  [provider]    Physical Exam: Vitals:   12/12/20 1833 12/12/20 1834 12/12/20 2113 12/12/20 2114  BP:  116/70 (!) 158/89   Pulse:  (!) 125 72   Resp:  18 18   Temp:  (!) 97 F (36.1 C)    TempSrc:  Axillary    SpO2:  96% 98% 98%  Weight: 65.8 kg     Height: 5\' 4"  (1.626 m)        Vitals:   12/12/20 1833 12/12/20 1834 12/12/20 2113 12/12/20 2114  BP:  116/70 (!) 158/89   Pulse:  Marland Kitchen)  125 72   Resp:  18 18   Temp:  (!) 97 F (36.1 C)    TempSrc:  Axillary    SpO2:  96% 98% 98%  Weight: 65.8 kg     Height: 5\' 4"  (1.626 m)         Constitutional: Alert and oriented x 3 . Not in any apparent distress. Very anxious HEENT:      Head: Normocephalic and atraumatic.         Eyes: PERLA, EOMI, Conjunctivae are normal. Sclera is non-icteric.       Mouth/Throat: Mucous membranes are moist.       Neck: Supple with no signs of meningismus. Cardiovascular: Regular rate and rhythm. No murmurs, gallops, or rubs. 2+ symmetrical distal pulses are present . No JVD. No LE edema Respiratory: Respiratory effort normal .Lungs sounds clear bilaterally. No wheezes, crackles, or rhonchi.  Gastrointestinal: Soft, non tender, and non distended with positive bowel sounds.  Genitourinary: No CVA tenderness. Musculoskeletal: Swelling involving the left ankle around the left medial malleolus with differential warmth and redness. Neurologic:  Face is symmetric. Moving all  extremities. No gross focal neurologic deficits . Skin: Skin is warm, dry.  No rash or ulcers Psychiatric: Patient is very anxious   Labs on Admission: I have personally reviewed following labs and imaging studies  CBC: Recent Labs  Lab 12/12/20 1849  WBC 12.3*  NEUTROABS 8.5*  HGB 13.2  HCT 39.8  MCV 80.1  PLT 353*   Basic Metabolic Panel: Recent Labs  Lab 12/12/20 1849  NA 140  K 4.6  CL 106  CO2 25  GLUCOSE 283*  BUN 26*  CREATININE 1.46*  CALCIUM 9.8   GFR: Estimated Creatinine Clearance: 45.4 mL/min (A) (by C-G formula based on SCr of 1.46 mg/dL (H)). Liver Function Tests: Recent Labs  Lab 12/12/20 1849  AST 18  ALT 12  ALKPHOS 59  BILITOT 0.7  PROT 8.0  ALBUMIN 3.9   No results for input(s): LIPASE, AMYLASE in the last 168 hours. No results for input(s): AMMONIA in the last 168 hours. Coagulation Profile: No results for input(s): INR, PROTIME in the last 168 hours. Cardiac Enzymes: No results for input(s): CKTOTAL, CKMB, CKMBINDEX, TROPONINI in the last 168 hours. BNP (last 3 results) No results for input(s): PROBNP in the last 8760 hours. HbA1C: No results for input(s): HGBA1C in the last 72 hours. CBG: Recent Labs  Lab 12/12/20 1842  GLUCAP 294*   Lipid Profile: No results for input(s): CHOL, HDL, LDLCALC, TRIG, CHOLHDL, LDLDIRECT in the last 72 hours. Thyroid Function Tests: No results for input(s): TSH, T4TOTAL, FREET4, T3FREE, THYROIDAB in the last 72 hours. Anemia Panel: No results for input(s): VITAMINB12, FOLATE, FERRITIN, TIBC, IRON, RETICCTPCT in the last 72 hours. Urine analysis:    Component Value Date/Time   COLORURINE AMBER (A) 12/12/2020 2229   APPEARANCEUR CLOUDY (A) 12/12/2020 2229   APPEARANCEUR Hazy 10/23/2012 1807   LABSPEC 1.026 12/12/2020 2229   LABSPEC 1.011 10/23/2012 1807   PHURINE 5.0 12/12/2020 2229   GLUCOSEU 50 (A) 12/12/2020 2229   GLUCOSEU >=500 10/23/2012 1807   HGBUR NEGATIVE 12/12/2020 2229    BILIRUBINUR NEGATIVE 12/12/2020 2229   BILIRUBINUR Negative 10/23/2012 1807   KETONESUR 5 (A) 12/12/2020 2229   PROTEINUR >=300 (A) 12/12/2020 2229   UROBILINOGEN 0.2 10/25/2012 0936   NITRITE NEGATIVE 12/12/2020 2229   LEUKOCYTESUR MODERATE (A) 12/12/2020 2229   LEUKOCYTESUR Negative 10/23/2012 1807    Radiological Exams on Admission: DG  Ankle Complete Left  Result Date: 12/12/2020 CLINICAL DATA:  Diabetic wound EXAM: LEFT ANKLE COMPLETE - 3+ VIEW COMPARISON:  None. FINDINGS: Vascular calcifications. Small plantar calcaneal spur. Marked medial soft tissue swelling without emphysema. No frank bony destructive change, there may be slight periosteal reaction at the distal tibia on the medial side. 10 mm osteochondral lesion at the medial talar dome. IMPRESSION: 1. Abundant soft tissue swelling on the medial side of the ankle and lower leg. 2. Nonspecific very subtle periosteal reaction at the tibia without frank bony destructive change. 3. 10 mm osteochondral lesion of the medial talar dome Electronically Signed   By: Donavan Foil M.D.   On: 12/12/2020 22:08     Assessment/Plan Principal Problem:   Cellulitis of left foot Active Problems:   GERD (gastroesophageal reflux disease)   Seizure disorder (HCC)   Neuropathy due to secondary diabetes mellitus ([place])   Stroke (cerebrum) ([place])   CKD stage 3 due to type 1 diabetes mellitus (HCC)   Cellulitis of left foot Rule out osteomyelitis Patient presents for evaluation of swelling involving the left ankle mostly around the left medial malleolus with associated redness and differential warmth X-ray of the ankle shows nonspecific periosteal reaction suspicious for possible early osteomyelitis Treat patient empirically with Ancef We will request podiatry consult     Diabetes mellitus with complications of stage III chronic kidney disease Continue long-acting insulin Place patient on sliding scale coverage with Accu-Cheks before meals and  at bedtime Maintain consistent carbohydrate diet Renal function is stable    History of seizures Place patient on seizure precautions Continue Zonisamide     GERD Continue Protonix    Depression Continue Cymbalta and nortriptyline    UTI Asymptomatic the patient has pyuria Continue empiric antibiotic therapy with Ancef until urine culture results become available.   DVT prophylaxis: Lovenox  Code Status: full code  Family Communication: Greater than 50% of time was spent discussing patient's condition and plan with her at the bedside.  All questions and concerns have been addressed.  She verbalizes understanding and agrees to the plan. Disposition Plan: Back to previous home environment Consults called: Podiatry Status: Observation    Lijah Bourque MD [facility]     12/12/2020, 11:07 PM

## 2020-12-13 ENCOUNTER — Observation Stay: Payer: Medicare Other

## 2020-12-13 ENCOUNTER — Encounter: Payer: Self-pay | Admitting: Internal Medicine

## 2020-12-13 DIAGNOSIS — L02612 Cutaneous abscess of left foot: Secondary | ICD-10-CM

## 2020-12-13 DIAGNOSIS — N183 Chronic kidney disease, stage 3 unspecified: Secondary | ICD-10-CM | POA: Diagnosis present

## 2020-12-13 DIAGNOSIS — Z9119 Patient's noncompliance with other medical treatment and regimen: Secondary | ICD-10-CM | POA: Diagnosis not present

## 2020-12-13 DIAGNOSIS — G894 Chronic pain syndrome: Secondary | ICD-10-CM | POA: Diagnosis present

## 2020-12-13 DIAGNOSIS — Z880 Allergy status to penicillin: Secondary | ICD-10-CM | POA: Diagnosis not present

## 2020-12-13 DIAGNOSIS — Z9102 Food additives allergy status: Secondary | ICD-10-CM | POA: Diagnosis not present

## 2020-12-13 DIAGNOSIS — N390 Urinary tract infection, site not specified: Secondary | ICD-10-CM | POA: Diagnosis present

## 2020-12-13 DIAGNOSIS — F32A Depression, unspecified: Secondary | ICD-10-CM | POA: Diagnosis present

## 2020-12-13 DIAGNOSIS — E1051 Type 1 diabetes mellitus with diabetic peripheral angiopathy without gangrene: Secondary | ICD-10-CM | POA: Diagnosis present

## 2020-12-13 DIAGNOSIS — S86112A Strain of other muscle(s) and tendon(s) of posterior muscle group at lower leg level, left leg, initial encounter: Secondary | ICD-10-CM | POA: Diagnosis present

## 2020-12-13 DIAGNOSIS — K219 Gastro-esophageal reflux disease without esophagitis: Secondary | ICD-10-CM | POA: Diagnosis present

## 2020-12-13 DIAGNOSIS — I69351 Hemiplegia and hemiparesis following cerebral infarction affecting right dominant side: Secondary | ICD-10-CM | POA: Diagnosis not present

## 2020-12-13 DIAGNOSIS — M25572 Pain in left ankle and joints of left foot: Secondary | ICD-10-CM | POA: Diagnosis present

## 2020-12-13 DIAGNOSIS — L03116 Cellulitis of left lower limb: Secondary | ICD-10-CM | POA: Diagnosis present

## 2020-12-13 DIAGNOSIS — E1065 Type 1 diabetes mellitus with hyperglycemia: Secondary | ICD-10-CM | POA: Diagnosis present

## 2020-12-13 DIAGNOSIS — L97329 Non-pressure chronic ulcer of left ankle with unspecified severity: Secondary | ICD-10-CM | POA: Diagnosis present

## 2020-12-13 DIAGNOSIS — F4325 Adjustment disorder with mixed disturbance of emotions and conduct: Secondary | ICD-10-CM | POA: Diagnosis present

## 2020-12-13 DIAGNOSIS — E1022 Type 1 diabetes mellitus with diabetic chronic kidney disease: Secondary | ICD-10-CM | POA: Diagnosis present

## 2020-12-13 DIAGNOSIS — Z888 Allergy status to other drugs, medicaments and biological substances status: Secondary | ICD-10-CM | POA: Diagnosis not present

## 2020-12-13 DIAGNOSIS — M65172 Other infective (teno)synovitis, left ankle and foot: Secondary | ICD-10-CM | POA: Diagnosis present

## 2020-12-13 DIAGNOSIS — Z20822 Contact with and (suspected) exposure to covid-19: Secondary | ICD-10-CM | POA: Diagnosis present

## 2020-12-13 DIAGNOSIS — G40909 Epilepsy, unspecified, not intractable, without status epilepticus: Secondary | ICD-10-CM | POA: Diagnosis present

## 2020-12-13 DIAGNOSIS — X58XXXA Exposure to other specified factors, initial encounter: Secondary | ICD-10-CM | POA: Diagnosis present

## 2020-12-13 DIAGNOSIS — Z881 Allergy status to other antibiotic agents status: Secondary | ICD-10-CM | POA: Diagnosis not present

## 2020-12-13 DIAGNOSIS — E1042 Type 1 diabetes mellitus with diabetic polyneuropathy: Secondary | ICD-10-CM | POA: Diagnosis present

## 2020-12-13 DIAGNOSIS — E10622 Type 1 diabetes mellitus with other skin ulcer: Secondary | ICD-10-CM | POA: Diagnosis present

## 2020-12-13 LAB — CBC
HCT: 36.3 % (ref 36.0–46.0)
Hemoglobin: 12.1 g/dL (ref 12.0–15.0)
MCH: 26.4 pg (ref 26.0–34.0)
MCHC: 33.3 g/dL (ref 30.0–36.0)
MCV: 79.3 fL — ABNORMAL LOW (ref 80.0–100.0)
Platelets: 431 10*3/uL — ABNORMAL HIGH (ref 150–400)
RBC: 4.58 MIL/uL (ref 3.87–5.11)
RDW: 15 % (ref 11.5–15.5)
WBC: 10.1 10*3/uL (ref 4.0–10.5)
nRBC: 0 % (ref 0.0–0.2)

## 2020-12-13 LAB — GLUCOSE, CAPILLARY
Glucose-Capillary: 180 mg/dL — ABNORMAL HIGH (ref 70–99)
Glucose-Capillary: 186 mg/dL — ABNORMAL HIGH (ref 70–99)
Glucose-Capillary: 211 mg/dL — ABNORMAL HIGH (ref 70–99)
Glucose-Capillary: 169 mg/dL — ABNORMAL HIGH (ref 70–99)
Glucose-Capillary: 153 mg/dL — ABNORMAL HIGH (ref 70–99)

## 2020-12-13 LAB — BASIC METABOLIC PANEL
Anion gap: 9 (ref 5–15)
BUN: 28 mg/dL — ABNORMAL HIGH (ref 6–20)
CO2: 24 mmol/L (ref 22–32)
Calcium: 9.1 mg/dL (ref 8.9–10.3)
Chloride: 106 mmol/L (ref 98–111)
Creatinine, Ser: 1.31 mg/dL — ABNORMAL HIGH (ref 0.44–1.00)
GFR, Estimated: 51 mL/min — ABNORMAL LOW (ref >60)
Glucose, Bld: 140 mg/dL — ABNORMAL HIGH (ref 70–99)
Potassium: 3.9 mmol/L (ref 3.5–5.1)
Sodium: 139 mmol/L (ref 135–145)

## 2020-12-13 LAB — SARS CORONAVIRUS 2 (TAT 6-24 HRS): SARS Coronavirus 2: NEGATIVE

## 2020-12-13 LAB — C-REACTIVE PROTEIN: CRP: 0.5 mg/dL (ref <1.0)

## 2020-12-13 LAB — SEDIMENTATION RATE: Sed Rate: 58 mm/hr — ABNORMAL HIGH (ref 0–20)

## 2020-12-13 LAB — SURGICAL PCR SCREEN
MRSA, PCR: POSITIVE — AB
Staphylococcus aureus: POSITIVE — AB

## 2020-12-13 LAB — LACTIC ACID, PLASMA: Lactic Acid, Venous: 1.4 mmol/L (ref 0.5–1.9)

## 2020-12-13 MED ORDER — GLIMEPIRIDE 2 MG PO TABS
2.0000 mg | ORAL_TABLET | Freq: Every day | ORAL | Status: DC
  Administered 2020-12-15 – 2020-12-16 (×2): 2 mg via ORAL
  Filled 2020-12-13 (×4): qty 1

## 2020-12-13 MED ORDER — CHLORHEXIDINE GLUCONATE 4 % EX LIQD
60.0000 mL | Freq: Once | CUTANEOUS | Status: AC
  Administered 2020-12-14: 4 via TOPICAL

## 2020-12-13 MED ORDER — LINAGLIPTIN 5 MG PO TABS
5.0000 mg | ORAL_TABLET | Freq: Every day | ORAL | Status: DC
  Administered 2020-12-13 – 2020-12-16 (×4): 5 mg via ORAL
  Filled 2020-12-13 (×5): qty 1

## 2020-12-13 MED ORDER — MUPIROCIN 2 % EX OINT
1.0000 "application " | TOPICAL_OINTMENT | Freq: Two times a day (BID) | CUTANEOUS | Status: DC
  Administered 2020-12-13 – 2020-12-16 (×7): 1 via NASAL
  Filled 2020-12-13: qty 22

## 2020-12-13 MED ORDER — CHLORHEXIDINE GLUCONATE CLOTH 2 % EX PADS
6.0000 | MEDICATED_PAD | Freq: Every day | CUTANEOUS | Status: DC
  Administered 2020-12-14 – 2020-12-16 (×3): 6 via TOPICAL

## 2020-12-13 NOTE — ED Notes (Signed)
Pt IV flushed by this RN and found to be infiltrated. Iv removed. Bandage placed.

## 2020-12-13 NOTE — TOC Progression Note (Signed)
Transition of Care [facility]) - Progression Note    Patient Details  Name: Kristy Mcmillan MRN: [id number] Date of Birth: Oct 09, 1975  Transition of Care [facility]) CM/SW [place], RN Phone Number: 12/13/2020, 4:52 PM  Clinical Narrative: Patient lives at home with spouse, who can assist her if needed.  She uses insurance provided transportation to get to appointments and has no concerns about getting medications.  Patient uses no DME at home currently.  TOC contact information given, toc to follow to discharge.      Expected Discharge Plan: Home/Self Care Barriers to Discharge: Continued Medical Work up  Expected Discharge Plan and Services Expected Discharge Plan: Home/Self Care       Living arrangements for the past 2 months: Single Family Home                                       Social Determinants of Health (SDOH) Interventions    Readmission Risk Interventions No flowsheet data found.

## 2020-12-13 NOTE — Consult Note (Addendum)
Reason for Consult: Cellulitis left ankle Referring Physician: Agbata  Kristy Mcmillan is an 45 y.o. female.  HPI: This is a 45 year old female with recent development of some redness and swelling on her left foot and ankle.  States that she lost her great toenail and thinks the infection spread from her great toe.  Does not specifically recall any injury.  Was seen outpatient in the office a couple of days ago and placed on antibiotics.  Patient has continued and worsening pain and presents to the emergency department for evaluation and possible debridement.  Past Medical History:  Diagnosis Date   Diabetes mellitus without complication ([place])    Seizures ([place]) 2005 Last sz   childhood febrile sz, then ?eclampsia    Past Surgical History:  Procedure Laterality Date   AMPUTATION TOE Right 03/10/2020   Procedure: AMPUTATION FIFTH RAY WITH IRRIGATION AND DEBRIDEMENT RIGHT FOOT;  Surgeon: Sharlotte Alamo, DPM;  Location: [facility];  Service: Podiatry;  Laterality: Right;   BUBBLE STUDY  07/09/2020   Procedure: BUBBLE STUDY;  Surgeon: Freada Bergeron, MD;  Location: [place];  Service: Cardiovascular;;   [place] N/A 03/02/2013   Procedure: CESAREAN SECTION;  Surgeon: Florian Buff, MD;  Location: [place] [facility];  Service: Obstetrics;  Laterality: N/A;  Repeat Cesarean Section Delivery Nonviable Baby Girl @ 2158   IRRIGATION AND DEBRIDEMENT FOOT Right 03/15/2020   Procedure: IRRIGATION AND DEBRIDEMENT FOOT;  Surgeon: Sharlotte Alamo, DPM;  Location: [facility];  Service: Podiatry;  Laterality: Right;   LOWER EXTREMITY ANGIOGRAPHY Right 03/13/2020   Procedure: Lower Extremity Angiography;  Surgeon: Katha Cabal, MD;  Location: [place] [facility];  Service: Cardiovascular;  Laterality: Right;   TEE WITHOUT CARDIOVERSION N/A 07/09/2020   Procedure: TRANSESOPHAGEAL ECHOCARDIOGRAM (TEE);  Surgeon: Freada Bergeron, MD;  Location: [facility] ENDOSCOPY;  Service: Cardiovascular;   Laterality: N/A;    Family History  Problem Relation Age of Onset   Diabetes Mother    Diabetes Father     Social History:  reports that she has been smoking cigarettes. She has been exposed to tobacco smoke. She has never used smokeless tobacco. She reports that she does not drink alcohol and does not use drugs.  Allergies:  Allergies  Allergen Reactions   Dilantin [Phenytoin Sodium Extended] Other (See Comments)    Causes seizures   Phenobarbital Other (See Comments)    Causes seizures Other reaction(s): Unknown Causes seizures   Phenytoin Sodium Extended     Other reaction(s): Other (See Comments) Causes seizures   Aspartame Diarrhea and Nausea And Vomiting   Aspartame And Phenylalanine Diarrhea and Nausea And Vomiting   Carbamazepine Other (See Comments)    Other reaction(s): Unknown   Cymbalta [Duloxetine Hcl] Diarrhea   Gabapentin Other (See Comments)    Other reaction(s): Unknown Feels like she is going to have a seizure   Amoxicillin Rash   Ciprofloxacin Rash    blisters   Humalog [Insulin Lispro] Rash   Lantus [Insulin Glargine] Rash   Novolin 70-30 [Insulin Nph Isophane & Regular] Itching and Rash   Penicillins Itching and Rash    Medications: Scheduled:  aspirin EC  81 mg Oral Daily   atorvastatin  40 mg Oral QHS   DULoxetine  30 mg Oral BID   enoxaparin (LOVENOX) injection  40 mg Subcutaneous Q24H   ferrous sulfate  325 mg Oral Q breakfast   insulin aspart  0-15 Units Subcutaneous TID WC  insulin detemir  10 Units Subcutaneous QHS   lisinopril  2.5 mg Oral Daily   multivitamin with minerals  1 tablet Oral Daily   nortriptyline  40 mg Oral QHS   pantoprazole  40 mg Oral Daily   zonisamide  100 mg Oral Daily    Results for orders placed or performed during the hospital encounter of 12/12/20 (from the past 48 hour(s))  CBG monitoring, ED     Status: Abnormal   Collection Time: 12/12/20  6:42 PM  Result Value Ref Range   Glucose-Capillary 294 (H) 70  - 99 mg/dL    Comment: Glucose reference range applies only to samples taken after fasting for at least 8 hours.  Sedimentation rate     Status: Abnormal   Collection Time: 12/12/20  6:46 PM  Result Value Ref Range   Sed Rate 58 (H) 0 - 20 mm/hr    Comment: Performed at [facility], [address] [place], [place] [address]  Comprehensive metabolic panel     Status: Abnormal   Collection Time: 12/12/20  6:49 PM  Result Value Ref Range   Sodium 140 135 - 145 mmol/L   Potassium 4.6 3.5 - 5.1 mmol/L   Chloride 106 98 - 111 mmol/L   CO2 25 22 - 32 mmol/L   Glucose, Bld 283 (H) 70 - 99 mg/dL    Comment: Glucose reference range applies only to samples taken after fasting for at least 8 hours.   BUN 26 (H) 6 - 20 mg/dL   Creatinine, Ser 1.46 (H) 0.44 - 1.00 mg/dL   Calcium 9.8 8.9 - 10.3 mg/dL   Total Protein 8.0 6.5 - 8.1 g/dL   Albumin 3.9 3.5 - 5.0 g/dL   AST 18 15 - 41 U/L   ALT 12 0 - 44 U/L   Alkaline Phosphatase 59 38 - 126 U/L   Total Bilirubin 0.7 0.3 - 1.2 mg/dL   GFR, Estimated 45 (L) >60 mL/min    Comment: (NOTE) Calculated using the CKD-EPI Creatinine Equation (2021)    Anion gap 9 5 - 15    Comment: Performed at [facility], [address] [place], [place] [address]  CBC with Differential     Status: Abnormal   Collection Time: 12/12/20  6:49 PM  Result Value Ref Range   WBC 12.3 (H) 4.0 - 10.5 K/uL   RBC 4.97 3.87 - 5.11 MIL/uL   Hemoglobin 13.2 12.0 - 15.0 g/dL   HCT 39.8 36.0 - 46.0 %   MCV 80.1 80.0 - 100.0 fL   MCH 26.6 26.0 - 34.0 pg   MCHC 33.2 30.0 - 36.0 g/dL   RDW 14.7 11.5 - 15.5 %   Platelets 506 (H) 150 - 400 K/uL   nRBC 0.0 0.0 - 0.2 %   Neutrophils Relative % 69 %   Neutro Abs 8.5 (H) 1.7 - 7.7 K/uL   Lymphocytes Relative 25 %   Lymphs Abs 3.1 0.7 - 4.0 K/uL   Monocytes Relative 4 %   Monocytes Absolute 0.5 0.1 - 1.0 K/uL   Eosinophils Relative 1 %   Eosinophils Absolute 0.1 0.0 - 0.5 K/uL   Basophils Relative 1 %    Basophils Absolute 0.1 0.0 - 0.1 K/uL   Immature Granulocytes 0 %   Abs Immature Granulocytes 0.03 0.00 - 0.07 K/uL    Comment: Performed at [facility], [address] [place], [place] [address]  Lactic acid, plasma     Status:  None   Collection Time: 12/12/20  9:24 PM  Result Value Ref Range   Lactic Acid, Venous 1.5 0.5 - 1.9 mmol/L    Comment: Performed at [facility], [address] [place], [place] [address]  Blood culture (routine x 2)     Status: None (Preliminary result)   Collection Time: 12/12/20  9:24 PM   Specimen: BLOOD  Result Value Ref Range   Specimen Description BLOOD RIGHT WRIST    Special Requests      BOTTLES DRAWN AEROBIC AND ANAEROBIC Blood Culture adequate volume   Culture      NO GROWTH < 12 HOURS Performed at [facility], [address], [place], [place] [address]    Report Status PENDING   Blood culture (routine x 2)     Status: None (Preliminary result)   Collection Time: 12/12/20  9:24 PM   Specimen: BLOOD  Result Value Ref Range   Specimen Description BLOOD LEFT ASSIST CONTROL    Special Requests      BOTTLES DRAWN AEROBIC AND ANAEROBIC Blood Culture adequate volume   Culture      NO GROWTH < 12 HOURS Performed at [facility], [address], [place], [place] [address]    Report Status PENDING   Urinalysis, Complete w Microscopic Nasopharyngeal Swab     Status: Abnormal   Collection Time: 12/12/20 10:29 PM  Result Value Ref Range   Color, Urine AMBER (A) YELLOW    Comment: BIOCHEMICALS MAY BE AFFECTED BY COLOR   APPearance CLOUDY (A) CLEAR   Specific Gravity, Urine 1.026 1.005 - 1.030   pH 5.0 5.0 - 8.0   Glucose, UA 50 (A) NEGATIVE mg/dL   Hgb urine dipstick NEGATIVE NEGATIVE   Bilirubin Urine NEGATIVE NEGATIVE   Ketones, ur 5 (A) NEGATIVE mg/dL   Protein, ur >=300 (A) NEGATIVE mg/dL   Nitrite NEGATIVE NEGATIVE   Leukocytes,Ua MODERATE (A) NEGATIVE   RBC / HPF 6-10 0 - 5 RBC/hpf   WBC, UA  11-20 0 - 5 WBC/hpf   Bacteria, UA RARE (A) NONE SEEN   Squamous Epithelial / LPF 21-50 0 - 5   Mucus PRESENT    Hyaline Casts, UA PRESENT     Comment: Performed at [facility], [address] [place], [place] [address]  Lactic acid, plasma     Status: None   Collection Time: 12/12/20 11:54 PM  Result Value Ref Range   Lactic Acid, Venous 1.4 0.5 - 1.9 mmol/L    Comment: Performed at [facility], [address] [place], [place] [address]  Glucose, capillary     Status: Abnormal   Collection Time: 12/13/20  1:07 AM  Result Value Ref Range   Glucose-Capillary 180 (H) 70 - 99 mg/dL    Comment: Glucose reference range applies only to samples taken after fasting for at least 8 hours.   Comment 1 Notify RN   CBC     Status: Abnormal   Collection Time: 12/13/20  4:31 AM  Result Value Ref Range   WBC 10.1 4.0 - 10.5 K/uL   RBC 4.58 3.87 - 5.11 MIL/uL   Hemoglobin 12.1 12.0 - 15.0 g/dL   HCT 36.3 36.0 - 46.0 %   MCV 79.3 (L) 80.0 - 100.0 fL   MCH 26.4 26.0 - 34.0 pg   MCHC 33.3 30.0 - 36.0 g/dL   RDW 15.0 11.5 - 15.5 %   Platelets 431 (H) 150 - 400 K/uL   nRBC  0.0 0.0 - 0.2 %    Comment: Performed at [facility], [address] [place], [place] [address]  Basic metabolic panel     Status: Abnormal   Collection Time: 12/13/20  4:31 AM  Result Value Ref Range   Sodium 139 135 - 145 mmol/L   Potassium 3.9 3.5 - 5.1 mmol/L   Chloride 106 98 - 111 mmol/L   CO2 24 22 - 32 mmol/L   Glucose, Bld 140 (H) 70 - 99 mg/dL    Comment: Glucose reference range applies only to samples taken after fasting for at least 8 hours.   BUN 28 (H) 6 - 20 mg/dL   Creatinine, Ser 1.31 (H) 0.44 - 1.00 mg/dL   Calcium 9.1 8.9 - 10.3 mg/dL   GFR, Estimated 51 (L) >60 mL/min    Comment: (NOTE) Calculated using the CKD-EPI Creatinine Equation (2021)    Anion gap 9 5 - 15    Comment: Performed at [facility], [address] [place], [place] [address]  Glucose,  capillary     Status: Abnormal   Collection Time: 12/13/20  7:53 AM  Result Value Ref Range   Glucose-Capillary 186 (H) 70 - 99 mg/dL    Comment: Glucose reference range applies only to samples taken after fasting for at least 8 hours.    DG Ankle Complete Left  Result Date: 12/12/2020 CLINICAL DATA:  Diabetic wound EXAM: LEFT ANKLE COMPLETE - 3+ VIEW COMPARISON:  None. FINDINGS: Vascular calcifications. Small plantar calcaneal spur. Marked medial soft tissue swelling without emphysema. No frank bony destructive change, there may be slight periosteal reaction at the distal tibia on the medial side. 10 mm osteochondral lesion at the medial talar dome. IMPRESSION: 1. Abundant soft tissue swelling on the medial side of the ankle and lower leg. 2. Nonspecific very subtle periosteal reaction at the tibia without frank bony destructive change. 3. 10 mm osteochondral lesion of the medial talar dome Electronically Signed   By: Donavan Foil M.D.   On: 12/12/2020 22:08    Review of Systems  Constitutional:  Negative for chills and fever.  HENT:  Negative for sinus pain and sore throat.   Respiratory:  Negative for cough and shortness of breath.   Cardiovascular:  Negative for chest pain and palpitations.  Gastrointestinal:  Negative for nausea and vomiting.  Endocrine: Negative for polydipsia and polyuria.  Genitourinary:  Negative for frequency and urgency.  Musculoskeletal:        Patient relates some chronic pain in her right foot, especially in the arch from previous surgery.  Pain is well in her left ankle area.  Skin:        Patient relates some redness and swelling in her left ankle.  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  Psychiatric/Behavioral:  Negative for agitation. The patient is not nervous/anxious.   Blood pressure 124/80, pulse (!) 105, temperature 98.3 F (36.8 C), temperature source Oral, resp. rate 16, height 5\' 4"  (1.626  m), weight 65.8 kg, SpO2 98 %, unknown if currently breastfeeding. Physical Exam Cardiovascular:     Comments: DP pulses 2/4 bilateral.  PT pulse 1/4. Musculoskeletal:     Comments: Previous fifth ray resection on the right foot.  Some pain noted on palpation and motion bilateral.  Skin:    Comments: The skin is warm dry and supple.  Some chronic scar tissue in the plantar right arch from  previous surgery.  All of the previous wounds on the right foot are healed in.  Some erythema and edema on the medial aspect of the left rear foot and ankle.  Scabbed over ulceration just superior to the medial malleolus that was a full-thickness ulcer wound, almost puncture wound like a couple of days ago in the office.  Neurological:     Comments: Some degree of neuropathy distally in the toes bilateral.  Still has pain sensation.    Assessment/Plan: Assessment: 1.  Cellulitis with abscess left foot and ankle. 2.  Diabetes with some degree of neuropathy. 3.  Chronic pain right foot from previous surgery.  Plan: We will obtain MRI for evaluation of abscess and potential osteomyelitis of the medial malleolus.  Most likely will need some type of surgical debridement which will most likely be tomorrow afternoon with Dr. Luana Shu who is on-call.  Durward Fortes 12/13/2020, 8:40 AM

## 2020-12-13 NOTE — Progress Notes (Signed)
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 MD made aware. Standing orders placed.

## 2020-12-13 NOTE — Progress Notes (Signed)
Inpatient Diabetes Program Recommendations  AACE/ADA: New Consensus Statement on Inpatient Glycemic Control  Target Ranges:  Prepandial:   less than 140 mg/dL      Peak postprandial:   less than 180 mg/dL (1-2 hours)      Critically ill patients:  140 - 180 mg/dL   Results for VERNIDA, MCNICHOLAS (MRN [id number]) as of 12/13/2020 10:52  Ref. Range 12/12/2020 18:42 12/13/2020 01:07 12/13/2020 07:53  Glucose-Capillary Latest Ref Range: 70 - 99 mg/dL 294 (H) 180 (H) 186 (H)  Results for JOORY, GOUGH (MRN [id number]) as of 12/13/2020 10:52  Ref. Range 07/04/2020 02:51  Hemoglobin A1C Latest Ref Range: 4.8 - 5.6 % 8.1 (H)    Review of Glycemic Control  Diabetes history: DM2 Outpatient Diabetes medications: Tradjenta 10 mg TID (prescribed 5 mg once a day), Levemir 10 units QHS, Humalog 6 units TID with meals (not taking Levemir or Humalog) Current orders for Inpatient glycemic control: Levemir 10 units QHS, Novolog 0-15 units TID with meals  Inpatient Diabetes Program Recommendations:    Inpatient DM medication: Patient states she is not going to take insulin while inpatient because it makes her sick. Patient is willing to take oral DM meds. May want to consider ordering Tradjenta 5 mg daily and Amaryl 1 mg daily.  NOTE: In reviewing chart, noted patient sees H. Onnie Graham, NP [facility]); last office visit 01/20/2020. Noted telephone note on 11/02/20 which notes patient "called the office earlier today with request for refill of Tradjenta. -Current orders are for Tradjenta 5 mg daily, Levemir 10 units each evening and Humalog 6 units 3 times daily. -Patient reported to me on the phone that she has been taking Tradjenta 5 mg tablets, 3 tablets 3 times daily for approximately the last week and has now run out of them, requesting refill today. I explained to her that she is significantly exceeding maximum recommended dose and that I cannot represcribe Tradjenta for her at this time -She has a  current supply of Levemir with orders for 10 units each evening. She also has a current supply of Humalog with orders for 6 units 3 times daily before meals. Both of these prescriptions were picked up at her local pharmacy on 09/04/2020. Verbally reinforced the orders on the telephone today. Patient states that she "cannot take insulin because it makes her throw up".-I explained to her that it is a hormone and she can and should take the insulin as ordered. - She became upset and stated that she would drink pickle juice and vinegar to bring her blood sugars down. I advised her that I was not familiar with using this as a method of blood sugar control, at which point she abruptly hung up." Patient had follow up visit for 11/13/20 and canceled the appointment and rescheduled for 01/11/21.  Called patient over phone to inquire about DM regimen and control. Patient states that she is taking Tradjenta 10 mg BID. Patient states she is not taking insulin because it makes her sick (vomiting and diarrhea). Patient states that she has asked her diabetes doctor to change her DM medications to all pills because she can't take insulin. Asked patient if she could take the insulin while inpatient and patient states she is not going to take the insulin inpatient either because it makes her sick. Explained that she will need to be taking DM medications consistently and explained that she is taking more than max dose of Tradjenta 5 mg daily and she is only prescribed  5 mg daily. Patient reports that her glucose was doing good until she got an infection. She reports it was 130-140 mg/dl fasting and went up some in the afternoon after eating. Discussed glucose and A1C goals. Discussed importance of checking CBGs and maintaining good CBG control to prevent long-term and short-term complications. Explained how hyperglycemia leads to damage within blood vessels which lead to the common complications seen with uncontrolled diabetes. Stressed  to the patient the importance of improving glycemic control to prevent further complications from uncontrolled diabetes. Explained that in order for cellulitis to improve, it was imperative that she get DM controlled. Inquired about what she would be willing to take inpatient for glycemic control. Patient states she has her Tradjenta in her purse and could take those. I asked her NOT to take any of her outpatient medications while inpatient as the attending provider would order the medications they wanted her to take.  Inquired about other oral DM medications taken in the past and she states she has not taken any other pills except Metformin and she did not tolerate. Inquired about Amarly or Prandin and patient stated she has never taken them.  Patient states she would be willing to take other oral DM medications if ordered but she will not take the insulin. Patient has an upcoming appointment in July with Endocrinology and patient states she is thinking about getting a new doctor for diabetes because the one she has never listens to her and just keeps prescribing insulin which makes her sick. Encouraged patient to be sure to follow up with Endocrinology as she needs help with managing DM. Patient verbalized understanding of information discussed and reports no further questions at this time related to diabetes.  Thanks, Barnie Alderman, RN, MSN, CDE Diabetes Coordinator Inpatient Diabetes Program [phone] (Team Pager)

## 2020-12-13 NOTE — Consult Note (Addendum)
NAME: Kristy Mcmillan  DOB: April 05, 1976  MRN: [id number]  Date/Time: 12/13/2020 2:45 PM  REQUESTING PROVIDER: Dr. Luana Shu Subjective:  REASON FOR CONSULT: Left foot infection ? Kristy Mcmillan is a 45 y.o. female well-known to me from prior admission.  She has a history of poorly controlled diabetes and noncompliance with medical care..  She is admitted with swelling and pain of the left foot and also  pain on the right foot.  she has a history of MRSA infection of the right foot for which she was in the hospital between 03/09/2020 until 03/17/2020 and underwent abscess drainage and fifth toe amputation with partial ray excision by Dr. Cleda Mccreedy.  She also had an angio on 03/13/2020 underwent PTA of the right tibioperoneal trunk.   She was treated initially with IV antibiotics and then sent home on linezolid for 3 weeks.  She was noncompliant with it and eventually completed the course. 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 She recently started complaining of swelling in theLeft ankle area and had televisit with me followed by a visit with podiatrist on 12/11/2020.  I saw her with Dr. Cleda Mccreedy in his office on that day.. It was noted that she had a puncture wound on the medial malleolar area and it was discharging bloody fluid.  Cultures were sent.  Dr. Cleda Mccreedy had done an x-ray.  She was placed on Bactrim and was asked to come to the hospital if it got worse. She was also complaining of pain in the right foot which was thought to be more of scar tissue as the previous infection had healed completely.  Pt apparently was sitting in the side walk yesterday c/o foot pain and when EMS arrived The police were there. She initally did not want to come to ED.She had not taken the bactrim we had prescribed that it was making her nauseous- she was also not taking the insulin  IN the ED vitals BP 158/89, heart rate 72, respiratory rate 18, sats 98%.  Temp was 97. Labs revealed WBC of 12.3, platelet 506 and  creatinine of 1.46. X-ray of the ankle showed abundant soft tissue swelling on the medial side of the ankle and lower leg.  She was started on cefazolin. She was seen by podiatrist and MRI was ordered which showed heterogeneous fluid collection along the posterior medial distal tibia.  Complete tear of the tibialis posterior tendon.  The tear was likely due to septic tenosynovitis.  There was a 3 cm into 1 cm into 7.5 cm craniocaudal collection consistent with an abscess on the medial and posterior medial tibia. Am seeing the patient for the same.  Past Medical History:  Diagnosis Date   Diabetes mellitus without complication ([place])    Seizures ([place]) 2005 Last sz   childhood febrile sz, then ?eclampsia    Past Surgical History:  Procedure Laterality Date   AMPUTATION TOE Right 03/10/2020   Procedure: AMPUTATION FIFTH RAY WITH IRRIGATION AND DEBRIDEMENT RIGHT FOOT;  Surgeon: Sharlotte Alamo, DPM;  Location: [facility];  Service: Podiatry;  Laterality: Right;   BUBBLE STUDY  07/09/2020   Procedure: BUBBLE STUDY;  Surgeon: Freada Bergeron, MD;  Location: [place];  Service: Cardiovascular;;   [place] N/A 03/02/2013   Procedure: CESAREAN SECTION;  Surgeon: Florian Buff, MD;  Location: [place] [facility];  Service: Obstetrics;  Laterality: N/A;  Repeat Cesarean Section Delivery Nonviable Baby Girl @ 2158   IRRIGATION AND DEBRIDEMENT FOOT  Right 03/15/2020   Procedure: IRRIGATION AND DEBRIDEMENT FOOT;  Surgeon: Sharlotte Alamo, DPM;  Location: [facility];  Service: Podiatry;  Laterality: Right;   LOWER EXTREMITY ANGIOGRAPHY Right 03/13/2020   Procedure: Lower Extremity Angiography;  Surgeon: Katha Cabal, MD;  Location: [place] [facility];  Service: Cardiovascular;  Laterality: Right;   TEE WITHOUT CARDIOVERSION N/A 07/09/2020   Procedure: TRANSESOPHAGEAL ECHOCARDIOGRAM (TEE);  Surgeon: Freada Bergeron, MD;  [facility];  Service: Cardiovascular;  Laterality: N/A;     Social History   Socioeconomic History   Marital status: Married    Spouse name: Not on file   Number of children: Not on file   Years of education: Not on file   Highest education level: Not on file  Occupational History   Not on file  Tobacco Use   Smoking status: Some Days    Pack years: 0.00    Types: Cigarettes    Passive exposure: Current   Smokeless tobacco: Never  Substance and Sexual Activity   Alcohol use: No   Drug use: No   Sexual activity: Yes  Other Topics Concern   Not on file  Social History Narrative   Not on file   Social Determinants of Health   Financial Resource Strain: Not on file  Food Insecurity: Not on file  Transportation Needs: Not on file  Physical Activity: Not on file  Stress: Not on file  Social Connections: Not on file  Intimate Partner Violence: Not on file    Family History  Problem Relation Age of Onset   Diabetes Mother    Diabetes Father    Allergies  Allergen Reactions   Dilantin [Phenytoin Sodium Extended] Other (See Comments)    Causes seizures   Phenobarbital Other (See Comments)    Causes seizures Other reaction(s): Unknown Causes seizures   Phenytoin Sodium Extended     Other reaction(s): Other (See Comments) Causes seizures   Aspartame Diarrhea and Nausea And Vomiting   Aspartame And Phenylalanine Diarrhea and Nausea And Vomiting   Carbamazepine Other (See Comments)    Other reaction(s): Unknown   Cymbalta [Duloxetine Hcl] Diarrhea   Gabapentin Other (See Comments)    Other reaction(s): Unknown Feels like she is going to have a seizure   Amoxicillin Rash   Ciprofloxacin Rash    blisters   Humalog [Insulin Lispro] Rash   Lantus [Insulin Glargine] Rash   Novolin 70-30 [Insulin Nph Isophane & Regular] Itching and Rash   Penicillins Itching and Rash   I? Current Facility-Administered Medications  Medication Dose Route Frequency Provider Last Rate Last Admin   acetaminophen (TYLENOL) tablet 650 mg  650 mg  Oral Q6H PRN Agbata, Tochukwu, MD       Or   acetaminophen (TYLENOL) suppository 650 mg  650 mg Rectal Q6H PRN Agbata, Tochukwu, MD       albuterol (PROVENTIL) (2.5 MG/3ML) 0.083% nebulizer solution 3 mL  3 mL Inhalation Q6H PRN Agbata, Tochukwu, MD       aspirin EC tablet 81 mg  81 mg Oral Daily Agbata, Tochukwu, MD   81 mg at 12/13/20 1058   atorvastatin (LIPITOR) tablet 40 mg  40 mg Oral QHS Agbata, Tochukwu, MD   40 mg at 12/13/20 0223   ceFAZolin (ANCEF) IVPB 1 g/50 mL premix  1 g Intravenous Q8H Agbata, Tochukwu, MD 100 mL/hr at 12/13/20 0635 1 g at 12/13/20 0635   chlorhexidine (HIBICLENS) 4 % liquid 4 application  60 mL Topical Once Baker,  Mitzi Hansen, DPM       [START ON 12/14/2020] Chlorhexidine Gluconate Cloth 2 % PADS 6 each  6 each Topical Q0600 Lorella Nimrod, MD       DULoxetine (CYMBALTA) DR capsule 30 mg  30 mg Oral BID Agbata, Tochukwu, MD   30 mg at 12/13/20 1058   enoxaparin (LOVENOX) injection 40 mg  40 mg Subcutaneous Q24H Agbata, Tochukwu, MD   40 mg at 12/13/20 0216   ferrous sulfate tablet 325 mg  325 mg Oral Q breakfast Agbata, Tochukwu, MD   325 mg at 12/13/20 0839   [START ON 12/14/2020] glimepiride (AMARYL) tablet 2 mg  2 mg Oral Q breakfast Lorella Nimrod, MD       insulin aspart (novoLOG) injection 0-15 Units  0-15 Units Subcutaneous TID WC Agbata, Tochukwu, MD       insulin detemir (LEVEMIR) injection 10 Units  10 Units Subcutaneous QHS Agbata, Tochukwu, MD       linagliptin (TRADJENTA) tablet 5 mg  5 mg Oral Daily Lorella Nimrod, MD   5 mg at 12/13/20 1304   multivitamin with minerals tablet 1 tablet  1 tablet Oral Daily Agbata, Tochukwu, MD   1 tablet at 12/13/20 1104   mupirocin ointment (BACTROBAN) 2 % 1 application  1 application Nasal BID Lorella Nimrod, MD       nortriptyline (PAMELOR) capsule 40 mg  40 mg Oral QHS Agbata, Tochukwu, MD   40 mg at 12/13/20 0223   ondansetron (ZOFRAN) tablet 4 mg  4 mg Oral Q6H PRN Agbata, Tochukwu, MD       Or   ondansetron (ZOFRAN)  injection 4 mg  4 mg Intravenous Q6H PRN Agbata, Tochukwu, MD       pantoprazole (PROTONIX) EC tablet 40 mg  40 mg Oral Daily Agbata, Tochukwu, MD   40 mg at 12/13/20 1105   zonisamide (ZONEGRAN) capsule 100 mg  100 mg Oral Daily Agbata, Tochukwu, MD   100 mg at 12/13/20 1057     Abtx:  Anti-infectives (From admission, onward)    Start     Dose/Rate Route Frequency Ordered Stop   12/12/20 2300  ceFAZolin (ANCEF) IVPB 1 g/50 mL premix        1 g 100 mL/hr over 30 Minutes Intravenous Every 8 hours 12/12/20 2251 12/19/20 2159   12/12/20 2130  sulfamethoxazole-trimethoprim (BACTRIM DS) 800-160 MG per tablet 1 tablet        1 tablet Oral  Once 12/12/20 2127 12/12/20 2154       REVIEW OF SYSTEMS:  Const: negative fever, negative chills, negative weight loss Eyes: negative diplopia or visual changes, negative eye pain ENT: negative coryza, negative sore throat Resp: negative cough, hemoptysis, dyspnea Cards: negative for chest pain, palpitations, lower extremity edema GU: negative for frequency, dysuria and hematuria GI: Negative for abdominal pain, diarrhea, bleeding, constipation Skin: She says she had itching with Bactrim.  Heme: negative for easy bruising and gum/nose bleeding MS: Pain both l feet..   Neurolo:negative for headaches, dizziness, vertigo, memory problems  Psych:  history of seizures, anxiety Endocrine: Poorly controlled diabetes mellitus allergy/Immunology-multiple drugs listed as allergy but most of them are side effects. Objective:  VITALS:  BP 100/66 (BP Location: Right Arm)   Pulse 99   Temp 98 F (36.7 C) (Oral)   Resp 16   Ht 5\' 4"  (1.626 m)   Wt 65.8 kg   LMP  (Approximate) Comment: pt reports that she spots at random times and the last spotting  was 3 months ago  SpO2 99%   BMI 24.89 kg/m  PHYSICAL EXAM:  General: Awake and alert in no distress. Head: Normocephalic, without obvious abnormality, atraumatic. Eyes: Conjunctivae clear, anicteric sclerae.  Pupils are equal ENT Nares normal. No drainage or sinus tenderness. Neck: Supple, symmetrical, no adenopathy, thyroid: non tender no carotid bruit and no JVD. Back: No CVA tenderness. Lungs: Clear to auscultation bilaterally. No Wheezing or Rhonchi. No rales. Heart: Regular rate and rhythm, no murmur, rub or gallop. Abdomen: Soft, non-tender,not distended. Bowel sounds normal. No masses Extremities: Left foot medial aspect  There is a small puncture wound which is covered with a scab The erythema around the wound is minimal.  There is swelling.         Right foot the previous surgical site is healed well.  There is scar tissue.  The right great toe is pointed upwards Skin: No rashes or lesions. Or bruising Lymph: Cervical, supraclavicular normal. Neurologic: Peripheral neuropathy Pertinent Labs Lab Results CBC    Component Value Date/Time   WBC 10.1 12/13/2020 0431   RBC 4.58 12/13/2020 0431   HGB 12.1 12/13/2020 0431   HGB 11.6 (L) 10/23/2012 1807   HCT 36.3 12/13/2020 0431   HCT 34.0 (L) 10/23/2012 1807   PLT 431 (H) 12/13/2020 0431   PLT 367 10/23/2012 1807   MCV 79.3 (L) 12/13/2020 0431   MCV 83 10/23/2012 1807   MCH 26.4 12/13/2020 0431   MCHC 33.3 12/13/2020 0431   RDW 15.0 12/13/2020 0431   RDW 12.8 10/23/2012 1807   LYMPHSABS 3.1 12/12/2020 1849   MONOABS 0.5 12/12/2020 1849   EOSABS 0.1 12/12/2020 1849   BASOSABS 0.1 12/12/2020 1849    CMP Latest Ref Rng & Units 12/13/2020 12/12/2020 07/09/2020  Glucose 70 - 99 mg/dL 140(H) 283(H) 164(H)  BUN 6 - 20 mg/dL 28(H) 26(H) 20  Creatinine 0.44 - 1.00 mg/dL 1.31(H) 1.46(H) 1.02(H)  Sodium 135 - 145 mmol/L 139 140 138  Potassium 3.5 - 5.1 mmol/L 3.9 4.6 4.0  Chloride 98 - 111 mmol/L 106 106 106  CO2 22 - 32 mmol/L 24 25 21(L)  Calcium 8.9 - 10.3 mg/dL 9.1 9.8 8.9  Total Protein 6.5 - 8.1 g/dL - 8.0 -  Total Bilirubin 0.3 - 1.2 mg/dL - 0.7 -  Alkaline Phos 38 - 126 U/L - 59 -  AST 15 - 41 U/L - 18 -  ALT 0 - 44  U/L - 12 -      Microbiology: Recent Results (from the past 240 hour(s))  Blood culture (routine x 2)     Status: None (Preliminary result)   Collection Time: 12/12/20  9:24 PM   Specimen: BLOOD  Result Value Ref Range Status   Specimen Description BLOOD RIGHT WRIST  Final   Special Requests   Final    BOTTLES DRAWN AEROBIC AND ANAEROBIC Blood Culture adequate volume   Culture   Final    NO GROWTH < 12 HOURS Performed at [facility], [address] [place], [place] [address]    Report Status PENDING  Incomplete  Blood culture (routine x 2)     Status: None (Preliminary result)   Collection Time: 12/12/20  9:24 PM   Specimen: BLOOD  Result Value Ref Range Status   Specimen Description BLOOD LEFT ASSIST CONTROL  Final   Special Requests   Final    BOTTLES DRAWN AEROBIC AND ANAEROBIC Blood Culture adequate volume   Culture   Final  NO GROWTH < 12 HOURS Performed at [facility], [place], [place] [address]    Report Status PENDING  Incomplete  SARS CORONAVIRUS 2 (TAT 6-24 HRS) Nasopharyngeal Nasopharyngeal Swab     Status: None   Collection Time: 12/12/20 10:28 PM   Specimen: Nasopharyngeal Swab  Result Value Ref Range Status   SARS Coronavirus 2 NEGATIVE NEGATIVE Final    Comment: (NOTE) SARS-CoV-2 target nucleic acids are NOT DETECTED.  The SARS-CoV-2 RNA is generally detectable in upper and lower respiratory specimens during the acute phase of infection. Negative results do not preclude SARS-CoV-2 infection, do not rule out co-infections with other pathogens, and should not be used as the sole basis for treatment or other patient management decisions. Negative results must be combined with clinical observations, patient history, and epidemiological information. The expected result is Negative.  Fact Sheet for Patients: SugarRoll.be  Fact Sheet for Healthcare  Providers: https://www.woods-mathews.com/  This test is not yet approved or cleared by the [place] FDA and  has been authorized for detection and/or diagnosis of SARS-CoV-2 by FDA under an Emergency Use Authorization (EUA). This EUA will remain  in effect (meaning this test can be used) for the duration of the COVID-19 declaration under Se ction 564(b)(1) of the Act, 21 U.S.C. section 360bbb-3(b)(1), unless the authorization is terminated or revoked sooner.  Performed at [facility], [place] [address], [place], [place] [address]   Surgical pcr screen     Status: Abnormal   Collection Time: 12/13/20 10:56 AM   Specimen: Nasal Mucosa; Nasal Swab  Result Value Ref Range Status   MRSA, PCR POSITIVE (A) NEGATIVE Final    Comment: RESULT CALLED TO, READ BACK BY AND VERIFIED WITH: ANNA RODRIGUEZ AT 2979 ON 12/13/20 JH    Staphylococcus aureus POSITIVE (A) NEGATIVE Final    Comment: (NOTE) The Xpert SA Assay (FDA approved for NASAL specimens in patients 45 years of age and older), is one component of a comprehensive surveillance program. It is not intended to diagnose infection nor to guide or monitor treatment. Performed at [facility], [address] [place], [place] [address]     IMAGING RESULTS: RI of the foot as above. I have personally reviewed the films ? Impression/Recommendation ? Left foot infection. Wonder whether it started as a puncture wound. The MRI shows posterior tibialis tendon rupture with an abscess-like collection .  She is not systemically ill. She is currently on cefazolin. She does have a history of MRSA but currently hold all antibiotics till the patient goes for I&D and cultures are sent  History of complicated right foot infection with MRSA in 2021.  Underwent amputation of the fifth toe and also I&D of the abscess and that has healed well.  There is very likely lot of scar tissue she still complains of pain in that  foot.  Diabetes mellitus poorly controlled because of noncompliance to insulin  Peripheral neuropathy  CKD  Seizure disorder  History of CVA   Peripheral artery disease. Multiple drug intolerance.  Any antibiotic could cause severe vomiting or itching.  No rash has been noted before. ? ___________________________________________________ Discussed with patient, requesting provider Note:  This document was prepared using Dragon voice recognition software and may include unintentional dictation errors.

## 2020-12-13 NOTE — Progress Notes (Signed)
PROGRESS NOTE    Kristy Mcmillan  TDD:[id number] DOB: 1975-10-06 DOA: 12/12/2020 PCP: [facility] [place]   Brief Narrative: Taken from H&P. Kristy Mcmillan is a 45 y.o. 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 She was seen by her podiatrist 1 day prior to her admission for these concerns. MRI with some concern of osteomyelitis and bone necrosis along with an abscess formation which needs drainage.  Podiatry was consulted and patient will most likely be going to the OR tomorrow for incision and drainage and debridement.  Subjective: Patient was complaining of left ankle pain.  She was refusing insulin stating that insulin makes her sick and she does not want to take it.  She was taking multiple doses of Tradjenta as home and would like to continue in a similar manner.  Had a long discussion regarding the importance of having a well-controlled diabetes but looks like patient has a very poor insight of the disease and does not want to do anything against what he think is right.  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  Podiatry was consulted and she will be going to the OR for debridement tomorrow. -Continue with Ancef -Continue with supportive care and pain management  Diabetes mellitus with renal complications.  CBG elevated but patient is  refusing insulin stating that it makes her sick.  She was asking to continue home dose of Tradjenta which she was taking it 3 times a day, maximum dose is 5 mg daily. -Tradjenta 5 mg daily -Add Amaryl -Diabetes coordinator for education as patient has very poor insight of her disease.  Seizure disorder.  No acute concerns. -Continue home dose of zonisamide  GERD. -Continue Protonix  Depression.  No acute concern. -Continue home dose of Cymbalta and nortriptyline  Concern of UTI.  UA with protein and pyuria.  Urine cultures pending. -Continue with Ancef and follow-up with urine cultures.  Objective: Vitals:   12/13/20 0056 12/13/20 0533 12/13/20 0722 12/13/20 1122  BP: 126/75 114/75 124/80 100/66  Pulse: 93 88 (!) 105 99  Resp: 20 20 16 16   Temp: 97.6 F (36.4 C) 97.6 F (36.4 C) 98.3 F (36.8 C) 98 F (36.7 C)  TempSrc: Oral Oral Oral Oral  SpO2: 100% 98% 98% 99%  Weight:      Height:        Intake/Output Summary (Last 24 hours) at 12/13/2020 1537 Last data filed at 12/13/2020 1405 Gross per 24 hour  Intake 441.43 ml  Output --  Net 441.43 ml   Filed Weights   12/12/20 1833  Weight: 65.8 kg    Examination:  General exam: Appears calm and comfortable  Respiratory system: Clear to auscultation. Respiratory effort normal. Cardiovascular system: S1 & S2 heard, RRR. No JVD, murmurs, rubs, gallops or clicks. Gastrointestinal system: Soft, nontender, nondistended, bowel sounds positive. Central nervous system: Alert and  oriented. No focal neurological deficits.Symmetric 5 x 5 power. Extremities: Right little toe amputated, left ankle with mild erythema and circumferential edema, pulses intact and symmetrical. Psychiatry: Judgement and insight appear normal. Mood & affect appropriate.    DVT prophylaxis: Lovenox Code Status: Full Family Communication: Discussed with patient Disposition Plan:  Status is: Inpatient  Remains inpatient appropriate because:Inpatient  level of care appropriate due to severity of illness  Dispo: The patient is from: Home              Anticipated d/c is to: Home              Patient currently is not medically stable to d/c.   Difficult to place patient No               Level of care: Med-Surg  All the records are reviewed and case discussed with Care Management/Social Worker. Management plans discussed with the patient, nursing and they are in agreement.  Consultants:  Podiatry  Procedures:  Antimicrobials:  Ancef  Data Reviewed: I have personally reviewed following labs and imaging studies  CBC: Recent Labs  Lab 12/12/20 1849 12/13/20 0431  WBC 12.3* 10.1  NEUTROABS 8.5*  --   HGB 13.2 12.1  HCT 39.8 36.3  MCV 80.1 79.3*  PLT 506* 710*   Basic Metabolic Panel: Recent Labs  Lab 12/12/20 1849 12/13/20 0431  NA 140 139  K 4.6 3.9  CL 106 106  CO2 25 24  GLUCOSE 283* 140*  BUN 26* 28*  CREATININE 1.46* 1.31*  CALCIUM 9.8 9.1   GFR: Estimated Creatinine Clearance: 50.6 mL/min (A) (by C-G formula based on SCr of 1.31 mg/dL (H)). Liver Function Tests: Recent Labs  Lab 12/12/20 1849  AST 18  ALT 12  ALKPHOS 59  BILITOT 0.7  PROT 8.0  ALBUMIN 3.9   No results for input(s): LIPASE, AMYLASE in the last 168 hours. No results for input(s): AMMONIA in the last 168 hours. Coagulation Profile: No results for input(s): INR, PROTIME in the last 168 hours. Cardiac Enzymes: No results for input(s): CKTOTAL, CKMB, CKMBINDEX, TROPONINI in the last 168 hours. BNP (last 3 results) No results for input(s): PROBNP in the last 8760 hours. HbA1C: No results for input(s): HGBA1C in the last 72 hours. CBG: Recent Labs  Lab 12/12/20 1842 12/13/20 0107 12/13/20 0753 12/13/20 1118  GLUCAP 294* 180* 186* 211*   Lipid Profile: No results for input(s): CHOL, HDL, LDLCALC, TRIG, CHOLHDL, LDLDIRECT in the last 72 hours. Thyroid Function Tests: No results for input(s): TSH, T4TOTAL, FREET4, T3FREE,  THYROIDAB in the last 72 hours. Anemia Panel: No results for input(s): VITAMINB12, FOLATE, FERRITIN, TIBC, IRON, RETICCTPCT in the last 72 hours. Sepsis Labs: Recent Labs  Lab 12/12/20 2124 12/12/20 2354  LATICACIDVEN 1.5 1.4    Recent Results (from the past 240 hour(s))  Blood culture (routine x 2)     Status: None (Preliminary result)   Collection Time: 12/12/20  9:24 PM   Specimen: BLOOD  Result Value Ref Range Status   Specimen Description BLOOD RIGHT WRIST  Final   Special Requests   Final    BOTTLES DRAWN AEROBIC AND ANAEROBIC Blood Culture adequate volume   Culture   Final    NO GROWTH < 12 HOURS Performed at [facility], [address] [place], [place] [address]    Report Status PENDING  Incomplete  Blood culture (routine x 2)     Status: None (Preliminary result)   Collection  Time: 12/12/20  9:24 PM   Specimen: BLOOD  Result Value Ref Range Status   Specimen Description BLOOD LEFT ASSIST CONTROL  Final   Special Requests   Final    BOTTLES DRAWN AEROBIC AND ANAEROBIC Blood Culture adequate volume   Culture   Final    NO GROWTH < 12 HOURS Performed at [facility], [address], [place], [place] [address]    Report Status PENDING  Incomplete  SARS CORONAVIRUS 2 (TAT 6-24 HRS) Nasopharyngeal Nasopharyngeal Swab     Status: None   Collection Time: 12/12/20 10:28 PM   Specimen: Nasopharyngeal Swab  Result Value Ref Range Status   SARS Coronavirus 2 NEGATIVE NEGATIVE Final    Comment: (NOTE) SARS-CoV-2 target nucleic acids are NOT DETECTED.  The SARS-CoV-2 RNA is generally detectable in upper and lower respiratory specimens during the acute phase of infection. Negative results do not preclude SARS-CoV-2 infection, do not rule out co-infections with other pathogens, and should not be used as the sole basis for treatment or other patient management decisions. Negative results must be combined with clinical observations, patient history, and  epidemiological information. The expected result is Negative.  Fact Sheet for Patients: SugarRoll.be  Fact Sheet for Healthcare Providers: https://www.woods-mathews.com/  This test is not yet approved or cleared by the [place] FDA and  has been authorized for detection and/or diagnosis of SARS-CoV-2 by FDA under an Emergency Use Authorization (EUA). This EUA will remain  in effect (meaning this test can be used) for the duration of the COVID-19 declaration under Se ction 564(b)(1) of the Act, 21 U.S.C. section 360bbb-3(b)(1), unless the authorization is terminated or revoked sooner.  Performed at [facility], [place] [address] [place], [place] [address]   Surgical pcr screen     Status: Abnormal   Collection Time: 12/13/20 10:56 AM   Specimen: Nasal Mucosa; Nasal Swab  Result Value Ref Range Status   MRSA, PCR POSITIVE (A) NEGATIVE Final    Comment: RESULT CALLED TO, READ BACK BY AND VERIFIED WITH: ANNA RODRIGUEZ AT 4650 ON 12/13/20 JH    Staphylococcus aureus POSITIVE (A) NEGATIVE Final    Comment: (NOTE) The Xpert SA Assay (FDA approved for NASAL specimens in patients 73 years of age and older), is one component of a comprehensive surveillance program. It is not intended to diagnose infection nor to guide or monitor treatment. Performed at [facility], [address] [place], [place] [address]      Radiology Studies: DG Ankle Complete Left  Result Date: 12/12/2020 CLINICAL DATA:  Diabetic wound EXAM: LEFT ANKLE COMPLETE - 3+ VIEW COMPARISON:  None. FINDINGS: Vascular calcifications. Small plantar calcaneal spur. Marked medial soft tissue swelling without emphysema. No frank bony destructive change, there may be slight periosteal reaction at the distal tibia on the medial side. 10 mm osteochondral lesion at the medial talar dome. IMPRESSION: 1. Abundant soft tissue swelling on the medial side of the ankle and  lower leg. 2. Nonspecific very subtle periosteal reaction at the tibia without frank bony destructive change. 3. 10 mm osteochondral lesion of the medial talar dome Electronically Signed   By: Donavan Foil M.D.   On: 12/12/2020 22:08   MR ANKLE LEFT WO CONTRAST  Result Date: 12/13/2020 CLINICAL DATA:  Diabetic patient with a skin wound, soft tissue swelling and pain about the left ankle. EXAM: MRI OF THE LEFT ANKLE WITHOUT CONTRAST TECHNIQUE: Multiplanar, multisequence MR imaging of the ankle was performed. No intravenous contrast  was administered. COMPARISON:  Plain films left ankle 12/12/2020. FINDINGS: TENDONS Peroneal: Intact. Posteromedial: The tibialis posterior tendon is completely torn. Proximal tendon stump is approximately 2.5 cm above the tip of the medial malleolus. Distal tendon stump is seen just proximal to the navicular. There is heterogeneously increased T2 signal in the expected location of the tendon along the neck of the talus. The flexor hallucis longus and flexor digitorum longus tendons are intact. Anterior: Intact. Achilles: Intact. Plantar Fascia: Intact.  Negative for plantar fasciitis. LIGAMENTS Lateral: Intact. Medial: Intact. CARTILAGE Ankle Joint: Osteochondral lesion of the medial talar dome measures 1.1 cm transverse by 1.4 cm AP. Fluid undermines subchondral bone and the associated bone fragment is T1 and T2 hypointense consistent with osteonecrosis. No tibiotalar joint effusion. Subtalar Joints/Sinus Tarsi: Negative. Bones: Very mild marrow edema is seen in the medial malleolus. No fracture. Other: There is subcutaneous edema about the medial aspect of the ankle. Heterogeneous fluid collection extending along the medial and posteromedial tibia measures up to 3 cm AP by 1 cm transverse by 7.5 cm craniocaudal is consistent with an abscess. The top of the collection is 5.7 cm above the tibial plafond and it extends inferiorly along the expected tract of the tibialis posterior.  IMPRESSION: Heterogeneous fluid collection along the posteromedial distal tibia as described is most consistent with an abscess. Complete tear of the tibialis posterior tendon. Markedly heterogeneous appearance of the expected location of the tendon along the talar neck is worrisome for phlegmon or abscess. The tear is likely due to septic tenosynovitis. Large osteochondral lesion of the medial talar dome with an unstable and necrotic bone fragment present. Very mild marrow and edema in the medial malleolus could be due to osteomyelitis but is more suggestive of reactive change. Electronically Signed   By: Inge Rise M.D.   On: 12/13/2020 09:35    Scheduled Meds:  aspirin EC  81 mg Oral Daily   atorvastatin  40 mg Oral QHS   chlorhexidine  60 mL Topical Once   [START ON 12/14/2020] Chlorhexidine Gluconate Cloth  6 each Topical Q0600   DULoxetine  30 mg Oral BID   enoxaparin (LOVENOX) injection  40 mg Subcutaneous Q24H   ferrous sulfate  325 mg Oral Q breakfast   [START ON 12/14/2020] glimepiride  2 mg Oral Q breakfast   insulin aspart  0-15 Units Subcutaneous TID WC   insulin detemir  10 Units Subcutaneous QHS   linagliptin  5 mg Oral Daily   multivitamin with minerals  1 tablet Oral Daily   mupirocin ointment  1 application Nasal BID   nortriptyline  40 mg Oral QHS   pantoprazole  40 mg Oral Daily   zonisamide  100 mg Oral Daily   Continuous Infusions:   ceFAZolin (ANCEF) IV 1 g (12/13/20 1500)     LOS: 0 days   Time spent: 35 minutes. More than 50% of the time was spent in counseling/coordination of care  Lorella Nimrod, MD [facility]  If 7PM-7AM, please contact night-coverage [email or web address]  12/13/2020, 3:37 PM   This record has been created using Dragon voice recognition software. Errors have been sought and corrected,but may not always be located. Such creation errors do not reflect on the standard of care.

## 2020-12-13 NOTE — Progress Notes (Signed)
Pt refusing to take scheduled Novolog. MD made aware. No orders received.

## 2020-12-14 ENCOUNTER — Other Ambulatory Visit: Payer: Self-pay

## 2020-12-14 ENCOUNTER — Encounter
Admission: EM | Disposition: A | Discharge status: Home Health | Payer: Self-pay | Source: Home / Self Care | Attending: Internal Medicine

## 2020-12-14 ENCOUNTER — Inpatient Hospital Stay: Payer: Medicare Other | Admitting: Anesthesiology

## 2020-12-14 ENCOUNTER — Encounter: Payer: Self-pay | Admitting: Internal Medicine

## 2020-12-14 DIAGNOSIS — L03116 Cellulitis of left lower limb: Secondary | ICD-10-CM | POA: Diagnosis not present

## 2020-12-14 DIAGNOSIS — F4325 Adjustment disorder with mixed disturbance of emotions and conduct: Secondary | ICD-10-CM

## 2020-12-14 HISTORY — PX: IRRIGATION AND DEBRIDEMENT FOOT: SHX6602

## 2020-12-14 HISTORY — PX: BONE BIOPSY: SHX375

## 2020-12-14 LAB — CBC
HCT: 36.2 % (ref 36.0–46.0)
Hemoglobin: 11.8 g/dL — ABNORMAL LOW (ref 12.0–15.0)
MCH: 26.7 pg (ref 26.0–34.0)
MCHC: 32.6 g/dL (ref 30.0–36.0)
MCV: 81.9 fL (ref 80.0–100.0)
Platelets: 392 10*3/uL (ref 150–400)
RBC: 4.42 MIL/uL (ref 3.87–5.11)
RDW: 14.6 % (ref 11.5–15.5)
WBC: 8.6 10*3/uL (ref 4.0–10.5)
nRBC: 0 % (ref 0.0–0.2)

## 2020-12-14 LAB — BASIC METABOLIC PANEL
Anion gap: 5 (ref 5–15)
BUN: 27 mg/dL — ABNORMAL HIGH (ref 6–20)
CO2: 28 mmol/L (ref 22–32)
Calcium: 8.7 mg/dL — ABNORMAL LOW (ref 8.9–10.3)
Chloride: 104 mmol/L (ref 98–111)
Creatinine, Ser: 1.38 mg/dL — ABNORMAL HIGH (ref 0.44–1.00)
GFR, Estimated: 48 mL/min — ABNORMAL LOW (ref >60)
Glucose, Bld: 89 mg/dL (ref 70–99)
Potassium: 4.3 mmol/L (ref 3.5–5.1)
Sodium: 137 mmol/L (ref 135–145)

## 2020-12-14 LAB — GLUCOSE, CAPILLARY
Glucose-Capillary: 123 mg/dL — ABNORMAL HIGH (ref 70–99)
Glucose-Capillary: 146 mg/dL — ABNORMAL HIGH (ref 70–99)
Glucose-Capillary: 122 mg/dL — ABNORMAL HIGH (ref 70–99)
Glucose-Capillary: 158 mg/dL — ABNORMAL HIGH (ref 70–99)
Glucose-Capillary: 282 mg/dL — ABNORMAL HIGH (ref 70–99)

## 2020-12-14 LAB — HEMOGLOBIN A1C
Hgb A1c MFr Bld: 10.9 % — ABNORMAL HIGH (ref 4.8–5.6)
Mean Plasma Glucose: 266 mg/dL

## 2020-12-14 SURGERY — IRRIGATION AND DEBRIDEMENT FOOT
Anesthesia: General | Laterality: Left

## 2020-12-14 MED ORDER — PROPOFOL 10 MG/ML IV BOLUS
INTRAVENOUS | Status: DC | PRN
  Administered 2020-12-14: 50 mg via INTRAVENOUS
  Administered 2020-12-14: 60 mg via INTRAVENOUS

## 2020-12-14 MED ORDER — DEXTROSE-NACL 5-0.45 % IV SOLN: INTRAVENOUS | Status: DC | PRN

## 2020-12-14 MED ORDER — ONDANSETRON HCL 4 MG/2ML IJ SOLN: 4.0000 mg | Freq: Once | INTRAMUSCULAR | Status: DC | PRN

## 2020-12-14 MED ORDER — HYDROMORPHONE HCL 1 MG/ML IJ SOLN
0.2500 mg | INTRAMUSCULAR | Status: DC | PRN
  Administered 2020-12-14: 0.25 mg via INTRAVENOUS

## 2020-12-14 MED ORDER — FENTANYL CITRATE (PF) 100 MCG/2ML IJ SOLN
25.0000 ug | INTRAMUSCULAR | Status: DC | PRN
  Administered 2020-12-14 (×3): 25 ug via INTRAVENOUS

## 2020-12-14 MED ORDER — FENTANYL CITRATE (PF) 100 MCG/2ML IJ SOLN
INTRAMUSCULAR | Status: AC
  Administered 2020-12-14: 25 ug via INTRAVENOUS
  Filled 2020-12-14: qty 2

## 2020-12-14 MED ORDER — LIDOCAINE HCL (PF) 2 % IJ SOLN
INTRAMUSCULAR | Status: AC
  Filled 2020-12-14: qty 6

## 2020-12-14 MED ORDER — PROPOFOL 500 MG/50ML IV EMUL
INTRAVENOUS | Status: DC | PRN
  Administered 2020-12-14: 50 ug/kg/min via INTRAVENOUS

## 2020-12-14 MED ORDER — VANCOMYCIN HCL 1000 MG IV SOLR
INTRAVENOUS | Status: DC | PRN
  Administered 2020-12-14: 1000 mg

## 2020-12-14 MED ORDER — BUPIVACAINE HCL (PF) 0.5 % IJ SOLN
INTRAMUSCULAR | Status: AC
  Filled 2020-12-14: qty 30

## 2020-12-14 MED ORDER — HYDROMORPHONE HCL 1 MG/ML IJ SOLN
INTRAMUSCULAR | Status: AC
  Administered 2020-12-14: 0.25 mg via INTRAVENOUS
  Filled 2020-12-14: qty 1

## 2020-12-14 MED ORDER — FENTANYL CITRATE (PF) 100 MCG/2ML IJ SOLN
INTRAMUSCULAR | Status: DC | PRN
  Administered 2020-12-14 (×2): 25 ug via INTRAVENOUS

## 2020-12-14 MED ORDER — TRAMADOL HCL 50 MG PO TABS
50.0000 mg | ORAL_TABLET | Freq: Four times a day (QID) | ORAL | Status: DC | PRN
  Administered 2020-12-15: 50 mg via ORAL
  Filled 2020-12-14 (×3): qty 1

## 2020-12-14 MED ORDER — ONDANSETRON HCL 4 MG/2ML IJ SOLN
INTRAMUSCULAR | Status: DC | PRN
  Administered 2020-12-14: 4 mg via INTRAVENOUS

## 2020-12-14 MED ORDER — LIDOCAINE HCL (CARDIAC) PF 100 MG/5ML IV SOSY
PREFILLED_SYRINGE | INTRAVENOUS | Status: DC | PRN
  Administered 2020-12-14: 50 mg via INTRAVENOUS

## 2020-12-14 MED ORDER — PHENYLEPHRINE HCL (PRESSORS) 10 MG/ML IV SOLN
INTRAVENOUS | Status: DC | PRN
  Administered 2020-12-14: 100 ug via INTRAVENOUS
  Administered 2020-12-14 (×4): 200 ug via INTRAVENOUS

## 2020-12-14 MED ORDER — MIDAZOLAM HCL 2 MG/2ML IJ SOLN
INTRAMUSCULAR | Status: DC | PRN
  Administered 2020-12-14: 2 mg via INTRAVENOUS

## 2020-12-14 MED ORDER — PROPOFOL 10 MG/ML IV BOLUS
INTRAVENOUS | Status: AC
  Filled 2020-12-14: qty 20

## 2020-12-14 MED ORDER — MIDAZOLAM HCL 2 MG/2ML IJ SOLN
INTRAMUSCULAR | Status: AC
  Filled 2020-12-14: qty 2

## 2020-12-14 MED ORDER — VANCOMYCIN HCL 1000 MG IV SOLR
INTRAVENOUS | Status: AC
  Filled 2020-12-14: qty 1000

## 2020-12-14 MED ORDER — BUPIVACAINE HCL (PF) 0.5 % IJ SOLN
INTRAMUSCULAR | Status: DC | PRN
  Administered 2020-12-14: 20 mL

## 2020-12-14 MED ORDER — DEXAMETHASONE SODIUM PHOSPHATE 10 MG/ML IJ SOLN
INTRAMUSCULAR | Status: DC | PRN
  Administered 2020-12-14: 5 mg via INTRAVENOUS

## 2020-12-14 MED ORDER — SODIUM CHLORIDE 0.9 % IR SOLN
Status: DC | PRN
  Administered 2020-12-14: 3000 mL

## 2020-12-14 MED ORDER — FENTANYL CITRATE (PF) 100 MCG/2ML IJ SOLN
INTRAMUSCULAR | Status: AC
  Filled 2020-12-14: qty 2

## 2020-12-14 MED ORDER — PROPOFOL 1000 MG/100ML IV EMUL
INTRAVENOUS | Status: AC
  Filled 2020-12-14: qty 100

## 2020-12-14 MED ORDER — SODIUM CHLORIDE 0.9 % IV SOLN: INTRAVENOUS | Status: DC | PRN

## 2020-12-14 SURGICAL SUPPLY — 50 items
BNDG ELASTIC 3X5.8 VLCR NS LF (GAUZE/BANDAGES/DRESSINGS) ×1 IMPLANT
BNDG ELASTIC 3X5.8 VLCR STR LF (GAUZE/BANDAGES/DRESSINGS) ×1 IMPLANT
BNDG ELASTIC 4X5.8 VLCR NS LF (GAUZE/BANDAGES/DRESSINGS) ×1 IMPLANT
BNDG ELASTIC 4X5.8 VLCR STR LF (GAUZE/BANDAGES/DRESSINGS) ×1 IMPLANT
BNDG ESMARK 4X12 TAN STRL LF (GAUZE/BANDAGES/DRESSINGS) ×2 IMPLANT
BNDG GAUZE 4.5X4.1 6PLY STRL (MISCELLANEOUS) ×2 IMPLANT
BNDG GAUZE ELAST 4 BULKY (GAUZE/BANDAGES/DRESSINGS) ×1 IMPLANT
CNTNR SPEC 2.5X3XGRAD LEK (MISCELLANEOUS) ×1
CONT SPEC 4OZ STER OR WHT (MISCELLANEOUS) ×1
CONTAINER SPEC 2.5X3XGRAD LEK (MISCELLANEOUS) IMPLANT
COVER WAND RF STERILE (DRAPES) ×2 IMPLANT
CUFF TOURN SGL QUICK 18X4 (TOURNIQUET CUFF) ×1 IMPLANT
DRSG TELFA 4X3 1S NADH ST (GAUZE/BANDAGES/DRESSINGS) ×1 IMPLANT
DURAPREP 26ML APPLICATOR (WOUND CARE) ×2 IMPLANT
ELECT REM PT RETURN 9FT ADLT (ELECTROSURGICAL) ×2
ELECTRODE REM PT RTRN 9FT ADLT (ELECTROSURGICAL) ×1 IMPLANT
GAUZE SPONGE 4X4 12PLY STRL (GAUZE/BANDAGES/DRESSINGS) ×2 IMPLANT
GAUZE XEROFORM 1X8 LF (GAUZE/BANDAGES/DRESSINGS) ×2 IMPLANT
GLOVE SURG ENC MOIS LTX SZ7 (GLOVE) ×2 IMPLANT
GLOVE SURG UNDER LTX SZ7 (GLOVE) ×2 IMPLANT
GOWN STRL REUS W/ TWL LRG LVL3 (GOWN DISPOSABLE) ×2 IMPLANT
GOWN STRL REUS W/TWL LRG LVL3 (GOWN DISPOSABLE) ×2
HANDPIECE VERSAJET DEBRIDEMENT (MISCELLANEOUS) IMPLANT
IV NS IRRIG 3000ML ARTHROMATIC (IV SOLUTION) ×1 IMPLANT
KIT TURNOVER KIT A (KITS) ×2 IMPLANT
MANIFOLD NEPTUNE II (INSTRUMENTS) ×2 IMPLANT
NDL BIOPSY JAMSHIDI 11X6 (NEEDLE) IMPLANT
NDL FILTER BLUNT 18X1 1/2 (NEEDLE) ×1 IMPLANT
NDL HYPO 25X1 1.5 SAFETY (NEEDLE) ×2 IMPLANT
NEEDLE BIOPSY JAMSHIDI 11X6 (NEEDLE) ×2 IMPLANT
NEEDLE FILTER BLUNT 18X 1/2SAF (NEEDLE) ×1
NEEDLE FILTER BLUNT 18X1 1/2 (NEEDLE) ×1 IMPLANT
NEEDLE HYPO 25X1 1.5 SAFETY (NEEDLE) ×4 IMPLANT
NS IRRIG 500ML POUR BTL (IV SOLUTION) ×2 IMPLANT
PACK EXTREMITY ARMC (MISCELLANEOUS) ×2 IMPLANT
PAD ABD DERMACEA PRESS 5X9 (GAUZE/BANDAGES/DRESSINGS) ×2 IMPLANT
PADDING CAST ABS 4INX4YD NS (CAST SUPPLIES) ×1
PADDING CAST ABS COTTON 4X4 ST (CAST SUPPLIES) IMPLANT
PULSAVAC PLUS IRRIG FAN TIP (DISPOSABLE) ×2
SPLINT CAST 1 STEP 4X30 (MISCELLANEOUS) ×1 IMPLANT
STOCKINETTE STRL 6IN 960660 (GAUZE/BANDAGES/DRESSINGS) ×2 IMPLANT
SUT ETHILON 4-0 (SUTURE) ×1
SUT ETHILON 4-0 FS2 18XMFL BLK (SUTURE) ×1
SUT VIC AB 3-0 SH 27 (SUTURE) ×1
SUT VIC AB 3-0 SH 27X BRD (SUTURE) IMPLANT
SUT VIC AB 4-0 FS2 27 (SUTURE) ×1 IMPLANT
SUTURE ETHLN 4-0 FS2 18XMF BLK (SUTURE) IMPLANT
SWAB CULTURE AMIES ANAERIB BLU (MISCELLANEOUS) ×2 IMPLANT
SYR 10ML LL (SYRINGE) ×2 IMPLANT
TIP FAN IRRIG PULSAVAC PLUS (DISPOSABLE) IMPLANT

## 2020-12-14 NOTE — Anesthesia Postprocedure Evaluation (Signed)
Anesthesia Post Note  Patient: Kristy Mcmillan  Procedure(s) Performed: IRRIGATION AND DEBRIDEMENT FOOT & ANKLE (Left) BONE BIOPSY (Left)  Patient location during evaluation: PACU Anesthesia Type: General Level of consciousness: awake and alert Pain management: pain level controlled Vital Signs Assessment: post-procedure vital signs reviewed and stable Respiratory status: spontaneous breathing and respiratory function stable Cardiovascular status: stable Anesthetic complications: no   No notable events documented.   Last Vitals:  Vitals:   12/14/20 1800 12/14/20 1815  BP: 116/80 127/77  Pulse: 83 93  Resp: 12 13  Temp:  (!) 36.2 C  SpO2: 100% 93%    Last Pain:  Vitals:   12/14/20 1815  TempSrc:   PainSc: 6                  Kawanda Drumheller K

## 2020-12-14 NOTE — H&P (Signed)
HISTORY AND PHYSICAL INTERVAL NOTE:  12/14/2020  4:00 PM  Kristy Mcmillan  has presented today for surgery, with the diagnosis of Left Foot and Ankle I and D.  The various methods of treatment have been discussed with the patient.  No guarantees were given.  After consideration of risks, benefits and other options for treatment, the patient has consented to surgery.  I have reviewed the patients' chart and labs.    PROCEDURE: LEFT FOOT INCISION AND DRAINAGE WITH REMOVAL OF ALL NONVIABLE AND NECROTIC TISSUES, BONE BIOPSY TIBIA, POSSIBLE APPLICATION OF ANTIBIOTIC BEADS.  A history and physical examination was performed in the hospital.  The patient was reexamined.  There have been no changes to this history and physical examination.  Kristy Mcmillan, DPM

## 2020-12-14 NOTE — Anesthesia Procedure Notes (Signed)
Procedure Name: LMA Insertion Date/Time: 12/14/2020 4:31 PM Performed by: Aline Brochure, CRNA Pre-anesthesia Checklist: Patient identified, Patient being monitored, Timeout performed, Emergency Drugs available and Suction available Patient Re-evaluated:Patient Re-evaluated prior to induction Oxygen Delivery Method: Circle system utilized Preoxygenation: Pre-oxygenation with 100% oxygen Induction Type: IV induction Ventilation: Mask ventilation without difficulty LMA: LMA inserted Tube type: Oral Number of attempts: 1 Placement Confirmation: positive ETCO2 and breath sounds checked- equal and bilateral Tube secured with: Tape Dental Injury: Teeth and Oropharynx as per pre-operative assessment

## 2020-12-14 NOTE — Progress Notes (Signed)
Date of Admission:  12/12/2020      ID: Kristy Mcmillan is a 45 y.o. 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)    Subjective: Pt in OR  Medications:   aspirin EC  81 mg Oral Daily   atorvastatin  40 mg Oral QHS   Chlorhexidine Gluconate Cloth  6 each Topical Q0600   DULoxetine  30 mg Oral BID   enoxaparin (LOVENOX) injection  40 mg Subcutaneous Q24H   ferrous sulfate  325 mg Oral Q breakfast   glimepiride  2 mg Oral Q breakfast   insulin aspart  0-15 Units Subcutaneous TID WC   insulin detemir  10 Units Subcutaneous QHS   linagliptin  5 mg Oral Daily   multivitamin with minerals  1 tablet Oral Daily   mupirocin ointment  1 application Nasal BID   nortriptyline  40 mg Oral QHS   pantoprazole  40 mg Oral Daily   zonisamide  100 mg Oral Daily    Objective: Vital signs in last 24 hours: Temp:  [97.7 F (36.5 C)-99.6 F (37.6 C)] 98.7 F (37.1 C) (06/17 1117) Pulse Rate:  [76-97] 96 (06/17 1117) Resp:  [16] 16 (06/17 1117) BP: (102-116)/(65-76) 116/72 (06/17 1117) SpO2:  [97 %-100 %] 100 % (06/17 1117)   Lab Results Recent Labs    12/13/20 0431 12/14/20 0352  WBC 10.1 8.6  HGB 12.1 11.8*  HCT 36.3 36.2  NA 139 137  K 3.9 4.3  CL 106 104  CO2 24 28  BUN 28* 27*  CREATININE 1.31* 1.38*   Liver Panel Recent Labs    12/12/20 1849  PROT 8.0  ALBUMIN 3.9  AST 18  ALT 12  ALKPHOS 59  BILITOT 0.7   Sedimentation Rate Recent Labs    12/12/20 1846  ESRSEDRATE 58*   C-Reactive Protein Recent Labs    12/12/20 2354  CRP 0.5    Microbiology:  Studies/Results: DG Ankle Complete Left  Result Date: 12/12/2020 CLINICAL DATA:  Diabetic wound EXAM: LEFT ANKLE COMPLETE - 3+ VIEW COMPARISON:  None. FINDINGS: Vascular  calcifications. Small plantar calcaneal spur. Marked medial soft tissue swelling without emphysema. No frank bony destructive change, there may be slight periosteal reaction at the distal tibia on the medial side. 10 mm osteochondral lesion at the medial talar dome. IMPRESSION: 1. Abundant soft tissue swelling on the medial side of the ankle and lower leg. 2. Nonspecific very subtle periosteal reaction at the tibia without frank bony destructive change. 3. 10 mm osteochondral lesion of the medial talar dome Electronically Signed   By: Donavan Foil M.D.   On: 12/12/2020 22:08   MR ANKLE LEFT WO CONTRAST  Result Date: 12/13/2020 CLINICAL DATA:  Diabetic patient with a skin wound, soft tissue swelling and pain about the left ankle. EXAM: MRI OF THE LEFT ANKLE WITHOUT CONTRAST TECHNIQUE: Multiplanar, multisequence MR imaging of the ankle was performed. No intravenous contrast was administered. COMPARISON:  Plain films left ankle 12/12/2020. FINDINGS: TENDONS Peroneal: Intact. Posteromedial: The tibialis posterior tendon is completely torn. Proximal tendon stump is approximately 2.5 cm above the tip of the medial malleolus. Distal tendon stump is seen just proximal to the navicular. There is heterogeneously increased T2 signal in the expected location of  the tendon along the neck of the talus. The flexor hallucis longus and flexor digitorum longus tendons are intact. Anterior: Intact. Achilles: Intact. Plantar Fascia: Intact.  Negative for plantar fasciitis. LIGAMENTS Lateral: Intact. Medial: Intact. CARTILAGE Ankle Joint: Osteochondral lesion of the medial talar dome measures 1.1 cm transverse by 1.4 cm AP. Fluid undermines subchondral bone and the associated bone fragment is T1 and T2 hypointense consistent with osteonecrosis. No tibiotalar joint effusion. Subtalar Joints/Sinus Tarsi: Negative. Bones: Very mild marrow edema is seen in the medial malleolus. No fracture. Other: There is subcutaneous edema about the  medial aspect of the ankle. Heterogeneous fluid collection extending along the medial and posteromedial tibia measures up to 3 cm AP by 1 cm transverse by 7.5 cm craniocaudal is consistent with an abscess. The top of the collection is 5.7 cm above the tibial plafond and it extends inferiorly along the expected tract of the tibialis posterior. IMPRESSION: Heterogeneous fluid collection along the posteromedial distal tibia as described is most consistent with an abscess. Complete tear of the tibialis posterior tendon. Markedly heterogeneous appearance of the expected location of the tendon along the talar neck is worrisome for phlegmon or abscess. The tear is likely due to septic tenosynovitis. Large osteochondral lesion of the medial talar dome with an unstable and necrotic bone fragment present. Very mild marrow and edema in the medial malleolus could be due to osteomyelitis but is more suggestive of reactive change. Electronically Signed   By: Inge Rise M.D.   On: 12/13/2020 09:35     Assessment/Plan: Left ankle abscess around the medial aspect.  With complete tear of the tibialis posterior tendon likely due to infection.  There is a large osteochondral lesion of the medial talar dome with an unstable and necrotic bone fragment present which could be osteomyelitis.   Patient is getting  I&D today.  Currently on cefazolin.  She has history of MRSA if cultures come back MRSA will change that into vancomycin.  Peripheral artery disease  Seizure disorders  Poorly controlled diabetes mellitus due to noncompliance to insulin  CKD  History of CVA  Multiple drug intolerance.  Most of these are GI side effects. ID will follow her peripherally this weekend- call if needed

## 2020-12-14 NOTE — Progress Notes (Signed)
PODIATRY / FOOT AND ANKLE SURGERY CONSULTATION NOTE  Reason for consult: Left ankle/foot infection, multiple complaints of pain to bilateral lower extremities  Chief Complaint: Left and right foot and ankle pain   HPI: Kristy Mcmillan is a 45 y.o. female who presents resting in bed today fairly comfortably.  Patient notes pain throughout both feet and ankles.  Patient does have a history of chronic pain.  Patient has seen Dr. Cleda Mccreedy several times in the past and has had numerous procedures due to infection present.  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  Patient has also had right lower extremity angiogram with intervention.  Patient is also had a right foot incision and drainage in the past as well.  Patient complains of pain to all these areas.  Patient has once again history of chronic pain.  Patient also is complaining about the left medial ankle today as there is an area of the small opening that appears to be scabbed over at this time to both medial ankles.  Patient notes that she may have obtained this while sleeping as she sleeps with both of her ankles touching each other for long periods of time and rest like this as well.  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  Patient also does not appear to be very well educated and does not comprehend simple tasks, questions, and instructions.  Patient is also showing me her teeth today and saying that she has an infection in her mouth.  Patient also states that she believes left foot infection is due to her big toenail but this was explained to her numerous times that the infection is not the big toenail and its at the medial ankle left.  PMHx:  Past Medical History:  Diagnosis Date   Diabetes mellitus without complication ([place])    Seizures ([place]) 2005 Last sz   childhood febrile sz, then ?eclampsia    Surgical Hx:   Past Surgical History:  Procedure Laterality Date   AMPUTATION TOE Right 03/10/2020   Procedure: AMPUTATION FIFTH RAY WITH IRRIGATION AND DEBRIDEMENT RIGHT FOOT;  Surgeon: Sharlotte Alamo, DPM;  Location: [facility];  Service: Podiatry;  Laterality: Right;   BUBBLE STUDY  07/09/2020   Procedure: BUBBLE STUDY;  Surgeon: Freada Bergeron, MD;  Location: [facility];  Service: Cardiovascular;;   [place] N/A 03/02/2013   Procedure: CESAREAN SECTION;  Surgeon: Florian Buff, MD;  Location: [facility];  Service: Obstetrics;  Laterality: N/A;  Repeat Cesarean Section Delivery Nonviable Baby Girl @ 2158   IRRIGATION AND DEBRIDEMENT FOOT Right 03/15/2020   Procedure: IRRIGATION AND DEBRIDEMENT FOOT;  Surgeon: Sharlotte Alamo, DPM;  Location: [facility];  Service: Podiatry;  Laterality: Right;   LOWER EXTREMITY ANGIOGRAPHY Right 03/13/2020   Procedure: Lower Extremity Angiography;  Surgeon: Katha Cabal, MD;  Location: [facility];  Service: Cardiovascular;  Laterality: Right;   TEE WITHOUT CARDIOVERSION N/A 07/09/2020   Procedure: TRANSESOPHAGEAL ECHOCARDIOGRAM (TEE);  Surgeon: Freada Bergeron, MD;  Location: [facility];  Service: Cardiovascular;  Laterality: N/A;    FHx:  Family History  Problem Relation Age of Onset   Diabetes Mother    Diabetes Father     Social History:  reports that she has been smoking cigarettes. She has been exposed to tobacco smoke. She has never used  smokeless tobacco. She reports that she does not drink alcohol and does not use drugs.  Allergies:  Allergies  Allergen Reactions   Dilantin [Phenytoin Sodium Extended] Other (See Comments)    Causes seizures   Phenobarbital Other (See Comments)    Causes seizures Other reaction(s): Unknown Causes seizures   Phenytoin Sodium Extended     Other reaction(s): Other (See Comments) Causes seizures   Aspartame Diarrhea and Nausea And Vomiting   Aspartame And Phenylalanine Diarrhea and  Nausea And Vomiting   Carbamazepine Other (See Comments)    Other reaction(s): Unknown   Cymbalta [Duloxetine Hcl] Diarrhea   Gabapentin Other (See Comments)    Other reaction(s): Unknown Feels like she is going to have a seizure   Amoxicillin Rash   Ciprofloxacin Rash    blisters   Humalog [Insulin Lispro] Rash   Lantus [Insulin Glargine] Rash   Novolin 70-30 [Insulin Nph Isophane & Regular] Itching and Rash   Penicillins Itching and Rash    Medications Prior to Admission  Medication Sig Dispense Refill   albuterol (VENTOLIN HFA) 108 (90 Base) MCG/ACT inhaler Inhale 1 puff into the lungs every 6 (six) hours as needed for wheezing or shortness of breath.     aspirin EC 81 MG EC tablet Take 1 tablet (81 mg total) by mouth daily. Swallow whole. 30 tablet 1   atorvastatin (LIPITOR) 40 MG tablet Take 40 mg by mouth at bedtime.     DULoxetine (CYMBALTA) 30 MG capsule Take 30 mg by mouth 2 (two) times daily.     ferrous sulfate 325 (65 FE) MG tablet Take 1 tablet (325 mg total) by mouth daily with breakfast. 30 tablet 1   fluticasone (FLONASE) 50 MCG/ACT nasal spray Place 2 sprays into both nostrils daily.     insulin detemir (LEVEMIR) 100 UNIT/ML injection Inject 10 Units into the skin daily. Before bed.     linagliptin (TRADJENTA) 5 MG TABS tablet Take 5 mg by mouth 3 (three) times daily. 2 tabs in morning, 2 tabs at lunch, and 2 tabs at dinner.     lisinopril (ZESTRIL) 2.5 MG tablet Take 2.5 mg by mouth daily.     Multiple Vitamins-Minerals (MULTIVITAMIN WITH MINERALS) tablet Take 1 tablet by mouth daily.     nortriptyline (PAMELOR) 10 MG capsule Take 40 mg by mouth at bedtime.     omeprazole (PRILOSEC) 20 MG capsule Take 20 mg by mouth daily.     zonisamide (ZONEGRAN) 100 MG capsule Take 100 mg by mouth daily.      Physical Exam: General: Alert and oriented.  No apparent distress.  Generally confused.   Vascular: DP/PT pulses palpable bilateral.  Capillary fill time intact to digits  bilateral.  No hair growth noted to digits bilateral.  Decreased redness and swelling noted overall to the left medial ankle but still appears to have some mild fluctuance present around the posterior tibial tendon.  Neuro: Light touch sensation reduced to bilateral lower extremities.  Derm: Small scabs present at the medial aspects of both ankles.  No purulence tomorrow or deep openings noted.  No obvious open ulcerations present to bilateral lower extremities.  Previous scar formation noted to the right lateral foot and plantar arch from previous I&D's and amputations.  MSK: Pain on palpation to the right plantar arch area where there is obvious scar tissue from the previous I&D.  No pain on palpation to the left hallux nail or signs of infection or ingrown toenail to the area.  Mild erythema and edema present around the left medial ankle with mild pain on palpation of this area and along the posterior tibial tendon track.  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  Results for orders placed or performed during the hospital encounter of 12/12/20 (from the past 48 hour(s))  CBG monitoring, ED     Status: Abnormal   Collection Time: 12/12/20  6:42 PM  Result Value Ref Range   Glucose-Capillary 294 (H) 70 - 99 mg/dL    Comment: Glucose reference range applies only to samples taken after fasting for at least 8 hours.  Sedimentation rate     Status: Abnormal   Collection Time: 12/12/20  6:46 PM  Result Value Ref Range   Sed Rate 58 (H) 0 - 20 mm/hr    Comment: Performed at [facility], [address] [place], [place] [address]  Comprehensive metabolic panel     Status: Abnormal   Collection Time: 12/12/20  6:49 PM  Result Value Ref Range   Sodium 140 135 - 145 mmol/L   Potassium 4.6 3.5 - 5.1 mmol/L   Chloride 106 98 - 111 mmol/L   CO2 25 22 - 32 mmol/L   Glucose, Bld 283 (H) 70 - 99 mg/dL    Comment: Glucose reference range applies only to samples taken  after fasting for at least 8 hours.   BUN 26 (H) 6 - 20 mg/dL   Creatinine, Ser 1.46 (H) 0.44 - 1.00 mg/dL   Calcium 9.8 8.9 - 10.3 mg/dL   Total Protein 8.0 6.5 - 8.1 g/dL   Albumin 3.9 3.5 - 5.0 g/dL   AST 18 15 - 41 U/L   ALT 12 0 - 44 U/L   Alkaline Phosphatase 59 38 - 126 U/L   Total Bilirubin 0.7 0.3 - 1.2 mg/dL   GFR, Estimated 45 (L) >60 mL/min    Comment: (NOTE) Calculated using the CKD-EPI Creatinine Equation (2021)    Anion gap 9 5 - 15    Comment: Performed at [facility], [address] [place], [place] [address]  CBC with Differential     Status: Abnormal   Collection Time: 12/12/20  6:49 PM  Result Value Ref Range   WBC 12.3 (H) 4.0 - 10.5 K/uL   RBC 4.97 3.87 - 5.11 MIL/uL   Hemoglobin 13.2 12.0 - 15.0 g/dL   HCT 39.8 36.0 - 46.0 %   MCV 80.1 80.0 - 100.0 fL   MCH 26.6 26.0 - 34.0 pg   MCHC 33.2 30.0 - 36.0 g/dL   RDW 14.7 11.5 - 15.5 %   Platelets 506 (H) 150 - 400 K/uL   nRBC 0.0 0.0 - 0.2 %   Neutrophils Relative % 69 %   Neutro Abs 8.5 (H) 1.7 - 7.7 K/uL   Lymphocytes Relative 25 %   Lymphs Abs 3.1 0.7 - 4.0 K/uL   Monocytes Relative 4 %   Monocytes Absolute 0.5 0.1 - 1.0 K/uL   Eosinophils Relative 1 %   Eosinophils Absolute 0.1 0.0 - 0.5 K/uL   Basophils Relative 1 %   Basophils Absolute 0.1 0.0 - 0.1 K/uL   Immature Granulocytes 0 %   Abs Immature Granulocytes 0.03 0.00 - 0.07 K/uL    Comment: Performed at [facility], [address] [place], [place] [address]  Lactic acid, plasma     Status: None   Collection Time: 12/12/20  9:24 PM  Result Value Ref Range  Lactic Acid, Venous 1.5 0.5 - 1.9 mmol/L    Comment: Performed at [facility], [address] [place], [place] [address]  Blood culture (routine x 2)     Status: None (Preliminary result)   Collection Time: 12/12/20  9:24 PM   Specimen: BLOOD  Result Value Ref Range   Specimen Description BLOOD RIGHT WRIST    Special Requests      BOTTLES DRAWN  AEROBIC AND ANAEROBIC Blood Culture adequate volume   Culture      NO GROWTH < 12 HOURS Performed at [facility], [address] [place], [place] [address]    Report Status PENDING   Blood culture (routine x 2)     Status: None (Preliminary result)   Collection Time: 12/12/20  9:24 PM   Specimen: BLOOD  Result Value Ref Range   Specimen Description BLOOD LEFT ASSIST CONTROL    Special Requests      BOTTLES DRAWN AEROBIC AND ANAEROBIC Blood Culture adequate volume   Culture      NO GROWTH < 12 HOURS Performed at [facility], [address], [place], [place] [address]    Report Status PENDING   SARS CORONAVIRUS 2 (TAT 6-24 HRS) Nasopharyngeal Nasopharyngeal Swab     Status: None   Collection Time: 12/12/20 10:28 PM   Specimen: Nasopharyngeal Swab  Result Value Ref Range   SARS Coronavirus 2 NEGATIVE NEGATIVE    Comment: (NOTE) SARS-CoV-2 target nucleic acids are NOT DETECTED.  The SARS-CoV-2 RNA is generally detectable in upper and lower respiratory specimens during the acute phase of infection. Negative results do not preclude SARS-CoV-2 infection, do not rule out co-infections with other pathogens, and should not be used as the sole basis for treatment or other patient management decisions. Negative results must be combined with clinical observations, patient history, and epidemiological information. The expected result is Negative.  Fact Sheet for Patients: SugarRoll.be  Fact Sheet for Healthcare Providers: https://www.woods-mathews.com/  This test is not yet approved or cleared by the [place] FDA and  has been authorized for detection and/or diagnosis of SARS-CoV-2 by FDA under an Emergency Use Authorization (EUA). This EUA will remain  in effect (meaning this test can be used) for the duration of the COVID-19 declaration under Se ction 564(b)(1) of the Act, 21 U.S.C. section 360bbb-3(b)(1), unless  the authorization is terminated or revoked sooner.  Performed at [facility], [place] [address], [place], [place] [address]   Urinalysis, Complete w Microscopic Nasopharyngeal Swab     Status: Abnormal   Collection Time: 12/12/20 10:29 PM  Result Value Ref Range   Color, Urine AMBER (A) YELLOW    Comment: BIOCHEMICALS MAY BE AFFECTED BY COLOR   APPearance CLOUDY (A) CLEAR   Specific Gravity, Urine 1.026 1.005 - 1.030   pH 5.0 5.0 - 8.0   Glucose, UA 50 (A) NEGATIVE mg/dL   Hgb urine dipstick NEGATIVE NEGATIVE   Bilirubin Urine NEGATIVE NEGATIVE   Ketones, ur 5 (A) NEGATIVE mg/dL   Protein, ur >=300 (A) NEGATIVE mg/dL   Nitrite NEGATIVE NEGATIVE   Leukocytes,Ua MODERATE (A) NEGATIVE   RBC / HPF 6-10 0 - 5 RBC/hpf   WBC, UA 11-20 0 - 5 WBC/hpf   Bacteria, UA RARE (A) NONE SEEN   Squamous Epithelial / LPF 21-50 0 - 5   Mucus PRESENT    Hyaline Casts, UA PRESENT     Comment: Performed at [facility], [address] [place],  [place] [address]  Lactic acid, plasma     Status: None   Collection Time: 12/12/20 11:54 PM  Result Value Ref Range   Lactic Acid, Venous 1.4 0.5 - 1.9 mmol/L    Comment: Performed at [facility], [address] [place], [place] [address]  C-reactive protein     Status: None   Collection Time: 12/12/20 11:54 PM  Result Value Ref Range   CRP 0.5 <1.0 mg/dL    Comment: Performed at [facility] [address] [place], [place] [address]  Hemoglobin A1c     Status: Abnormal   Collection Time: 12/12/20 11:54 PM  Result Value Ref Range   Hgb A1c MFr Bld 10.9 (H) 4.8 - 5.6 %    Comment: (NOTE)         Prediabetes: 5.7 - 6.4         Diabetes: >6.4         Glycemic control for adults with diabetes: <7.0    Mean Plasma Glucose 266 mg/dL    Comment: (NOTE) Performed At: [facility] [place], [place] [id number] Rush Farmer MD FO:[id number]   Glucose, capillary     Status: Abnormal   Collection  Time: 12/13/20  1:07 AM  Result Value Ref Range   Glucose-Capillary 180 (H) 70 - 99 mg/dL    Comment: Glucose reference range applies only to samples taken after fasting for at least 8 hours.   Comment 1 Notify RN   CBC     Status: Abnormal   Collection Time: 12/13/20  4:31 AM  Result Value Ref Range   WBC 10.1 4.0 - 10.5 K/uL   RBC 4.58 3.87 - 5.11 MIL/uL   Hemoglobin 12.1 12.0 - 15.0 g/dL   HCT 36.3 36.0 - 46.0 %   MCV 79.3 (L) 80.0 - 100.0 fL   MCH 26.4 26.0 - 34.0 pg   MCHC 33.3 30.0 - 36.0 g/dL   RDW 15.0 11.5 - 15.5 %   Platelets 431 (H) 150 - 400 K/uL   nRBC 0.0 0.0 - 0.2 %    Comment: Performed at [facility], [address] [place], [place] [address]  Basic metabolic panel     Status: Abnormal   Collection Time: 12/13/20  4:31 AM  Result Value Ref Range   Sodium 139 135 - 145 mmol/L   Potassium 3.9 3.5 - 5.1 mmol/L   Chloride 106 98 - 111 mmol/L   CO2 24 22 - 32 mmol/L   Glucose, Bld 140 (H) 70 - 99 mg/dL    Comment: Glucose reference range applies only to samples taken after fasting for at least 8 hours.   BUN 28 (H) 6 - 20 mg/dL   Creatinine, Ser 1.31 (H) 0.44 - 1.00 mg/dL   Calcium 9.1 8.9 - 10.3 mg/dL   GFR, Estimated 51 (L) >60 mL/min    Comment: (NOTE) Calculated using the CKD-EPI Creatinine Equation (2021)    Anion gap 9 5 - 15    Comment: Performed at [facility], [address] [place], [place] [address]  Glucose, capillary     Status: Abnormal   Collection Time: 12/13/20  7:53 AM  Result Value Ref Range   Glucose-Capillary 186 (H) 70 - 99 mg/dL    Comment: Glucose reference range applies only to samples taken after fasting for at least 8 hours.  Surgical pcr screen     Status: Abnormal   Collection Time: 12/13/20 10:56 AM  Specimen: Nasal Mucosa; Nasal Swab  Result Value Ref Range   MRSA, PCR POSITIVE (A) NEGATIVE    Comment: RESULT CALLED TO, READ BACK BY AND VERIFIED WITH: ANNA RODRIGUEZ AT 7782 ON 12/13/20 JH     Staphylococcus aureus POSITIVE (A) NEGATIVE    Comment: (NOTE) The Xpert SA Assay (FDA approved for NASAL specimens in patients 83 years of age and older), is one component of a comprehensive surveillance program. It is not intended to diagnose infection nor to guide or monitor treatment. Performed at [facility], [address] [place], [place] [address]   Glucose, capillary     Status: Abnormal   Collection Time: 12/13/20 11:18 AM  Result Value Ref Range   Glucose-Capillary 211 (H) 70 - 99 mg/dL    Comment: Glucose reference range applies only to samples taken after fasting for at least 8 hours.  Glucose, capillary     Status: Abnormal   Collection Time: 12/13/20  4:38 PM  Result Value Ref Range   Glucose-Capillary 169 (H) 70 - 99 mg/dL    Comment: Glucose reference range applies only to samples taken after fasting for at least 8 hours.  Glucose, capillary     Status: Abnormal   Collection Time: 12/13/20  9:16 PM  Result Value Ref Range   Glucose-Capillary 153 (H) 70 - 99 mg/dL    Comment: Glucose reference range applies only to samples taken after fasting for at least 8 hours.   Comment 1 Notify RN   CBC     Status: Abnormal   Collection Time: 12/14/20  3:52 AM  Result Value Ref Range   WBC 8.6 4.0 - 10.5 K/uL   RBC 4.42 3.87 - 5.11 MIL/uL   Hemoglobin 11.8 (L) 12.0 - 15.0 g/dL   HCT 36.2 36.0 - 46.0 %   MCV 81.9 80.0 - 100.0 fL   MCH 26.7 26.0 - 34.0 pg   MCHC 32.6 30.0 - 36.0 g/dL   RDW 14.6 11.5 - 15.5 %   Platelets 392 150 - 400 K/uL   nRBC 0.0 0.0 - 0.2 %    Comment: Performed at [facility], [address] [place], [place] [address]  Basic metabolic panel     Status: Abnormal   Collection Time: 12/14/20  3:52 AM  Result Value Ref Range   Sodium 137 135 - 145 mmol/L   Potassium 4.3 3.5 - 5.1 mmol/L   Chloride 104 98 - 111 mmol/L   CO2 28 22 - 32 mmol/L   Glucose, Bld 89 70 - 99 mg/dL    Comment: Glucose reference range applies only to  samples taken after fasting for at least 8 hours.   BUN 27 (H) 6 - 20 mg/dL   Creatinine, Ser 1.38 (H) 0.44 - 1.00 mg/dL   Calcium 8.7 (L) 8.9 - 10.3 mg/dL   GFR, Estimated 48 (L) >60 mL/min    Comment: (NOTE) Calculated using the CKD-EPI Creatinine Equation (2021)    Anion gap 5 5 - 15    Comment: Performed at [facility], [address] [place], [place] [address]  Glucose, capillary     Status: Abnormal   Collection Time: 12/14/20  8:16 AM  Result Value Ref Range   Glucose-Capillary 123 (H) 70 - 99 mg/dL    Comment: Glucose reference range applies only to samples taken after fasting for at least 8 hours.  Glucose, capillary     Status: Abnormal   Collection Time: 12/14/20 10:05 AM  Result Value Ref Range   Glucose-Capillary 146 (H) 70 - 99 mg/dL    Comment: Glucose reference range applies only to samples taken after fasting for at least 8 hours.  Glucose, capillary     Status: Abnormal   Collection Time: 12/14/20 11:52 AM  Result Value Ref Range   Glucose-Capillary 122 (H) 70 - 99 mg/dL    Comment: Glucose reference range applies only to samples taken after fasting for at least 8 hours.   DG Ankle Complete Left  Result Date: 12/12/2020 CLINICAL DATA:  Diabetic wound EXAM: LEFT ANKLE COMPLETE - 3+ VIEW COMPARISON:  None. FINDINGS: Vascular calcifications. Small plantar calcaneal spur. Marked medial soft tissue swelling without emphysema. No frank bony destructive change, there may be slight periosteal reaction at the distal tibia on the medial side. 10 mm osteochondral lesion at the medial talar dome. IMPRESSION: 1. Abundant soft tissue swelling on the medial side of the ankle and lower leg. 2. Nonspecific very subtle periosteal reaction at the tibia without frank bony destructive change. 3. 10 mm osteochondral lesion of the medial talar dome Electronically Signed   By: Donavan Foil M.D.   On: 12/12/2020 22:08   MR ANKLE LEFT WO CONTRAST  Result Date: 12/13/2020 CLINICAL  DATA:  Diabetic patient with a skin wound, soft tissue swelling and pain about the left ankle. EXAM: MRI OF THE LEFT ANKLE WITHOUT CONTRAST TECHNIQUE: Multiplanar, multisequence MR imaging of the ankle was performed. No intravenous contrast was administered. COMPARISON:  Plain films left ankle 12/12/2020. FINDINGS: TENDONS Peroneal: Intact. Posteromedial: The tibialis posterior tendon is completely torn. Proximal tendon stump is approximately 2.5 cm above the tip of the medial malleolus. Distal tendon stump is seen just proximal to the navicular. There is heterogeneously increased T2 signal in the expected location of the tendon along the neck of the talus. The flexor hallucis longus and flexor digitorum longus tendons are intact. Anterior: Intact. Achilles: Intact. Plantar Fascia: Intact.  Negative for plantar fasciitis. LIGAMENTS Lateral: Intact. Medial: Intact. CARTILAGE Ankle Joint: Osteochondral lesion of the medial talar dome measures 1.1 cm transverse by 1.4 cm AP. Fluid undermines subchondral bone and the associated bone fragment is T1 and T2 hypointense consistent with osteonecrosis. No tibiotalar joint effusion. Subtalar Joints/Sinus Tarsi: Negative. Bones: Very mild marrow edema is seen in the medial malleolus. No fracture. Other: There is subcutaneous edema about the medial aspect of the ankle. Heterogeneous fluid collection extending along the medial and posteromedial tibia measures up to 3 cm AP by 1 cm transverse by 7.5 cm craniocaudal is consistent with an abscess. The top of the collection is 5.7 cm above the tibial plafond and it extends inferiorly along the expected tract of the tibialis posterior. IMPRESSION: Heterogeneous fluid collection along the posteromedial distal tibia as described is most consistent with an abscess. Complete tear of the tibialis posterior tendon. Markedly heterogeneous appearance of the expected location of the tendon along the talar neck is worrisome for phlegmon or  abscess. The tear is likely due to septic tenosynovitis. Large osteochondral lesion of the medial talar dome with an unstable and necrotic bone fragment present. Very mild marrow and edema in the medial malleolus could be due to osteomyelitis but is more suggestive of reactive change. Electronically Signed   By: Inge Rise M.D.   On: 12/13/2020 09:35    Blood pressure 116/72, pulse 96, temperature 98.7 F (37.1 C), resp. rate 16, height 5\' 4"  (1.626 m), weight 65.8 kg, SpO2 100 %, unknown if currently  breastfeeding.   Assessment Abscess left foot and ankle with associated cellulitis Complete rupture of left tibialis posterior tendon with potentially septic tenosynovitis Osteochondral defect talus left Chronic pain syndrome Diabetes type 2 polyneuropathy  Plan -Patient seen and examined -MRI report and imaging reviewed and discussed with patient in detail.  Appears to show abscess along the posterior tibial tendon course as well as superficially within the subcutaneous tissue.  Also shows complete tear of the tibialis posterior tendon with possible septic tenosynovitis with large necrotic unstable osteochondral defect of the medial talar dome with some necrosis of bone.  Concern also at the medial aspect of the tibia for some signal changes concerning for potentially acute early stage osteomyelitis. -Clinically does not appear to be all that impressive but does have MRI imaging to support infection to the area. -Believe that it is likely that patient slept on her ankles and cause the ulcerations to form that are present today which ultimately led to a worsening infection of the left medial ankle. -Appreciate infectious disease recommendations.  We will take intraoperative cultures and path specimen. -Patient appears to be fixated on her left big toe and believes that her left toenail caused the infection but once again discussed with patient as to Dr. Cleda Mccreedy that this is not likely as there  are no signs of infection present to the left big toe. -Patient also appears to be suffering from chronic pain to the right foot but there is no further treatment that can be done and no elective surgery should be performed on this patient as she is too unhealthy to undergo any procedures electively in the current state.  Likely will refer patient to pain management on an outpatient basis. -Discussed all treatment options with patient both conservative and surgical attempts at correction at this time patient has elected for surgical procedure consisting of left foot and ankle incision and drainage with removal of all nonviable necrotic tissues including bone, bone biopsy left tibia.  Do not believe that performing any type of posterior tibial tendon repair is advisable at this time due to infection present.  Patient will likely need some sort of supportive ankle device in the future when ambulating.  Patient understands and would like to proceed.  No guarantees given. -Patient has been n.p.o. since this morning.  Plan for surgery at 4 PM today. -Patient will likely have to be nonweightbearing after the procedure for potentially 2 to 3 weeks.  We will put on a posterior splint today after surgery.  Caroline More, DPM 12/14/2020, 12:50 PM

## 2020-12-14 NOTE — Transfer of Care (Signed)
Immediate Anesthesia Transfer of Care Note  Patient: Kristy Mcmillan  Procedure(s) Performed: IRRIGATION AND DEBRIDEMENT FOOT & ANKLE (Left) BONE BIOPSY (Left)  Patient Location: PACU  Anesthesia Type:General  Level of Consciousness: awake  Airway & Oxygen Therapy: Patient Spontanous Breathing and Patient connected to face mask oxygen  Post-op Assessment: Report given to RN and Post -op Vital signs reviewed and stable  Post vital signs: Reviewed and stable  Last Vitals:  Vitals Value Taken Time  BP 122/83   Temp    Pulse 81   Resp 22 12/14/20 1744  SpO2 100   Vitals shown include unvalidated device data.  Last Pain:  Vitals:   12/14/20 1548  TempSrc:   PainSc: 3       Patients Stated Pain Goal: 1 (94/76/54 [id number])  Complications: No notable events documented.
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
## 2020-12-14 NOTE — Plan of Care (Signed)

## 2020-12-14 NOTE — Progress Notes (Signed)
Pt continuing to refuse insulin at this time. Pt also refusing to sign consent until someone comes to look at her teeth; MD notified.

## 2020-12-14 NOTE — Consult Note (Signed)
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 over time and if there is a need for reassessment at some point that can be done.  Her refusal of the insulin though idiosyncratic and not what we are recommending is at least rational from her insistence that it causes extremely unpleasant side effects.  Her blood sugars are not terrible right now with the regimen she is taking.  She admitted that she took up to 5 tablets of Tradjenta a day at home.  I told her  that was significantly more than the recommended dose and she said that she would still rather do that than feel bad from the insulin.  No indication for any psychiatric treatment.  No need of course for hospitalization.  No evidence for need of psychiatric follow-up.  Reconsult if needed.  Disposition: No evidence of imminent risk to self or others at present.   Patient does not meet criteria for psychiatric inpatient admission. Supportive therapy provided about ongoing stressors.  Alethia Berthold, MD 12/14/2020 2:53 PM

## 2020-12-14 NOTE — Progress Notes (Addendum)
G2 as part of the Enhanced Recovery After Surgery pathway was unavailable at 430am. Oncoming team will be notified for further instructions from ordering provider.    Pt declined to sign her consent for the procedure indicating that she wanted the dental infection to be addressed first.

## 2020-12-14 NOTE — Anesthesia Preprocedure Evaluation (Signed)
Anesthesia Evaluation  Patient identified by MRN, date of birth, ID band Patient awake    Reviewed: Allergy & Precautions, NPO status , Patient's Chart, lab work & pertinent test results  History of Anesthesia Complications Negative for: history of anesthetic complications  Airway Mallampati: II       Dental  (+) Poor Dentition, Missing, Chipped, Loose   Pulmonary neg sleep apnea, neg COPD, Current Smoker and Patient abstained from smoking.,           Cardiovascular (-) hypertension(-) Past MI and (-) CHF (-) dysrhythmias (-) Valvular Problems/Murmurs     Neuro/Psych Seizures - (pt denies),  CVA (R sided weakness, still with R leg weakness), Residual Symptoms    GI/Hepatic GERD  Medicated and Controlled,  Endo/Other  diabetes, Type 2, Oral Hypoglycemic Agents  Renal/GU Renal InsufficiencyRenal disease     Musculoskeletal   Abdominal   Peds  Hematology   Anesthesia Other Findings   Reproductive/Obstetrics                             Anesthesia Physical Anesthesia Plan  ASA: 3  Anesthesia Plan: General   Post-op Pain Management:    Induction: Intravenous  PONV Risk Score and Plan: 2 and Ondansetron and Dexamethasone  Airway Management Planned: LMA  Additional Equipment:   Intra-op Plan:   Post-operative Plan:   Informed Consent: I have reviewed the patients History and Physical, chart, labs and discussed the procedure including the risks, benefits and alternatives for the proposed anesthesia with the patient or authorized representative who has indicated his/her understanding and acceptance.       Plan Discussed with:   Anesthesia Plan Comments:         Anesthesia Quick Evaluation

## 2020-12-14 NOTE — Op Note (Signed)
PODIATRY / FOOT AND ANKLE SURGERY OPERATIVE REPORT    SURGEON: Caroline More, DPM  PRE-OPERATIVE DIAGNOSIS:  1.  Left medial ankle/foot abscess 2.  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  Left infective tenosynovitis and tendon rupture of the posterior tibial tendon 4.  Diabetes type 2 polyneuropathy 5.  PVD 6.  History of smoking 7.  Chronic pain syndrome  POST-OPERATIVE DIAGNOSIS: Same  PROCEDURE(S): Left medial ankle/foot incision and drainage with removal of all nonviable necrotic tissues Bone biopsy left distal medial tibia  HEMOSTASIS: Left thigh tourniquet  ANESTHESIA: general  ESTIMATED BLOOD LOSS: 20 cc  FINDING(S): 1.  Extreme tenosynovitis present about the left medial ankle directly underneath the flexor retinaculum and within the posterior tibial tendon sheath, no obvious signs of infection present but it appeared to be grossly inflamed likely due to the posterior tibial tendon rupture. 2.  The posterior tibial tendon appeared to be retracted to the level of the ankle joint from its insertion point at the navicular tuberosity.  PATHOLOGY/SPECIMEN(S): Left distal tibia bone culture and pathology specimen; left infective tenosynovitis culture and path specimen; wound/abscess culture  INDICATIONS:   Kristy Mcmillan is a 45 y.o. female who presents with a left medial ankle ulceration that appears to be scabbed over callused over today.  Patient was seen by Dr. Cleda Mccreedy in office and noted to have increased redness and swelling about the left medial ankle.  Patient was placed on an antibiotic but began having worsening symptoms so went to the emergency room for further evaluation.  X-ray imaging was negative for any acute bone infection but did have some slight periosteal reaction at the medial distal tibia around the medial malleolus and the area of the ulcerated area concerning for potential acute osteomyelitis.  MRI imaging was performed showing a potential abscess and  complete tear of the posterior tibial tendon and potentially infected tenosynovitis of the posterior tibial tendon sheath.  Concerning for also osteomyelitis of the distal tibia over the medial malleolus and the area of the ulceration.  All treatment options were discussed with the patient both conservative and surgical attempts at correction include potential risks and complications at this time patient has elected for surgical procedure consisting of left medial ankle incision and drainage with removal of all nonviable necrotic tissues, bone biopsy of the distal tibia.  No guarantees given.  Consent signed and placed in chart.  Discussed also posterior tibial tendon rupture but this will likely not be able to be repaired especially in the setting of infection do not want to leave any deeper retained suture which could lead to worsening infections.  Discussed in detail with patient.  Patient agreeable to procedure.  DESCRIPTION: After obtaining full informed written consent, the patient was brought back to the operating room and placed supine upon the operating table.  The patient received IV antibiotics prior to induction.  After obtaining adequate anesthesia, 20 cc of half percent Marcaine plain was infiltrated about the tarsal tunnel and medial ankle slightly proximal to the area of the incision site.  A pneumatic thigh tourniquet was placed about the patient's left thigh.  The patient was prepped and draped in the standard fashion.  An Esmarch bandage used to exsanguinate the left lower extremity pneumatic thigh tourniquet was inflated.  Attention was then directed to the posterior tibial tendon course where a linear longitudinal incision was made slightly proximal to the navicular tuberosity and extended along the course the posterior tibial tendon in the area of fluctuance  about the medial ankle and distal leg.  The incision was made in such a way to ellipse with a 3-1 incision around the area of the  ulceration.  The skin ulcer was ellipsed and passed off the operative site.  The incision was deepened to the subcutaneous tissues utilizing sharp and blunt dissection and care was taken to identify and retract all vital neurovascular structures no venous contributories were cauterized as necessary.  No purulence was noted within the superficial subcutaneous tissue.  The flexor retinaculum was identified and transected and a large amount of tenosynovitis type fluid was released from this area that appeared to be fatty and inflamed, no discrete purulence present.  The posterior tibial tendon sheath was also identified and transected at the level of the operative site.  A large amount of tenosynovitis was also released from this area that appeared to be fatty and inflamed with some vascular type tissue.  This tenosynovitis tissue was passed off in the operative site and sent off to pathology and a culture was taken as well.  A culture was also taken from the area where the wound was that appeared to probe fairly close to bone at the medial distal tibia.  The posterior tibial tendon appeared to be completely ruptured and at the insertion point near the navicular tuberosity there did not appear to be any remnant tissue available for potential repair of the posterior tibial tendon.  The tendon appeared to be retracted and was posterior to the ankle joint.  All synovitic tissue was resected and passed off in the operative site.  Tissues were explored further around the tarsal tunnel, the flexor digitorum longus tendon was identified and a mild amount of synovitis was also removed in this area as well.  No palpable fluctuance noted and no pus seen at this point.  The surgical site was flushed with copious amounts normal sterile saline.  In the area where the ulcerated lesion was excised it did appear to probe fairly close to bone so at this time a Jamshidi needle was then used to take 1 biopsy from the area of the  ulceration in the distal medial tibia.  This bony plug was removed and cut into 2 sections and 1 section was sent off for a bone culture and the other section was sent off to pathology.  The surgical site was once again flushed with copious amounts normal sterile saline.  At this time vancomycin powder was then placed into the operative site in all areas.  The posterior tibial tendon sheath was then reapproximated well coapted with 3-0 Vicryl along with the flexor retinaculum.  The subcutaneous tissues and reapproximated well coapted with 4-0 Vicryl.  The skin was then reapproximated well coapted with 4-0 nylon combination of horizontal mattress and simple type stitching.  The pneumatic thigh tourniquet was deflated and a prompt hyperemic response was noted all digits of the left foot.  A postoperative dressing was then applied consisting of Xeroform followed by 4 x 4 gauze, ABD, Kerlix, web roll, posterior splint, Ace wrap's.  Patient tolerated the procedure and anesthesia well was transferred to recovery room vital signs stable vascular status intact all toes left foot.  Patient will be discharged back to the inpatient room with the appropriate follow-up orders and instructions.  We will follow-up with patient again tomorrow.  Do not plan on performing any further dressing change until on an outpatient basis as there does not appear to be any signs of infection present during the  procedure it appeared mainly to be tenosynovitis from the posterior tibial tendon rupture that appeared to be fairly significant and severe.  No obvious signs of infection during procedure today.  COMPLICATIONS: None  CONDITION: Good, stable  Caroline More, DPM

## 2020-12-14 NOTE — Progress Notes (Signed)
PROGRESS NOTE    CAMBRYN CHARTERS  QAS:[id number] DOB: Jun 29, 1976 DOA: 12/12/2020 PCP: [facility] [place]   Brief Narrative: Taken from H&P. Kristy Mcmillan is a 45 y.o. 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 She was seen by her podiatrist 1 day prior to her admission for these concerns. MRI with some concern of osteomyelitis and bone necrosis along with an abscess formation which needs drainage.  Podiatry was consulted and patient will most likely be going to the OR today for incision, drainage and debridement.  Patient is now refusing to sign consent, as she has her own mind for other medications not she wants to take care of her tooth stating that infection is going on for years before touching her leg.  Tried explaining multiple time, in between she started crying. We will ask psych to help to determine any underlying psych issues versus capacity issues, it looks like she does not have any understanding of her medical illness.  Unable to reach any family member at this time.  Subjective: Patient was seen and examined today.  Intermittently screaming and crying stating that you have to pull my tooth out first as it was bothering her for more than a year now before we can do anything with her leg, tried explaining multiple times that she is having a bad infection in leg and can lose her leg if cannot take in care, she has 0 understanding of her medical issues, refusing most of the medications as she has her own mind.  Assessment & Plan:   Principal Problem:   Cellulitis of left foot Active Problems:   GERD (gastroesophageal  reflux disease)   Seizure disorder (HCC)   Neuropathy due to secondary diabetes mellitus ([place])   Stroke (cerebrum) (HCC)   CKD stage 3 due to type 1 diabetes mellitus (HCC)  Cellulitis of left ankle/osteomyelitis/abscess.  Podiatry was consulted and she will be going to the OR for debridement today, if she is sign her consent or we were able to address her capacity. -Psych was consulted-we will appreciate their help -Continue with Ancef -Continue with supportive care and pain management  Diabetes mellitus with renal complications.  CBG elevated and A1c of 10.9 but patient is refusing insulin stating that it makes her sick.  She was asking to continue home dose of Tradjenta which she was taking it 3 times a day, maximum dose is 5 mg daily. -Tradjenta 5 mg daily -Add Amaryl -Diabetes coordinator for education as patient has very poor insight of her disease.  Seizure disorder.  No acute concerns. -Continue home dose of zonisamide  GERD. -Continue Protonix  Depression.  No acute concern. -Continue home dose of Cymbalta and nortriptyline  Concern of UTI.  UA with protein and pyuria.  Patient is asymptomatic and apparently urine cultures get lost? -Continue with Ancef for left ankle cellulitis and osteomyelitis. -No need for any specific antibiotics for UTI at this time or to repeat urine culture.  Objective: Vitals:   12/13/20 2335 12/14/20 0405 12/14/20 0759 12/14/20 1117  BP: 107/71 102/67 111/65 116/72  Pulse: 84 76 97 96  Resp: 16 16 16 16   Temp:  97.9 F (36.6 C) 98.3 F (36.8 C) 97.7 F (36.5 C) 98.7 F (37.1 C)  TempSrc: Oral Oral Oral   SpO2: 100% 99% 100% 100%  Weight:      Height:       No intake or output data in the 24 hours ending 12/14/20 1410  Filed Weights   12/12/20 1833  Weight: 65.8 kg    Examination:  General.  Well-developed lady, seems likely to have some cognitive impairment ,in no acute distress. Pulmonary.  Lungs clear bilaterally, normal  respiratory effort. CV.  Regular rate and rhythm, no JVD, rub or murmur. Abdomen.  Soft, nontender, nondistended, BS positive. CNS.  Alert and oriented x3.  No focal neurologic deficit. Extremities.  Left medial malleolus edema, no cyanosis, pulses intact and symmetrical. Psychiatry.  Judgment and insight appears impaired.   DVT prophylaxis: Lovenox Code Status: Full Family Communication: Discussed with patient, unable to reach any family member, only husband is listed in her chart and those numbers are nonfunctional. Disposition Plan:  Status is: Inpatient  Remains inpatient appropriate because:Inpatient level of care appropriate due to severity of illness  Dispo: The patient is from: Home              Anticipated d/c is to: Home              Patient currently is not medically stable to d/c.   Difficult to place patient No               Level of care: Med-Surg  All the records are reviewed and case discussed with Care Management/Social Worker. Management plans discussed with the patient, nursing and they are in agreement.  Consultants:  Podiatry  Procedures:  Antimicrobials:  Ancef  Data Reviewed: I have personally reviewed following labs and imaging studies  CBC: Recent Labs  Lab 12/12/20 1849 12/13/20 0431 12/14/20 0352  WBC 12.3* 10.1 8.6  NEUTROABS 8.5*  --   --   HGB 13.2 12.1 11.8*  HCT 39.8 36.3 36.2  MCV 80.1 79.3* 81.9  PLT 506* 431* 250    Basic Metabolic Panel: Recent Labs  Lab 12/12/20 1849 12/13/20 0431 12/14/20 0352  NA 140 139 137  K 4.6 3.9 4.3  CL 106 106 104  CO2 25 24 28   GLUCOSE 283* 140* 89  BUN 26* 28* 27*  CREATININE 1.46* 1.31* 1.38*  CALCIUM 9.8 9.1 8.7*    GFR: Estimated Creatinine Clearance: 48 mL/min (A) (by C-G formula based on SCr of 1.38 mg/dL (H)). Liver Function Tests: Recent Labs  Lab 12/12/20 1849  AST 18  ALT 12  ALKPHOS 59  BILITOT 0.7  PROT 8.0  ALBUMIN 3.9    No results for input(s): LIPASE, AMYLASE  in the last 168 hours. No results for input(s): AMMONIA in the last 168 hours. Coagulation Profile: No results for input(s): INR, PROTIME in the last 168 hours. Cardiac Enzymes: No results for input(s): CKTOTAL, CKMB, CKMBINDEX, TROPONINI in the last 168 hours. BNP (last 3 results) No results for input(s): PROBNP in the last 8760 hours. HbA1C: Recent Labs    12/12/20 2354  HGBA1C 10.9*   CBG: Recent Labs  Lab 12/13/20 1638 12/13/20 2116 12/14/20 0816 12/14/20 1005 12/14/20 1152  GLUCAP 169* 153* 123* 146* 122*    Lipid Profile: No results for input(s): CHOL, HDL, LDLCALC, TRIG, CHOLHDL, LDLDIRECT in the last 72 hours. Thyroid Function Tests: No results for input(s): TSH, T4TOTAL, FREET4, T3FREE, THYROIDAB in the last 72 hours. Anemia  Panel: No results for input(s): VITAMINB12, FOLATE, FERRITIN, TIBC, IRON, RETICCTPCT in the last 72 hours. Sepsis Labs: Recent Labs  Lab 12/12/20 2124 12/12/20 2354  LATICACIDVEN 1.5 1.4     Recent Results (from the past 240 hour(s))  Blood culture (routine x 2)     Status: None (Preliminary result)   Collection Time: 12/12/20  9:24 PM   Specimen: BLOOD  Result Value Ref Range Status   Specimen Description BLOOD RIGHT WRIST  Final   Special Requests   Final    BOTTLES DRAWN AEROBIC AND ANAEROBIC Blood Culture adequate volume   Culture   Final    NO GROWTH < 12 HOURS Performed at [facility], [address] [place], [place] [address]    Report Status PENDING  Incomplete  Blood culture (routine x 2)     Status: None (Preliminary result)   Collection Time: 12/12/20  9:24 PM   Specimen: BLOOD  Result Value Ref Range Status   Specimen Description BLOOD LEFT ASSIST CONTROL  Final   Special Requests   Final    BOTTLES DRAWN AEROBIC AND ANAEROBIC Blood Culture adequate volume   Culture   Final    NO GROWTH < 12 HOURS Performed at [facility], [address], [place], [place] [address]    Report Status PENDING   Incomplete  SARS CORONAVIRUS 2 (TAT 6-24 HRS) Nasopharyngeal Nasopharyngeal Swab     Status: None   Collection Time: 12/12/20 10:28 PM   Specimen: Nasopharyngeal Swab  Result Value Ref Range Status   SARS Coronavirus 2 NEGATIVE NEGATIVE Final    Comment: (NOTE) SARS-CoV-2 target nucleic acids are NOT DETECTED.  The SARS-CoV-2 RNA is generally detectable in upper and lower respiratory specimens during the acute phase of infection. Negative results do not preclude SARS-CoV-2 infection, do not rule out co-infections with other pathogens, and should not be used as the sole basis for treatment or other patient management decisions. Negative results must be combined with clinical observations, patient history, and epidemiological information. The expected result is Negative.  Fact Sheet for Patients: SugarRoll.be  Fact Sheet for Healthcare Providers: https://www.woods-mathews.com/  This test is not yet approved or cleared by the [place] FDA and  has been authorized for detection and/or diagnosis of SARS-CoV-2 by FDA under an Emergency Use Authorization (EUA). This EUA will remain  in effect (meaning this test can be used) for the duration of the COVID-19 declaration under Se ction 564(b)(1) of the Act, 21 U.S.C. section 360bbb-3(b)(1), unless the authorization is terminated or revoked sooner.  Performed at [facility], [place] [address] [place], [place] [address]   Surgical pcr screen     Status: Abnormal   Collection Time: 12/13/20 10:56 AM   Specimen: Nasal Mucosa; Nasal Swab  Result Value Ref Range Status   MRSA, PCR POSITIVE (A) NEGATIVE Final    Comment: RESULT CALLED TO, READ BACK BY AND VERIFIED WITH: ANNA RODRIGUEZ AT 5003 ON 12/13/20 JH    Staphylococcus aureus POSITIVE (A) NEGATIVE Final    Comment: (NOTE) The Xpert SA Assay (FDA approved for NASAL specimens in patients 40 years of age and older), is one component  of a comprehensive surveillance program. It is not intended to diagnose infection nor to guide or monitor treatment. Performed at [facility], [address] [place], [place] [address]       Radiology Studies: DG Ankle Complete Left  Result Date: 12/12/2020 CLINICAL DATA:  Diabetic wound EXAM: LEFT ANKLE  COMPLETE - 3+ VIEW COMPARISON:  None. FINDINGS: Vascular calcifications. Small plantar calcaneal spur. Marked medial soft tissue swelling without emphysema. No frank bony destructive change, there may be slight periosteal reaction at the distal tibia on the medial side. 10 mm osteochondral lesion at the medial talar dome. IMPRESSION: 1. Abundant soft tissue swelling on the medial side of the ankle and lower leg. 2. Nonspecific very subtle periosteal reaction at the tibia without frank bony destructive change. 3. 10 mm osteochondral lesion of the medial talar dome Electronically Signed   By: Donavan Foil M.D.   On: 12/12/2020 22:08   MR ANKLE LEFT WO CONTRAST  Result Date: 12/13/2020 CLINICAL DATA:  Diabetic patient with a skin wound, soft tissue swelling and pain about the left ankle. EXAM: MRI OF THE LEFT ANKLE WITHOUT CONTRAST TECHNIQUE: Multiplanar, multisequence MR imaging of the ankle was performed. No intravenous contrast was administered. COMPARISON:  Plain films left ankle 12/12/2020. FINDINGS: TENDONS Peroneal: Intact. Posteromedial: The tibialis posterior tendon is completely torn. Proximal tendon stump is approximately 2.5 cm above the tip of the medial malleolus. Distal tendon stump is seen just proximal to the navicular. There is heterogeneously increased T2 signal in the expected location of the tendon along the neck of the talus. The flexor hallucis longus and flexor digitorum longus tendons are intact. Anterior: Intact. Achilles: Intact. Plantar Fascia: Intact.  Negative for plantar fasciitis. LIGAMENTS Lateral: Intact. Medial: Intact. CARTILAGE Ankle Joint: Osteochondral  lesion of the medial talar dome measures 1.1 cm transverse by 1.4 cm AP. Fluid undermines subchondral bone and the associated bone fragment is T1 and T2 hypointense consistent with osteonecrosis. No tibiotalar joint effusion. Subtalar Joints/Sinus Tarsi: Negative. Bones: Very mild marrow edema is seen in the medial malleolus. No fracture. Other: There is subcutaneous edema about the medial aspect of the ankle. Heterogeneous fluid collection extending along the medial and posteromedial tibia measures up to 3 cm AP by 1 cm transverse by 7.5 cm craniocaudal is consistent with an abscess. The top of the collection is 5.7 cm above the tibial plafond and it extends inferiorly along the expected tract of the tibialis posterior. IMPRESSION: Heterogeneous fluid collection along the posteromedial distal tibia as described is most consistent with an abscess. Complete tear of the tibialis posterior tendon. Markedly heterogeneous appearance of the expected location of the tendon along the talar neck is worrisome for phlegmon or abscess. The tear is likely due to septic tenosynovitis. Large osteochondral lesion of the medial talar dome with an unstable and necrotic bone fragment present. Very mild marrow and edema in the medial malleolus could be due to osteomyelitis but is more suggestive of reactive change. Electronically Signed   By: Inge Rise M.D.   On: 12/13/2020 09:35    Scheduled Meds:  aspirin EC  81 mg Oral Daily   atorvastatin  40 mg Oral QHS   Chlorhexidine Gluconate Cloth  6 each Topical Q0600   DULoxetine  30 mg Oral BID   enoxaparin (LOVENOX) injection  40 mg Subcutaneous Q24H   ferrous sulfate  325 mg Oral Q breakfast   glimepiride  2 mg Oral Q breakfast   insulin aspart  0-15 Units Subcutaneous TID WC   insulin detemir  10 Units Subcutaneous QHS   linagliptin  5 mg Oral Daily   multivitamin with minerals  1 tablet Oral Daily   mupirocin ointment  1 application Nasal BID   nortriptyline  40 mg  Oral QHS   pantoprazole  40 mg Oral  Daily   zonisamide  100 mg Oral Daily   Continuous Infusions:   ceFAZolin (ANCEF) IV 1 g (12/14/20 1338)     LOS: 1 day   Time spent: 45 minutes. More than 50% of the time was spent in counseling/coordination of care  Lorella Nimrod, MD [facility]  If 7PM-7AM, please contact night-coverage [email or web address]  12/14/2020, 2:10 PM   This record has been created using Systems analyst. Errors have been sought and corrected,but may not always be located. Such creation errors do not reflect on the standard of care.

## 2020-12-15 ENCOUNTER — Encounter: Payer: Self-pay | Admitting: Podiatry

## 2020-12-15 DIAGNOSIS — L03116 Cellulitis of left lower limb: Secondary | ICD-10-CM | POA: Diagnosis not present

## 2020-12-15 LAB — GLUCOSE, CAPILLARY
Glucose-Capillary: 211 mg/dL — ABNORMAL HIGH (ref 70–99)
Glucose-Capillary: 134 mg/dL — ABNORMAL HIGH (ref 70–99)
Glucose-Capillary: 102 mg/dL — ABNORMAL HIGH (ref 70–99)
Glucose-Capillary: 150 mg/dL — ABNORMAL HIGH (ref 70–99)

## 2020-12-15 LAB — CK: Total CK: 35 U/L — ABNORMAL LOW (ref 38–234)

## 2020-12-15 MED ORDER — SODIUM CHLORIDE 0.9 % IV SOLN
6.0000 mg/kg | Freq: Every day | INTRAVENOUS | Status: DC
  Administered 2020-12-15 (×2): 400 mg via INTRAVENOUS
  Filled 2020-12-15 (×3): qty 8

## 2020-12-15 MED ORDER — SODIUM CHLORIDE 0.9 % IV SOLN
1.0000 g | INTRAVENOUS | Status: DC
  Administered 2020-12-15: 1 g via INTRAVENOUS
  Filled 2020-12-15: qty 10
  Filled 2020-12-15: qty 1
  Filled 2020-12-15: qty 10

## 2020-12-15 NOTE — Discharge Instructions (Signed)
Podiatry discharge instructions: 1.  Remain nonweightbearing at all times to left lower extremity for the next 2 to 3 weeks until your incision heals 2.  Keep surgical dressings and splint to left lower extremity clean, dry, and intact, do not get the dressings wet. 3.  Recommend taking aspirin 81 mg twice daily.  Also work on knee flexion and extension exercises daily as well as calf massages. 4.  Take antibiotics as prescribed until gone 5.  Please call clinic for making a follow-up appointment within 1 week of your discharge date from the hospital.

## 2020-12-15 NOTE — Plan of Care (Signed)
Patient had an uneventful shift. No changes in neurological and neurovascular assessments. Pain controlled with PRN medications. Vital signs within normal range.Denies any needs at this time. All Safety measures maintained. Care continues.  Problem: Education: Goal: Knowledge of General Education information will improve Description: Including pain rating scale, medication(s)/side effects and non-pharmacologic comfort measures Outcome: Progressing   Problem: Health Behavior/Discharge Planning: Goal: Ability to manage health-related needs will improve Outcome: Progressing   Problem: Clinical Measurements: Goal: Ability to maintain clinical measurements within normal limits will improve Outcome: Progressing Goal: Will remain free from infection Outcome: Progressing Goal: Diagnostic test results will improve Outcome: Progressing Goal: Respiratory complications will improve Outcome: Progressing Goal: Cardiovascular complication will be avoided Outcome: Progressing
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
## 2020-12-15 NOTE — Evaluation (Signed)
Physical Therapy Evaluation Patient Details Name: Kristy Mcmillan MRN: [id number] DOB: 1975/09/24 Today's Date: 12/15/2020   History of Present Illness  Pt is a 45 y/o F admitted on 12/12/20 with c/c of foot pain. Pt found to have abscess & complete tear of tibialis posterior tendon. Pt underwent L foot I&D with removal of all nonviable & necrotic tissue & bone biopsy of L distal medial tibia on 12/14/20. Pt is now NWB LLE. PMH: DM with complications of CKD stage 3, seizure disorder, CVA with R hemiparesis  Clinical Impression  Pt seen for PT evaluation with PT educating pt on NWB LLE & techniques for mobility to maintain precautions. Pt is able to complete bed mobility with mod I but requires up to mod/max assist for transfers. Pt attempts to ambulate but requires max assist with pt unable to maintain NWB LLE & demonstrating decreased awareness of use of AD. Pt performs LE strengthening exercises with instructional cuing for technique but would benefit from further education. PT educates pt on recommendation of STR upon d/c from acute setting to maximize independence & safety with mobility but pt very eager to d/c home. Pt would have difficulty returning home and if she did she would likely require non emergent EMS transport home as she has 5 steps to enter her house (unless her spouse is able to bump her up 5 steps in a w/c). Will continue to follow pt acutely to progress mobility as able.     Follow Up Recommendations SNF    Equipment Recommendations  Wheelchair (measurements PT);Wheelchair cushion (measurements PT);Rolling walker with 5" wheels    Recommendations for Other Services       Precautions / Restrictions Precautions Precautions: Fall Restrictions Weight Bearing Restrictions: Yes LLE Weight Bearing: Non weight bearing      Mobility  Bed Mobility Overal bed mobility: Modified Independent             General bed mobility comments: HOB significantly elevated to transition  to sitting EOB    Transfers Overall transfer level: Needs assistance Equipment used: Rolling walker (2 wheeled) Transfers: Sit to/from [facility] Sit to Stand: Mod assist Stand pivot transfers: Mod assist;Max assist       General transfer comment: cuing for safe hand placement & technique for NWB LLE  Ambulation/Gait Ambulation/Gait assistance: Max assist Gait Distance (Feet): 2 Feet (2 ft forwards + 2 feet backwards) Assistive device: Rolling walker (2 wheeled) Gait Pattern/deviations: Step-to pattern Gait velocity: decreased   General Gait Details: Pt requires max cuing for sequencing & technique, RW placement as pt will push it out too far, difficulty maintaining NWB LLE throughout & weight bears at times.  Stairs            Wheelchair Mobility    Modified Rankin (Stroke Patients Only)       Balance Overall balance assessment: Needs assistance Sitting-balance support: Feet supported;Bilateral upper extremity supported Sitting balance-Leahy Scale: Good Sitting balance - Comments: supervision sitting EOB   Standing balance support: During functional activity;Bilateral upper extremity supported Standing balance-Leahy Scale: Poor                               Pertinent Vitals/Pain Pain Assessment: No/denies pain    Home Living Family/patient expects to be discharged to:: Private residence Living Arrangements: Spouse/significant other (and "girlfriend") Available Help at Discharge: Family (pt reports her husband works part time, "girlfriend" is avaiable to assist 24 hrs/day)  Type of Home: Mobile home Home Access: Stairs to enter Entrance Stairs-Rails: Left (ascending) Entrance Stairs-Number of Steps: 5 Home Layout: One level Home Equipment: Bedside commode (rollator) Additional Comments: Pt reports she has put a regular chair in her shower to use    Prior Function Level of Independence: Needs assistance         Comments:  Pt reports she can ambulate without assistance with rollator, but when attempting to ambulate without AD she requires assistance from her husband. Pt reports she needs PRN assistance for transfers, assistance for tub transfers. Pt with hx of R hemiplegia earlier this year, but reports her husband helped her learn to walk again at home.     Hand Dominance   Dominant Hand: Left    Extremity/Trunk Assessment   Upper Extremity Assessment Upper Extremity Assessment: Overall WFL for tasks assessed    Lower Extremity Assessment Lower Extremity Assessment:  (LLE not tested 2/2 splint, RLE grossly 3+/5 as no buckling noted in standing with BUE support on RW)       Communication      Cognition Arousal/Alertness: Awake/alert Behavior During Therapy: WFL for tasks assessed/performed Overall Cognitive Status: Within Functional Limits for tasks assessed                                 General Comments: Pt AxO x 4 but appears to have decreased understanding of situation. Please see chart for additional information. Able to follow all commands & verbalizes understanding of education given during session.      General Comments      Exercises General Exercises - Lower Extremity Long Arc Quad: AROM;Strengthening;Left;15 reps;Seated Hip ABduction/ADduction: AROM;Strengthening;Both;15 reps;Seated (PT provides MAX cuing for hip adduction pillow squeezes with poor return demo by pt) Hip Flexion/Marching: AROM;Strengthening;Left;15 reps;Seated   Assessment/Plan    PT Assessment Patient needs continued PT services  PT Problem List         PT Treatment Interventions DME instruction;Therapeutic exercise;Wheelchair mobility training;Gait training;Balance training;Stair training;Neuromuscular re-education;Functional mobility training;Modalities;Therapeutic activities;Patient/family education    PT Goals (Current goals can be found in the Care Plan section)  Acute Rehab PT  Goals Patient Stated Goal: go home PT Goal Formulation: With patient Time For Goal Achievement: 12/29/20 Potential to Achieve Goals: Fair    Frequency 7X/week   Barriers to discharge Decreased caregiver support;Inaccessible home environment multiple steps to access mobile home    Co-evaluation               AM-PAC PT "6 Clicks" Mobility  Outcome Measure Help needed turning from your back to your side while in a flat bed without using bedrails?: None Help needed moving from lying on your back to sitting on the side of a flat bed without using bedrails?: None Help needed moving to and from a bed to a chair (including a wheelchair)?: A Lot Help needed standing up from a chair using your arms (e.g., wheelchair or bedside chair)?: A Lot Help needed to walk in hospital room?: A Lot Help needed climbing 3-5 steps with a railing? : Total 6 Click Score: 15    End of Session Equipment Utilized During Treatment: Gait belt Activity Tolerance: Patient tolerated treatment well Patient left: in chair;with call bell/phone within reach;with chair alarm set Nurse Communication: Mobility status PT Visit Diagnosis: Unsteadiness on feet (R26.81);Difficulty in walking, not elsewhere classified (R26.2);Muscle weakness (generalized) (M62.81)    Time: 8338-2505 PT Time Calculation (min) (  ACUTE ONLY): 23 min   Charges:   PT Evaluation $PT Eval Low Complexity: 1 Low PT Treatments $Therapeutic Activity: 8-22 mins        Lavone Nian, PT, DPT 12/15/20, 10:30 AM   Waunita Schooner 12/15/2020, 10:27 AM

## 2020-12-15 NOTE — Progress Notes (Signed)
Patient refusing novolog, cymbalta, and zonegran. MD Amin aware.
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
## 2020-12-16 DIAGNOSIS — F4325 Adjustment disorder with mixed disturbance of emotions and conduct: Secondary | ICD-10-CM | POA: Diagnosis not present

## 2020-12-16 LAB — CBC WITH DIFFERENTIAL/PLATELET
Abs Immature Granulocytes: 0.03 10*3/uL (ref 0.00–0.07)
Basophils Absolute: 0 10*3/uL (ref 0.0–0.1)
Basophils Relative: 0 %
Eosinophils Absolute: 0.1 10*3/uL (ref 0.0–0.5)
Eosinophils Relative: 2 %
HCT: 32.7 % — ABNORMAL LOW (ref 36.0–46.0)
Hemoglobin: 11.2 g/dL — ABNORMAL LOW (ref 12.0–15.0)
Immature Granulocytes: 0 %
Lymphocytes Relative: 59 %
Lymphs Abs: 5.5 10*3/uL — ABNORMAL HIGH (ref 0.7–4.0)
MCH: 27.2 pg (ref 26.0–34.0)
MCHC: 34.3 g/dL (ref 30.0–36.0)
MCV: 79.4 fL — ABNORMAL LOW (ref 80.0–100.0)
Monocytes Absolute: 0.4 10*3/uL (ref 0.1–1.0)
Monocytes Relative: 4 %
Neutro Abs: 3.2 10*3/uL (ref 1.7–7.7)
Neutrophils Relative %: 35 %
Platelets: 373 10*3/uL (ref 150–400)
RBC: 4.12 MIL/uL (ref 3.87–5.11)
RDW: 14.3 % (ref 11.5–15.5)
WBC: 9.4 10*3/uL (ref 4.0–10.5)
nRBC: 0 % (ref 0.0–0.2)

## 2020-12-16 LAB — COMPREHENSIVE METABOLIC PANEL
ALT: 8 U/L (ref 0–44)
AST: 14 U/L — ABNORMAL LOW (ref 15–41)
Albumin: 3.3 g/dL — ABNORMAL LOW (ref 3.5–5.0)
Alkaline Phosphatase: 51 U/L (ref 38–126)
Anion gap: 7 (ref 5–15)
BUN: 25 mg/dL — ABNORMAL HIGH (ref 6–20)
CO2: 27 mmol/L (ref 22–32)
Calcium: 8.6 mg/dL — ABNORMAL LOW (ref 8.9–10.3)
Chloride: 103 mmol/L (ref 98–111)
Creatinine, Ser: 0.8 mg/dL (ref 0.44–1.00)
GFR, Estimated: >60 mL/min (ref >60)
Glucose, Bld: 147 mg/dL — ABNORMAL HIGH (ref 70–99)
Potassium: 4.1 mmol/L (ref 3.5–5.1)
Sodium: 137 mmol/L (ref 135–145)
Total Bilirubin: 0.5 mg/dL (ref 0.3–1.2)
Total Protein: 6.6 g/dL (ref 6.5–8.1)

## 2020-12-16 LAB — GLUCOSE, CAPILLARY
Glucose-Capillary: 161 mg/dL — ABNORMAL HIGH (ref 70–99)
Glucose-Capillary: 190 mg/dL — ABNORMAL HIGH (ref 70–99)

## 2020-12-16 MED ORDER — GLIMEPIRIDE 2 MG PO TABS: 2.0000 mg | ORAL_TABLET | Freq: Every day | ORAL | 1 refills | Status: AC

## 2020-12-16 MED ORDER — DOXYCYCLINE HYCLATE 100 MG PO TABS: 100.0000 mg | ORAL_TABLET | Freq: Two times a day (BID) | ORAL | 0 refills | Status: AC

## 2020-12-16 NOTE — Plan of Care (Signed)
Patient discharged home per MD orders at this time.All discharge instructions,education and medications reviewed with patient at bedside.patient expressed understanding and will comply with dc instructions.follow up appointments also communicated to patient.no verbal c/o or any ssx of distress.Pt discharged home with HH/PT services.patient was transported home by a commercial transportation car.

## 2020-12-16 NOTE — TOC Transition Note (Signed)
Transition of Care [facility]) - CM/SW Discharge Note   Patient Details  Name: Kristy Mcmillan MRN: [id number] Date of Birth: 02-02-1976  [facility]) CM/SW Contact:  Harriet Masson, RN Phone Number: [phone] 12/16/2020, 12:16 PM   Clinical Narrative:    Spoke with pt and provider. Pt has opt to decline SNF and requested [facility] for HHPT/OT and RN for ongoing wound care to her LLL.  Nursing will visit sooner however PT/OT later in the week with delayed services. Pt and team are aware. Spoke with Corene Cornea via [facility] (accepted) and Jermaine ([employer]) with the request for WC and 3-1 commode.  Pt lives in a mobile home and states she may need a voucher but has plenty of family to assist her getting into the home to a safe environment.  Pt states she is able to obtain all her medication from her local pharmacy ([facility]) and transports services provide her with this service to all her medical appointments with a 3 day call-in. In addition to family members.  TOC team will continue to follow.   Final next level of care: Home w Home Health Services Barriers to Discharge: Barriers Resolved   Patient Goals and CMS Choice     Choice offered to / list presented to : Patient  Discharge Placement                    Patient and family notified of of transfer: 12/16/20 (pt is notifying her spouse directly)  Discharge Plan and Services                DME Arranged: Wheelchair manual, 3-N-1 DME Agency:  [facility]) Date DME Agency Contacted: 12/16/20 Time DME Agency Contacted: 1215 Representative spoke with at DME Agency: [facility]on Grills HH Arranged: RN, PT, OT [place] Agency: [place] ([place]) Date [place]: 12/16/20 Time [place]: 1215 Representative spoke with at [place]: [place] ([place]) Interventions     Readmission Risk Interventions No flowsheet data found.

## 2020-12-16 NOTE — Progress Notes (Addendum)
Physical Therapy Treatment Patient Details Name: Kristy Mcmillan MRN: [id number] DOB: [birth year] Today's Date: 12/16/2020    History of Present Illness Pt is a 45 y/o F admitted on 12/12/20 with c/c of foot pain. Pt found to have abscess & complete tear of tibialis posterior tendon. Pt underwent L foot I&D with removal of all nonviable & necrotic tissue & bone biopsy of L distal medial tibia on 12/14/20. Pt is now NWB LLE. PMH: DM with complications of CKD stage 3, seizure disorder, CVA with R hemiparesis    PT Comments    Pt seen for PT tx with nurse in room administering meds & pt frequently speaking to PT about various medications with PT deferring medication questions to MD. Pt endorses 10/10 migraine HA but is agreeable to participation. Provided pt with supine HEP handout & pt performs LLE strengthening exercises with instructional cuing for technique. Pt reports need to void & ambulates bed>bathroom>recliner with RW & min/mod assist with pt demonstrating some improvement in RW management but with decreased ability to maintain NWB LLE and decreased foot clearance RLE, at times dragging RLE along. Pt would benefit from STR upon d/c to maximize independence & decrease fall risk prior to return home but pt strongly wishes to return home. Discussed non emergent EMS transport home vs bumping up steps on buttocks vs caregiver bumping pt up steps in w/c with pt reporting she feels her husband can bump her up steps in w/c. Pt also inquiring about electric scooter with PT educating her that is usually not indicated with temporary weight bearing precautions. Will continue to follow pt acutely to progress gait & stairs.      Follow Up Recommendations  SNF     Equipment Recommendations  Wheelchair (measurements PT);Wheelchair cushion (measurements PT);Rolling walker with 5" wheels, 3-in-1 BSC   Recommendations for Other Services       Precautions / Restrictions Precautions Precautions:  Fall Restrictions Weight Bearing Restrictions: Yes LLE Weight Bearing: Non weight bearing    Mobility  Bed Mobility Overal bed mobility: Modified Independent             General bed mobility comments: supine>sit with HOB significantly elevated    Transfers Overall transfer level: Needs assistance Equipment used: Rolling walker (2 wheeled) Transfers: Sit to/from Stand Sit to Stand: Min guard;From elevated surface         General transfer comment: cuing but poor demo of safe hand placement as pt pushes to stand with BUE on RW  Ambulation/Gait Ambulation/Gait assistance: Mod assist Gait Distance (Feet): 7 Feet (+ 15 ft) Assistive device: Rolling walker (2 wheeled) Gait Pattern/deviations: Decreased step length - right;Step-to pattern;Decreased dorsiflexion - right Gait velocity: decreased   General Gait Details: Pt with some improvement in RW positioning, but continues to demonstrate decreased ability to maintain NWB LLE with pt intermittently stepping on LLE; pt with decreased ability to swing RLE forward & clear floor, instead dragging RLE to advance it at times.   Stairs             Wheelchair Mobility    Modified Rankin (Stroke Patients Only)       Balance Overall balance assessment: Needs assistance Sitting-balance support: Feet supported;Bilateral upper extremity supported Sitting balance-Leahy Scale: Good     Standing balance support: During functional activity;Bilateral upper extremity supported Standing balance-Leahy Scale: Poor Standing balance comment: requires BUE support on RW for NWB LLE  Cognition Arousal/Alertness: Awake/alert Behavior During Therapy: WFL for tasks assessed/performed Overall Cognitive Status: Within Functional Limits for tasks assessed                                        Exercises General Exercises - Lower Extremity Quad Sets: AROM;Strengthening;Left;10  reps;Supine Gluteal Sets: AROM;Strengthening;Both;10 reps;Supine Short Arc Quad: AROM;Strengthening;Left;10 reps;Supine Heel Slides: AROM;Strengthening;Left;10 reps;Supine Hip ABduction/ADduction: AROM;Strengthening;Left;10 reps;Supine (hip abduction slides x 10 & hip adduction pillow squeezes x 10) Straight Leg Raises: AROM;Strengthening;Left;10 reps;Supine    General Comments        Pertinent Vitals/Pain Pain Assessment: 0-10 Pain Score: 10-Worst pain ever Pain Location: migraine HA on R side, blurry vision Pain Descriptors / Indicators: Headache Pain Intervention(s): RN gave pain meds during session;Limited activity within patient's tolerance;Monitored during session    Home Living                      Prior Function            PT Goals (current goals can now be found in the care plan section) Acute Rehab PT Goals Patient Stated Goal: go home PT Goal Formulation: With patient Time For Goal Achievement: 12/29/20 Potential to Achieve Goals: Fair Progress towards PT goals: Progressing toward goals    Frequency    7X/week      PT Plan Current plan remains appropriate    Co-evaluation              AM-PAC PT "6 Clicks" Mobility   Outcome Measure  Help needed turning from your back to your side while in a flat bed without using bedrails?: None Help needed moving from lying on your back to sitting on the side of a flat bed without using bedrails?: None Help needed moving to and from a bed to a chair (including a wheelchair)?: A Little Help needed standing up from a chair using your arms (e.g., wheelchair or bedside chair)?: A Little Help needed to walk in hospital room?: A Lot Help needed climbing 3-5 steps with a railing? : Total 6 Click Score: 17    End of Session Equipment Utilized During Treatment: Gait belt Activity Tolerance: Patient tolerated treatment well Patient left: in chair;with call bell/phone within reach;with chair alarm set   PT  Visit Diagnosis: Unsteadiness on feet (R26.81);Difficulty in walking, not elsewhere classified (R26.2);Muscle weakness (generalized) (M62.81)     Time: 2706-2376 PT Time Calculation (min) (ACUTE ONLY): 17 min  Charges:  $Therapeutic Activity: 8-22 mins                    Lavone Nian, PT, DPT 12/16/20, 9:45 AM    Waunita Schooner 12/16/2020, 9:39 AM

## 2020-12-16 NOTE — Discharge Summary (Signed)
Physician Discharge Summary  Kristy Mcmillan NTI:[id number] DOB: [birth year] DOA: 12/12/2020  PCP: [facility] [place] date: 12/12/2020 Discharge date: 12/16/2020  Admitted From: Home Disposition: Home  Recommendations for Outpatient Follow-up:  Follow up with PCP in 1-2 weeks Follow-up with podiatry in 1 week Please obtain BMP/CBC in one week Please follow up on the following pending results: None  Home Health: Yes Equipment/Devices: Wheelchair, 3 in 1 Discharge Condition: Stable CODE STATUS: Full Diet recommendation: Heart Healthy / Carb Modified   Brief/Interim Summary: Kristy Mcmillan is a 45 y.o. 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 She was seen by her podiatrist 1 day prior to her admission for these concerns. MRI with some concern of osteomyelitis and bone necrosis along with an abscess formation which needs drainage.  Podiatry was consulted and patient was taken to the OR for incision, drainage and debridement of bone.  There was a lot of necrotic bone and tissue which was cleaned.  Per operative note does not look very grossly infected.  Patient did received IV daptomycin and discharged on doxycycline home.  She will be nonweightbearing on that limb for the next 2 to 3 weeks and will require some supportive device after that as there was a ruptured tendon which cannot be repaired. She will follow-up with her podiatrist for further recommendations and management. Intraoperative cultures so far are negative.  There was some concern of underlying cognitive impairment as she was refusing most of the medical  management, history of being noncompliant for different reasons.  She also initially refused to sign the consent stating that she wants her tooth to be taken care of before that leg.  We obtained psych evaluation and she found to have full capacity to make decisions.  Later she decided to proceed with surgery which underwent without any problem.  Our physical therapist recommended SNF as she will be needing 24/7 assistance but she decided to go home with home health services.  Apparently husband and one of her friend will provide assistance.  Patient has very uncontrolled diabetes with A1c of 10.9 and hyperglycemia.  She was refusing most of the medications which include insulin stating that insulin makes her sick??.  She was using Tradjenta multiple times a day at home despite being told that maximum dose is 5 mg daily.  She was also started on Amaryl.  She will need a close follow-up by primary care provider for further management and diabetes education.  We did provide diabetes education but it looks like patient has a very poor insight of her disease.  She will continue with rest of her home medications and follow-up with her providers.  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 You are being given antibiotics for 2 weeks, please take it as directed. We are also arranging home health services for wound care. You cannot put any weight on your left foot for the next 2 to 3  weeks. You will need some assisted device after that to walk with that feet which can be provided from your podiatry office as you have a ruptured tendon in that leg. Follow-up with your podiatrist in 1 week for further recommendations   Discharge wound care:   Complete by: As directed    Xeroform to the incision line followed by 4 x 4 gauze, ABD, Kerlix, web roll, posterior splint, Ace wrap.   Increase activity slowly   Complete by: As directed       Allergies as of 12/16/2020       Reactions   Dilantin [phenytoin Sodium Extended] Other (See Comments)   Causes seizures   Phenobarbital Other (See Comments)   Causes seizures Other reaction(s): Unknown Causes seizures   Phenytoin Sodium Extended    Other reaction(s): Other (See Comments) Causes seizures   Aspartame Diarrhea, Nausea And Vomiting   Aspartame And Phenylalanine Diarrhea, Nausea And Vomiting   Carbamazepine Other (See Comments)   Other reaction(s): Unknown   Cymbalta [duloxetine Hcl] Diarrhea   Gabapentin Other (See Comments)   Other reaction(s): Unknown Feels like she is going to have a seizure   Amoxicillin Rash   Ciprofloxacin Rash   blisters   Humalog [insulin Lispro] Rash   Lantus [insulin Glargine] Rash   Novolin 70-30 [insulin Nph Isophane & Regular] Itching, Rash   Penicillins Itching, Rash        Medication List     TAKE these medications    albuterol 108 (90 Base) MCG/ACT inhaler Commonly known as: VENTOLIN HFA Inhale 1 puff into the lungs every 6 (six) hours as needed for wheezing or shortness of breath.   aspirin 81 MG EC tablet Take 1 tablet (81 mg total) by mouth daily. Swallow whole.   atorvastatin 40 MG tablet Commonly known as: LIPITOR Take 40 mg by mouth at bedtime.   doxycycline 100 MG tablet Commonly known as: VIBRA-TABS Take 1 tablet (100 mg total) by mouth 2 (two) times daily for 14 days.   DULoxetine 30 MG capsule Commonly known as: CYMBALTA Take 30 mg by mouth 2 (two)  times daily.   ferrous sulfate 325 (65 FE) MG tablet Take 1 tablet (325 mg total) by mouth daily with breakfast.   fluticasone 50 MCG/ACT nasal spray Commonly known as: FLONASE Place 2 sprays into both nostrils daily.   glimepiride 2 MG tablet Commonly known as: AMARYL Take 1 tablet (2 mg total) by mouth daily with breakfast. Start taking on: December 17, 2020   insulin detemir 100 UNIT/ML injection Commonly known as: LEVEMIR Inject 10 Units into the skin daily. Before bed.   linagliptin 5 MG Tabs tablet Commonly known as: TRADJENTA Take 5 mg by mouth 3 (three) times daily. 2 tabs in morning, 2 tabs at lunch, and 2 tabs at dinner.   lisinopril 2.5 MG tablet Commonly known as: ZESTRIL Take 2.5 mg by mouth daily.   multivitamin with minerals tablet Take 1 tablet by mouth daily.   nortriptyline 10 MG capsule Commonly known as: PAMELOR Take 40 mg by mouth at bedtime.   omeprazole 20 MG capsule Commonly known as: PRILOSEC Take  20 mg by mouth daily.   zonisamide 100 MG capsule Commonly known as: ZONEGRAN Take 100 mg by mouth daily.               Discharge Care Instructions  (From admission, onward)           Start     Ordered   12/16/20 0000  Discharge wound care:       Comments: Xeroform to the incision line followed by 4 x 4 gauze, ABD, Kerlix, web roll, posterior splint, Ace wrap.   12/16/20 1258            Follow-up Information     Sharlotte Alamo, DPM. Schedule an appointment as soon as possible for a visit in 1 week(s).   Specialty: Podiatry Contact information: [place] [address] [address] [place], [facility]. Schedule an appointment as soon as possible for a visit in 1 week(s).   Specialty: General Practice Contact information: [place] [address] [phone]                Allergies  Allergen Reactions   Dilantin [Phenytoin Sodium Extended] Other (See Comments)     Causes seizures   Phenobarbital Other (See Comments)    Causes seizures Other reaction(s): Unknown Causes seizures   Phenytoin Sodium Extended     Other reaction(s): Other (See Comments) Causes seizures   Aspartame Diarrhea and Nausea And Vomiting   Aspartame And Phenylalanine Diarrhea and Nausea And Vomiting   Carbamazepine Other (See Comments)    Other reaction(s): Unknown   Cymbalta [Duloxetine Hcl] Diarrhea   Gabapentin Other (See Comments)    Other reaction(s): Unknown Feels like she is going to have a seizure   Amoxicillin Rash   Ciprofloxacin Rash    blisters   Humalog [Insulin Lispro] Rash   Lantus [Insulin Glargine] Rash   Novolin 70-30 [Insulin Nph Isophane & Regular] Itching and Rash   Penicillins Itching and Rash    Consultations: Podiatry  Procedures/Studies: DG Ankle Complete Left  Result Date: 12/12/2020 CLINICAL DATA:  Diabetic wound EXAM: LEFT ANKLE COMPLETE - 3+ VIEW COMPARISON:  None. FINDINGS: Vascular calcifications. Small plantar calcaneal spur. Marked medial soft tissue swelling without emphysema. No frank bony destructive change, there may be slight periosteal reaction at the distal tibia on the medial side. 10 mm osteochondral lesion at the medial talar dome. IMPRESSION: 1. Abundant soft tissue swelling on the medial side of the ankle and lower leg. 2. Nonspecific very subtle periosteal reaction at the tibia without frank bony destructive change. 3. 10 mm osteochondral lesion of the medial talar dome Electronically Signed   By: Donavan Foil M.D.   On: 12/12/2020 22:08   MR ANKLE LEFT WO CONTRAST  Result Date: 12/13/2020 CLINICAL DATA:  Diabetic patient with a skin wound, soft tissue swelling and pain about the left ankle. EXAM: MRI OF THE LEFT ANKLE WITHOUT CONTRAST TECHNIQUE: Multiplanar, multisequence MR imaging of the ankle was performed. No intravenous contrast was administered. COMPARISON:  Plain films left ankle 12/12/2020. FINDINGS: TENDONS  Peroneal: Intact. Posteromedial: The tibialis posterior tendon is completely torn. Proximal tendon stump is approximately 2.5 cm above the tip of the medial malleolus. Distal tendon stump is seen just proximal to the navicular. There is heterogeneously increased T2 signal in the expected location of the tendon along the neck of the talus. The flexor hallucis longus and flexor digitorum longus tendons are intact.  Anterior: Intact. Achilles: Intact. Plantar Fascia: Intact.  Negative for plantar fasciitis. LIGAMENTS Lateral: Intact. Medial: Intact. CARTILAGE Ankle Joint: Osteochondral lesion of the medial talar dome measures 1.1 cm transverse by 1.4 cm AP. Fluid undermines subchondral bone and the associated bone fragment is T1 and T2 hypointense consistent with osteonecrosis. No tibiotalar joint effusion. Subtalar Joints/Sinus Tarsi: Negative. Bones: Very mild marrow edema is seen in the medial malleolus. No fracture. Other: There is subcutaneous edema about the medial aspect of the ankle. Heterogeneous fluid collection extending along the medial and posteromedial tibia measures up to 3 cm AP by 1 cm transverse by 7.5 cm craniocaudal is consistent with an abscess. The top of the collection is 5.7 cm above the tibial plafond and it extends inferiorly along the expected tract of the tibialis posterior. IMPRESSION: Heterogeneous fluid collection along the posteromedial distal tibia as described is most consistent with an abscess. Complete tear of the tibialis posterior tendon. Markedly heterogeneous appearance of the expected location of the tendon along the talar neck is worrisome for phlegmon or abscess. The tear is likely due to septic tenosynovitis. Large osteochondral lesion of the medial talar dome with an unstable and necrotic bone fragment present. Very mild marrow and edema in the medial malleolus could be due to osteomyelitis but is more suggestive of reactive change. Electronically Signed   By: Inge Rise M.D.   On: 12/13/2020 09:35    Subjective: Patient was seen and examined today.  She was feeling better, pain is well controlled and she wants to go home.  She does not want to go to SNF and requesting home health services instead which were ordered.  Discharge Exam: Vitals:   12/16/20 0409 12/16/20 0732  BP: 103/62 116/67  Pulse: 84 94  Resp: 16 14  Temp: 98.3 F (36.8 C) 98.4 F (36.9 C)  SpO2: 98% 98%   Vitals:   12/15/20 1935 12/16/20 0011 12/16/20 0409 12/16/20 0732  BP: 124/81 120/60 103/62 116/67  Pulse: 94 87 84 94  Resp: 16 16 16 14   Temp: 98.1 F (36.7 C) 98.1 F (36.7 C) 98.3 F (36.8 C) 98.4 F (36.9 C)  TempSrc:      SpO2: 100% 100% 98% 98%  Weight:      Height:        General: Pt is alert, awake, not in acute distress Cardiovascular: RRR, S1/S2 +, no rubs, no gallops Respiratory: CTA bilaterally, no wheezing, no rhonchi Abdominal: Soft, NT, ND, bowel sounds + Extremities: no edema, no cyanosis,LLE with clean Ace wrap.   The results of significant diagnostics from this hospitalization (including imaging, microbiology, ancillary and laboratory) are listed below for reference.    Microbiology: Recent Results (from the past 240 hour(s))  Blood culture (routine x 2)     Status: None (Preliminary result)   Collection Time: 12/12/20  9:24 PM   Specimen: BLOOD  Result Value Ref Range Status   Specimen Description BLOOD RIGHT WRIST  Final   Special Requests   Final    BOTTLES DRAWN AEROBIC AND ANAEROBIC Blood Culture adequate volume   Culture   Final    NO GROWTH 4 DAYS Performed at [facility], [address] [place], [place] [address]    Report Status PENDING  Incomplete  Blood culture (routine x 2)     Status: None (Preliminary result)   Collection Time: 12/12/20  9:24 PM   Specimen: BLOOD  Result Value Ref Range Status   Specimen Description BLOOD LEFT  ASSIST CONTROL  Final   Special Requests   Final    BOTTLES DRAWN AEROBIC  AND ANAEROBIC Blood Culture adequate volume   Culture   Final    NO GROWTH 4 DAYS Performed at [facility], [place], [place] [address]    Report Status PENDING  Incomplete  SARS CORONAVIRUS 2 (TAT 6-24 HRS) Nasopharyngeal Nasopharyngeal Swab     Status: None   Collection Time: 12/12/20 10:28 PM   Specimen: Nasopharyngeal Swab  Result Value Ref Range Status   SARS Coronavirus 2 NEGATIVE NEGATIVE Final    Comment: (NOTE) SARS-CoV-2 target nucleic acids are NOT DETECTED.  The SARS-CoV-2 RNA is generally detectable in upper and lower respiratory specimens during the acute phase of infection. Negative results do not preclude SARS-CoV-2 infection, do not rule out co-infections with other pathogens, and should not be used as the sole basis for treatment or other patient management decisions. Negative results must be combined with clinical observations, patient history, and epidemiological information. The expected result is Negative.  Fact Sheet for Patients: SugarRoll.be  Fact Sheet for Healthcare Providers: https://www.woods-mathews.com/  This test is not yet approved or cleared by the [place] FDA and  has been authorized for detection and/or diagnosis of SARS-CoV-2 by FDA under an Emergency Use Authorization (EUA). This EUA will remain  in effect (meaning this test can be used) for the duration of the COVID-19 declaration under Se ction 564(b)(1) of the Act, 21 U.S.C. section 360bbb-3(b)(1), unless the authorization is terminated or revoked sooner.  Performed at [facility], [place] [address], [place], [place] [address]   Surgical pcr screen     Status: Abnormal   Collection Time: 12/13/20 10:56 AM   Specimen: Nasal Mucosa; Nasal Swab  Result Value Ref Range Status   MRSA, PCR POSITIVE (A) NEGATIVE Final    Comment: RESULT CALLED TO, READ BACK BY AND VERIFIED WITH: ANNA RODRIGUEZ AT 9211 ON 12/13/20  JH    Staphylococcus aureus POSITIVE (A) NEGATIVE Final    Comment: (NOTE) The Xpert SA Assay (FDA approved for NASAL specimens in patients 79 years of age and older), is one component of a comprehensive surveillance program. It is not intended to diagnose infection nor to guide or monitor treatment. Performed at [facility], [place], [place] [address]   Aerobic/Anaerobic Culture w Gram Stain (surgical/deep wound)     Status: None (Preliminary result)   Collection Time: 12/14/20  4:37 PM   Specimen: PATH Cytology Misc. fluid; Tissue  Result Value Ref Range Status   Specimen Description   Final    ANKLE LEFT ANKLE Performed at [facility], [address] [place], [place] [address]    Special Requests   Final    NONE Performed at [facility], [address] [place], [place] [address]    Gram Stain   Final    RARE WBC PRESENT, PREDOMINANTLY MONONUCLEAR NO ORGANISMS SEEN    Culture   Final    NO GROWTH 2 DAYS Performed at [facility], [place] [address], [place], [place] [address]    Report Status PENDING  Incomplete  Aerobic/Anaerobic Culture w Gram Stain (surgical/deep wound)     Status: None (Preliminary result)   Collection Time: 12/14/20  4:44 PM   Specimen: PATH Other; Tissue  Result Value Ref Range Status   Specimen Description   Final    ANKLE LEFT ANKLE ABSCESS Performed at [facility], [address], [place], [place] [address]    Special Requests   Final    NONE Performed at [facility], [address] [place], [place] [address]    Gram Stain   Final    MODERATE WBC PRESENT,BOTH PMN AND MONONUCLEAR NO ORGANISMS SEEN    Culture   Final    CULTURE REINCUBATED FOR BETTER GROWTH Performed at [facility], [place] [address] [place], [place] [address]    Report Status PENDING  Incomplete  Aerobic/Anaerobic Culture w Gram Stain (surgical/deep wound)     Status: None (Preliminary  result)   Collection Time: 12/14/20  4:59 PM   Specimen: PATH Bone biopsy; Tissue  Result Value Ref Range Status   Specimen Description   Final    BONE LEFT ANKLE BONE Performed at [facility], [address] [place], [place] [address]    Special Requests   Final    NONE Performed at [facility], [place], [place] [address]    Gram Stain NO WBC SEEN NO ORGANISMS SEEN   Final   Culture   Final    NO GROWTH 2 DAYS Performed at [facility], [place] [address] [place], [place] [address]    Report Status PENDING  Incomplete     Labs: BNP (last 3 results) No results for input(s): BNP in the last 8760 hours. Basic Metabolic Panel: Recent Labs  Lab 12/12/20 1849 12/13/20 0431 12/14/20 0352 12/16/20 0405  NA 140 139 137 137  K 4.6 3.9 4.3 4.1  CL 106 106 104 103  CO2 25 24 28 27   GLUCOSE 283* 140* 89 147*  BUN 26* 28* 27* 25*  CREATININE 1.46* 1.31* 1.38* 0.80  CALCIUM 9.8 9.1 8.7* 8.6*   Liver Function Tests: Recent Labs  Lab 12/12/20 1849 12/16/20 0405  AST 18 14*  ALT 12 8  ALKPHOS 59 51  BILITOT 0.7 0.5  PROT 8.0 6.6  ALBUMIN 3.9 3.3*   No results for input(s): LIPASE, AMYLASE in the last 168 hours. No results for input(s): AMMONIA in the last 168 hours. CBC: Recent Labs  Lab 12/12/20 1849 12/13/20 0431 12/14/20 0352 12/16/20 0405  WBC 12.3* 10.1 8.6 9.4  NEUTROABS 8.5*  --   --  3.2  HGB 13.2 12.1 11.8* 11.2*  HCT 39.8 36.3 36.2 32.7*  MCV 80.1 79.3* 81.9 79.4*  PLT 506* 431* 392 373   Cardiac Enzymes: Recent Labs  Lab 12/15/20 0229  CKTOTAL 35*   BNP: Invalid input(s): POCBNP CBG: Recent Labs  Lab 12/15/20 1159 12/15/20 1634 12/15/20 2109 12/16/20 0750 12/16/20 1255  GLUCAP 134* 102* 150* 161* 190*   D-Dimer No results for input(s): DDIMER in the last 72 hours. Hgb A1c No results for input(s): HGBA1C in the last 72 hours. Lipid Profile No results for input(s): CHOL, HDL, LDLCALC, TRIG,  CHOLHDL, LDLDIRECT in the last 72 hours. Thyroid function studies No results for input(s): TSH, T4TOTAL, T3FREE, THYROIDAB in the last 72 hours.  Invalid input(s): FREET3 Anemia work up No results for input(s): VITAMINB12, FOLATE, FERRITIN, TIBC, IRON, RETICCTPCT in the last 72 hours. Urinalysis    Component Value Date/Time   COLORURINE AMBER (A) 12/12/2020 2229   APPEARANCEUR CLOUDY (A) 12/12/2020 2229   APPEARANCEUR Hazy 10/23/2012 1807   LABSPEC 1.026 12/12/2020 2229   LABSPEC 1.011 10/23/2012 1807   PHURINE 5.0 12/12/2020 2229   GLUCOSEU 50 (A) 12/12/2020 2229   GLUCOSEU >=500 10/23/2012 1807   HGBUR  NEGATIVE 12/12/2020 2229   BILIRUBINUR NEGATIVE 12/12/2020 2229   BILIRUBINUR Negative 10/23/2012 1807   KETONESUR 5 (A) 12/12/2020 2229   PROTEINUR >=300 (A) 12/12/2020 2229   UROBILINOGEN 0.2 10/25/2012 0936   NITRITE NEGATIVE 12/12/2020 2229   LEUKOCYTESUR MODERATE (A) 12/12/2020 2229   LEUKOCYTESUR Negative 10/23/2012 1807   Sepsis Labs Invalid input(s): PROCALCITONIN,  WBC,  LACTICIDVEN Microbiology Recent Results (from the past 240 hour(s))  Blood culture (routine x 2)     Status: None (Preliminary result)   Collection Time: 12/12/20  9:24 PM   Specimen: BLOOD  Result Value Ref Range Status   Specimen Description BLOOD RIGHT WRIST  Final   Special Requests   Final    BOTTLES DRAWN AEROBIC AND ANAEROBIC Blood Culture adequate volume   Culture   Final    NO GROWTH 4 DAYS Performed at [facility], [address] [place], [place] [address]    Report Status PENDING  Incomplete  Blood culture (routine x 2)     Status: None (Preliminary result)   Collection Time: 12/12/20  9:24 PM   Specimen: BLOOD  Result Value Ref Range Status   Specimen Description BLOOD LEFT ASSIST CONTROL  Final   Special Requests   Final    BOTTLES DRAWN AEROBIC AND ANAEROBIC Blood Culture adequate volume   Culture   Final    NO GROWTH 4 DAYS Performed at [facility],  [address] [place], [place] [address]    Report Status PENDING  Incomplete  SARS CORONAVIRUS 2 (TAT 6-24 HRS) Nasopharyngeal Nasopharyngeal Swab     Status: None   Collection Time: 12/12/20 10:28 PM   Specimen: Nasopharyngeal Swab  Result Value Ref Range Status   SARS Coronavirus 2 NEGATIVE NEGATIVE Final    Comment: (NOTE) SARS-CoV-2 target nucleic acids are NOT DETECTED.  The SARS-CoV-2 RNA is generally detectable in upper and lower respiratory specimens during the acute phase of infection. Negative results do not preclude SARS-CoV-2 infection, do not rule out co-infections with other pathogens, and should not be used as the sole basis for treatment or other patient management decisions. Negative results must be combined with clinical observations, patient history, and epidemiological information. The expected result is Negative.  Fact Sheet for Patients: SugarRoll.be  Fact Sheet for Healthcare Providers: https://www.woods-mathews.com/  This test is not yet approved or cleared by the [place] FDA and  has been authorized for detection and/or diagnosis of SARS-CoV-2 by FDA under an Emergency Use Authorization (EUA). This EUA will remain  in effect (meaning this test can be used) for the duration of the COVID-19 declaration under Se ction 564(b)(1) of the Act, 21 U.S.C. section 360bbb-3(b)(1), unless the authorization is terminated or revoked sooner.  Performed at [facility], [place] [address], [place], [place] [address]   Surgical pcr screen     Status: Abnormal   Collection Time: 12/13/20 10:56 AM   Specimen: Nasal Mucosa; Nasal Swab  Result Value Ref Range Status   MRSA, PCR POSITIVE (A) NEGATIVE Final    Comment: RESULT CALLED TO, READ BACK BY AND VERIFIED WITH: ANNA RODRIGUEZ AT 5726 ON 12/13/20 JH    Staphylococcus aureus POSITIVE (A) NEGATIVE Final    Comment: (NOTE) The Xpert SA Assay (FDA approved for  NASAL specimens in patients 30 years of age and older), is one component of a comprehensive surveillance program. It is not intended to diagnose infection nor to guide or monitor treatment. Performed at [facility], [phone] [place], [place] [address]   Aerobic/Anaerobic Culture w Gram Stain (surgical/deep wound)     Status: None (Preliminary result)   Collection Time: 12/14/20  4:37 PM   Specimen: PATH Cytology Misc. fluid; Tissue  Result Value Ref Range Status   Specimen Description   Final    ANKLE LEFT ANKLE Performed at [facility], [address] [place], [place] [address]    Special Requests   Final    NONE Performed at [facility], [address] [place], [place] [address]    Gram Stain   Final    RARE WBC PRESENT, PREDOMINANTLY MONONUCLEAR NO ORGANISMS SEEN    Culture   Final    NO GROWTH 2 DAYS Performed at [facility], [place] [address] [place], [place] [address]    Report Status PENDING  Incomplete  Aerobic/Anaerobic Culture w Gram Stain (surgical/deep wound)     Status: None (Preliminary result)   Collection Time: 12/14/20  4:44 PM   Specimen: PATH Other; Tissue  Result Value Ref Range Status   Specimen Description   Final    ANKLE LEFT ANKLE ABSCESS Performed at [facility], [address] [place], [place] [address]    Special Requests   Final    NONE Performed at [facility], [address] [place], [place] [address]    Gram Stain   Final    MODERATE WBC PRESENT,BOTH PMN AND MONONUCLEAR NO ORGANISMS SEEN    Culture   Final    CULTURE REINCUBATED FOR BETTER GROWTH Performed at [facility], [place] [address] [place], [place] [address]    Report Status PENDING  Incomplete  Aerobic/Anaerobic Culture w Gram Stain (surgical/deep wound)     Status: None (Preliminary result)   Collection Time: 12/14/20  4:59 PM   Specimen: PATH Bone biopsy; Tissue  Result Value Ref Range Status    Specimen Description   Final    BONE LEFT ANKLE BONE Performed at [facility], [address] [place], [place] [address]    Special Requests   Final    NONE Performed at [facility], [place], [place] [address]    Gram Stain NO WBC SEEN NO ORGANISMS SEEN   Final   Culture   Final    NO GROWTH 2 DAYS Performed at [facility], [place] [address], [place],  [address]    Report Status PENDING  Incomplete    Time coordinating discharge: Over 30 minutes  SIGNED:  Lorella Nimrod, MD  [facility] 12/16/2020, 1:01 PM  If 7PM-7AM, please contact night-coverage [email or web address]  This record has been created using Systems analyst. Errors have been sought and corrected,but may not always be located. Such creation errors do not reflect on the standard of care.

## 2020-12-17 LAB — CULTURE, BLOOD (ROUTINE X 2)
Culture: NO GROWTH
Culture: NO GROWTH
Special Requests: ADEQUATE
Special Requests: ADEQUATE

## 2020-12-17 LAB — GLUCOSE, CAPILLARY: Glucose-Capillary: 87 mg/dL (ref 70–99)

## 2020-12-18 LAB — SURGICAL PATHOLOGY

## 2020-12-19 NOTE — Progress Notes (Signed)
Writer wasted one tab of Tramadol 50mg  with Jacques Navy RN. Wasted at [facility] on 12/19/20 at 1510.

## 2020-12-20 LAB — AEROBIC/ANAEROBIC CULTURE W GRAM STAIN (SURGICAL/DEEP WOUND)
Culture: NO GROWTH
Culture: NO GROWTH
Culture: NORMAL
Gram Stain: NONE SEEN

## 2020-12-24 ENCOUNTER — Encounter: Payer: Self-pay | Admitting: Oncology

## 2021-02-13 ENCOUNTER — Other Ambulatory Visit: Payer: Self-pay | Admitting: Family Medicine

## 2021-02-13 DIAGNOSIS — Z1231 Encounter for screening mammogram for malignant neoplasm of breast: Secondary | ICD-10-CM

## 2021-03-29 ENCOUNTER — Encounter: Payer: Self-pay | Admitting: Oncology

## 2021-04-05 ENCOUNTER — Ambulatory Visit: Payer: Medicare Other | Admitting: Podiatry

## 2021-04-16 ENCOUNTER — Ambulatory Visit: Payer: Medicare Other | Admitting: Podiatry

## 2021-04-16 ENCOUNTER — Ambulatory Visit (INDEPENDENT_AMBULATORY_CARE_PROVIDER_SITE_OTHER): Payer: Medicare Other | Admitting: Podiatry

## 2021-04-16 ENCOUNTER — Other Ambulatory Visit: Payer: Self-pay

## 2021-04-16 DIAGNOSIS — E0865 Diabetes mellitus due to underlying condition with hyperglycemia: Secondary | ICD-10-CM

## 2021-04-16 NOTE — Progress Notes (Signed)
HPI: 45 y.o. female presenting today with complaint of an infection that is going up the patient's left leg.  She states that she has seen multiple doctors within the county and she has been unsatisfied with all of them.  She most recently has been seen and treated by [facility] clinic.  She is adamant that she has an infection in her left leg although she has seen infectious disease.  She also states that she has a blockage in her right foot.  She presents for further treatment and evaluation  Past Medical History:  Diagnosis Date   Diabetes mellitus without complication ([place])    Seizures ([place]) 2005 Last sz   childhood febrile sz, then ?eclampsia     Physical Exam: General: The patient is alert and oriented x3 in no acute distress.  Dermatology: Skin is warm, dry and supple bilateral lower extremities. Negative for open lesions or macerations.  Hyperkeratotic dystrophic nails noted to the bilateral feet especially the left hallux nail plate  Vascular: No edema or erythema noted throughout the foot  Neurological: Epicritic and protective threshold diminished  Musculoskeletal Exam: History of right fifth ray amputation.  There is also a palpable lesion along the medial longitudinal arch of the right foot where a large incision site has been made which has healed.  The scar directly overlies the soft tissue lesion that she is feeling.  This is likely due from the surgery   Assessment: 1.  Diabetes mellitus; uncontrolled with hyperglycemia 2.  History of right foot fifth ray amputation   Plan of Care:  1. Patient evaluated.  2.  Unfortunately throughout today's visit I was unable to console the patient and explained to her that there is no clinical evidence of an infection that is running up her leg.  The patient is adamant that there is infection however she has seen multiple physicians and she states that she has multiple allergies to antibiotics.  She also states that she has been seen  by infectious disease.  Unfortunately there is nothing additional I can offer that has not already been offered to her. 3.  Patient is also adamant that she has a blockage in her right foot.  She can feel a lump in the bottom of her foot.  This is right along the incision that was performed likely due to a severe diabetic foot infection.  I tried to explain this to the patient however she continues to be in disbelief and again is very adamant that this is a blockage that needs to be addressed. 4.  The patient continued to be very frustrated and she states that she is seeing multiple doctors.  I simply did my best to explain to the patient that there is no clinical evidence of an infection in her left lower extremity and the palpable lesion to the plantar arch of the right foot is likely from the surgery that was performed and scar tissue adhesion within the area. 5.  Return to clinic as needed      Edrick Kins, DPM [facility]  Dr. Edrick Kins, DPM    [address], [place] [address]                Office [phone]  Fax [phone]

## 2021-05-27 ENCOUNTER — Ambulatory Visit: Payer: Medicare Other | Admitting: Internal Medicine

## 2021-05-27 NOTE — Progress Notes (Deleted)
New Outpatient Visit Date: 05/27/2021  Referring Provider: Bunnie Pion, [address] [place],  [place] [address]  Chief Complaint: ***  HPI:  Kristy Mcmillan is a 45 y.o. female who is being seen today for the evaluation of stroke at the request of Ms. Crawford. She has a history of stroke with resultant right hemiparesis, PAD, mild aphasia, and right foot drop, hypertension, type 2 diabetes mellitus, MRSA sepsis/osteomyelitis of the right foot, and seizure disorder.  She was hospitalized in 06/2020 with subcortical stroke in the setting of having stopped aspirin and Plavix 1 week earlier.  TEE during that admission was unrevealing without evidence of intracardiac thrombus or interatrial shunting.  --------------------------------------------------------------------------------------------------  Cardiovascular History & Procedures: Cardiovascular Problems: ***  Risk Factors: ***  Cath/PCI: ***  CV Surgery: ***  EP Procedures and Devices: ***  Non-Invasive Evaluation(s): TEE (07/09/2020): Normal LV size.  LVEF 60-65%.  Basal inferoseptal segment appeared aneurysmal.  Normal RV size and function.  No left atrial/left atrial appendage thrombus.  Normal right atrium.  Mild thickening of mitral valve with trivial regurgitation.  Trivial tricuspid regurgitation.  Tricuspid aortic valve without abnormality.  Minimal plaque of the ascending aorta.  No intracardiac shunt by color Doppler or bubble study.  Recent CV Pertinent Labs: Lab Results  Component Value Date   CHOL 190 07/04/2020   HDL 32 (L) 07/04/2020   LDLCALC 97 07/04/2020   TRIG 305 (H) 07/04/2020   CHOLHDL 5.9 07/04/2020   INR 1.0 07/03/2020   K 4.1 12/16/2020   K 4.2 10/23/2012   MG 1.7 03/16/2020   BUN 25 (H) 12/16/2020   BUN 8 10/23/2012   CREATININE 0.80 12/16/2020   CREATININE 0.59 10/25/2012    --------------------------------------------------------------------------------------------------  Past  Medical History:  Diagnosis Date   Diabetes mellitus without complication ([place])    Seizures ([place]) 2005 Last sz   childhood febrile sz, then ?eclampsia    Past Surgical History:  Procedure Laterality Date   AMPUTATION TOE Right 03/10/2020   Procedure: AMPUTATION FIFTH RAY WITH IRRIGATION AND DEBRIDEMENT RIGHT FOOT;  Surgeon: Sharlotte Alamo, DPM;  Location: [facility];  Service: Podiatry;  Laterality: Right;   BONE BIOPSY Left 12/14/2020   Procedure: BONE BIOPSY;  Surgeon: Caroline More, DPM;  Location: [facility];  Service: Podiatry;  Laterality: Left;   BUBBLE STUDY  07/09/2020   Procedure: BUBBLE STUDY;  Surgeon: Freada Bergeron, MD;  Location: [place];  Service: Cardiovascular;;   [place] N/A 03/02/2013   Procedure: CESAREAN SECTION;  Surgeon: Florian Buff, MD;  Location: [facility];  Service: Obstetrics;  Laterality: N/A;  Repeat Cesarean Section Delivery Nonviable Baby Girl @ 2158   IRRIGATION AND DEBRIDEMENT FOOT Right 03/15/2020   Procedure: IRRIGATION AND DEBRIDEMENT FOOT;  Surgeon: Sharlotte Alamo, DPM;  Location: [facility];  Service: Podiatry;  Laterality: Right;   IRRIGATION AND DEBRIDEMENT FOOT Left 12/14/2020   Procedure: IRRIGATION AND DEBRIDEMENT FOOT & ANKLE;  Surgeon: Caroline More, DPM;  Location: [facility];  Service: Podiatry;  Laterality: Left;   LOWER EXTREMITY ANGIOGRAPHY Right 03/13/2020   Procedure: Lower Extremity Angiography;  Surgeon: Katha Cabal, MD;  Location: [facility];  Service: Cardiovascular;  Laterality: Right;   TEE WITHOUT CARDIOVERSION N/A 07/09/2020   Procedure: TRANSESOPHAGEAL ECHOCARDIOGRAM (TEE);  Surgeon: Freada Bergeron, MD;  Location: [facility];  Service: Cardiovascular;  Laterality: N/A;    No outpatient medications have been marked as taking for the 05/27/21  encounter (Appointment) with Arvetta Araque, Harrell Gave, MD.    Allergies: Dilantin [phenytoin sodium extended], Phenobarbital, Phenytoin sodium extended,  Aspartame, Aspartame and phenylalanine, Carbamazepine, Cymbalta [duloxetine hcl], Gabapentin, Amoxicillin, Ciprofloxacin, Humalog [insulin lispro], Lantus [insulin glargine], Novolin 70-30 [insulin nph isophane & regular], and Penicillins  Social History   Tobacco Use   Smoking status: Some Days    Types: Cigarettes    Passive exposure: Current   Smokeless tobacco: Never  Substance Use Topics   Alcohol use: No   Drug use: No    Family History  Problem Relation Age of Onset   Diabetes Mother    Diabetes Father     Review of Systems: A 12-system review of systems was performed and was negative except as noted in the HPI.  --------------------------------------------------------------------------------------------------  Physical Exam: There were no vitals taken for this visit.  General:  *** HEENT: No conjunctival pallor or scleral icterus. Facemask in place. Neck: Supple without lymphadenopathy, thyromegaly, JVD, or HJR. No carotid bruit. Lungs: Normal work of breathing. Clear to auscultation bilaterally without wheezes or crackles. Heart: Regular rate and rhythm without murmurs, rubs, or gallops. Non-displaced PMI. Abd: Bowel sounds present. Soft, NT/ND without hepatosplenomegaly Ext: No lower extremity edema. Radial, PT, and DP pulses are 2+ bilaterally Skin: Warm and dry without rash. Neuro: CNIII-XII intact. Strength and fine-touch sensation intact in upper and lower extremities bilaterally. Psych: Normal mood and affect.  EKG:  ***  Lab Results  Component Value Date   WBC 9.4 12/16/2020   HGB 11.2 (L) 12/16/2020   HCT 32.7 (L) 12/16/2020   MCV 79.4 (L) 12/16/2020   PLT 373 12/16/2020    Lab Results  Component Value Date   NA 137 12/16/2020   K 4.1 12/16/2020   CL 103 12/16/2020   CO2 27 12/16/2020   BUN 25 (H) 12/16/2020   CREATININE 0.80 12/16/2020   GLUCOSE 147 (H) 12/16/2020   ALT 8 12/16/2020    Lab Results  Component Value Date   CHOL 190  07/04/2020   HDL 32 (L) 07/04/2020   LDLCALC 97 07/04/2020   TRIG 305 (H) 07/04/2020   CHOLHDL 5.9 07/04/2020     --------------------------------------------------------------------------------------------------  ASSESSMENT AND PLAN: Harrell Gave Janicia Monterrosa, MD 05/27/2021 7:12 AM

## 2021-05-28 ENCOUNTER — Encounter: Payer: Self-pay | Admitting: Internal Medicine

## 2022-04-28 ENCOUNTER — Encounter (INDEPENDENT_AMBULATORY_CARE_PROVIDER_SITE_OTHER): Payer: Self-pay

## 2022-06-22 IMAGING — MR MR FOOT*R* W/O CM
7 series · 40 of 40 positions shown · non-contrast
Comparison: Radiographs 03/08/2020

CLINICAL DATA: Right foot pain and swelling.  Diabetic.

EXAM:
MRI OF THE RIGHT FOREFOOT WITHOUT CONTRAST
TECHNIQUE: Multiplanar, multisequence MR imaging of the right foot was
performed. No intravenous contrast was administered.

[Series 7: T1 · coronal · right · 3.0mm · 0.38mm/px · 7 of 45 slices shown (1 of 2)]
[im 1/45]
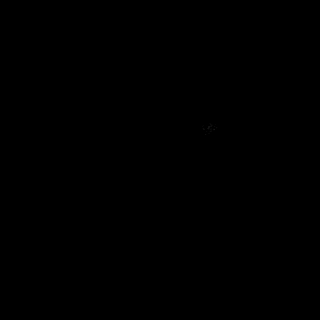
[im 8/45]
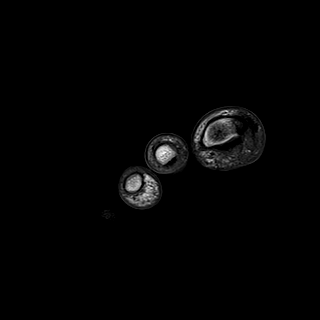
[im 15/45]
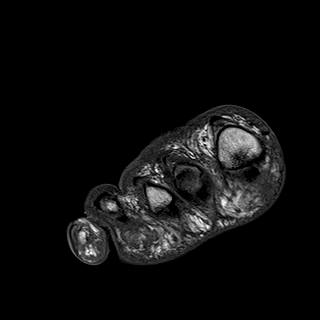
[im 23/45]
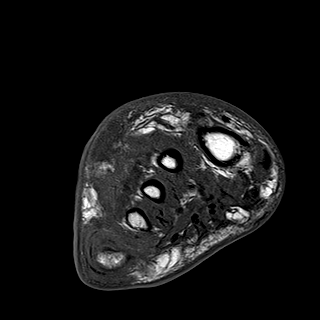
[im 30/45]
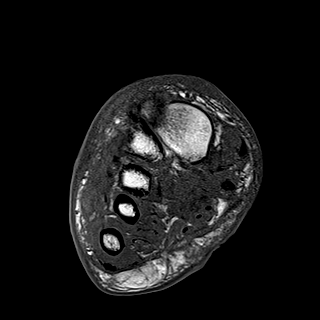
[im 37/45]
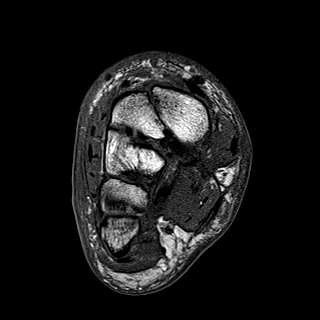
[im 45/45]
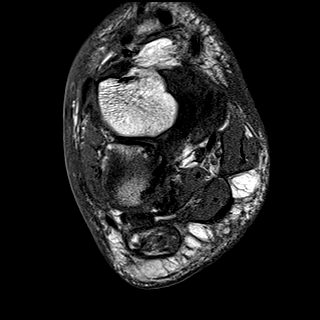

[Series 8: T2 · coronal · right · 3.0mm · 0.38mm/px · 8 of 45 slices shown (1 of 4)]
[im 1/45]
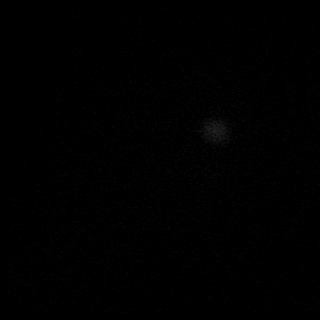
[im 7/45]
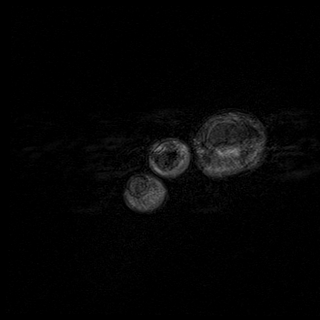
[im 13/45]
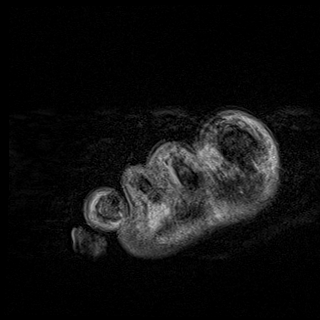
[im 19/45]
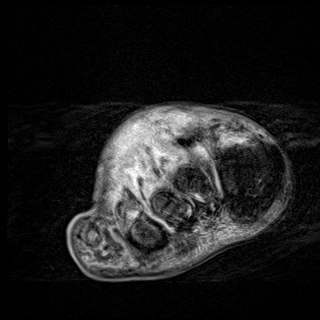
[im 26/45]
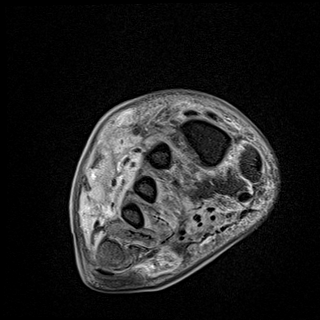
[im 32/45]
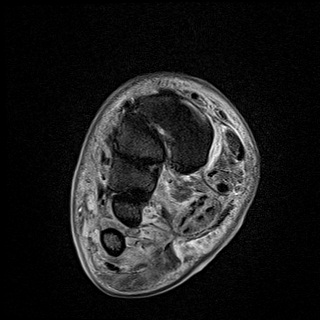
[im 38/45]
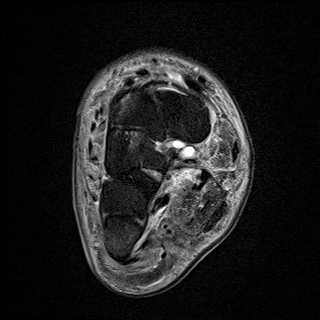
[im 45/45]
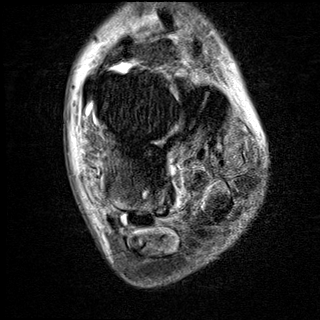

[Series 9: T2 · coronal · right · 3.0mm · 0.38mm/px · 8 of 45 slices shown (2 of 4)]
[im 1/45]
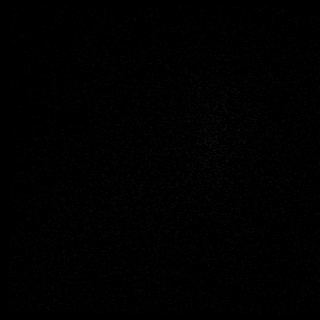
[im 7/45]
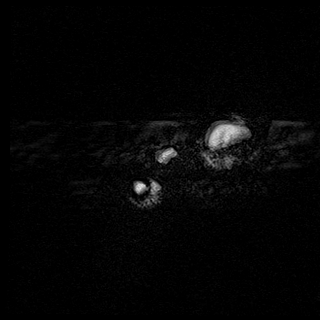
[im 13/45]
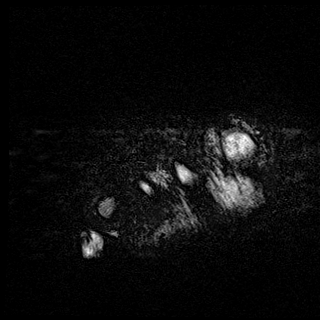
[im 19/45]
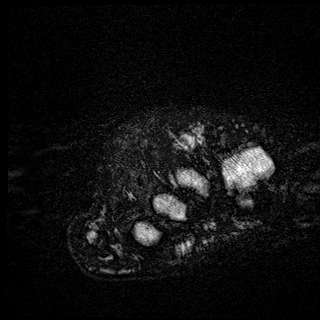
[im 26/45]
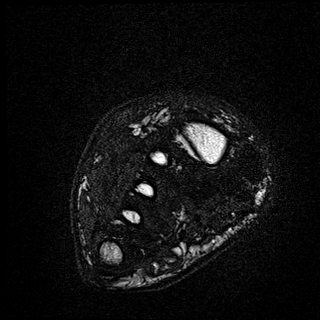
[im 32/45]
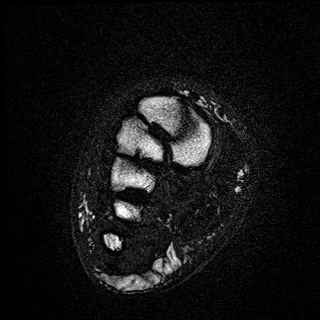
[im 38/45]
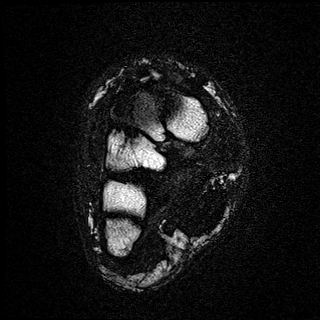
[im 45/45]
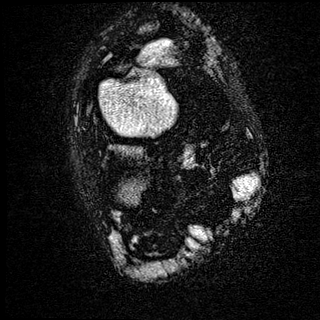

[Series 10: T1 · axial · right · 3.0mm · 0.70mm/px · z∈[-97,-17]mm · 4 of 23 slices shown (2 of 2)]
[im 1/23]
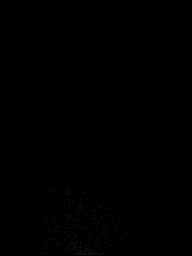
[im 8/23]
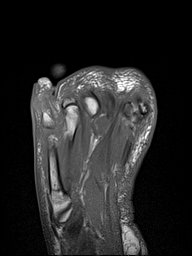
[im 15/23]
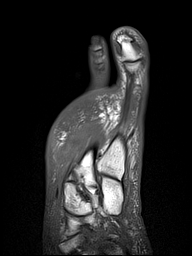
[im 23/23]
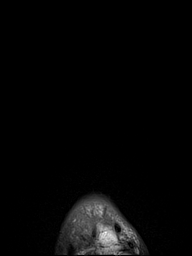

[Series 11: T2 · axial · right · 3.0mm · 0.70mm/px · z∈[-88,-4]mm · 4 of 23 slices shown (3 of 4)]
[im 1/23]
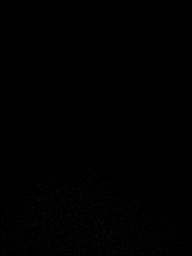
[im 8/23]
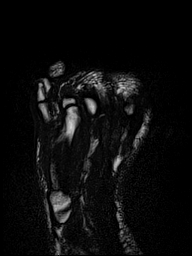
[im 15/23]
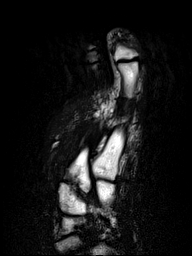
[im 23/23]
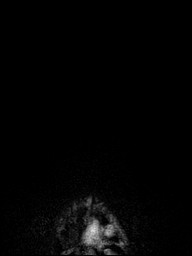

[Series 12: T2 · axial · right · 3.0mm · 0.70mm/px · z∈[-88,-4]mm · 4 of 23 slices shown (4 of 4)]
[im 1/23]
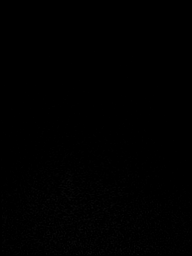
[im 8/23]
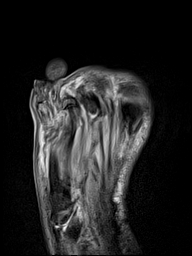
[im 15/23]
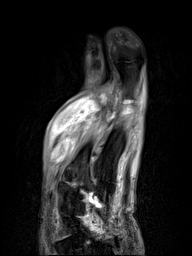
[im 23/23]
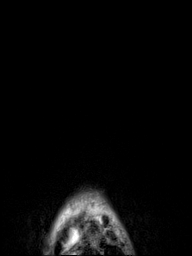

[Series 13: STIR · sagittal · right · 3.0mm · 0.62mm/px · 5 of 27 slices shown]
[im 1/27]
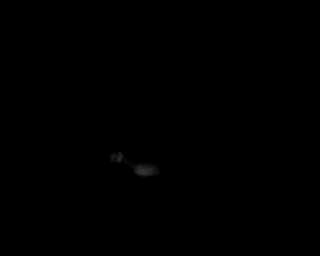
[im 7/27]
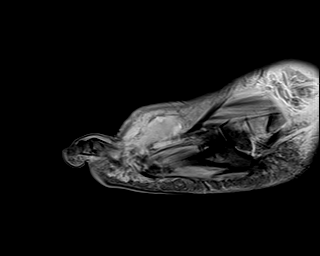
[im 14/27]
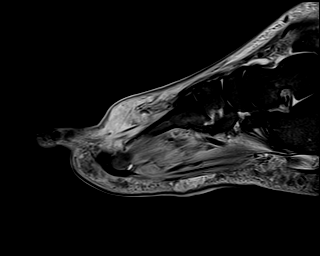
[im 20/27]
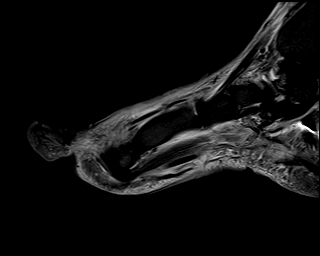
[im 27/27]
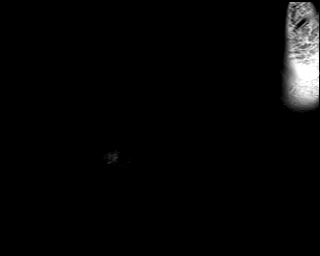

[40 of 40 positions shown; findings below may reference images not displayed]

FINDINGS: Exam limited by a patient motion and lack of IV contrast.

Severe diffuse subcutaneous soft tissue swelling/edema/fluid mainly
involving the dorsum of the foot. I do not see any increased T1
signal intensity to suggest hemorrhage/hematoma. More focal
appearance of the fluid signal intensity on the dorsum of the foot
is suspicious for an abscess but difficult to be certain without
contrast. It measures approximately 5 x 4 x 2 cm. There is also a
more focal fluid collection along the plantar aspect of the fifth
toe and also along the plantar aspect of the foot overlying the
fourth metatarsal region. These could be abscesses also.

There is T1 and T2 signal abnormality noted in the fifth metatarsal
head and fifth proximal phalanx worrisome for osteomyelitis. Could
not exclude septic arthritis at the fifth MTP joint. The surrounding
fluid collections are suspicious for abscesses.

Diffuse myofasciitis without obvious pyomyositis.

The midfoot bony structures are intact. No findings suspicious for
osteomyelitis.
IMPRESSION: 1. Exam significantly limited by patient motion and lack of
contrast.
2. Findings worrisome for septic arthritis at the fifth MTP joint
with osteomyelitis involving the fifth metatarsal head and fifth
proximal phalanx.
3. Suspect large dorsal and smaller plantar forefoot abscesses as
detailed above.
4. Diffuse myofasciitis without obvious pyomyositis.

## 2023-06-22 NOTE — Progress Notes (Signed)
Error

## 2023-07-08 LAB — GLUCOSE, POCT (MANUAL RESULT ENTRY): POC Glucose: 196 mg/dL — AB (ref 70–99)
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
## 2023-07-16 LAB — GLUCOSE, POCT (MANUAL RESULT ENTRY): POC Glucose: 195 mg/dL — AB (ref 70–99)

## 2023-07-16 NOTE — Congregational Nurse Program (Signed)
  Dept: [phone]   Congregational Nurse Program Note  Date of Encounter: 07/16/2023 Client into [employer] for household supplies and requesting nurse to check glucose as she doesn't have any testing strips and feels like her "sugar is high" today. Non fasting glucose is 195. Client states that she followed up with referral to start process to get medicaid transferred from [facility] to [facility]. Reviewed status with financial navigator for [facility] and assured client that she can follow up with [facility] (her listed Medicare provider) and be billed for the co-pay and then when [place] Medicaid activates take the bill to Medicaid to cover the copay. Client agrees to call clinic to set up appointment. To follow up with nurse in 1 wk at nurse only clinic. Rhermann, RN Past Medical History: Past Medical History:  Diagnosis Date   Diabetes mellitus without complication (HCC)    Seizures ([facility]) 2005 Last sz   childhood febrile sz, then ?eclampsia    Encounter Details:  Community Questionnaire - 07/16/23 1400       Questionnaire   Ask client: Do you give verbal consent for me to treat you today? Yes    Student Assistance N/A    Location Patient Information systems manager, [employer] CN site    [employer] Status Unknown    Insurance Medicaid;Medicare    Insurance/Financial Assistance Referral N/A    Medication N/A    Medical Provider Yes    Screening Referrals Made N/A    Medical Referrals Made [facility] PCP/Clinic    Medical Appointment Completed N/A    CNP Interventions Navigate Healthcare System;Advocate/Support;Educate    Screenings CN Performed Blood Glucose    ED Visit Averted N/A    Life-Saving Intervention Made N/A

## 2023-07-29 LAB — GLUCOSE, POCT (MANUAL RESULT ENTRY): POC Glucose: 201 mg/dL — AB (ref 70–99)

## 2023-07-29 NOTE — Congregational Nurse Program (Signed)
Dept: [phone]   Congregational Nurse Program Note   Date of Encounter: 07/29/2023  Client into nurse only clinic at pantry requesting help re: sore feet, glucose screening, BP check. Desires assistance accessing [facility] appointment in [place] where she had follow up for her vision in the past as well as furniture and kitchen supplies for apartment. Has appt with [facility] on 08/03/23 to address resource needs. Client Followed up with DSS for transfer of [place] and food stamps from [facility] and had first visit with PCP [facility]) yesterday. Received antibiotics for swollen, red great toe on left but hasn't taken first dose yet. Hasn't taken any meds yet today (reminded of importance to treat painful, swollen toe. Encouraged soaking toe but states doesn't have a pan to use. Encouraged trying to locate one. States tylenol upsets her stomach so she uses ibuprofen at times. Non medication pain management tips discussed. Requests suggestions for more food as she is running out and doesn't qualify to pick up food at pantry today. Referred to [facility]. Accessed [employer] so has new shoes. States she feels safe in home. Transportation is currently limited. Will use [employer] transportation and sister to access appointments.  BP 142/102 times 2 (hasn't taken meds yet today at 1245 pm), non fasting glucose 201 (ate a sausage biscuit and [employer] sweet tea for breakfast (no meds as prescribed today. Also needs strips for glucose monitor). Education re; taking meds as prescribed, diabetic nutrition, and hypertension and need to follow up with MD. Await activation of [place] Medicaid (application pending) to access appointment with eye doctor. . To follow up with MD requesting strips for glucose monitor. RTC in 1 wk for nurse visit.  Rhermann, RN Past Medical History: Past Medical History:  Diagnosis Date   Diabetes mellitus without complication (HCC)    Seizures (HCC) 2005 Last sz    childhood febrile sz, then ?eclampsia    Encounter Details:  Community Questionnaire - 07/29/23 1245       Questionnaire   Ask client: Do you give verbal consent for me to treat you today? Yes    Student Assistance N/A    Location Patient Information systems manager, Firefighter CN site    [employer] Status Unknown    Insurance Medicaid;Medicare    Insurance/Financial Assistance Referral N/A    Medication N/A    Medical Provider Yes    Screening Referrals Made N/A    Medical Referrals Made [facility]    Medical Appointment Completed Non-[facility] PCP/Clinic    CNP Interventions Navigate Healthcare System;Advocate/Support;Educate    Screenings CN Performed Blood Glucose;Blood Pressure    ED Visit Averted N/A    Life-Saving Intervention Made N/A               Dept: [phone]   Congregational Nurse Program Note  Date of Encounter: 07/29/2023  Past Medical History: Past Medical History:  Diagnosis Date   Diabetes mellitus without complication (HCC)    Seizures (HCC) 2005 Last sz   childhood febrile sz, then ?eclampsia    Encounter Details:  Community Questionnaire - 07/29/23 1245       Questionnaire   Ask client: Do you give verbal consent for me to treat you today? Yes    Student Assistance N/A    Location Patient Information systems manager, Firefighter CN site    [employer] Status Unknown    Insurance Medicaid;Medicare    Insurance/Financial Assistance Referral  N/A    Medication N/A    Medical Provider Yes    Screening Referrals Made N/A    Medical Referrals Made [facility]    Medical Appointment Completed [facility] PCP/Clinic    CNP Interventions Navigate Healthcare System;Advocate/Support;Educate    Screenings CN Performed Blood Glucose;Blood Pressure    ED Visit Averted N/A    Life-Saving Intervention Made N/A

## 2023-09-01 ENCOUNTER — Emergency Department

## 2023-09-01 ENCOUNTER — Emergency Department
Admission: EM | Admit: 2023-09-01 | Discharge: 2023-09-01 | Disposition: A | Discharge status: Home / Self Care | Attending: Emergency Medicine | Admitting: Emergency Medicine

## 2023-09-01 ENCOUNTER — Other Ambulatory Visit: Payer: Self-pay

## 2023-09-01 ENCOUNTER — Encounter: Payer: Self-pay | Admitting: Oncology

## 2023-09-01 DIAGNOSIS — E1022 Type 1 diabetes mellitus with diabetic chronic kidney disease: Secondary | ICD-10-CM | POA: Diagnosis not present

## 2023-09-01 DIAGNOSIS — E1040 Type 1 diabetes mellitus with diabetic neuropathy, unspecified: Secondary | ICD-10-CM | POA: Diagnosis not present

## 2023-09-01 DIAGNOSIS — W19XXXA Unspecified fall, initial encounter: Secondary | ICD-10-CM

## 2023-09-01 DIAGNOSIS — Y9301 Activity, walking, marching and hiking: Secondary | ICD-10-CM | POA: Diagnosis not present

## 2023-09-01 DIAGNOSIS — N183 Chronic kidney disease, stage 3 unspecified: Secondary | ICD-10-CM | POA: Insufficient documentation

## 2023-09-01 DIAGNOSIS — W010XXA Fall on same level from slipping, tripping and stumbling without subsequent striking against object, initial encounter: Secondary | ICD-10-CM | POA: Diagnosis not present

## 2023-09-01 DIAGNOSIS — I129 Hypertensive chronic kidney disease with stage 1 through stage 4 chronic kidney disease, or unspecified chronic kidney disease: Secondary | ICD-10-CM | POA: Diagnosis not present

## 2023-09-01 DIAGNOSIS — S0990XA Unspecified injury of head, initial encounter: Secondary | ICD-10-CM | POA: Diagnosis present

## 2023-09-01 NOTE — ED Provider Notes (Signed)
 [facility] Provider Note    Event Date/Time   First MD Initiated Contact with Patient 09/01/23 [phone]     (approximate)   History   Fall   HPI Kristy Mcmillan is a 48 y.o. female with history of DM1, obesity, CKD stage III, HTN, HLD, diabetic neuropathy presenting today for fall.  Patient states last night she fell when walking in her apartment because the ground is uneven and she tripped.  She states hitting the left side of her head and has had a headache since then.  1 episode of nausea and vomiting.  Otherwise, no loss of consciousness, no blood thinners, no neck pain.  Denies injury elsewhere.  No confusion.  No change in her vision.     Physical Exam   Triage Vital Signs: ED Triage Vitals  Encounter Vitals Group     BP      Systolic BP Percentile      Diastolic BP Percentile      Pulse      Resp      Temp      Temp src      SpO2      Weight      Height      Head Circumference      Peak Flow      Pain Score      Pain Loc      Pain Education      Exclude from Growth Chart     Most recent vital signs: Vitals:   09/01/23 0834  BP: 126/86  Pulse: 98  Resp: 16  Temp: 98.4 F (36.9 C)  SpO2: 95%    I have reviewed the vital signs. General:  Awake, alert, no acute distress. Head:  Normocephalic, Atraumatic. EENT:  PERRL, EOMI, Oral mucosa pink and moist, Neck is supple. Cardiovascular: Regular rate, 2+ distal pulses. Respiratory:  Normal respiratory effort, symmetrical expansion, no distress.   Extremities:  Moving all four extremities through full ROM without pain.  No C, T, or L-spine tenderness palpation.  No tenderness palpation throughout bilateral upper or lower extremities. Neuro:  Alert and oriented.  Interacting appropriately.   Skin:  Warm, dry, no rash.   Psych: Appropriate affect.    ED Results / Procedures / Treatments   Labs (all labs ordered are listed, but only abnormal results are displayed) Labs Reviewed - No  data to display   EKG    RADIOLOGY Independently interpreted CT head with no acute pathology   PROCEDURES:  Critical Care performed: No  Procedures   MEDICATIONS ORDERED IN ED: Medications - No data to display   IMPRESSION / MDM / ASSESSMENT AND PLAN / ED COURSE  I reviewed the triage vital signs and the nursing notes.                              Differential diagnosis includes, but is not limited to, ICH, soft tissue injury, scalp abrasion  Patient's presentation is most consistent with acute complicated illness / injury requiring diagnostic workup.  Patient is a 48 year old female presenting today for fall with head injury to the left side.  Headache with 1 episode of associated nausea and vomiting.  Patient does have some vision issues at baseline but hard to discern if there is anything new.  Will get CT head to rule out ICH pathology.  No indication for CT of the C-spine given negative [place]  C-spine scoring.  Vital signs and physical exam otherwise stable.  CT head shows no acute intracranial pathology.  Patient safe for discharge and follow-up with her primary care provider outpatient.     FINAL CLINICAL IMPRESSION(S) / ED DIAGNOSES   Final diagnoses:  Fall, initial encounter  Injury of head, initial encounter     Rx / DC Orders   ED Discharge Orders     None        Note:  This document was prepared using Dragon voice recognition software and may include unintentional dictation errors.   Janith Lima, MD 09/01/23 [phone]

## 2023-09-01 NOTE — Discharge Instructions (Signed)
 Please follow-up with your primary care team outpatient for ongoing management of your chronic medical problems.

## 2023-09-01 NOTE — ED Triage Notes (Signed)
 Pt arrives via [facility] with c/o a fall overnight. Pt walked to the magistrates office to call because they do not have access to a phone. Pt states that they hit their head but denies LOC. Pt states that they hit their head and that it hurts on the left side. Pt is A&Ox4 and in NAD at this time.

## 2023-10-09 ENCOUNTER — Other Ambulatory Visit: Payer: Self-pay

## 2023-10-09 ENCOUNTER — Emergency Department
Admission: EM | Admit: 2023-10-09 | Discharge: 2023-10-09 | Disposition: A | Discharge status: Home / Self Care | Attending: Emergency Medicine | Admitting: Emergency Medicine

## 2023-10-09 ENCOUNTER — Emergency Department

## 2023-10-09 DIAGNOSIS — E1122 Type 2 diabetes mellitus with diabetic chronic kidney disease: Secondary | ICD-10-CM | POA: Diagnosis not present

## 2023-10-09 DIAGNOSIS — Z8673 Personal history of transient ischemic attack (TIA), and cerebral infarction without residual deficits: Secondary | ICD-10-CM | POA: Diagnosis not present

## 2023-10-09 DIAGNOSIS — N189 Chronic kidney disease, unspecified: Secondary | ICD-10-CM | POA: Insufficient documentation

## 2023-10-09 DIAGNOSIS — K5900 Constipation, unspecified: Secondary | ICD-10-CM | POA: Diagnosis present

## 2023-10-09 DIAGNOSIS — K5641 Fecal impaction: Secondary | ICD-10-CM | POA: Insufficient documentation

## 2023-10-09 LAB — COMPREHENSIVE METABOLIC PANEL WITH GFR
ALT: 13 U/L (ref 0–44)
AST: 16 U/L (ref 15–41)
Albumin: 3.7 g/dL (ref 3.5–5.0)
Alkaline Phosphatase: 70 U/L (ref 38–126)
Anion gap: 9 (ref 5–15)
BUN: 22 mg/dL — ABNORMAL HIGH (ref 6–20)
CO2: 27 mmol/L (ref 22–32)
Calcium: 9.5 mg/dL (ref 8.9–10.3)
Chloride: 101 mmol/L (ref 98–111)
Creatinine, Ser: 0.89 mg/dL (ref 0.44–1.00)
GFR, Estimated: >60 mL/min (ref >60)
Glucose, Bld: 249 mg/dL — ABNORMAL HIGH (ref 70–99)
Potassium: 3.8 mmol/L (ref 3.5–5.1)
Sodium: 137 mmol/L (ref 135–145)
Total Bilirubin: 0.8 mg/dL (ref 0.0–1.2)
Total Protein: 7.5 g/dL (ref 6.5–8.1)

## 2023-10-09 LAB — CBC
HCT: 39.1 % (ref 36.0–46.0)
Hemoglobin: 13.5 g/dL (ref 12.0–15.0)
MCH: 29 pg (ref 26.0–34.0)
MCHC: 34.5 g/dL (ref 30.0–36.0)
MCV: 84.1 fL (ref 80.0–100.0)
Platelets: 408 10*3/uL — ABNORMAL HIGH (ref 150–400)
RBC: 4.65 MIL/uL (ref 3.87–5.11)
RDW: 11.9 % (ref 11.5–15.5)
WBC: 10.2 10*3/uL (ref 4.0–10.5)
nRBC: 0 % (ref 0.0–0.2)

## 2023-10-09 LAB — LIPASE, BLOOD: Lipase: 31 U/L (ref 11–51)

## 2023-10-09 MED ORDER — GLYCERIN (LAXATIVE) 2 G RE SUPP: 1.0000 | Freq: Once | RECTAL | Status: DC

## 2023-10-09 MED ORDER — POLYETHYLENE GLYCOL 3350 17 GM/SCOOP PO POWD: 17.0000 g | Freq: Every day | ORAL | 0 refills | Status: AC

## 2023-10-09 MED ORDER — METOCLOPRAMIDE HCL 10 MG PO TABS
10.0000 mg | ORAL_TABLET | Freq: Once | ORAL | Status: AC
  Administered 2023-10-09: 10 mg via ORAL
  Filled 2023-10-09: qty 1

## 2023-10-09 MED ORDER — POLYETHYLENE GLYCOL 3350 17 G PO PACK: 17.0000 g | PACK | Freq: Every day | ORAL | Status: DC

## 2023-10-09 MED ORDER — POLYETHYLENE GLYCOL 3350 17 G PO PACK
17.0000 g | PACK | Freq: Every day | ORAL | Status: DC
  Filled 2023-10-09: qty 1

## 2023-10-09 MED ORDER — AMOXICILLIN-POT CLAVULANATE 875-125 MG PO TABS: 1.0000 | ORAL_TABLET | Freq: Two times a day (BID) | ORAL | 0 refills | Status: AC

## 2023-10-09 NOTE — ED Notes (Signed)
 Report given to [facility]

## 2023-10-09 NOTE — Discharge Instructions (Signed)
 We offered to perform a digital rectal disimpaction and insert a glycerin suppository to help with your symptoms of constipation, but you declined at this time.  You should increase your fiber intake as well as increase your intake of water and juice.  Your x-ray shows constipation with what appears to be a stool ball in the rectum.  You can use over-the-counter glycerin suppositories to help evacuate the stool ball.  Take MiraLAX twice daily until you have a meaningful bowel movement.  Follow-up with your primary provider or return to the ED if needed.

## 2023-10-09 NOTE — ED Provider Notes (Signed)
 [facility] Emergency Department Provider Note     Event Date/Time   First MD Initiated Contact with Patient 10/09/23 1533     (approximate)   History   Abdominal Pain   HPI  Kristy Mcmillan is a 48 y.o. female with a history of CKD, seizure disorder, diabetes, CVA, and GERD, presents to the ED via EMS from home.  She presents with complaints of dental infection as well as constipation for the last few days.  She is not endorsing any abdominal pain to me on interview.  She is reporting that she feels like she has infection coming from her teeth, and that is the source of her nausea and vomiting.  She continues to have dry heaving which seems to be self-inflicted in the room.  By EMS report, patient was actively vomiting en route.  She received 4 mg of Zofran per IV.  She presents in no acute distress.   Physical Exam   Triage Vital Signs: ED Triage Vitals  Encounter Vitals Group     BP 10/09/23 1436 134/87     Systolic BP Percentile --      Diastolic BP Percentile --      Pulse Rate 10/09/23 1436 (!) 110     Resp 10/09/23 1436 17     Temp 10/09/23 1436 97.9 F (36.6 C)     Temp src --      SpO2 10/09/23 1436 98 %     Weight 10/09/23 1437 145 lb 15.1 oz (66.2 kg)     Height 10/09/23 1437 5\' 3"  (1.6 m)     Head Circumference --      Peak Flow --      Pain Score 10/09/23 1436 10     Pain Loc --      Pain Education --      Exclude from Growth Chart --     Most recent vital signs: Vitals:   10/09/23 1853 10/09/23 1856  BP: 138/83   Pulse: 96   Resp: 18   Temp:  98.3 F (36.8 C)  SpO2: 100%     General Awake, no distress. NAD HEENT NCAT. PERRL. EOMI. No rhinorrhea. Mucous membranes are moist.  CV:  Good peripheral perfusion. RRR RESP:  Normal effort. CTA ABD:  No distention.  Soft and nontender.  No rebound, guarding, or rigidity appreciated.  Normoactive bowel sounds noted.  No CVA tenderness elicited.  ED Results / Procedures /  Treatments   Labs (all labs ordered are listed, but only abnormal results are displayed) Labs Reviewed  COMPREHENSIVE METABOLIC PANEL WITH GFR - Abnormal; Notable for the following components:      Result Value   Glucose, Bld 249 (*)    BUN 22 (*)    All other components within normal limits  CBC - Abnormal; Notable for the following components:   Platelets 408 (*)    All other components within normal limits  LIPASE, BLOOD     EKG   RADIOLOGY  I personally viewed and evaluated these images as part of my medical decision making, as well as reviewing the written report by the radiologist.  ED Provider Interpretation: Moderate stool burden and evidence of a rectal fecal impaction  DG Abdomen 1 View Result Date: 10/09/2023 CLINICAL DATA:  Constipation EXAM: ABDOMEN - 1 VIEW COMPARISON:  None Available. FINDINGS: The bowel gas pattern is normal. There is a large amount of stool in the rectum. The rectum is dilated measuring  up to 7.8 cm in transverse dimension. Overall there is moderate to large stool burden. No radio-opaque calculi or other significant radiographic abnormality are seen. IMPRESSION: Findings concerning for rectal fecal impaction. Overall moderate large stool burden throughout the remaining colon. Electronically Signed   By: Tyron Gallon M.D.   On: 10/09/2023 18:47     PROCEDURES:  Critical Care performed: No  Procedures   MEDICATIONS ORDERED IN ED: Medications  polyethylene glycol (MIRALAX / GLYCOLAX) packet 17 g (has no administration in time range)  metoCLOPramide (REGLAN) tablet 10 mg (10 mg Oral Given 10/09/23 1722)     IMPRESSION / MDM / ASSESSMENT AND PLAN / ED COURSE  I reviewed the triage vital signs and the nursing notes.                              Differential diagnosis includes, but is not limited to, ovarian cyst, ovarian torsion, acute appendicitis, diverticulitis, urinary tract infection/pyelonephritis, endometriosis, bowel obstruction,  colitis, renal colic, gastroenteritis, hernia, fibroids, endometriosis, pregnancy related pain including ectopic pregnancy, etc.  Patient's presentation is most consistent with acute presentation with potential threat to life or bodily function.  Patient's diagnosis is consistent with constipation.  Patient presents to the ED not endorsing any acute abdominal pain or discomfort.  She continued to endorse some emesis, but symptoms are resolved following medication ministration including Reglan and Zofran.  X-ray reviewed by me, reveals a moderate stool burden and evidence of a fecal stool mass in the rectum.  Patient was offered rectal disimpaction manually as well as application of a glycerin suppository.  She declined both at this time.  She is inclined however, to take the MiraLAX as prescribed.  A prescription for the same was provided for her benefit.  Patient is to follow up with her PCP as needed or otherwise directed. Patient is given ED precautions to return to the ED for any worsening or new symptoms.   FINAL CLINICAL IMPRESSION(S) / ED DIAGNOSES   Final diagnoses:  Constipation, unspecified constipation type  Fecal impaction in rectum (HCC)     Rx / DC Orders   ED Discharge Orders          Ordered    polyethylene glycol powder (GLYCOLAX) 17 GM/SCOOP powder  Daily        10/09/23 1908             Note:  This document was prepared using Dragon voice recognition software and may include unintentional dictation errors.    May Sparks, PA-C 10/09/23 1910    Jacquie Maudlin, MD 10/10/23 0020

## 2023-10-09 NOTE — ED Notes (Signed)
 Patient states she has dental problems and that is why she needs an antibiotic. Patient states she is seeing someone on Monday to remove her 3 front bottom teeth, but can't handle the pain until then. Patient is still vomiting small amounts of fluid.

## 2023-10-09 NOTE — ED Notes (Signed)
 Lab couldn't run the UA with the scant amount of urine collected. PA-C aware.

## 2023-10-09 NOTE — ED Notes (Signed)
 Patient's husband is on the phone and states patient is here for constipation.

## 2023-10-09 NOTE — ED Triage Notes (Signed)
 Patient comes in from home via [facility] with complaints of lower abdominal pain and constipation for the past couple of days. Pt was actively vomiting with EMS, and received 4 of zofran in transit. Pt alert, and with no signs of acute distress at this time.

## 2023-10-09 NOTE — ED Notes (Signed)
 Patient states she couldn't void at this time. Patient states she needs an antibiotic.

## 2023-10-28 ENCOUNTER — Telehealth: Payer: Self-pay

## 2023-10-28 NOTE — Telephone Encounter (Signed)
 Luma called [employer] and scheduled a Congregational Nurse Visit for 04/23/20025 (did not come) and then 10/28/2023 (did not come). Client called this afternoon to follow up but unable to leave message-voice box full. Gery Kubas RN

## 2023-11-11 LAB — GLUCOSE, POCT (MANUAL RESULT ENTRY): POC Glucose: 160 mg/dL — AB (ref 70–99)

## 2023-11-11 NOTE — Congregational Nurse Program (Signed)
  Dept: [phone]   Congregational Nurse Program Note  Date of Encounter: 11/11/2023 [facility] [employer] Encounter Details: Client presents to [facility] requesting refill of antibiotic. Client states was prescribed antibiotics for both tooth decay by dentist and left foot big toe infection. States has not been taking antibiotic due to nausea and vomiting with Amoxicillin . Big toe appears swollen; without drainage; nail intact. Tooth decay evident upon exam.   Requests assistant to pay for cataract surgery. Client provided paper records from [facility] on 01/09/2023.  She prefers to have surgery at [facility]. Client states she needs a follow up appointment at [facility] however unable to pay the "..copay of $20. Recommended client schedule eye exam f/u appointment first week of June at [facility] since she stated she will have money at the end of the month and would be able to pay the $20 then. Unable to contact [facility] while client at [facility]. Client states she will call them when she gets home.    Client also states she needs a walker and had lost the walker she was given. Patient's gait unsteady at times, assistance needed to bring food donation to car.  [facility] provided transportation to and from the [employer] today.  Client requesting glucose monitoring strips for [employer] Autocode Glucometer because she is unable to pay the $54 to purchase at [facility]. Client states she was instructed to monitor glucose throughout day (6 times a day) but must use the "Voice' glucometer when significant other is at work.       Client aware of elevated glucose reading today. Recommended avoidance of sugary drinks (juices/sodas). Client currently drinking canned lemonade.   Recommended client schedule an appointment at [facility], her primary care provider. Unable to reach office whil patient at [employer]. Client states she will call [facility]  today when is at home however she has difficulty seeing the phone screen to dial numbers. She states she could ask for her 'husband' to help her make the phone call today. Written instructions given to client of issues she needs to discuss at her primary care appointment.  [facility] called [facility] asking the office to call patient to schedule an appointment. [facility] given client's phone number in EPIC and they agreed to call client.     Gery Kubas RN, [facility]

## 2023-11-18 NOTE — Congregational Nurse Program (Signed)
  Dept: [phone]   Congregational Nurse Program Note  Date of Encounter: 11/18/2023 [employer] representative, [employer] Past Medical History: Past Medical History:  Diagnosis Date   Diabetes mellitus without complication (HCC)    Seizures (HCC) 2005 Last sz   childhood febrile sz, then ?eclampsia    Encounter Details:  Follow Up from Last CN Visit: Client states she has a visit with [facility] next Tuesday. CCNP Referral@ Communication Form given to client that listed her needs/concerns. Recommended she give this next week to provider in order to help her communicate her concerns/needs. Client states she planned on calling [place] for dentist visit however she wanted to have her vision appointments,first (1st week of June).  Phone # to dentist given to client. Assisted client with scheduling [employer] transportation from [place] today. Client has difficulty seeing phone screen and navigating parking lot when ride arrives. Clothing voucher given to [facility] per client's request for shoes/clothing. Gery Kubas RN     Community Questionnaire - 11/18/23 1425       Questionnaire   Ask client: Do you give verbal consent for me to treat you today? Yes    Student Assistance N/A    Location Patient Information systems manager, [employer]    Encounter Setting CN site    [employer] Status Unknown    Insurance Medicare    Insurance/Financial Assistance Referral N/A    Medication N/A    Medical Provider Yes    Screening Referrals Made N/A    Medical Referrals Made [facility] PCP/Clinic    CNP Interventions Advocate/Support;Navigate Healthcare System;Case Management;Counsel;Educate    Screenings CN Performed N/A    ED Visit Averted N/A    Life-Saving Intervention Made N/A

## 2023-12-16 ENCOUNTER — Telehealth: Payer: Self-pay

## 2023-12-16 NOTE — Telephone Encounter (Signed)
 Patient called [employer] asking to speak to [employer]. Client states has an appointment at [facility] 01/19/2024 at 0910 a.m. However [facility] informed her that she no longer can use their service for transportation until January. Called [employer] for client and scheduled pick up. Client approved [employer] as mode of transportation. Gery Kubas RN

## 2023-12-16 NOTE — Congregational Nurse Program (Signed)
  Dept: [phone]   Congregational Nurse Program Note  Date of Encounter: 12/16/2023 Follow Up Phone Call  Past Medical History: Past Medical History:  Diagnosis Date   Diabetes mellitus without complication (HCC)    Seizures (HCC) 2005 Last sz   childhood febrile sz, then ?eclampsia    Encounter Details:  Client called [place], [place]. Assisted patient with transportation via [employer] for July appointment. Gery Kubas RN

## 2024-01-06 NOTE — Congregational Nurse Program (Signed)
  Dept: [phone]   Congregational Nurse Program Note  Date of Encounter: 12/30/2023 CNP Phone Call Past Medical History: Past Medical History:  Diagnosis Date   Diabetes mellitus without complication (HCC)    Seizures (HCC) 2005 Last sz   childhood febrile sz, then ?eclampsia   Encounter Details: Client called Holiday representative, [employer]. States did have appointment at [facility] and she is scheduled for surgery 01/16/2024 however does not have transportation. States significant other can accompany to surgery however he does not have car. Scheduled transportation to and from client's home address and  [facility] [place].   Addendum:  12/31/2023 Obtained signed rider Waiver and Release of Liability.   [name] RN [facility] Congregational Health Nurse

## 2024-01-13 NOTE — Congregational Nurse Program (Signed)
  Dept: [phone]   Congregational Nurse Program Note  Date of Encounter: 01/13/2024 Phone encounter Past Medical History: Past Medical History:  Diagnosis Date   Diabetes mellitus without complication (HCC)    Seizures (HCC) 2005 Last sz   childhood febrile sz, then ?eclampsia   Encounter Details: Client called to confirm upcoming [employer] for Friday 01/15/2024, pickup estimated time 05:05 a.m. @ [address].    Community Questionnaire - 01/13/24 1450       Questionnaire   Ask client: Do you give verbal consent for me to treat you today? Yes    Student Assistance N/A    Location Patient Information systems manager, [employer]    Encounter Setting Phone/Text/Email    Population Status Unknown    Insurance Medicare    Insurance/Financial Assistance Referral N/A    Medication N/A    Medical Provider Yes    Screening Referrals Made N/A    Medical Referrals Made N/A    Medical Appointment Completed N/A    CNP Interventions Advocate/Support;Case Management    Screenings CN Performed N/A    ED Visit Averted N/A    Life-Saving Intervention Made N/A          [name] RN [facility] Congregational Health Nurse

## 2024-01-14 NOTE — Anesthesia Preprocedure Evaluation (Addendum)
 Procedure Information:  Date/Time: 01/15/24 0850   Procedure: VITRECTOMY, MECHANICAL, PARS PLANA APPROACH; WITH ENDOLASER  PANRETINAL PHOTOCOAGULATION (Left)   Anesthesia type: General   Diagnosis: Vitreous hemorrhage of left eye (CMS-HCC) [H43.12]   Pre-op diagnosis: Vitreous hemorrhage of left eye   Location: [facility] PROC 02 / [facility] PROCEDURES AREA   Surgeons: Laurita Cassis, MD     Anesthesia Evaluation     Airway     Dental      Pulmonary - negative ROS   Cardiovascular   ROS comment: 1/22 1. Left ventricular ejection fraction, by estimation, is 60 to 65%. The left ventricle has normal function. The basal inferoseptal LV segment appears aneurysmal. All other LV segments contract normally.   2. Right ventricular systolic function is normal. The right ventricular size is normal.   3. No left atrial/left atrial appendage thrombus was detected.   4. The mitral valve is normal in structure. Trivial mitral valve regurgitation. No evidence of mitral stenosis.   5. The aortic valve is tricuspid. Aortic valve regurgitation is not visualized. No aortic stenosis is present.   6. Agitated saline contrast bubble study was negative, with no evidence of any interatrial shunt.     Neuro/Psych   (+) seizures, headaches  Migraine headaches   GI/Hepatic/Renal - negative ROS    Endo/Other   (+) diabetes mellitus type 2  Abdominal   OB/GYN      HEENT   (+)  visual disturbance                       Anesthesia Plan  ASA 3     Anesthetic:  General  Standard lines and monitors.  Type of induction: IV Airway: laryngeal mask airway                                          Post Procedure Pain Management:oral pain medication and IV analgesics.            Relevant Problems  No relevant active problems

## 2024-01-18 ENCOUNTER — Telehealth: Payer: Self-pay

## 2024-01-18 NOTE — Telephone Encounter (Signed)
 Client was scheduled for transportation to eye surgery 01/15/2024. Called client for follow up. Client states did not have surgery Friday because ride arrived at 5:20 a.m. and pre-op phone call instructed to arrive at hospital at 05:45. Client states she did not accept the ride because she felt that was not enough travel time. Client has phone number to call to reschedule an appointment however she is unable to read written number on paper. Husband at work. Recommended she have husband write down number in larger numbers so she can call office tomorrow. Instructed to notify [facility] of date, time, location of appointment if transportation needs exist.   Client complains of tripping on Friday and injuring foot. Foot swollen and tender to touch. Recommend ice backs and elevate foot when sitting/lying down. Instructed to call CHN with update. [name] RN

## 2024-01-27 ENCOUNTER — Telehealth: Payer: Self-pay

## 2024-01-27 NOTE — Telephone Encounter (Signed)
 Client left message at [employer], [employer] for [employer] to call. Left voice message that client may return phone call today. [name] RN

## 2024-01-27 NOTE — Telephone Encounter (Signed)
 Client states eye surgery scheduled for 02/24/2024 @ [facility] however she does not yet have time of arrival. Client states she does not have transportation to hospital. Plan: follow up with client to determine time of surgery and discuss transportation options. [name] RN

## 2024-02-18 NOTE — Congregational Nurse Program (Signed)
  Dept: [phone]   Congregational Nurse Program Note  Date of Encounter: 02/17/2024  Returned patient phone call; message left at [facility] for nurse to call cell phone.    Nolah aware that Gisele will pick her up at home address; 6:30 a.m. on 02/24/2024 and bring her and her significant other to [facility] for eye surgery. She expressed understanding that she may call Gisele for pick up at discharge.   For questions, please call Support Team at [phone] or email [email or web address].  Past Medical History: Past Medical History:  Diagnosis Date   Diabetes mellitus without complication (HCC)    Seizures (HCC) 2005 Last sz   childhood febrile sz, then ?eclampsia    Encounter Details:  Community Questionnaire - 02/18/24 1629       Questionnaire   Ask client: Do you give verbal consent for me to treat you today? Yes    Student Assistance N/A    Location Patient Information systems manager, [employer]    Encounter Setting Phone/Text/Email    Population Status Unknown    Insurance Medicare    Insurance/Financial Assistance Referral N/A    Medication Patient Medications Reviewed   Patient states currently taking antibiotic   Medical Provider Yes    Screening Referrals Made N/A    Medical Referrals Made N/A    Medical Appointment Completed Vision    CNP Interventions Advocate/Support;Case Management;Educate    Screenings CN Performed N/A    ED Visit Averted N/A    Life-Saving Intervention Made N/A

## 2024-02-23 ENCOUNTER — Encounter: Payer: Self-pay | Admitting: Oncology

## 2024-02-24 ENCOUNTER — Telehealth: Payer: Self-pay

## 2024-02-24 NOTE — Telephone Encounter (Signed)
 Keelynn left message for [employer] Nurse asking advice   how to contact [place] driver for pick up back home from [facility]. Attempted to reach client x 2, no answer left message to call [employer] for pick up at [phone]. Called [facility] to speak to nurse. Patient was discharged due to surgery canceled. [name] RN

## 2024-02-25 LAB — GLUCOSE, POCT (MANUAL RESULT ENTRY): POC Glucose: 260 mg/dL — AB (ref 70–99)

## 2024-02-26 NOTE — Congregational Nurse Program (Signed)
  Dept: [phone]   Congregational Nurse Program Note  Date of Encounter: 02/25/2024  Patient was upset because eye surgery has been delayed due to doctor being sick.  Encouraged patient to call back to get eye surgery rescheduled.  Encouraged patient to follow up with PCP regarding elevated BP and Blood sugar.   Past Medical History: Past Medical History:  Diagnosis Date   Diabetes mellitus without complication (HCC)    Seizures (HCC) 2005 Last sz   childhood febrile sz, then ?eclampsia    Encounter Details:  Community Questionnaire - 02/26/24 0932       Questionnaire   Ask client: Do you give verbal consent for me to treat you today? Yes    Student Assistance N/A    Location Patient [facility]    Encounter Setting CN site    Insurance Medicare    Insurance/Financial Assistance Referral N/A    Medication N/A    Medical Provider Yes    Screening Referrals Made N/A    Medical Referrals Made N/A    Medical Appointment Completed N/A    Screenings CN Performed Blood Pressure;Blood Glucose    ED Visit Averted N/A    Life-Saving Intervention Made N/A          Today's Vitals   02/25/24 1700  BP: (!) 140/90  Pulse: (!) 127  SpO2: 96%   There is no height or weight on file to calculate BMI.

## 2024-03-09 NOTE — Congregational Nurse Program (Signed)
  Dept: [phone]   Congregational Nurse Program Note  Date of Encounter: 03/09/2024 Phone Call  Past Medical History: Past Medical History:  Diagnosis Date   Diabetes mellitus without complication (HCC)    Seizures (HCC) 2005 Last sz   childhood febrile sz, then ?eclampsia  Client c/o of foot pain and edema. Also states she was on an antibiotic in [place]  for dental problems and she is searching for a dentist who accepts [facility]. Client recommended to schedule appointment with  Addendum: Referral created to [facility], pending clients signature.  Appointment scheduled with clients PCP for October 21 st @ 1pm.  Encounter Details:  Community Questionnaire - 03/09/24 1422       Questionnaire   Ask client: Do you give verbal consent for me to treat you today? Yes    Student Assistance N/A    Location Patient Information systems manager, [employer] Other    Population Status Unknown    Insurance Medicare    Insurance/Financial Assistance Referral N/A    Medical Provider Yes    Screening Referrals Made N/A    Medical Referrals Made Dental    Medical Appointment Completed N/A    CNP Interventions Advocate/Support;Navigate Healthcare System;Counsel    Screenings CN Performed N/A    ED Visit Averted N/A    Life-Saving Intervention Made N/A        [name] RN

## 2024-03-16 NOTE — Congregational Nurse Program (Addendum)
 Attempted to return client phone call. No answer; left voice mail to return phone call. Client returned phone call. States she has eye exam appointment at [facility] 03/29/2024. Client states she has transportation arranged for this visit. However if eye surgery is rescheduled she will need transportation to [facility]. Client advised to contact Congregational Nurse with date and time of eye surgery so transportation can be arranged.  Food insecurity continues to be an issue. Obtaining food assistance from [employer]. Client plans to walk to [facility] 03/17/24 to see Congregational Nurse at this location so she can have Dental Referral Form signed. [name] RN

## 2024-03-17 LAB — GLUCOSE, POCT (MANUAL RESULT ENTRY): POC Glucose: 204 mg/dL — AB (ref 70–99)

## 2024-03-18 NOTE — Congregational Nurse Program (Signed)
  Dept: [phone]   Congregational Nurse Program Note  Date of Encounter: 03/17/2024 Pinkey Blackwood Past Medical History: Past Medical History:  Diagnosis Date   Diabetes mellitus without complication (HCC)    Seizures (HCC) 2005 Last sz   childhood febrile sz, then ?eclampsia   Encounter Details:  Met client at [employer] to obtain signature and review medications on the Dental Clinic Patient Referral Form. Faxed completed form to [facility]. Confirmed that practice accepts client's insurance.    Community Questionnaire - 03/17/24 1600       Questionnaire   Ask client: Do you give verbal consent for me to treat you today? Yes    Student Assistance N/A    Location Patient Served  [place]    Encounter Setting CN site    Population Status Unknown    Insurance Medicare    Insurance/Financial Assistance Referral N/A    Medication Patient Medications Reviewed    Medical Provider Yes    Screening Referrals Made Dental    Medical Referrals Made Dental    Medical Appointment Completed N/A    CNP Interventions Advocate/Support    Screenings CN Performed N/A    ED Visit Averted N/A    Life-Saving Intervention Made N/A         [name] RN

## 2024-03-19 NOTE — Congregational Nurse Program (Signed)
  Dept: [phone]   Congregational Nurse Program Note  Date of Encounter: 03/17/2024  Past Medical History: Past Medical History:  Diagnosis Date   Diabetes mellitus without complication (HCC)    Seizures (HCC) 2005 Last sz   childhood febrile sz, then ?eclampsia    Encounter Details:  Community Questionnaire - 03/17/24 1630       Questionnaire   Ask client: Do you give verbal consent for me to treat you today? Yes    Student Assistance N/A    Location Patient Served  [place]    Encounter Setting CN site    Population Status Unknown    Insurance Medicaid;Medicare    Insurance/Financial Assistance Referral N/A    Medication N/A    Medical Provider Yes    Screening Referrals Made N/A    Medical Referrals Made N/A    Medical Appointment Completed N/A    CNP Interventions Advocate/Support    Screenings CN Performed Blood Glucose    ED Visit Averted N/A    Life-Saving Intervention Made N/A

## 2024-04-01 NOTE — Congregational Nurse Program (Signed)
 Client called [facility], Radiation protection practitioner to confirm pick up time for [place] ride to [facility] 10/08/225 Surgery. Client aware of pick up time of 05:30 a.m. at home address. BTHIELE RN

## 2024-04-01 NOTE — Congregational Nurse Program (Deleted)
 Client called [facility], Radiation protection practitioner to confirm pick up time for [place]isele ride to [facility] 10/08/225 Surgery. Client aware of pick up time of 05:30 a.m. at home address. BTHIELE RN

## 2024-04-06 NOTE — Congregational Nurse Program (Signed)
  Dept: [phone]   Congregational Nurse Program Note  Date of Encounter: 04/06/2024 Phone Call/Coordination of Care Past Medical History: Past Medical History:  Diagnosis Date   Diabetes mellitus without complication (HCC)    Seizures (HCC) 2005 Last sz   childhood febrile sz, then ?eclampsia  Encounter Details: Client called [place] Congregational Nurse post op surgery at [facility] instructing [facility] nurse to discuss post op instructions. Per [facility] nurse post op appointment required tomorrow morning at [facility]; medications will be dispensed at this visit. [employer] transportation request submitted for G[place]le ride to this appointment for a 0730 a.m. pick up time since client has no transportation options. [facility] unable to assist with transportation. Client verbalized understanding of pick up time on 10/09. Reinforced post op instructions given by [facility] RN.

## 2024-04-07 LAB — GLUCOSE, POCT (MANUAL RESULT ENTRY): POC Glucose: 127 mg/dL — AB (ref 70–99)

## 2024-04-07 NOTE — Congregational Nurse Program (Signed)
  Dept: [phone]   Congregational Nurse Program Note  Date of Encounter: 04/07/2024  Post surgical eye patch was removed from left eye at 1700 per surgeon instruction to patient.  No redness or swelling noted around the eye only redness inside the sclera of the eye. Patient denied pain.  Patient also asked for help with eye drop and eye ointment medication administration. Patient educated on administration of eye drops and eye ointment.  Patient demonstrated understanding of procedure via teach back method.  Patient encouraged to wear sunglasses provided by surgical center and also encouraged not to rub or touch eye.  Patient educated on when to contact doctor if eye has increasing pain, discharge or significant decrease in vision.  Past Medical History: Past Medical History:  Diagnosis Date   Diabetes mellitus without complication (HCC)    Seizures (HCC) 2005 Last sz   childhood febrile sz, then ?eclampsia    Encounter Details:  Community Questionnaire - 04/07/24 1848       Questionnaire   Ask client: Do you give verbal consent for me to treat you today? Yes    Student Assistance N/A    Location Patient Served  [place]    Encounter Setting CN site    Population Status Unknown    Insurance Medicaid;Medicare    Insurance/Financial Assistance Referral N/A    Medication N/A    Medical Provider Yes    Screening Referrals Made N/A    Medical Referrals Made N/A    Medical Appointment Completed N/A    CNP Interventions Advocate/Support    Screenings CN Performed Blood Pressure;Blood Glucose    ED Visit Averted N/A    Life-Saving Intervention Made N/A          Today's Vitals   04/07/24 1846  BP: 133/81   There is no height or weight on file to calculate BMI.

## 2024-04-14 LAB — GLUCOSE, POCT (MANUAL RESULT ENTRY): POC Glucose: 171 mg/dL — AB (ref 70–99)
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
## 2024-04-20 NOTE — Congregational Nurse Program (Signed)
  Dept: [phone]   Congregational Nurse Program Note  Date of Encounter: 04/20/2024 Phone Call, Follow Up Past Medical History: Past Medical History:  Diagnosis Date   Diabetes mellitus without complication (HCC)    Seizures (HCC) 2005 Last sz   childhood febrile sz, then ?eclampsia  Encounter Details: Client expressed concerns of no transportation to post op eye appointment in [place] 04/21/2024. Confirmed with client that request made to [place] service for a pick up at her home address at 0930 on 10/23. She verbalized understanding that she will need to call for return transportation.   Community Questionnaire - 04/20/24 1530       Questionnaire   Ask client: Do you give verbal consent for me to treat you today? Yes    Student Assistance N/A    Location Patient Information systems manager, [employer]    Encounter Setting CN site;Phone/Text/Email    Population Status Unknown    Insurance Medicaid;Medicare    Insurance/Financial Assistance Referral N/A    Medication N/A    Medical Provider Yes    Screening Referrals Made N/A    Medical Referrals Made N/A    Medical Appointment Completed N/A    CNP Interventions Advocate/Support;Case Management    Screenings CN Performed N/A    ED Visit Averted N/A    Life-Saving Intervention Made N/A         [name] RN

## 2024-04-27 NOTE — Congregational Nurse Program (Signed)
  Dept: [phone]   Congregational Nurse Program Note  Date of Encounter: 04/27/2024 Follow Up -Phone Call Past Medical History: Past Medical History:  Diagnosis Date   Diabetes mellitus without complication (HCC)    Seizures (HCC) 2005 Last sz   childhood febrile sz, then ?eclampsia  Encounter Details:  Community Questionnaire - 04/27/24 1600       Questionnaire   Ask client: Do you give verbal consent for me to treat you today? N/A    Student Assistance N/A    Location Patient Information Systems Manager, [employer]    Encounter Setting Phone/Text/Email    Population Status Unknown    Insurance Medicaid;Medicare    Insurance/Financial Assistance Referral N/A    Medication N/A    Medical Provider Yes    Screening Referrals Made N/A    Medical Referrals Made N/A    Medical Appointment Completed Vision    CNP Interventions N/A    Screenings CN Performed N/A    ED Visit Averted N/A    Life-Saving Intervention Made N/A        BTHIELE RN

## 2024-06-15 NOTE — Congregational Nurse Program (Signed)
°  Dept: [phone]   Congregational Nurse Program Note  Date of Encounter: 06/15/2024 Telephone from client requesting assistance with figuring out new medications for diabetes and toe. States was seen at [facility] for these. Large toe continues to be very painful. I need the nail taken off but they gave me a cream. I don't think it will help. Also states A1C was 8 so Dr increased to 500 mg Metformin  /day. States her glucose 150 today so she's afraid to take that large of a dose. 1. Counseled to call dr if wanting dosage clarified or changed. 2. Discussed importance of following Rx as ordered. 3. Re: foot--co. To soak foot in warm (not hot) water and pat dry before applying ointment as prescribed. (She thinks tid but will double check). If toe gets worse, has green drainage, or doesn't improve with prescribed treatment, follow up with PCP. Client verbalizes understanding. Also states she has been living on the streets but is going to be at [facility] starting tonight. States shoe is too tight on toe. Requests assistance accessing size 8 1/2 wide shoes so she has room for her toe. Rhermann, RN Past Medical History: Past Medical History:  Diagnosis Date   Diabetes mellitus without complication (HCC)    Seizures (HCC) 2005 Last sz   childhood febrile sz, then ?eclampsia    Encounter Details:  Community Questionnaire - 06/15/24 1449       Questionnaire   Ask client: Do you give verbal consent for me to treat you today? Yes    Student Assistance N/A    Location Patient Information Systems Manager, [employer]    Encounter Setting Phone/Text/Email    Population Status Unhoused    Insurance [employer]    Insurance/Financial Assistance Referral N/A    Medication N/A    Medical Provider Yes    Screening Referrals Made N/A    Medical Referrals Made [facility] PCP/Clinic    Medical Appointment Completed Non-[facility] PCP/Clinic    CNP Interventions Navigate Healthcare  System;Educate    Screenings CN Performed N/A    ED Visit Averted N/A    Life-Saving Intervention Made N/A

## 2024-07-13 LAB — GLUCOSE, POCT (MANUAL RESULT ENTRY): POC Glucose: 210 mg/dL — AB (ref 70–99)
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
## 2024-07-14 NOTE — Congregational Nurse Program (Signed)
" °  Dept: [phone]   Congregational Nurse Program Note  Date of Encounter: 07/13/2024 CN Site, [facility] Representative Past Medical History: Past Medical History:  Diagnosis Date   Diabetes mellitus without complication (HCC)    Seizures (HCC) 2005 Last sz   childhood febrile sz, then ?eclampsia  Encounter Details: Client at [employer] this afternoon requesting to see nurse. Desires Health Screening (BP and Glucose), Clothing Voucher and assessment of left toe. Left toe without drainage and redness. Reinforced recommendation from phone conversations early today.  Recommended client take medications as prescribed. Healthy diet discussed-drinking sugary drinks today. Glucose non-fasting.  BTHIELE RN  "

## 2024-08-03 NOTE — Congregational Nurse Program (Signed)
" °  Dept: [phone]   Congregational Nurse Program Note  Date of Encounter: 08/03/2024 Returned client phone call. Client desired to discuss 2 upcoming appointments for foot and eye doctor. Client reminded to follow doctor's orders and take medication as prescribed.  Past Medical History: Past Medical History:  Diagnosis Date   Diabetes mellitus without complication (HCC)    Seizures (HCC) 2005 Last sz   childhood febrile sz, then ?eclampsia  Encounter Details:  Community Questionnaire - 08/03/24 1512       Questionnaire   Ask client: Do you give verbal consent for me to treat you today? Yes    Student Assistance N/A    Location Patient Information Systems Manager, [employer]    Encounter Setting Phone/Text/Email    Population Status Unknown   states staying with friends in [place]   Insurance [place]    Insurance/Financial Assistance Referral N/A    Medication Patient Medications Reviewed    Medical Provider Yes    Medical Referrals Made NA    Medical Appointment Completed Specialty Clinic;[facility] PCP/Clinic   client states [facility] Foot doctor appointment last week -completed   Screenings CN Performed (remember to also record results) NA    CN Interventions Diabetes Education;Advocate/Support;Health Counseling;Navigate Healthcare System;Other Education    ED Visit Averted N/A         [name] RN   "
